# Patient Record
Sex: Female | Born: 1945
Health system: Southern US, Community
[De-identification: ages and names within clinical notes are randomized; demographics above are authoritative.]

## PROBLEM LIST (undated history)

## (undated) DIAGNOSIS — R945 Abnormal results of liver function studies: Secondary | ICD-10-CM

## (undated) DIAGNOSIS — I10 Essential (primary) hypertension: Secondary | ICD-10-CM

## (undated) DIAGNOSIS — R7989 Other specified abnormal findings of blood chemistry: Secondary | ICD-10-CM

## (undated) DIAGNOSIS — E785 Hyperlipidemia, unspecified: Secondary | ICD-10-CM

## (undated) DIAGNOSIS — E119 Type 2 diabetes mellitus without complications: Secondary | ICD-10-CM

## (undated) DIAGNOSIS — I219 Acute myocardial infarction, unspecified: Secondary | ICD-10-CM

## (undated) DIAGNOSIS — D649 Anemia, unspecified: Secondary | ICD-10-CM

## (undated) DIAGNOSIS — I82409 Acute embolism and thrombosis of unspecified deep veins of unspecified lower extremity: Secondary | ICD-10-CM

## (undated) DIAGNOSIS — R413 Other amnesia: Secondary | ICD-10-CM

## (undated) DIAGNOSIS — I2510 Atherosclerotic heart disease of native coronary artery without angina pectoris: Secondary | ICD-10-CM

## (undated) HISTORY — DX: Acute embolism and thrombosis of unspecified deep veins of unspecified lower extremity: I82.409

## (undated) HISTORY — DX: Other specified abnormal findings of blood chemistry: R79.89

## (undated) HISTORY — DX: Other amnesia: R41.3

## (undated) HISTORY — DX: Essential (primary) hypertension: I10

## (undated) HISTORY — DX: Anemia, unspecified: D64.9

## (undated) HISTORY — DX: Abnormal results of liver function studies: R94.5

## (undated) HISTORY — DX: Hyperlipidemia, unspecified: E78.5

## (undated) HISTORY — PX: CATARACT EXTRACTION: SUR2

## (undated) HISTORY — DX: Type 2 diabetes mellitus without complications: E11.9

## (undated) HISTORY — DX: Atherosclerotic heart disease of native coronary artery without angina pectoris: I25.10

---

## 2003-04-26 ENCOUNTER — Ambulatory Visit (HOSPITAL_COMMUNITY): Admission: RE | Admit: 2003-04-26 | Discharge: 2003-04-27 | Payer: Self-pay | Admitting: Cardiovascular Disease

## 2003-04-26 HISTORY — PX: CORONARY ANGIOPLASTY WITH STENT PLACEMENT: SHX49

## 2004-02-29 ENCOUNTER — Emergency Department (HOSPITAL_COMMUNITY): Admission: EM | Admit: 2004-02-29 | Discharge: 2004-02-29 | Payer: Self-pay | Admitting: Emergency Medicine

## 2004-03-19 ENCOUNTER — Encounter: Admission: RE | Admit: 2004-03-19 | Discharge: 2004-03-19 | Payer: Self-pay | Admitting: Internal Medicine

## 2005-01-29 ENCOUNTER — Ambulatory Visit (HOSPITAL_COMMUNITY): Admission: RE | Admit: 2005-01-29 | Discharge: 2005-01-29 | Payer: Self-pay | Admitting: *Deleted

## 2010-09-30 ENCOUNTER — Ambulatory Visit: Payer: Self-pay | Admitting: Cardiovascular Disease

## 2011-02-19 ENCOUNTER — Other Ambulatory Visit: Payer: Self-pay | Admitting: Cardiovascular Disease

## 2011-02-19 DIAGNOSIS — E785 Hyperlipidemia, unspecified: Secondary | ICD-10-CM

## 2011-02-19 NOTE — Telephone Encounter (Signed)
Fax received from pharmacy. Refill completed. Jodette Elizah Mierzwa RN  

## 2011-02-26 ENCOUNTER — Other Ambulatory Visit: Payer: Self-pay | Admitting: Internal Medicine

## 2011-02-26 DIAGNOSIS — Z1231 Encounter for screening mammogram for malignant neoplasm of breast: Secondary | ICD-10-CM

## 2011-03-14 ENCOUNTER — Ambulatory Visit: Payer: Self-pay

## 2011-03-21 NOTE — Cardiovascular Report (Signed)
NAMEHUDSON, [name] [name] NO.:  [id number]   MEDICAL RECORD NO.:  [id number]                   PATIENT TYPE:  OIB   LOCATION:  2899                                 FACILITY:  [facility]   PHYSICIAN:  Vesta Mixer, M.D.              DATE OF BIRTH:  1946-08-07   DATE OF PROCEDURE:  04/26/2003  DATE OF DISCHARGE:                              CARDIAC CATHETERIZATION   PROCEDURE:  Left heart catheterization with PTCA and stenting of the left  anterior descending artery as well as PTCA and stenting of the left  circumflex artery.   INDICATIONS:  The patient is a 65 year old female with a recent diagnosis of  coronary artery disease as well as diabetes mellitus.  She was recently  found to have significant coronary artery disease when she presented to the  hospital in [place] with an acute inferior wall myocardial infarction.  She had successful PTCA and stenting of her right coronary artery, but was  found to have significant lesions in the LAD and left circumflex artery.  She returns now for follow-up intervention of these two remaining lesions.   PROCEDURE:  The right femoral artery was easily cannulated using a modified  Seldinger technique.   HEMODYNAMICS:  The left ventricular pressure was 116/3 with an aortic  pressure of 118/61.   ANGIOGRAPHY:  The left main coronary artery is normal.   The left anterior descending artery is fairly normal in the proximal  segment.  The mid segment has a 60-70% diffuse stenosis.  The stenosis is  somewhat improved compared to her acute catheterization one month ago.  The  distal LAD has only minor luminal irregularities, but is basically normal.  There are several diagonal branches which have minor irregularities.  The  first diagonal branch has a 30-40% stenosis which does not appear to  obstruct blood flow.   The left circumflex artery is a moderate sized vessel and gives off a large  obtuse marginal artery.   There is a 70-80% stenosis in the obtuse marginal  artery.   The right coronary artery is widely patent.  The mid RCA stent is widely  patent and there is TIMI grade 3 flow down the right coronary artery.   LEFT VENTRICULOGRAM:  The left ventriculogram was performed in a 30 RAO  position.  It reveals normal left ventricular systolic function.  The  ejection fraction is around 65%.  There is no mitral regurgitation.   ANGIOPLASTY:  The 6-French sheath was traded out for a 7-French sheath.  The  left main was engaged using a Judkins FL3.5 guide.  Weight based Angiomax  was given.   Patriot guidewire was positioned in the distal left anterior descending  artery.  A 3.0 x 20 mm Taxus stent was placed in the mid left anterior  descending artery.  It was deployed at 11 atmospheres for 36 seconds.  This  resulted in  a very nice angiographic result with no evidence of edge  dissection.  There was a nice step up and step down on either side of the  stent.   At this point the balloon and wire were removed.  The wire was placed down  the circumflex obtuse marginal artery.  Following this a 3.0 x 16 mm Taxus  stent was positioned across the stenosis.  It was deployed at 12 atmospheres  for 29 seconds.  This resulted in a very nice angiographic result with TIMI  grade 3 flow.  There were no edge dissections.   COMPLICATIONS:  None.   CONCLUSION:  Successful PTCA and stenting of the left anterior descending as  well as successful PTCA and stenting of the left circumflex artery.  She has  a nice mid term result on her right coronary artery stenting from April.  She will go to the [facility] and we will anticipate discharge tomorrow.                                               Vesta Mixer, M.D.    PJN/MEDQ  D:  04/26/2003  T:  04/26/2003  Job:  [id number]  Gwenith Daily, M.D.  [place]   cc:   Gwenith Daily, M.D.  [employer]

## 2011-03-21 NOTE — Discharge Summary (Signed)
NAMENOEL, [name] NO.:  [id number]   MEDICAL RECORD NO.:  [id number]                   PATIENT TYPE:  OIB   LOCATION:  6533                                 FACILITY:  [facility]   PHYSICIAN:  Vesta Mixer, M.D.              DATE OF BIRTH:  [birth year]   DATE OF ADMISSION:  04/26/2003  DATE OF DISCHARGE:  04/27/2003                                 DISCHARGE SUMMARY   DISCHARGE DIAGNOSES:  1. Coronary artery disease--status post percutaneous transluminal coronary     angioplasty and stenting of the left anterior descending artery and the     left circumflex artery.  2. Recent percutaneous transluminal coronary angioplasty and stenting of the     right coronary artery following an inferior wall myocardial infarction.  3. Recent diagnosis of diabetes mellitus.  4. Dyslipidemia.   DISCHARGE MEDICATIONS:  1. Metoprolol 25 mg p.o. b.i.d.  2. Plavix 75 mg a day.  3. Aspirin 325 mg a day.  4. Tricor 160 mg a day.  5. Insulin as prescribed by Dr. Timothy Lasso.  6. Actos as prescribed by Dr. Timothy Lasso.  7. Nitroglycerin as needed.   DISPOSITION:  The patient will see Dr. Elease Hashimoto in one week.  She is to see  Dr. Timothy Lasso as needed for her diabetes mellitus.   HISTORY:  [name] is a 65 year old female with a recent diagnosis of  diabetes mellitus.  She was visiting in [place], [place] several  months ago and had an acute inferior wall myocardial infarction.  She had  successful PTCA and stenting of her right coronary artery at that time.  She  was found to have a tight left circumflex artery and left anterior  descending artery blockage.  She returned yesterday for followup  interventions.  Please see dictated H&P for further details.   HOSPITAL COURSE:  Coronary artery disease.  The patient was taken to the  catheterization lab and had a successful percutaneous transluminal coronary  angioplasty and stenting of her left anterior descending artery using  a 3.0  x 20 mm TAXUS stent.  She then had successful PTCA and stenting of her left  circumflex artery using a 3.0 x 16 mm TAXUS stent.  She tolerated the  procedure quite well and had 0% residual stenosis.  The postprocedural EKG  was unremarkable.  She did not have any further episodes of chest pain.  She  received Angiomax during the  procedure and had no episodes of bleeding.  She is now discharged in  satisfactory condition.  All of her other medical problems are stable.  We  will be holding her Altace because of some mild hypotension.  We will  address that further as an outpatient.  Vesta Mixer, M.D.    PJN/MEDQ  D:  04/27/2003  T:  04/28/2003  Job:  [id number]   cc:   Gwen Pounds, M.D.  [address]  [place]  [place] [address]  Fax: [phone]    cc:   Gwen Pounds, M.D.  [address]  [place]  [place] [address]  Fax: [phone]

## 2011-03-21 NOTE — H&P (Signed)
NAME:  [name] [name], [name] [name] NO.:  [id number]   MEDICAL RECORD NO.:  [id number]                  PATIENT TYPE:  OIB   LOCATION:                                       FACILITY:  [facility]   PHYSICIAN:  Vesta Mixer, M.D.              DATE OF BIRTH:  01/27/1946   DATE OF ADMISSION:  04/26/2003  DATE OF DISCHARGE:                                HISTORY & PHYSICAL   HISTORY OF PRESENT ILLNESS:  [name] [name] is a middle-aged female with a  recent diagnosis of coronary artery disease.  She was visiting [place] and  had an acute inferior wall myocardial infarction.  She had successful PTCA  and stenting of her right coronary artery.  At that time, she was noted to  have two other significant lesions involving her LAD and circumflex.  She  has done fairly well, but she is now admitted for followup heart  catheterization for further management of her coronary artery disease.   The patient had been in relatively good health.  She has not had any  significant problems until she presented with her acute inferior wall  myocardial infarction.  She has not had any episodes of chest pain or  shortness of breath since that time.  We received the CD-ROM of her heart  catheterization.  It revealed that she did in fact have significant disease  involving her LAD and her circumflex vessel.   CURRENT MEDICATIONS:  1. Enteric coated aspirin 325 mg daily.  2. Plavix 75 mg daily.  3. Metoprolol 50 mg b.i.d.  4. Altace 2.5 mg daily.  5. TriCor 160 mg daily.  6. Actos 30 mg daily.  7. Lantus insulin 18 units q.h.s.   PAST MEDICAL HISTORY:  1. Hypertension.  2. Diabetes mellitus.  3. Hyperlipidemia.   SOCIAL HISTORY:  The patient drinks wine occasionally.  She does not smoke.   FAMILY HISTORY:  Her father died of a myocardial infarction.  Her mother had  two myocardial infarctions, and ended dying of an aneurysm.   REVIEW OF SYSTEMS:  Reviewed and is essentially  negative.   PHYSICAL EXAMINATION:  GENERAL:  She is a middle-aged female in no acute  distress.  She is alert and oriented x3.  Her mood and affect are normal.  VITAL SIGNS:  Her weight is 156.  Her blood pressure is 120/70, heart rate  60.  HEENT:  Two plus carotids.  She has no bruits.  There is no JVD, no  thyromegaly.  LUNGS:  Clear to auscultation.  HEART:  Regular rate, S1 and S2, no murmurs, rubs, or gallops.  ABDOMEN:  Good bowel sounds, nontender.  EXTREMITIES:  No cyanosis, clubbing, or edema.  NEUROLOGIC:  Nonfocal.   Overall, [name] [name] is fairly well.  She does have significant disease  involving her LAD and her left circumflex artery.  We will admit her for  heart catheterization for further evaluation of her LAD and circumflex  vessel.  I suspect that she will need further PTCA and stenting of these  areas.  We had a long discussion with her and her daughter.  She understands  and agrees to proceed.                                               Vesta Mixer, M.D.    PJN/MEDQ  D:  04/24/2003  T:  04/25/2003  Job:  [id number]   cc:   Johnnette Barrios, M.D.  [place], [place]   Jeannett Senior A. Evlyn Kanner, M.D.  [address]  [place]  [place] [address]  Fax: [phone]    cc:   Johnnette Barrios, M.D.  [place], [place]   Jeannett Senior A. Evlyn Kanner, M.D.  [address]  [place]  [place] [address]  Fax: [phone]

## 2011-04-22 ENCOUNTER — Encounter: Payer: Self-pay | Admitting: Cardiovascular Disease

## 2011-04-28 ENCOUNTER — Ambulatory Visit (INDEPENDENT_AMBULATORY_CARE_PROVIDER_SITE_OTHER): Payer: 59 | Admitting: Cardiovascular Disease

## 2011-04-28 ENCOUNTER — Encounter: Payer: Self-pay | Admitting: Cardiovascular Disease

## 2011-04-28 VITALS — BP 140/82 | HR 64 | Ht 62.0 in | Wt 163.4 lb

## 2011-04-28 DIAGNOSIS — I2510 Atherosclerotic heart disease of native coronary artery without angina pectoris: Secondary | ICD-10-CM | POA: Insufficient documentation

## 2011-04-28 NOTE — Progress Notes (Signed)
[name] [name] Date of Birth  05-08-1946 [facility] [address]     [address] [place], [place]  [address] [phone]  Fax  [phone]  History of Present Illness:  Hx of CAD - s/p stenting of LCx.  Hx of hyperlipidemia and HTN.  Under lots of stress with her husband who has bipolar disease.  He has been manic for the past several weeks.  No angina.  No dyspnea.   Current Outpatient Prescriptions on File Prior to Visit  Medication Sig Dispense Refill  . aspirin 325 MG EC tablet Take 325 mg by mouth daily.        . Calcium Carbonate-Vitamin D (CALCIUM + D PO) Take by mouth daily.        . fenofibrate micronized (LOFIBRA) 200 MG capsule TAKE ONE CAPSULE BY MOUTH EVERY DAY  30 capsule  11  . fish oil-omega-3 fatty acids 1000 MG capsule Take 3 g by mouth daily.        . metFORMIN (GLUCOPHAGE) 500 MG tablet Take 500 mg by mouth 2 (two) times daily with a meal.        . metoprolol tartrate (LOPRESSOR) 25 MG tablet Take 25 mg by mouth 2 (two) times daily.        . nitroGLYCERIN (NITROSTAT) 0.4 MG SL tablet Place 0.4 mg under the tongue every 5 (five) minutes as needed.        . rosuvastatin (CRESTOR) 20 MG tablet Take 20 mg by mouth daily.          Allergies  Allergen Reactions  . Codeine   . Ibuprofen   . Latex   . Penicillins     Past Medical History  Diagnosis Date  . Coronary artery disease   . Hyperlipidemia   . Hypertension   . Diabetes mellitus     Past Surgical History  Procedure Date  . Coronary angioplasty with stent placement 04/26/2003    NORMAL. EF 65%  . Cesarean section     History  Smoking status  . Never Smoker   Smokeless tobacco  . Not on file    History  Alcohol Use No    Family History  Problem Relation Age of Onset  . Heart attack Mother     X2  . Aneurysm Mother   . Heart attack Father     Reviw of Systems:  Reviewed in the HPI.  All other systems are negative.  Physical Exam: BP  140/82  Pulse 64  Ht 5\' 2"  (1.575 m)  Wt 163 lb 6.4 oz (74.118 kg)  BMI 29.89 kg/m2 The patient is alert and oriented x 3.  The mood and affect are normal.   Skin: warm and dry.  Color is normal.    HEENT:   the sclera are nonicteric.  The mucous membranes are moist.  The carotids are 2+ without bruits.  There is no thyromegaly.  There is no JVD.    Lungs: clear.  The chest wall is non tender.    Heart: regular rate with a normal S1 and S2.  There are no murmurs, gallops, or rubs. The PMI is not displaced.     Abdomin: good bowel sounds.  There is no guarding or rebound.  There is no hepatosplenomegaly or tenderness.  There are no masses.   Extremities:  no clubbing, cyanosis, or edema.  The legs are without rashes.  The distal pulses are intact.   Neuro:  Cranial nerves  II - XII are intact.  Motor and sensory functions are intact.    The gait is normal.  ECG:  Assessment / Plan:

## 2011-04-28 NOTE — Assessment & Plan Note (Signed)
[name] [name] is doing very well. She's not had any episodes of angina. We'll continue with her same medications.

## 2011-08-05 ENCOUNTER — Other Ambulatory Visit: Payer: Self-pay | Admitting: Cardiovascular Disease

## 2011-11-26 ENCOUNTER — Ambulatory Visit (INDEPENDENT_AMBULATORY_CARE_PROVIDER_SITE_OTHER): Payer: Medicare Other | Admitting: Cardiovascular Disease

## 2011-11-26 ENCOUNTER — Encounter: Payer: Self-pay | Admitting: Cardiovascular Disease

## 2011-11-26 DIAGNOSIS — E785 Hyperlipidemia, unspecified: Secondary | ICD-10-CM | POA: Diagnosis not present

## 2011-11-26 DIAGNOSIS — I10 Essential (primary) hypertension: Secondary | ICD-10-CM | POA: Diagnosis not present

## 2011-11-26 DIAGNOSIS — I2510 Atherosclerotic heart disease of native coronary artery without angina pectoris: Secondary | ICD-10-CM

## 2011-11-26 NOTE — Progress Notes (Signed)
[name] [name] Date of Birth  Mar 01, 1946 [facility]     [place] [facility]  [address]    [address]   [address] [place], [place]  [address]    [place], [place]  [address] [phone]  Fax  [phone]  [phone]  Fax [phone]   History of Present Illness:  Problem list: 1. Coronary artery disease-status post PTCA and stenting of her left complex artery. She status post PTCA and stenting of her right coronary artery in [place] in 2005. 2. Hyperlipidemia 3. Hypertension  [name] [name] is a 66 y.o. female with the above noted hx.  She has had lots of problems with her husband who has bipolar disease ( will not take his meds).  Otherwise she is doing well.   She is still walking some but needs to get back into it  Current Outpatient Prescriptions on File Prior to Visit  Medication Sig Dispense Refill  . aspirin 325 MG EC tablet Take 325 mg by mouth daily.        . Calcium Carbonate-Vitamin D (CALCIUM + D PO) Take by mouth daily.        . CRESTOR 20 MG tablet TAKE 1 TABLET BY MOUTH EVERY DAY  30 tablet  6  . fenofibrate micronized (LOFIBRA) 200 MG capsule TAKE ONE CAPSULE BY MOUTH EVERY DAY  30 capsule  11  . fish oil-omega-3 fatty acids 1000 MG capsule Take 3 g by mouth daily.        . metFORMIN (GLUCOPHAGE) 500 MG tablet Take 500 mg by mouth 2 (two) times daily with a meal.        . metoprolol tartrate (LOPRESSOR) 25 MG tablet Take 25 mg by mouth 2 (two) times daily.        . nitroGLYCERIN (NITROSTAT) 0.4 MG SL tablet Place 0.4 mg under the tongue every 5 (five) minutes as needed.          Allergies  Allergen Reactions  . Codeine   . Ibuprofen   . Latex   . Penicillins     Past Medical History  Diagnosis Date  . Coronary artery disease     s/p stent to LAD and LCx  . Hyperlipidemia   . Hypertension   . Diabetes mellitus     Past Surgical History  Procedure Date  . Coronary angioplasty with stent placement 04/26/2003    NORMAL. EF 65%  . Cesarean  section     History  Smoking status  . Never Smoker   Smokeless tobacco  . Not on file    History  Alcohol Use No    Family History  Problem Relation Age of Onset  . Heart attack Mother     X2  . Aneurysm Mother   . Heart attack Father     Reviw of Systems:  Reviewed in the HPI.  All other systems are negative.  Physical Exam: BP 152/76  Pulse 56  Ht 5\' 2"  (1.575 m)  Wt 161 lb 12.8 oz (73.392 kg)  BMI 29.59 kg/m2 The patient is alert and oriented x 3.  The mood and affect are normal.   Skin: warm and dry.  Color is normal.    HEENT:   Normocephalic/atraumatic. Normal carotids. No JVD. Because membranes are moist. Neck is supple.  Lungs: Lungs are clear to auscultation.   Heart: Regular rate S1-S2.    Abdomen: Her abdomen is benign. She has good bowel sounds. There is no hepatosplenomegaly.  Extremities:  No clubbing cyanosis or edema  Neuro:  Exam.    ECG: Sinus bradycardia. She has low voltage. EKG is otherwise normal.  Assessment / Plan:

## 2011-11-26 NOTE — Patient Instructions (Signed)
Your physician wants you to follow-up in: 6 months  You will receive a reminder letter in the mail two months in advance. If you don't receive a letter, please call our office to schedule the follow-up appointment.  Your physician recommends that you return for a FASTING lipid profile: 6 months   

## 2011-11-26 NOTE — Assessment & Plan Note (Signed)
[name] [name] is doing well. She's not had any episodes of angina. She does have some chest tightness when she walks medical but I think that this is a pulmonary issue. I've asked her to exercise on regular basis. I'll see her again in 6 months.

## 2012-02-24 DIAGNOSIS — I10 Essential (primary) hypertension: Secondary | ICD-10-CM | POA: Diagnosis not present

## 2012-02-24 DIAGNOSIS — M899 Disorder of bone, unspecified: Secondary | ICD-10-CM | POA: Diagnosis not present

## 2012-02-24 DIAGNOSIS — E785 Hyperlipidemia, unspecified: Secondary | ICD-10-CM | POA: Diagnosis not present

## 2012-02-24 DIAGNOSIS — M949 Disorder of cartilage, unspecified: Secondary | ICD-10-CM | POA: Diagnosis not present

## 2012-02-24 DIAGNOSIS — E119 Type 2 diabetes mellitus without complications: Secondary | ICD-10-CM | POA: Diagnosis not present

## 2012-02-24 DIAGNOSIS — R82998 Other abnormal findings in urine: Secondary | ICD-10-CM | POA: Diagnosis not present

## 2012-03-02 DIAGNOSIS — E119 Type 2 diabetes mellitus without complications: Secondary | ICD-10-CM | POA: Diagnosis not present

## 2012-03-02 DIAGNOSIS — Z0000 Encounter for general adult medical examination without abnormal findings: Secondary | ICD-10-CM | POA: Diagnosis not present

## 2012-03-02 DIAGNOSIS — Z1212 Encounter for screening for malignant neoplasm of rectum: Secondary | ICD-10-CM | POA: Diagnosis not present

## 2012-03-02 DIAGNOSIS — M949 Disorder of cartilage, unspecified: Secondary | ICD-10-CM | POA: Diagnosis not present

## 2012-03-02 DIAGNOSIS — M899 Disorder of bone, unspecified: Secondary | ICD-10-CM | POA: Diagnosis not present

## 2012-03-02 DIAGNOSIS — I2510 Atherosclerotic heart disease of native coronary artery without angina pectoris: Secondary | ICD-10-CM | POA: Diagnosis not present

## 2012-03-26 ENCOUNTER — Ambulatory Visit
Admission: RE | Admit: 2012-03-26 | Discharge: 2012-03-26 | Disposition: A | Discharge status: Home / Self Care | Payer: Medicare Other | Source: Ambulatory Visit | Attending: Internal Medicine | Admitting: Internal Medicine

## 2012-03-26 DIAGNOSIS — Z1231 Encounter for screening mammogram for malignant neoplasm of breast: Secondary | ICD-10-CM | POA: Diagnosis not present

## 2012-04-09 ENCOUNTER — Other Ambulatory Visit: Payer: Self-pay | Admitting: *Deleted

## 2012-04-09 ENCOUNTER — Other Ambulatory Visit: Payer: Self-pay | Admitting: Cardiovascular Disease

## 2012-04-09 DIAGNOSIS — E785 Hyperlipidemia, unspecified: Secondary | ICD-10-CM

## 2012-04-09 MED ORDER — FENOFIBRATE MICRONIZED 200 MG PO CAPS: 200.0000 mg | ORAL_CAPSULE | Freq: Every day | ORAL | Status: DC

## 2012-04-09 NOTE — Telephone Encounter (Signed)
Pt walked in requesting fill of fenofibrate, refill completed

## 2012-04-14 DIAGNOSIS — H40019 Open angle with borderline findings, low risk, unspecified eye: Secondary | ICD-10-CM | POA: Diagnosis not present

## 2012-04-14 DIAGNOSIS — H524 Presbyopia: Secondary | ICD-10-CM | POA: Diagnosis not present

## 2012-04-14 DIAGNOSIS — H04129 Dry eye syndrome of unspecified lacrimal gland: Secondary | ICD-10-CM | POA: Diagnosis not present

## 2012-04-14 DIAGNOSIS — IMO0001 Reserved for inherently not codable concepts without codable children: Secondary | ICD-10-CM | POA: Diagnosis not present

## 2012-06-22 ENCOUNTER — Encounter (HOSPITAL_COMMUNITY): Payer: Self-pay | Admitting: Anesthesiology

## 2012-06-22 ENCOUNTER — Other Ambulatory Visit: Payer: Self-pay | Admitting: Orthopedic Surgery

## 2012-06-22 ENCOUNTER — Ambulatory Visit (HOSPITAL_COMMUNITY): Payer: Medicare Other

## 2012-06-22 ENCOUNTER — Encounter (HOSPITAL_COMMUNITY): Payer: Self-pay | Admitting: Emergency Medicine

## 2012-06-22 ENCOUNTER — Encounter (HOSPITAL_COMMUNITY)
Admission: EM | Disposition: A | Discharge status: Home / Self Care | Payer: Self-pay | Source: Home / Self Care | Attending: Orthopedic Surgery

## 2012-06-22 ENCOUNTER — Inpatient Hospital Stay (HOSPITAL_COMMUNITY)
Admission: EM | Admit: 2012-06-22 | Discharge: 2012-06-23 | DRG: 603 | Disposition: A | Discharge status: Home / Self Care | Payer: Medicare Other | Attending: Orthopedic Surgery | Admitting: Orthopedic Surgery

## 2012-06-22 ENCOUNTER — Ambulatory Visit (HOSPITAL_COMMUNITY): Payer: Medicare Other | Admitting: Anesthesiology

## 2012-06-22 DIAGNOSIS — I252 Old myocardial infarction: Secondary | ICD-10-CM | POA: Diagnosis not present

## 2012-06-22 DIAGNOSIS — E119 Type 2 diabetes mellitus without complications: Secondary | ICD-10-CM | POA: Diagnosis present

## 2012-06-22 DIAGNOSIS — R918 Other nonspecific abnormal finding of lung field: Secondary | ICD-10-CM | POA: Diagnosis not present

## 2012-06-22 DIAGNOSIS — E785 Hyperlipidemia, unspecified: Secondary | ICD-10-CM | POA: Diagnosis present

## 2012-06-22 DIAGNOSIS — L02519 Cutaneous abscess of unspecified hand: Secondary | ICD-10-CM | POA: Diagnosis not present

## 2012-06-22 DIAGNOSIS — Z9861 Coronary angioplasty status: Secondary | ICD-10-CM | POA: Diagnosis not present

## 2012-06-22 DIAGNOSIS — I2510 Atherosclerotic heart disease of native coronary artery without angina pectoris: Secondary | ICD-10-CM | POA: Diagnosis present

## 2012-06-22 DIAGNOSIS — I10 Essential (primary) hypertension: Secondary | ICD-10-CM | POA: Diagnosis present

## 2012-06-22 DIAGNOSIS — Z880 Allergy status to penicillin: Secondary | ICD-10-CM

## 2012-06-22 DIAGNOSIS — Z01811 Encounter for preprocedural respiratory examination: Secondary | ICD-10-CM | POA: Diagnosis not present

## 2012-06-22 DIAGNOSIS — Z888 Allergy status to other drugs, medicaments and biological substances status: Secondary | ICD-10-CM

## 2012-06-22 DIAGNOSIS — Z8249 Family history of ischemic heart disease and other diseases of the circulatory system: Secondary | ICD-10-CM

## 2012-06-22 DIAGNOSIS — D72829 Elevated white blood cell count, unspecified: Secondary | ICD-10-CM | POA: Diagnosis not present

## 2012-06-22 DIAGNOSIS — L03119 Cellulitis of unspecified part of limb: Secondary | ICD-10-CM | POA: Diagnosis present

## 2012-06-22 DIAGNOSIS — R509 Fever, unspecified: Secondary | ICD-10-CM | POA: Diagnosis not present

## 2012-06-22 DIAGNOSIS — IMO0002 Reserved for concepts with insufficient information to code with codable children: Secondary | ICD-10-CM | POA: Diagnosis not present

## 2012-06-22 HISTORY — PX: I & D EXTREMITY: SHX5045

## 2012-06-22 HISTORY — DX: Acute myocardial infarction, unspecified: I21.9

## 2012-06-22 LAB — CBC WITH DIFFERENTIAL/PLATELET
Basophils Absolute: 0 10*3/uL (ref 0.0–0.1)
Eosinophils Absolute: 0 10*3/uL (ref 0.0–0.7)
Eosinophils Relative: 0 % (ref 0–5)
HCT: 38.1 % (ref 36.0–46.0)
Lymphocytes Relative: 2 % — ABNORMAL LOW (ref 12–46)
MCV: 93.2 fL (ref 78.0–100.0)
Monocytes Absolute: 1 10*3/uL (ref 0.1–1.0)
Neutro Abs: 17.7 10*3/uL — ABNORMAL HIGH (ref 1.7–7.7)
Neutrophils Relative %: 92 % — ABNORMAL HIGH (ref 43–77)
Platelets: 371 10*3/uL (ref 150–400)
RDW: 14.1 % (ref 11.5–15.5)

## 2012-06-22 LAB — BASIC METABOLIC PANEL
BUN: 17 mg/dL (ref 6–23)
CO2: 24 mEq/L (ref 19–32)
Chloride: 99 mEq/L (ref 96–112)
Creatinine, Ser: 0.78 mg/dL (ref 0.50–1.10)

## 2012-06-22 LAB — SURGICAL PCR SCREEN: Staphylococcus aureus: POSITIVE — AB

## 2012-06-22 LAB — GLUCOSE, CAPILLARY: Glucose-Capillary: 220 mg/dL — ABNORMAL HIGH (ref 70–99)

## 2012-06-22 SURGERY — IRRIGATION AND DEBRIDEMENT EXTREMITY
Anesthesia: General | Site: Hand | Laterality: Left | Wound class: Clean

## 2012-06-22 MED ORDER — FENTANYL CITRATE 0.05 MG/ML IJ SOLN
INTRAMUSCULAR | Status: DC | PRN
  Administered 2012-06-22 (×2): 50 ug via INTRAVENOUS

## 2012-06-22 MED ORDER — SODIUM CHLORIDE 0.9 % IR SOLN
Status: DC | PRN
  Administered 2012-06-22: 1

## 2012-06-22 MED ORDER — ONDANSETRON HCL 4 MG/2ML IJ SOLN: 4.0000 mg | Freq: Once | INTRAMUSCULAR | Status: DC | PRN

## 2012-06-22 MED ORDER — LACTATED RINGERS IV SOLN
INTRAVENOUS | Status: DC
  Administered 2012-06-22: 1000 mL via INTRAVENOUS
  Administered 2012-06-23: 13:00:00 via INTRAVENOUS

## 2012-06-22 MED ORDER — HYDROMORPHONE HCL PF 1 MG/ML IJ SOLN: 0.5000 mg | INTRAMUSCULAR | Status: DC | PRN

## 2012-06-22 MED ORDER — ONDANSETRON HCL 4 MG PO TABS: 4.0000 mg | ORAL_TABLET | Freq: Four times a day (QID) | ORAL | Status: DC | PRN

## 2012-06-22 MED ORDER — ZOLPIDEM TARTRATE 5 MG PO TABS: 5.0000 mg | ORAL_TABLET | Freq: Every evening | ORAL | Status: DC | PRN

## 2012-06-22 MED ORDER — MIDAZOLAM HCL 5 MG/5ML IJ SOLN
INTRAMUSCULAR | Status: DC | PRN
  Administered 2012-06-22: 1 mg via INTRAVENOUS

## 2012-06-22 MED ORDER — METFORMIN HCL 500 MG PO TABS
500.0000 mg | ORAL_TABLET | Freq: Two times a day (BID) | ORAL | Status: DC
  Administered 2012-06-23 (×2): 500 mg via ORAL
  Filled 2012-06-22 (×3): qty 1

## 2012-06-22 MED ORDER — PROPOFOL 10 MG/ML IV BOLUS
INTRAVENOUS | Status: DC | PRN
  Administered 2012-06-22: 110 mg via INTRAVENOUS

## 2012-06-22 MED ORDER — VANCOMYCIN HCL 1000 MG IV SOLR
1000.0000 mg | INTRAVENOUS | Status: DC | PRN
  Administered 2012-06-22: 1000 mg via INTRAVENOUS

## 2012-06-22 MED ORDER — ONDANSETRON HCL 4 MG/2ML IJ SOLN: 4.0000 mg | Freq: Four times a day (QID) | INTRAMUSCULAR | Status: DC | PRN

## 2012-06-22 MED ORDER — METHOCARBAMOL 500 MG PO TABS: 500.0000 mg | ORAL_TABLET | Freq: Four times a day (QID) | ORAL | Status: DC | PRN

## 2012-06-22 MED ORDER — METOPROLOL TARTRATE 12.5 MG HALF TABLET
12.5000 mg | ORAL_TABLET | Freq: Two times a day (BID) | ORAL | Status: DC
  Administered 2012-06-23: 12.5 mg via ORAL
  Filled 2012-06-22 (×2): qty 1

## 2012-06-22 MED ORDER — METHOCARBAMOL 100 MG/ML IJ SOLN
500.0000 mg | Freq: Four times a day (QID) | INTRAVENOUS | Status: DC | PRN
  Filled 2012-06-22: qty 5

## 2012-06-22 MED ORDER — CALCIUM CARBONATE-VITAMIN D 250-125 MG-UNIT PO TABS: 1.0000 | ORAL_TABLET | Freq: Every day | ORAL | Status: DC

## 2012-06-22 MED ORDER — ACETAMINOPHEN 10 MG/ML IV SOLN: 1000.0000 mg | Freq: Once | INTRAVENOUS | Status: DC | PRN

## 2012-06-22 MED ORDER — MUPIROCIN 2 % EX OINT
TOPICAL_OINTMENT | CUTANEOUS | Status: AC
  Administered 2012-06-22: 1 via NASAL
  Filled 2012-06-22: qty 22

## 2012-06-22 MED ORDER — DIPHENHYDRAMINE HCL 12.5 MG/5ML PO ELIX: 12.5000 mg | ORAL_SOLUTION | ORAL | Status: DC | PRN

## 2012-06-22 MED ORDER — MUPIROCIN 2 % EX OINT
TOPICAL_OINTMENT | Freq: Two times a day (BID) | CUTANEOUS | Status: DC
  Administered 2012-06-22: 1 via NASAL
  Filled 2012-06-22: qty 22

## 2012-06-22 MED ORDER — SUCCINYLCHOLINE CHLORIDE 20 MG/ML IJ SOLN
INTRAMUSCULAR | Status: DC | PRN
  Administered 2012-06-22: 100 mg via INTRAVENOUS

## 2012-06-22 MED ORDER — ATORVASTATIN CALCIUM 40 MG PO TABS
40.0000 mg | ORAL_TABLET | Freq: Every day | ORAL | Status: DC
  Administered 2012-06-23: 40 mg via ORAL
  Filled 2012-06-22: qty 1

## 2012-06-22 MED ORDER — BUPIVACAINE HCL (PF) 0.25 % IJ SOLN
INTRAMUSCULAR | Status: DC | PRN
  Administered 2012-06-22: 17 mL

## 2012-06-22 MED ORDER — ENOXAPARIN SODIUM 40 MG/0.4ML ~~LOC~~ SOLN
40.0000 mg | SUBCUTANEOUS | Status: DC
  Administered 2012-06-23: 40 mg via SUBCUTANEOUS
  Filled 2012-06-22: qty 0.4

## 2012-06-22 MED ORDER — HYDROMORPHONE HCL PF 1 MG/ML IJ SOLN: 0.2500 mg | INTRAMUSCULAR | Status: DC | PRN

## 2012-06-22 MED ORDER — ASPIRIN EC 325 MG PO TBEC
325.0000 mg | DELAYED_RELEASE_TABLET | Freq: Every day | ORAL | Status: DC
Start: 2012-06-23 — End: 2012-06-23
  Administered 2012-06-23: 325 mg via ORAL
  Filled 2012-06-22: qty 1

## 2012-06-22 MED ORDER — LIDOCAINE HCL (CARDIAC) 20 MG/ML IV SOLN
INTRAVENOUS | Status: DC | PRN
  Administered 2012-06-22: 30 mg via INTRAVENOUS

## 2012-06-22 MED ORDER — CALCIUM CARBONATE-VITAMIN D 500-200 MG-UNIT PO TABS
0.5000 | ORAL_TABLET | Freq: Every day | ORAL | Status: DC
  Administered 2012-06-23: 0.5 via ORAL
  Filled 2012-06-22: qty 0.5

## 2012-06-22 MED ORDER — NITROGLYCERIN 0.4 MG SL SUBL: 0.4000 mg | SUBLINGUAL_TABLET | SUBLINGUAL | Status: DC | PRN

## 2012-06-22 MED ORDER — SODIUM CHLORIDE 0.9 % IR SOLN
Status: DC | PRN
  Administered 2012-06-22: 3000 mL

## 2012-06-22 MED ORDER — ACETAMINOPHEN 10 MG/ML IV SOLN
INTRAVENOUS | Status: DC | PRN
  Administered 2012-06-22: 1000 mg via INTRAVENOUS

## 2012-06-22 MED ORDER — VANCOMYCIN HCL IN DEXTROSE 1-5 GM/200ML-% IV SOLN
INTRAVENOUS | Status: AC
  Filled 2012-06-22: qty 200

## 2012-06-22 MED ORDER — VANCOMYCIN HCL IN DEXTROSE 1-5 GM/200ML-% IV SOLN: 1000.0000 mg | INTRAVENOUS | Status: DC

## 2012-06-22 MED ORDER — OXYCODONE-ACETAMINOPHEN 5-325 MG PO TABS: 1.0000 | ORAL_TABLET | ORAL | Status: DC | PRN

## 2012-06-22 MED ORDER — ACETAMINOPHEN 10 MG/ML IV SOLN
INTRAVENOUS | Status: AC
  Filled 2012-06-22: qty 100

## 2012-06-22 MED ORDER — INSULIN ASPART 100 UNIT/ML ~~LOC~~ SOLN
0.0000 [IU] | Freq: Three times a day (TID) | SUBCUTANEOUS | Status: DC
  Administered 2012-06-23: 3 [IU] via SUBCUTANEOUS
  Administered 2012-06-23: 2 [IU] via SUBCUTANEOUS
  Administered 2012-06-23: 3 [IU] via SUBCUTANEOUS
  Filled 2012-06-22: qty 3

## 2012-06-22 MED ORDER — OMEGA-3 FATTY ACIDS 1000 MG PO CAPS: 3.0000 g | ORAL_CAPSULE | Freq: Every day | ORAL | Status: DC

## 2012-06-22 MED ORDER — LABETALOL HCL 5 MG/ML IV SOLN
INTRAVENOUS | Status: DC | PRN
  Administered 2012-06-22: 2.5 mg via INTRAVENOUS
  Administered 2012-06-22: 5 mg via INTRAVENOUS

## 2012-06-22 MED ORDER — FENOFIBRATE 160 MG PO TABS
160.0000 mg | ORAL_TABLET | Freq: Every day | ORAL | Status: DC
  Administered 2012-06-23: 160 mg via ORAL
  Filled 2012-06-22: qty 1

## 2012-06-22 MED ORDER — CHLORHEXIDINE GLUCONATE 4 % EX LIQD
60.0000 mL | Freq: Once | CUTANEOUS | Status: AC
  Administered 2012-06-22: 4 via TOPICAL

## 2012-06-22 MED ORDER — BUPIVACAINE HCL (PF) 0.25 % IJ SOLN
INTRAMUSCULAR | Status: AC
  Filled 2012-06-22: qty 30

## 2012-06-22 MED ORDER — SODIUM CHLORIDE 0.9 % IV SOLN
INTRAVENOUS | Status: DC | PRN
  Administered 2012-06-22 (×2): via INTRAVENOUS

## 2012-06-22 SURGICAL SUPPLY — 58 items
BANDAGE COBAN STERILE 2 (GAUZE/BANDAGES/DRESSINGS) IMPLANT
BANDAGE CONFORM 2  STR LF (GAUZE/BANDAGES/DRESSINGS) IMPLANT
BANDAGE ELASTIC 3 VELCRO ST LF (GAUZE/BANDAGES/DRESSINGS) ×2 IMPLANT
BANDAGE ELASTIC 4 VELCRO ST LF (GAUZE/BANDAGES/DRESSINGS) ×2 IMPLANT
BANDAGE GAUZE ELAST BULKY 4 IN (GAUZE/BANDAGES/DRESSINGS) ×2 IMPLANT
BNDG COHESIVE 1X5 TAN STRL LF (GAUZE/BANDAGES/DRESSINGS) IMPLANT
BNDG ESMARK 4X9 LF (GAUZE/BANDAGES/DRESSINGS) IMPLANT
CANISTER SUCTION 2500CC (MISCELLANEOUS) ×2 IMPLANT
CLOTH BEACON ORANGE TIMEOUT ST (SAFETY) ×2 IMPLANT
CORDS BIPOLAR (ELECTRODE) ×2 IMPLANT
COVER SURGICAL LIGHT HANDLE (MISCELLANEOUS) ×2 IMPLANT
CUFF TOURNIQUET SINGLE 18IN (TOURNIQUET CUFF) ×2 IMPLANT
DECANTER SPIKE VIAL GLASS SM (MISCELLANEOUS) ×4 IMPLANT
DRAIN PENROSE 1/4X12 LTX STRL (WOUND CARE) IMPLANT
DRAPE SURG 17X23 STRL (DRAPES) ×2 IMPLANT
DRSG ADAPTIC 3X8 NADH LF (GAUZE/BANDAGES/DRESSINGS) IMPLANT
DRSG EMULSION OIL 3X3 NADH (GAUZE/BANDAGES/DRESSINGS) IMPLANT
DRSG PAD ABDOMINAL 8X10 ST (GAUZE/BANDAGES/DRESSINGS) ×2 IMPLANT
GAUZE PACKING IODOFORM 1/4X5 (PACKING) ×2 IMPLANT
GAUZE XEROFORM 1X8 LF (GAUZE/BANDAGES/DRESSINGS) IMPLANT
GLOVE BIO SURGEON STRL SZ7.5 (GLOVE) IMPLANT
GLOVE BIOGEL PI IND STRL 8 (GLOVE) ×1 IMPLANT
GLOVE BIOGEL PI INDICATOR 8 (GLOVE) ×1
GLOVE SURG SS PI 7.5 STRL IVOR (GLOVE) ×2 IMPLANT
GLOVE SURG SS PI 8.5 STRL IVOR (GLOVE) ×2
GLOVE SURG SS PI 8.5 STRL STRW (GLOVE) ×2 IMPLANT
GOWN STRL REIN XL XLG (GOWN DISPOSABLE) ×2 IMPLANT
HANDPIECE INTERPULSE COAX TIP (DISPOSABLE)
KIT BASIN OR (CUSTOM PROCEDURE TRAY) ×2 IMPLANT
KIT ROOM TURNOVER OR (KITS) ×2 IMPLANT
LOOP VESSEL MAXI BLUE (MISCELLANEOUS) IMPLANT
LOOP VESSEL MINI RED (MISCELLANEOUS) IMPLANT
MANIFOLD NEPTUNE II (INSTRUMENTS) IMPLANT
NEEDLE HYPO 25GX1X1/2 BEV (NEEDLE) ×2 IMPLANT
NEEDLE HYPO 25X1 1.5 SAFETY (NEEDLE) IMPLANT
NS IRRIG 1000ML POUR BTL (IV SOLUTION) ×2 IMPLANT
PACK ORTHO EXTREMITY (CUSTOM PROCEDURE TRAY) ×2 IMPLANT
PAD ARMBOARD 7.5X6 YLW CONV (MISCELLANEOUS) ×4 IMPLANT
SCRUB BETADINE 4OZ XXX (MISCELLANEOUS) ×2 IMPLANT
SET CYSTO W/LG BORE CLAMP LF (SET/KITS/TRAYS/PACK) ×2 IMPLANT
SET HNDPC FAN SPRY TIP SCT (DISPOSABLE) IMPLANT
SOLUTION BETADINE 4OZ (MISCELLANEOUS) ×2 IMPLANT
SPONGE GAUZE 4X4 12PLY (GAUZE/BANDAGES/DRESSINGS) ×2 IMPLANT
SPONGE LAP 18X18 X RAY DECT (DISPOSABLE) IMPLANT
SPONGE LAP 4X18 X RAY DECT (DISPOSABLE) ×2 IMPLANT
SUCTION FRAZIER TIP 10 FR DISP (SUCTIONS) ×2 IMPLANT
SUT ETHILON 4 0 PS 2 18 (SUTURE) ×2 IMPLANT
SUT MON AB 5-0 P3 18 (SUTURE) IMPLANT
SWAB COLLECTION DEVICE MRSA (MISCELLANEOUS) ×2 IMPLANT
SYR CONTROL 10ML LL (SYRINGE) ×2 IMPLANT
TOWEL OR 17X24 6PK STRL BLUE (TOWEL DISPOSABLE) ×2 IMPLANT
TOWEL OR 17X26 10 PK STRL BLUE (TOWEL DISPOSABLE) ×2 IMPLANT
TUBE ANAEROBIC SPECIMEN COL (MISCELLANEOUS) ×4 IMPLANT
TUBE CONNECTING 12X1/4 (SUCTIONS) ×2 IMPLANT
TUBE FEEDING 5FR 15 INCH (TUBING) IMPLANT
UNDERPAD 30X30 INCONTINENT (UNDERPADS AND DIAPERS) ×2 IMPLANT
WATER STERILE IRR 1000ML POUR (IV SOLUTION) ×2 IMPLANT
YANKAUER SUCT BULB TIP NO VENT (SUCTIONS) ×2 IMPLANT

## 2012-06-22 NOTE — Anesthesia Postprocedure Evaluation (Signed)
  Anesthesia Post-op Note  Patient: [name] [name]  Procedure(s) Performed: Procedure(s) (LRB): IRRIGATION AND DEBRIDEMENT EXTREMITY (Left)  Patient Location: PACU  Anesthesia Type: General  Level of Consciousness: awake, alert  and oriented  Airway and Oxygen Therapy: Patient Spontanous Breathing and Patient connected to nasal cannula oxygen  Post-op Pain: mild  Post-op Assessment: Post-op Vital signs reviewed and Patient's Cardiovascular Status Stable  Post-op Vital Signs: stable  Complications: No apparent anesthesia complications

## 2012-06-22 NOTE — Transfer of Care (Signed)
Immediate Anesthesia Transfer of Care Note  Patient: [name] [name]  Procedure(s) Performed: Procedure(s) (LRB): IRRIGATION AND DEBRIDEMENT EXTREMITY (Left)  Patient Location: PACU  Anesthesia Type: General  Level of Consciousness: sedated  Airway & Oxygen Therapy: Patient Spontanous Breathing and Patient connected to nasal cannula oxygen  Post-op Assessment: Report given to PACU RN and Post -op Vital signs reviewed and stable  Post vital signs: Reviewed and stable  Complications: No apparent anesthesia complications

## 2012-06-22 NOTE — Op Note (Signed)
Dictation [phone]

## 2012-06-22 NOTE — Anesthesia Preprocedure Evaluation (Addendum)
Anesthesia Evaluation  Patient identified by MRN, date of birth, ID band Patient awake    Reviewed: Allergy & Precautions, NPO status , Patient's Chart, lab work & pertinent test results, reviewed documented beta blocker date and time   Airway Mallampati: III TM Distance: >3 FB Neck ROM: Full    Dental  (+) Teeth Intact and Dental Advisory Given   Pulmonary  breath sounds clear to auscultation        Cardiovascular hypertension, Pt. on home beta blockers and Pt. on medications + CAD, + Past MI and + Cardiac Stents Rhythm:Regular Rate:Normal     Neuro/Psych    GI/Hepatic   Endo/Other  Type 2, Oral Hypoglycemic Agents  Renal/GU      Musculoskeletal   Abdominal   Peds  Hematology   Anesthesia Other Findings Erythema and edema of L. Hand  Reproductive/Obstetrics                          Anesthesia Physical Anesthesia Plan  ASA: III  Anesthesia Plan: General   Post-op Pain Management:    Induction: Intravenous  Airway Management Planned: LMA  Additional Equipment:   Intra-op Plan:   Post-operative Plan:   Informed Consent: I have reviewed the patients History and Physical, chart, labs and discussed the procedure including the risks, benefits and alternatives for the proposed anesthesia with the patient or authorized representative who has indicated his/her understanding and acceptance.   Dental advisory given  Plan Discussed with: CRNA and Surgeon  Anesthesia Plan Comments: (Infection L. Hand Type 2 DM glucose 202 htn H/O CAD S/P PTCA with stents of RCA, LAD, and LCx in 2004 no angina clinically stable ECG OK 11/26/11  Plan GA with LMA)        Anesthesia Quick Evaluation

## 2012-06-22 NOTE — H&P (Signed)
[name] [name] is an 66 y.o. female.   Chief Complaint: left hand infection HPI: 66 yo rhd female states she had rash on left hand x 1-2 weeks.  Woke today with swelling and erythema and pain in hand.  Remembers no wounds or injury to hand.  Fevers and chills.  Past Medical History  Diagnosis Date  . Coronary artery disease     s/p stent to LAD and LCx  . Hyperlipidemia   . Hypertension   . Diabetes mellitus   . Myocardial infarction     7 yrs ago    Past Surgical History  Procedure Date  . Coronary angioplasty with stent placement 04/26/2003    NORMAL. EF 65%  . Cesarean section     Family History  Problem Relation Age of Onset  . Heart attack Mother     X2  . Aneurysm Mother   . Heart attack Father    Social History:  reports that she has never smoked. She does not have any smokeless tobacco history on file. She reports that she does not drink alcohol or use illicit drugs.  Allergies:  Allergies  Allergen Reactions  . Codeine Other (See Comments)    unknown  . Ibuprofen Other (See Comments)    unknown  . Latex Other (See Comments)    unknown  . Penicillins Other (See Comments)    unknown    Medications Prior to Admission  Medication Sig Dispense Refill  . aspirin 325 MG EC tablet Take 325 mg by mouth daily.        . Calcium Carbonate-Vitamin D (CALCIUM + D PO) Take 1 tablet by mouth daily.       . fenofibrate micronized (LOFIBRA) 200 MG capsule Take 1 capsule (200 mg total) by mouth daily before breakfast.  30 capsule  11  . fish oil-omega-3 fatty acids 1000 MG capsule Take 3 g by mouth daily.        . metFORMIN (GLUCOPHAGE) 500 MG tablet Take 500 mg by mouth 2 (two) times daily with a meal.        . metoprolol tartrate (LOPRESSOR) 25 MG tablet Take 12.5 mg by mouth 2 (two) times daily.       . nitroGLYCERIN (NITROSTAT) 0.4 MG SL tablet Place 0.4 mg under the tongue every 5 (five) minutes as needed. For chest pain      . rosuvastatin (CRESTOR) 20 MG tablet Take  20 mg by mouth daily.        Results for orders placed during the hospital encounter of 06/22/12 (from the past 48 hour(s))  CBC WITH DIFFERENTIAL     Status: Abnormal   Collection Time   06/22/12  6:04 PM      Component Value Range Comment   WBC 19.2 (*) 4.0 - 10.5 K/uL    RBC 4.09  3.87 - 5.11 MIL/uL    Hemoglobin 13.1  12.0 - 15.0 g/dL    HCT 16.1  09.6 - 04.5 %    MCV 93.2  78.0 - 100.0 fL    MCH 32.0  26.0 - 34.0 pg    MCHC 34.4  30.0 - 36.0 g/dL    RDW 40.9  81.1 - 91.4 %    Platelets 371  150 - 400 K/uL    Neutrophils Relative 92 (*) 43 - 77 %    Neutro Abs 17.7 (*) 1.7 - 7.7 K/uL    Lymphocytes Relative 2 (*) 12 - 46 %    Lymphs Abs  0.4 (*) 0.7 - 4.0 K/uL    Monocytes Relative 5  3 - 12 %    Monocytes Absolute 1.0  0.1 - 1.0 K/uL    Eosinophils Relative 0  0 - 5 %    Eosinophils Absolute 0.0  0.0 - 0.7 K/uL    Basophils Relative 0  0 - 1 %    Basophils Absolute 0.0  0.0 - 0.1 K/uL     No results found.   A comprehensive review of systems was negative except for: Constitutional: positive for chills and fevers  Blood pressure 145/69, pulse 105, temperature 103.5 F (39.7 C), temperature source Oral, resp. rate 16, height 5\' 2"  (1.575 m), weight 72.576 kg (160 lb), SpO2 95.00%.  General appearance: alert, cooperative and appears stated age Head: Normocephalic, without obvious abnormality, atraumatic Neck: supple, symmetrical, trachea midline Resp: clear to auscultation bilaterally Cardio: regular rate and rhythm GI: soft, non-tender; bowel sounds normal; no masses,  no organomegaly Extremities: light touch sensation and capillary refill intact all digits.  +epl/fpl/io.  swelling, erythema, pustules on dorsum of left hand, index and long fingers.  streak into forearm. Pulses: 2+ and symmetric Skin: as above Neurologic: Grossly normal Incision/Wound: Small area of serous drainage between index and long fingers  Assessment/Plan Left hand infection.   Recommend OR  for irrigation and debridement.  Risks, benefits, and alternatives of surgery were discussed and the patient agrees with the plan of care.   Kiyomi Pallo R 06/22/2012, 7:46 PM

## 2012-06-22 NOTE — Anesthesia Procedure Notes (Signed)
Procedure Name: Intubation Date/Time: 06/22/2012 8:26 PM Performed by: Alanda Amass A Pre-anesthesia Checklist: Patient identified, Timeout performed, Emergency Drugs available, Suction available and Patient being monitored Patient Re-evaluated:Patient Re-evaluated prior to inductionOxygen Delivery Method: Circle system utilized Preoxygenation: Pre-oxygenation with 100% oxygen Intubation Type: IV induction, Rapid sequence and Cricoid Pressure applied Grade View: Grade I Tube type: Oral Tube size: 7.5 mm Number of attempts: 1 Airway Equipment and Method: Video-laryngoscopy and Stylet Placement Confirmation: ETT inserted through vocal cords under direct vision,  breath sounds checked- equal and bilateral and positive ETCO2 Secured at: 21 cm Dental Injury: Teeth and Oropharynx as per pre-operative assessment  Difficulty Due To: Difficulty was anticipated

## 2012-06-22 NOTE — ED Notes (Addendum)
Pt here with left hand redness and swelling with streaking up left arm; pt sent from PCP; pt with left leg swelling; pt poor historian

## 2012-06-23 LAB — CBC
HCT: 35.4 % — ABNORMAL LOW (ref 36.0–46.0)
MCHC: 33.6 g/dL (ref 30.0–36.0)
MCV: 95.2 fL (ref 78.0–100.0)
Platelets: 295 10*3/uL (ref 150–400)
RBC: 3.72 MIL/uL — ABNORMAL LOW (ref 3.87–5.11)
RDW: 14.5 % (ref 11.5–15.5)
WBC: 18.1 10*3/uL — ABNORMAL HIGH (ref 4.0–10.5)
WBC: 13.1 10*3/uL — ABNORMAL HIGH (ref 4.0–10.5)

## 2012-06-23 LAB — GLUCOSE, CAPILLARY: Glucose-Capillary: 147 mg/dL — ABNORMAL HIGH (ref 70–99)

## 2012-06-23 MED ORDER — OXYCODONE-ACETAMINOPHEN 5-325 MG PO TABS: ORAL_TABLET | ORAL | Status: DC

## 2012-06-23 MED ORDER — BACITRACIN-NEOMYCIN-POLYMYXIN OINTMENT TUBE
TOPICAL_OINTMENT | Freq: Every day | CUTANEOUS | Status: DC
  Administered 2012-06-23: 17:00:00 via TOPICAL
  Filled 2012-06-23: qty 15

## 2012-06-23 MED ORDER — SULFAMETHOXAZOLE-TRIMETHOPRIM 800-160 MG PO TABS: 1.0000 | ORAL_TABLET | Freq: Two times a day (BID) | ORAL | Status: DC

## 2012-06-23 MED ORDER — OMEGA-3-ACID ETHYL ESTERS 1 G PO CAPS
3.0000 g | ORAL_CAPSULE | Freq: Every day | ORAL | Status: DC
  Administered 2012-06-23: 3 g via ORAL
  Filled 2012-06-23: qty 3

## 2012-06-23 MED ORDER — VANCOMYCIN HCL 1000 MG IV SOLR
750.0000 mg | Freq: Two times a day (BID) | INTRAVENOUS | Status: DC
  Administered 2012-06-23: 750 mg via INTRAVENOUS
  Filled 2012-06-23 (×2): qty 750

## 2012-06-23 NOTE — Discharge Summary (Signed)
  Dictation [phone]

## 2012-06-23 NOTE — Op Note (Signed)
[name], INSCO NO.:  [id number]  MEDICAL RECORD NO.:  [id number]  LOCATION:  5N05C                        FACILITY:  [facility]  PHYSICIAN:  Betha Loa, MD        DATE OF BIRTH:  27-Nov-1945  DATE OF PROCEDURE:  06/22/2012 DATE OF DISCHARGE:                              OPERATIVE REPORT   PREOPERATIVE DIAGNOSIS:  Left hand abscess.  POSTOPERATIVE DIAGNOSIS:  Left hand abscess.  PROCEDURE:  Irrigation and debridement, left hand abscess.  SURGEON:  Betha Loa, MD  ASSISTANT:  None.  ANESTHESIA:  General.  IV FLUIDS:  Per anesthesia flow sheet.  ESTIMATED BLOOD LOSS:  Minimal.  COMPLICATIONS:  None.  SPECIMENS:  Cultures to micro.  TOURNIQUET TIME:  39 minutes.  DISPOSITION:  Stable to PACU.  INDICATIONS:  [name] [name] is a 66 year old right-hand dominant female who had noticed a rash on her left hand for a week or 2.  She states she woke up this morning and then the hand was swollen, erythematous and painful.  She was seen by her primary care physician and consulted me for further management.  She was noted to have a fever of 102 and a [place] count of 16[address].  On examination, she had intact sensation, capillary refill in all fingertips.  She had a fluctuant area on the dorsum of the hand and in the proximal aspect of the index and long fingers on the dorsum.  She is not tender volarly.  There was a streak going up the dorsum of her forearm.  I recommended [name] [name] going to the operating room for irrigation and debridement of the hand.  Risks, benefits, and alternatives of surgery were discussed including risk of blood loss, infection, damage to nerves, vessels, tendons, ligaments, bone; failure of surgery; need for additional surgery, complications with wound healing, continued pain, continued infection, need for repeat irrigation and debridement.  She voiced understanding of these risks and elected to proceed.  OPERATIVE COURSE:  After being  identified preoperatively by myself, the patient and I agreed upon procedure and site procedure.  The surgical site was marked.  The risks, benefits, and alternatives of surgery were reviewed and she wished to proceed.  Surgical consent had been signed. She was transferred to the operating room, placed on the operating room table in supine position with left upper extremity on an arm board. General anesthesia was induced by anesthesiologist.  The left upper extremity was prepped and draped in normal sterile orthopedic fashion. Surgical pause was performed between surgeons, anesthesia and operating staff, and all were in agreement as to the patient, procedure, and site of procedure.  Tourniquet at the proximal aspect of the extremity was inflated to 250 mmHg after exsanguination of the limb by gravity.  An incision was made on the dorsum of the hand over the proximal phalanx, the index finger and between the index and long finger metacarpals. This was carried into subcutaneous tissues by spreading technique. There was gross purulence.  There is also a lot of edema.  Cultures were taken for aerobes, anaerobes, AFB, fungus and Gram stain.  The fat in the area was necrotic and friable.  This was removed using  a combination of rongeurs and knife and curette.  The wound coursed over the ring finger metacarpal and back to the base of the metacarpals.  Also tracked into the dorsal aspect of the index and long finger over the proximal phalanx.  The wound was copiously irrigated with 1500 mL of sterile saline by cysto tubing.  It was then re-examined.  The area under the tendons was examined and there was no purulence deep to the tendons. The area was re-debrided.  Again, another 1500 mL of sterile saline by cysto tubing was washed through the wound.  The wound appeared much improved.  It was packed with quarter-inch iodoform gauze.  A 17 mL of 0.25% plain Marcaine were injected in the skin edges to  aid in postoperative analgesia.  The wound was then dressed with sterile Xeroform, 4x4s, and ABD and wrapped with Kerlix.  A volar splint was placed and wrapped with Kerlix and Ace bandage.  Tourniquet was deflated at 39 minutes.  The fingertips were pink with brisk capillary refill after deflation of tourniquet.  Operative drapes were broken down.  The patient was awoken from anesthesia safely.  She was transferred back to stretcher and taken to PACU in stable condition.  She will be admitted for IV antibiotics.  We will recheck her tomorrow.     Betha Loa, MD     KK/MEDQ  D:  06/22/2012  T:  06/23/2012  Job:  [id number]

## 2012-06-23 NOTE — Progress Notes (Signed)
Subjective: 1 Day Post-Op Procedure(s) (LRB): IRRIGATION AND DEBRIDEMENT EXTREMITY (Left) Patient reports pain as describes burning sensation.  almost no pain.  has not requested any narcotic pain meds.  feels much better..    Objective: Vital signs in last 24 hours: Temp:  [97.2 F (36.2 C)-103.5 F (39.7 C)] 100.3 F (37.9 C) (08/21 0558) Pulse Rate:  [84-115] 84  (08/21 0558) Resp:  [16-21] 20  (08/21 0558) BP: (120-146)/(43-69) 123/43 mmHg (08/21 0558) SpO2:  [95 %-100 %] 100 % (08/21 0558) FiO2 (%):  [28 %] 28 % (08/20 2357) Weight:  [72.576 kg (160 lb)] 72.576 kg (160 lb) (08/20 1900)  Intake/Output from previous day: 08/20 0701 - 08/21 0700 In: 1100 [I.V.:1100] Out: 15 [Blood:15] Intake/Output this shift: Total I/O In: 480 [P.O.:480] Out: -    Basename 06/23/12 0605 06/22/12 1804  HGB 11.9* 13.1    Basename 06/23/12 0605 06/22/12 1804  WBC 18.1* 19.2*  RBC 3.72* 4.09  HCT 35.4* 38.1  PLT 280 371    Basename 06/22/12 1804  NA 137  K 4.3  CL 99  CO2 24  BUN 17  CREATININE 0.78  GLUCOSE 210*  CALCIUM 10.2   No results found for this basename: LABPT:2,INR:2 in the last 72 hours  light touch sensation and capillary refill intact all digits.  wiggles fingers and thumb.  streak at proximal forearm resolved.  dressing clean, dry, intact  Assessment/Plan: 1 Day Post-Op Procedure(s) (LRB): IRRIGATION AND DEBRIDEMENT EXTREMITY (Left) Feels and looks much better.  Sitting up in bed eating at time of visit.  States last time her temp was taken it was ~99.  Would like to go home.  Will check CBC this evening and if improved and afebrile, may be able to go home tonight.    Brenna Friesenhahn R 06/23/2012, 11:53 AM

## 2012-06-23 NOTE — Progress Notes (Signed)
Utilization review completed. Tarina Volk, RN, BSN. 

## 2012-06-23 NOTE — Progress Notes (Signed)
ANTIBIOTIC CONSULT NOTE - INITIAL  Pharmacy Consult for vancomycin Indication: ?cellulitis s/p I&D  Allergies  Allergen Reactions  . Codeine Other (See Comments)    unknown  . Ibuprofen Other (See Comments)    unknown  . Latex Other (See Comments)    unknown  . Penicillins Other (See Comments)    unknown    Patient Measurements: Height: 5\' 2"  (157.5 cm) Weight: 160 lb (72.576 kg) IBW/kg (Calculated) : 50.1   Vital Signs: Temp: 100.4 F (38 C) (08/20 2245) Temp src: Oral (08/20 1900) BP: 140/67 mmHg (08/20 2130) Pulse Rate: 95  (08/20 2130) Intake/Output from previous day: 08/20 0701 - 08/21 0700 In: 1100 [I.V.:1100] Out: 15 [Blood:15] Intake/Output from this shift: Total I/O In: 1100 [I.V.:1100] Out: 15 [Blood:15]  Labs:  [facility] 06/22/12 1804  WBC 19.2*  HGB 13.1  PLT 371  LABCREA --  CREATININE 0.78   Estimated Creatinine Clearance: 64.5 ml/min (by C-G formula based on Cr of 0.78). No results found for this basename: VANCOTROUGH:2,VANCOPEAK:2,VANCORANDOM:2,GENTTROUGH:2,GENTPEAK:2,GENTRANDOM:2,TOBRATROUGH:2,TOBRAPEAK:2,TOBRARND:2,AMIKACINPEAK:2,AMIKACINTROU:2,AMIKACIN:2, in the last 72 hours   Microbiology: Recent Results (from the past 720 hour(s))  SURGICAL PCR SCREEN     Status: Abnormal   Collection Time   06/22/12  7:14 PM      Component Value Range Status Comment   MRSA, PCR NEGATIVE  NEGATIVE Final    Staphylococcus aureus POSITIVE (*) NEGATIVE Final     Medical History: Past Medical History  Diagnosis Date  . Coronary artery disease     s/p stent to LAD and LCx  . Hyperlipidemia   . Hypertension   . Diabetes mellitus   . Myocardial infarction     7 yrs ago    Medications:  Prescriptions prior to admission  Medication Sig Dispense Refill  . aspirin 325 MG EC tablet Take 325 mg by mouth daily.        . Calcium Carbonate-Vitamin D (CALCIUM + D PO) Take 1 tablet by mouth daily.       . fenofibrate micronized (LOFIBRA) 200 MG capsule  Take 1 capsule (200 mg total) by mouth daily before breakfast.  30 capsule  11  . fish oil-omega-3 fatty acids 1000 MG capsule Take 3 g by mouth daily.        . metFORMIN (GLUCOPHAGE) 500 MG tablet Take 500 mg by mouth 2 (two) times daily with a meal.        . metoprolol tartrate (LOPRESSOR) 25 MG tablet Take 12.5 mg by mouth 2 (two) times daily.       . nitroGLYCERIN (NITROSTAT) 0.4 MG SL tablet Place 0.4 mg under the tongue every 5 (five) minutes as needed. For chest pain      . rosuvastatin (CRESTOR) 20 MG tablet Take 20 mg by mouth daily.       Assessment: 74 yof presented with left hand infection. She is febrile with a tmax of 103.5 and WBC of 19.2. Now s/p I&D to start empiric vancomycin. She received a 1gm dose pre-op last night.   Goal of Therapy:  Vancomycin trough level 10-15 mcg/ml  Plan:  1. Vancomycin 750mg  IV Q12H 2. F/u renal fxn, C&S and trough at SS 3. F/u LOT  Saagar Tortorella, Drake Leach 06/23/2012,12:19 AM

## 2012-06-23 NOTE — Consult Note (Signed)
WOC consult Note Reason for Consult: eval. Left foot wound.  Pt reports she hit this on the bed frame.  Has hx. Of DM but does not routinely check her blood sugars.  Family at bedside reports she had a wound on the other leg that was difficult to heal, but pt does not recall this.  Wound type:trauma Measurement: 0.5cm x 0.5cm x 0 Wound bed: covered wound with small scab Drainage (amount, consistency, odor) none Periwound: normal erythema at the wound edges consistent with wound healing. No streaking or otherwise indication of infection.  Some minimal pain at site with palpation.  Dressing procedure/placement/frequency: will order triple antibiotic ointment and cover dressing for protection of this area.  Recommended to family and pt to continue to monitor for changes once dc to home.    Re consult if needed, will not follow at this time. Thanks  Eurika Sandy [employer], CWOCN [phone])

## 2012-06-23 NOTE — Progress Notes (Signed)
Subjective: POD #1 of hand infection. 103.5 = Tmax Pt with DM2 and more erratic with infection. Not in pain, No current infection. No CP or SOB  Objective: Vital signs in last 24 hours: Temp:  [97.2 F (36.2 C)-103.5 F (39.7 C)] 100.3 F (37.9 C) (08/21 0558) Pulse Rate:  [84-115] 84  (08/21 0558) Resp:  [16-21] 20  (08/21 0558) BP: (120-146)/(43-69) 123/43 mmHg (08/21 0558) SpO2:  [95 %-100 %] 100 % (08/21 0558) FiO2 (%):  [28 %] 28 % (08/20 2357) Weight:  [72.576 kg (160 lb)] 72.576 kg (160 lb) (08/20 1900) Weight change:  Last BM Date: 06/22/12  CBG (last 3)   Basename 06/23/12 0630 06/22/12 2146 06/22/12 1853  GLUCAP 157* 220* 212*    Intake/Output from previous day:  Intake/Output Summary (Last 24 hours) at 06/23/12 0721 Last data filed at 06/22/12 2136  Gross per 24 hour  Intake   1100 ml  Output     15 ml  Net   1085 ml   08/20 0701 - 08/21 0700 In: 1100 [I.V.:1100] Out: 15 [Blood:15]   Physical Exam  General appearance: A and oriented. Appears tired Eyes: no scleral icterus Throat: oropharynx moist without erythema Resp: CTA Cardio: Reg GI: soft, non-tender; bowel sounds normal; no masses,  no organomegaly Extremities: L hand and arm wrapped  Lab Results:  Basename 06/22/12 1804  NA 137  K 4.3  CL 99  CO2 24  GLUCOSE 210*  BUN 17  CREATININE 0.78  CALCIUM 10.2  MG --  PHOS --    No results found for this basename: AST:2,ALT:2,ALKPHOS:2,BILITOT:2,PROT:2,ALBUMIN:2 in the last 72 hours   Basename 06/23/12 0605 06/22/12 1804  WBC 18.1* 19.2*  NEUTROABS -- 17.7*  HGB 11.9* 13.1  HCT 35.4* 38.1  MCV 95.2 93.2  PLT 280 371    No results found for this basename: INR, PROTIME    No results found for this basename: CKTOTAL:3,CKMB:3,CKMBINDEX:3,TROPONINI:3 in the last 72 hours  No results found for this basename: TSH,T4TOTAL,FREET3,T3FREE,THYROIDAB in the last 72 hours  No results found for this basename:  VITAMINB12:2,FOLATE:2,FERRITIN:2,TIBC:2,IRON:2,RETICCTPCT:2 in the last 72 hours  Micro Results: Recent Results (from the past 240 hour(s))  SURGICAL PCR SCREEN     Status: Abnormal   Collection Time   06/22/12  7:14 PM      Component Value Range Status Comment   MRSA, PCR NEGATIVE  NEGATIVE Final    Staphylococcus aureus POSITIVE (*) NEGATIVE Final   CULTURE, ROUTINE-ABSCESS     Status: Normal (Preliminary result)   Collection Time   06/22/12  8:57 PM      Component Value Range Status Comment   Specimen Description ABSCESS HAND LEFT   Final    Special Requests NONE   Final    Gram Stain PENDING   Incomplete    Culture Culture reincubated for better growth   Final    Report Status PENDING   Incomplete      Studies/Results: Dg Chest Port 1 View  06/22/2012  *RADIOLOGY REPORT*  Clinical Data:  Preop, febrile  PORTABLE CHEST - 1 VIEW  Comparison: None.  Findings: Low inspiratory volumes.  Linear opacities in the bilateral retrocardiac regions are favored to reflect atelectasis. No pulmonary edema.  Cardiac and mediastinal contours are within normal limits.  No acute osseous findings.  IMPRESSION:  Low inspiratory volumes with bibasilar linear opacities favored to reflect atelectasis.  Infiltrate difficult to exclude on a single frontal view.   Original Report Authenticated By: Vilma Prader  Medications: Scheduled:   . aspirin  325 mg Oral Daily  . atorvastatin  40 mg Oral q1800  . calcium-vitamin D  0.5 tablet Oral Daily  . chlorhexidine  60 mL Topical Once  . enoxaparin (LOVENOX) injection  40 mg Subcutaneous Q24H  . fenofibrate  160 mg Oral Daily  . insulin aspart  0-15 Units Subcutaneous TID WC  . metFORMIN  500 mg Oral BID WC  . metoprolol tartrate  12.5 mg Oral BID  . mupirocin ointment   Nasal BID  . omega-3 acid ethyl esters  3 g Oral Daily  . vancomycin  750 mg Intravenous Q12H  . DISCONTD: calcium-vitamin D  1 tablet Oral Daily  . DISCONTD: fish oil-omega-3 fatty acids  3  g Oral Daily  . DISCONTD: vancomycin  1,000 mg Intravenous Q24H   Continuous:   . lactated ringers 1,000 mL (06/22/12 2200)     Assessment/Plan: Active Problems:  * No active hospital problems. *   L Hand Infection/Cellulitis c Fevers and Leukocytosis - POD #1.  On Vanco HTN - BP fine. CAD - no Angina Lipids - on meds.   DM2 - Metformin and ISS.  Cr fine. No changes from my standpoint.  Jello and liquids will elevate CBGs but should be easily controlled.  I believe her last A1C was @ 7.2.  Basename 06/23/12 0630 06/22/12 2146 06/22/12 1853  GLUCAP 157* 220* 212*   ID -  Anti-infectives     Start     Dose/Rate Route Frequency Ordered Stop   06/23/12 1000   vancomycin (VANCOCIN) 750 mg in sodium chloride 0.9 % 150 mL IVPB        750 mg 150 mL/hr over 60 Minutes Intravenous Every 12 hours 06/23/12 0016     06/23/12 0000   vancomycin (VANCOCIN) IVPB 1000 mg/200 mL premix  Status:  Discontinued        1,000 mg 200 mL/hr over 60 Minutes Intravenous Every 24 hours 06/22/12 2357 06/23/12 0004         DVT Prophylaxis -SCDs    LOS: 1 day   Kathrynne Kulinski M 06/23/2012, 7:21 AM

## 2012-06-24 ENCOUNTER — Encounter (HOSPITAL_COMMUNITY): Payer: Self-pay | Admitting: Orthopedic Surgery

## 2012-06-24 DIAGNOSIS — L03119 Cellulitis of unspecified part of limb: Secondary | ICD-10-CM | POA: Diagnosis not present

## 2012-06-24 DIAGNOSIS — L02519 Cutaneous abscess of unspecified hand: Secondary | ICD-10-CM | POA: Diagnosis not present

## 2012-06-24 LAB — GLUCOSE, CAPILLARY

## 2012-06-24 NOTE — Progress Notes (Signed)
Patient discharged at 2045. IV removed. Catheter intact. Discharge instructions explained and given. Prescriptions given. Family present. No questions or concerned. Transported downstairs via w/c per NT.

## 2012-06-24 NOTE — Discharge Summary (Signed)
NAMEKATHYJO, BRIERE NO.:  [id number]  MEDICAL RECORD NO.:  [id number]  LOCATION:  5N05C                        FACILITY:  [facility]  PHYSICIAN:  Betha Loa, MD        DATE OF BIRTH:  10-16-1946  DATE OF ADMISSION:  06/22/2012 DATE OF DISCHARGE:  06/23/2012                              DISCHARGE SUMMARY   FINAL DIAGNOSIS:  Left hand abscess.  PROCEDURE:  Left hand irrigation and debridement.  HISTORY:  Ms. Turvey is a 66 year old female who has noted a pain and swelling in her left hand on the day of admission.  She had fevers and chills.  She presented to her primary care physician and referred to me for further care.  On evaluation, she had a swollen, red hand.  She had tenderness to palpation and fluctuance in the dorsum of the hand.  She had a fever of 102 and a white count of 19,000.  I recommended Ms. Shorb going to the operating for irrigation and debridement of the hand.  Risks, benefits and alternatives of the surgery were discussed including the risk of blood loss; infection; damage to nerves, vessels, tendons, ligaments, bone; failure of procedure; need for additional procedures; complications with wound healing; continued pain; continued infection; need for repeat irrigation and debridement.  She voiced understanding and elected to proceed.  HOSPITAL COURSE:  Ms. Laroque was taken to the operating room on the day of her admission.  Irrigation and debridement of the left hand was performed.  She tolerated the procedure well.  She was well postoperatively.  She has been afebrile all day.  Her white count went from 19 yesterday to 18 this morning and to 13 this evening.  It was felt that she was stable for discharge.  She has required no pain medication.  She will be discharged home with a prescription for Percocet 5/325 1-2 p.o. q.6 hours p.r.n. pain, dispensed #20 and Bactrim DS 1 p.o. b.i.d. x7 days.  She will follow up in the office within next 2  days for hydrotherapy and with me next week.     Betha Loa, MD     KK/MEDQ  D:  06/23/2012  T:  06/24/2012  Job:  [id number]

## 2012-06-25 ENCOUNTER — Encounter (HOSPITAL_COMMUNITY): Payer: Self-pay | Admitting: Adult Health

## 2012-06-25 ENCOUNTER — Emergency Department (HOSPITAL_COMMUNITY)
Admission: EM | Admit: 2012-06-25 | Discharge: 2012-06-26 | Disposition: A | Discharge status: Home / Self Care | Payer: Medicare Other | Attending: Emergency Medicine | Admitting: Emergency Medicine

## 2012-06-25 DIAGNOSIS — E119 Type 2 diabetes mellitus without complications: Secondary | ICD-10-CM | POA: Insufficient documentation

## 2012-06-25 DIAGNOSIS — Z888 Allergy status to other drugs, medicaments and biological substances status: Secondary | ICD-10-CM | POA: Insufficient documentation

## 2012-06-25 DIAGNOSIS — R42 Dizziness and giddiness: Secondary | ICD-10-CM | POA: Diagnosis not present

## 2012-06-25 DIAGNOSIS — Z885 Allergy status to narcotic agent status: Secondary | ICD-10-CM | POA: Insufficient documentation

## 2012-06-25 DIAGNOSIS — M79609 Pain in unspecified limb: Secondary | ICD-10-CM | POA: Insufficient documentation

## 2012-06-25 DIAGNOSIS — I252 Old myocardial infarction: Secondary | ICD-10-CM | POA: Insufficient documentation

## 2012-06-25 DIAGNOSIS — M79642 Pain in left hand: Secondary | ICD-10-CM

## 2012-06-25 DIAGNOSIS — I10 Essential (primary) hypertension: Secondary | ICD-10-CM | POA: Insufficient documentation

## 2012-06-25 DIAGNOSIS — Z91040 Latex allergy status: Secondary | ICD-10-CM | POA: Insufficient documentation

## 2012-06-25 DIAGNOSIS — I2510 Atherosclerotic heart disease of native coronary artery without angina pectoris: Secondary | ICD-10-CM | POA: Insufficient documentation

## 2012-06-25 DIAGNOSIS — R509 Fever, unspecified: Secondary | ICD-10-CM | POA: Diagnosis not present

## 2012-06-25 DIAGNOSIS — Z880 Allergy status to penicillin: Secondary | ICD-10-CM | POA: Insufficient documentation

## 2012-06-25 LAB — BASIC METABOLIC PANEL
CO2: 24 mEq/L (ref 19–32)
Chloride: 98 mEq/L (ref 96–112)
Potassium: 3.8 mEq/L (ref 3.5–5.1)
Sodium: 134 mEq/L — ABNORMAL LOW (ref 135–145)

## 2012-06-25 LAB — GLUCOSE, CAPILLARY: Glucose-Capillary: 281 mg/dL — ABNORMAL HIGH (ref 70–99)

## 2012-06-25 LAB — CBC WITH DIFFERENTIAL/PLATELET
Basophils Absolute: 0 10*3/uL (ref 0.0–0.1)
HCT: 34.2 % — ABNORMAL LOW (ref 36.0–46.0)
Lymphocytes Relative: 1 % — ABNORMAL LOW (ref 12–46)
Neutro Abs: 10.5 10*3/uL — ABNORMAL HIGH (ref 1.7–7.7)
Platelets: 279 10*3/uL (ref 150–400)
RDW: 14.1 % (ref 11.5–15.5)
WBC: 11.1 10*3/uL — ABNORMAL HIGH (ref 4.0–10.5)

## 2012-06-25 LAB — CULTURE, ROUTINE-ABSCESS

## 2012-06-25 MED FILL — Insulin Aspart Inj 100 Unit/ML: SUBCUTANEOUS | Qty: 0.03 | Status: AC

## 2012-06-25 MED FILL — Insulin Aspart Inj 100 Unit/ML: SUBCUTANEOUS | Qty: 0.02 | Status: AC

## 2012-06-25 NOTE — ED Notes (Signed)
Pt states she had a fever of 104.0 at 1700 hrs and dizziness..  Pt had I and D of left hand on 8.20.13 by DrKuzma.   Left hand dressing CDI with swelling and stiffness to left hand fingers.  Denies numbness or tingling to left LU.  Red rash noted to bilat lower extremeties with  Area of redness to left outer ankle.  Placed on cardiac monitor in SR rate 75  With SBP. 103/49.  Denies Pain and dizziness at present.

## 2012-06-25 NOTE — ED Notes (Signed)
Pt c/o dizzyness that began last night, today she began running a fever of 104.1. Psot op I&D on left hand from Tuesday with Dr. Mina Marble.. LEft foot and leg swelling and redness, rash noted to bilateral knees and hands.  Dr. Mina Marble would like his RN Lyn paged after pt has been seen by physician, number is [phone]. PT is currently taking Sulfa.

## 2012-06-26 MED ORDER — ONDANSETRON HCL 8 MG PO TABS: 8.0000 mg | ORAL_TABLET | ORAL | Status: DC | PRN

## 2012-06-26 NOTE — ED Notes (Signed)
Left hand dressing removed by Dr Adriana Simas.  Eccymosis noted to surrounding tissue with redness and swelling noted to hand and fingers.  CNS intact.mOves all digits.  Iodoform gauze left in place to wound.  Redressed with 4 x 4's kerlex and ace wrap per Dr Adriana Simas

## 2012-06-26 NOTE — ED Provider Notes (Signed)
History     CSN: [id number]  Arrival date & time 06/25/12  2057   First MD Initiated Contact with Patient 06/25/12 2308      Chief Complaint  Patient presents with  . Dizziness    (Consider location/radiation/quality/duration/timing/severity/associated sxs/prior treatment) HPI.... status post I and D. of dorsum of left hand on Tuesday night by Dr. Merlyn Lot.  Complains of fever, dizziness, redness and left lower extremity. No Chest pain, chills.  Patient presently on Septra. Dressing was changed on Thursday. Nothing makes symptoms better or worse. Severity is mild.  Past Medical History  Diagnosis Date  . Coronary artery disease     s/p stent to LAD and LCx  . Hyperlipidemia   . Hypertension   . Diabetes mellitus   . Myocardial infarction     7 yrs ago    Past Surgical History  Procedure Date  . Coronary angioplasty with stent placement 04/26/2003    NORMAL. EF 65%  . Cesarean section   . I&d extremity 06/22/2012    Procedure: IRRIGATION AND DEBRIDEMENT EXTREMITY;  Surgeon: Tami Ribas, MD;  Location: [facility] OR;  Service: Orthopedics;  Laterality: Left;    Family History  Problem Relation Age of Onset  . Heart attack Mother     X2  . Aneurysm Mother   . Heart attack Father     History  Substance Use Topics  . Smoking status: Never Smoker   . Smokeless tobacco: Not on file  . Alcohol Use: No    OB History    Grav Para Term Preterm Abortions TAB SAB Ect Mult Living                  Review of Systems  All other systems reviewed and are negative.    Allergies  Codeine; Ibuprofen; Latex; and Penicillins  Home Medications   Current Outpatient Rx  Name Route Sig Dispense Refill  . ASPIRIN 325 MG PO TBEC Oral Take 325 mg by mouth daily.      Marland Kitchen CALCIUM + D PO Oral Take 1 tablet by mouth daily.     . FENOFIBRATE MICRONIZED 200 MG PO CAPS Oral Take 200 mg by mouth daily before breakfast.    . OMEGA-3 FATTY ACIDS 1000 MG PO CAPS Oral Take 3 g by mouth daily.        Marland Kitchen METFORMIN HCL 500 MG PO TABS Oral Take 500 mg by mouth 2 (two) times daily with a meal.      . METOPROLOL TARTRATE 25 MG PO TABS Oral Take 12.5 mg by mouth 2 (two) times daily.     Marland Kitchen NITROGLYCERIN 0.4 MG SL SUBL Sublingual Place 0.4 mg under the tongue every 5 (five) minutes as needed. For chest pain    . OXYCODONE-ACETAMINOPHEN 5-325 MG PO TABS Oral Take 1-2 tablets by mouth every 6 (six) hours as needed. For pain.    Marland Kitchen ROSUVASTATIN CALCIUM 20 MG PO TABS Oral Take 20 mg by mouth daily.    . SULFAMETHOXAZOLE-TMP DS 800-160 MG PO TABS Oral Take 1 tablet by mouth 2 (two) times daily.    Marland Kitchen ONDANSETRON HCL 8 MG PO TABS Oral Take 1 tablet (8 mg total) by mouth every 4 (four) hours as needed for nausea. 10 tablet 0    BP 119/54  Pulse 81  Temp 99.1 F (37.3 C) (Oral)  Resp 20  SpO2 99%  Physical Exam  Nursing note and vitals reviewed. Constitutional: She is oriented to person, place, and  time. She appears well-developed and well-nourished.  HENT:  Head: Normocephalic and atraumatic.  Eyes: Conjunctivae and EOM are normal. Pupils are equal, round, and reactive to light.  Neck: Normal range of motion. Neck supple.  Cardiovascular: Normal rate and regular rhythm.   Pulmonary/Chest: Effort normal and breath sounds normal.  Abdominal: Soft. Bowel sounds are normal.  Musculoskeletal: Normal range of motion.  Neurological: She is alert and oriented to person, place, and time.  Skin:       Left hand: Wound is healing well. Packing in place. No obvious worsening infection.       Left lower extremity: Area of erythema approximately 2-3 cm in diameter in the distal lateral aspect.... left calf and thigh are nontender.  Psychiatric: She has a normal mood and affect.    ED Course  Procedures (including critical care time)  Labs Reviewed  CBC WITH DIFFERENTIAL - Abnormal; Notable for the following:    WBC 11.1 (*)     RBC 3.67 (*)     Hemoglobin 11.6 (*)     HCT 34.2 (*)     Neutrophils  Relative 95 (*)     Neutro Abs 10.5 (*)     Lymphocytes Relative 1 (*)     Lymphs Abs 0.1 (*)     All other components within normal limits  BASIC METABOLIC PANEL - Abnormal; Notable for the following:    Sodium 134 (*)     Glucose, Bld 304 (*)     GFR calc non Af Amer 60 (*)     GFR calc Af Amer 70 (*)     All other components within normal limits  GLUCOSE, CAPILLARY - Abnormal; Notable for the following:    Glucose-Capillary 281 (*)     All other components within normal limits   No results found.   1. Left hand pain       MDM  Patient is presently on Septra. Temperature here is 100.1. She does not appear septic or toxic. Left hand wound is healing nicely. She bumped her left lower extremity which is most likely the cause of the erythema. No clinical evidence of DVT. I discussed the scenario with Dr. Mina Marble on call. She will be discharged and followup in the office on Monday. She understands to return here if she notices any worsening fever, chills, redness, generally feeling of malaise       Donnetta Hutching, MD 06/26/12 [phone]

## 2012-06-27 LAB — ANAEROBIC CULTURE

## 2012-06-28 ENCOUNTER — Emergency Department (HOSPITAL_COMMUNITY): Payer: Medicare Other

## 2012-06-28 ENCOUNTER — Inpatient Hospital Stay (HOSPITAL_COMMUNITY)
Admission: EM | Admit: 2012-06-28 | Discharge: 2012-07-03 | DRG: 570 | Disposition: A | Discharge status: Home / Self Care | Payer: Medicare Other | Attending: Internal Medicine | Admitting: Internal Medicine

## 2012-06-28 ENCOUNTER — Encounter (HOSPITAL_COMMUNITY): Payer: Self-pay | Admitting: Emergency Medicine

## 2012-06-28 DIAGNOSIS — R609 Edema, unspecified: Secondary | ICD-10-CM | POA: Diagnosis present

## 2012-06-28 DIAGNOSIS — E872 Acidosis, unspecified: Secondary | ICD-10-CM | POA: Diagnosis present

## 2012-06-28 DIAGNOSIS — L02519 Cutaneous abscess of unspecified hand: Secondary | ICD-10-CM | POA: Diagnosis present

## 2012-06-28 DIAGNOSIS — R1310 Dysphagia, unspecified: Secondary | ICD-10-CM | POA: Diagnosis present

## 2012-06-28 DIAGNOSIS — Z880 Allergy status to penicillin: Secondary | ICD-10-CM | POA: Diagnosis not present

## 2012-06-28 DIAGNOSIS — E8809 Other disorders of plasma-protein metabolism, not elsewhere classified: Secondary | ICD-10-CM | POA: Diagnosis present

## 2012-06-28 DIAGNOSIS — D696 Thrombocytopenia, unspecified: Secondary | ICD-10-CM | POA: Diagnosis not present

## 2012-06-28 DIAGNOSIS — I10 Essential (primary) hypertension: Secondary | ICD-10-CM | POA: Diagnosis present

## 2012-06-28 DIAGNOSIS — J984 Other disorders of lung: Secondary | ICD-10-CM | POA: Diagnosis not present

## 2012-06-28 DIAGNOSIS — R509 Fever, unspecified: Secondary | ICD-10-CM | POA: Diagnosis present

## 2012-06-28 DIAGNOSIS — B950 Streptococcus, group A, as the cause of diseases classified elsewhere: Secondary | ICD-10-CM | POA: Diagnosis present

## 2012-06-28 DIAGNOSIS — G9341 Metabolic encephalopathy: Secondary | ICD-10-CM | POA: Diagnosis present

## 2012-06-28 DIAGNOSIS — L03119 Cellulitis of unspecified part of limb: Secondary | ICD-10-CM | POA: Diagnosis not present

## 2012-06-28 DIAGNOSIS — E871 Hypo-osmolality and hyponatremia: Secondary | ICD-10-CM | POA: Diagnosis present

## 2012-06-28 DIAGNOSIS — Z9861 Coronary angioplasty status: Secondary | ICD-10-CM

## 2012-06-28 DIAGNOSIS — Z79899 Other long term (current) drug therapy: Secondary | ICD-10-CM | POA: Diagnosis not present

## 2012-06-28 DIAGNOSIS — L270 Generalized skin eruption due to drugs and medicaments taken internally: Principal | ICD-10-CM | POA: Diagnosis present

## 2012-06-28 DIAGNOSIS — A419 Sepsis, unspecified organism: Secondary | ICD-10-CM | POA: Diagnosis not present

## 2012-06-28 DIAGNOSIS — L089 Local infection of the skin and subcutaneous tissue, unspecified: Secondary | ICD-10-CM | POA: Diagnosis not present

## 2012-06-28 DIAGNOSIS — Z7982 Long term (current) use of aspirin: Secondary | ICD-10-CM | POA: Diagnosis not present

## 2012-06-28 DIAGNOSIS — R9389 Abnormal findings on diagnostic imaging of other specified body structures: Secondary | ICD-10-CM | POA: Diagnosis not present

## 2012-06-28 DIAGNOSIS — E785 Hyperlipidemia, unspecified: Secondary | ICD-10-CM | POA: Diagnosis present

## 2012-06-28 DIAGNOSIS — R233 Spontaneous ecchymoses: Secondary | ICD-10-CM | POA: Diagnosis present

## 2012-06-28 DIAGNOSIS — R7989 Other specified abnormal findings of blood chemistry: Secondary | ICD-10-CM | POA: Diagnosis present

## 2012-06-28 DIAGNOSIS — Y92009 Unspecified place in unspecified non-institutional (private) residence as the place of occurrence of the external cause: Secondary | ICD-10-CM

## 2012-06-28 DIAGNOSIS — Z886 Allergy status to analgesic agent status: Secondary | ICD-10-CM | POA: Diagnosis not present

## 2012-06-28 DIAGNOSIS — T7840XA Allergy, unspecified, initial encounter: Secondary | ICD-10-CM | POA: Diagnosis present

## 2012-06-28 DIAGNOSIS — L03019 Cellulitis of unspecified finger: Secondary | ICD-10-CM | POA: Diagnosis not present

## 2012-06-28 DIAGNOSIS — N289 Disorder of kidney and ureter, unspecified: Secondary | ICD-10-CM | POA: Diagnosis present

## 2012-06-28 DIAGNOSIS — I252 Old myocardial infarction: Secondary | ICD-10-CM

## 2012-06-28 DIAGNOSIS — R4182 Altered mental status, unspecified: Secondary | ICD-10-CM | POA: Diagnosis not present

## 2012-06-28 DIAGNOSIS — J9819 Other pulmonary collapse: Secondary | ICD-10-CM | POA: Diagnosis not present

## 2012-06-28 DIAGNOSIS — R21 Rash and other nonspecific skin eruption: Secondary | ICD-10-CM | POA: Diagnosis not present

## 2012-06-28 DIAGNOSIS — Z8249 Family history of ischemic heart disease and other diseases of the circulatory system: Secondary | ICD-10-CM

## 2012-06-28 DIAGNOSIS — E119 Type 2 diabetes mellitus without complications: Secondary | ICD-10-CM | POA: Diagnosis not present

## 2012-06-28 DIAGNOSIS — I2510 Atherosclerotic heart disease of native coronary artery without angina pectoris: Secondary | ICD-10-CM | POA: Diagnosis present

## 2012-06-28 DIAGNOSIS — T370X5A Adverse effect of sulfonamides, initial encounter: Secondary | ICD-10-CM | POA: Diagnosis present

## 2012-06-28 DIAGNOSIS — R42 Dizziness and giddiness: Secondary | ICD-10-CM | POA: Diagnosis not present

## 2012-06-28 DIAGNOSIS — L0291 Cutaneous abscess, unspecified: Secondary | ICD-10-CM | POA: Diagnosis not present

## 2012-06-28 LAB — URINALYSIS, ROUTINE W REFLEX MICROSCOPIC
Glucose, UA: 250 mg/dL — AB
Ketones, ur: 15 mg/dL — AB
Leukocytes, UA: NEGATIVE
Nitrite: NEGATIVE
pH: 5 (ref 5.0–8.0)

## 2012-06-28 LAB — CBC WITH DIFFERENTIAL/PLATELET
Basophils Relative: 0 % (ref 0–1)
Eosinophils Relative: 3 % (ref 0–5)
Hemoglobin: 13 g/dL (ref 12.0–15.0)
Lymphocytes Relative: 3 % — ABNORMAL LOW (ref 12–46)
MCH: 32 pg (ref 26.0–34.0)
Monocytes Relative: 4 % (ref 3–12)
Neutrophils Relative %: 90 % — ABNORMAL HIGH (ref 43–77)
Platelets: ADEQUATE 10*3/uL (ref 150–400)
RBC: 4.06 MIL/uL (ref 3.87–5.11)
WBC: 6.2 10*3/uL (ref 4.0–10.5)

## 2012-06-28 LAB — BASIC METABOLIC PANEL
CO2: 17 mEq/L — ABNORMAL LOW (ref 19–32)
Chloride: 95 mEq/L — ABNORMAL LOW (ref 96–112)
Creatinine, Ser: 1.57 mg/dL — ABNORMAL HIGH (ref 0.50–1.10)
GFR calc Af Amer: 39 mL/min — ABNORMAL LOW (ref >90)
Potassium: 4.4 mEq/L (ref 3.5–5.1)
Sodium: 131 mEq/L — ABNORMAL LOW (ref 135–145)

## 2012-06-28 LAB — HEPATIC FUNCTION PANEL
Albumin: 2.4 g/dL — ABNORMAL LOW (ref 3.5–5.2)
Bilirubin, Direct: 0.2 mg/dL (ref 0.0–0.3)
Total Bilirubin: 0.3 mg/dL (ref 0.3–1.2)

## 2012-06-28 LAB — GLUCOSE, CAPILLARY: Glucose-Capillary: 429 mg/dL — ABNORMAL HIGH (ref 70–99)

## 2012-06-28 LAB — URINE MICROSCOPIC-ADD ON

## 2012-06-28 MED ORDER — INSULIN NPH (HUMAN) (ISOPHANE) 100 UNIT/ML ~~LOC~~ SUSP
10.0000 [IU] | Freq: Two times a day (BID) | SUBCUTANEOUS | Status: DC
  Administered 2012-06-28 – 2012-06-30 (×3): 10 [IU] via SUBCUTANEOUS
  Filled 2012-06-28: qty 10

## 2012-06-28 MED ORDER — SODIUM CHLORIDE 0.9 % IV BOLUS (SEPSIS)
1000.0000 mL | Freq: Once | INTRAVENOUS | Status: AC
  Administered 2012-06-28: 1000 mL via INTRAVENOUS

## 2012-06-28 MED ORDER — OXYCODONE-ACETAMINOPHEN 5-325 MG PO TABS
1.0000 | ORAL_TABLET | Freq: Four times a day (QID) | ORAL | Status: DC | PRN
  Administered 2012-06-29 – 2012-07-03 (×8): 1 via ORAL
  Filled 2012-06-28 (×9): qty 1

## 2012-06-28 MED ORDER — INSULIN ASPART 100 UNIT/ML ~~LOC~~ SOLN: 0.0000 [IU] | Freq: Every day | SUBCUTANEOUS | Status: DC

## 2012-06-28 MED ORDER — HYDROMORPHONE HCL PF 1 MG/ML IJ SOLN
0.5000 mg | INTRAMUSCULAR | Status: DC | PRN
  Administered 2012-07-01: 0.5 mg via INTRAVENOUS
  Filled 2012-06-28 (×2): qty 1

## 2012-06-28 MED ORDER — VANCOMYCIN HCL IN DEXTROSE 1-5 GM/200ML-% IV SOLN
1000.0000 mg | Freq: Once | INTRAVENOUS | Status: AC
  Administered 2012-06-28: 1000 mg via INTRAVENOUS
  Filled 2012-06-28: qty 200

## 2012-06-28 MED ORDER — NITROGLYCERIN 0.4 MG SL SUBL: 0.4000 mg | SUBLINGUAL_TABLET | SUBLINGUAL | Status: DC | PRN

## 2012-06-28 MED ORDER — MOXIFLOXACIN HCL IN NACL 400 MG/250ML IV SOLN
400.0000 mg | INTRAVENOUS | Status: DC
  Administered 2012-06-29: 400 mg via INTRAVENOUS
  Filled 2012-06-28: qty 250

## 2012-06-28 MED ORDER — BIOTENE DRY MOUTH MT LIQD
15.0000 mL | Freq: Two times a day (BID) | OROMUCOSAL | Status: DC
  Administered 2012-06-28 – 2012-07-03 (×9): 15 mL via OROMUCOSAL

## 2012-06-28 MED ORDER — INSULIN ASPART 100 UNIT/ML ~~LOC~~ SOLN: 0.0000 [IU] | Freq: Three times a day (TID) | SUBCUTANEOUS | Status: DC

## 2012-06-28 MED ORDER — WHITE PETROLATUM GEL
Status: AC
  Administered 2012-06-28: 23:00:00
  Filled 2012-06-28: qty 5

## 2012-06-28 MED ORDER — INSULIN ASPART 100 UNIT/ML ~~LOC~~ SOLN
0.0000 [IU] | Freq: Three times a day (TID) | SUBCUTANEOUS | Status: DC
  Administered 2012-06-28: 5 [IU] via SUBCUTANEOUS
  Administered 2012-06-29: 1 [IU] via SUBCUTANEOUS
  Administered 2012-06-30: 2 [IU] via SUBCUTANEOUS
  Administered 2012-06-30: 1 [IU] via SUBCUTANEOUS
  Administered 2012-07-01: 2 [IU] via SUBCUTANEOUS
  Administered 2012-07-02: 1 [IU] via SUBCUTANEOUS
  Administered 2012-07-02: 2 [IU] via SUBCUTANEOUS
  Administered 2012-07-02 – 2012-07-03 (×2): 1 [IU] via SUBCUTANEOUS

## 2012-06-28 MED ORDER — ONDANSETRON HCL 4 MG/2ML IJ SOLN: 4.0000 mg | Freq: Four times a day (QID) | INTRAMUSCULAR | Status: DC | PRN

## 2012-06-28 MED ORDER — SODIUM CHLORIDE 0.9 % IV SOLN
INTRAVENOUS | Status: AC
  Administered 2012-06-28: 16:00:00 via INTRAVENOUS

## 2012-06-28 MED ORDER — INSULIN ASPART 100 UNIT/ML ~~LOC~~ SOLN
0.0000 [IU] | Freq: Every day | SUBCUTANEOUS | Status: DC
  Administered 2012-06-28: 2 [IU] via SUBCUTANEOUS
  Administered 2012-06-29: 3 [IU] via SUBCUTANEOUS

## 2012-06-28 MED ORDER — ACETAMINOPHEN 325 MG PO TABS
650.0000 mg | ORAL_TABLET | Freq: Four times a day (QID) | ORAL | Status: DC | PRN
  Administered 2012-06-28 – 2012-07-03 (×2): 650 mg via ORAL
  Filled 2012-06-28 (×2): qty 2

## 2012-06-28 MED ORDER — ONDANSETRON HCL 4 MG PO TABS: 4.0000 mg | ORAL_TABLET | Freq: Four times a day (QID) | ORAL | Status: DC | PRN

## 2012-06-28 MED ORDER — SODIUM CHLORIDE 0.9 % IV SOLN
INTRAVENOUS | Status: DC
  Administered 2012-06-29 – 2012-07-01 (×4): via INTRAVENOUS

## 2012-06-28 NOTE — ED Notes (Signed)
Pt here c/o lethargy with rash and swelling to left leg; pt had I/D of left hand last week by Dr Merlyn Lot; per family pt not per norm; BP low and pt fell this am; pt with fever at home; redness noted to left leg

## 2012-06-28 NOTE — ED Provider Notes (Cosign Needed)
History     CSN: [id number]  Arrival date & time 06/28/12  1108   First MD Initiated Contact with Patient 06/28/12 1219      Chief Complaint  Patient presents with  . Fever  . Rash  . Leg Swelling    (Consider location/radiation/quality/duration/timing/severity/associated sxs/prior treatment) Patient is a 66 y.o. female presenting with fever, rash, and weakness. The history is provided by the patient (pt complains of weakness and fever and she passsed out and hit her head.  pt recently had surgery on left hand). No language interpreter was used.  Fever Primary symptoms of the febrile illness include fever and rash. Primary symptoms do not include fatigue, headaches, cough, abdominal pain or diarrhea. The current episode started yesterday. This is a new problem. The problem has not changed since onset. The fever began yesterday. The fever has been unchanged since its onset. The maximum temperature recorded prior to her arrival was 100 to 100.9 F.  The onset of the illness is associated with recent antibiotic use. Risk factors: dm. Rash   Weakness The primary symptoms include fever. Primary symptoms do not include headaches or seizures. The symptoms began 2 days ago. The symptoms are unchanged. The neurological symptoms are multifocal. Context: recent skin infection.  Additional symptoms include weakness. Additional symptoms do not include hallucinations.    Past Medical History  Diagnosis Date  . Coronary artery disease     s/p stent to LAD and LCx  . Hyperlipidemia   . Hypertension   . Diabetes mellitus   . Myocardial infarction     7 yrs ago    Past Surgical History  Procedure Date  . Coronary angioplasty with stent placement 04/26/2003    NORMAL. EF 65%  . Cesarean section   . I&d extremity 06/22/2012    Procedure: IRRIGATION AND DEBRIDEMENT EXTREMITY;  Surgeon: Tami Ribas, MD;  Location: [facility] OR;  Service: Orthopedics;  Laterality: Left;    Family History  Problem  Relation Age of Onset  . Heart attack Mother     X2  . Aneurysm Mother   . Heart attack Father     History  Substance Use Topics  . Smoking status: Never Smoker   . Smokeless tobacco: Not on file  . Alcohol Use: No    OB History    Grav Para Term Preterm Abortions TAB SAB Ect Mult Living                  Review of Systems  Constitutional: Positive for fever. Negative for fatigue.  HENT: Negative for congestion, sinus pressure and ear discharge.   Eyes: Negative for discharge.  Respiratory: Negative for cough.   Cardiovascular: Negative for chest pain.  Gastrointestinal: Negative for abdominal pain and diarrhea.  Genitourinary: Negative for frequency and hematuria.  Musculoskeletal: Negative for back pain.  Skin: Positive for rash.  Neurological: Positive for syncope and weakness. Negative for seizures and headaches.  Hematological: Negative.   Psychiatric/Behavioral: Negative for hallucinations.    Allergies  Codeine; Ibuprofen; Latex; and Penicillins  Home Medications   Current Outpatient Rx  Name Route Sig Dispense Refill  . ONDANSETRON HCL 8 MG PO TABS Oral Take by mouth every 4 (four) hours as needed. For nausea    . ASPIRIN 325 MG PO TBEC Oral Take 325 mg by mouth daily.      Marland Kitchen CALCIUM + D PO Oral Take 1 tablet by mouth daily.     . FENOFIBRATE MICRONIZED 200 MG PO  CAPS Oral Take 200 mg by mouth daily before breakfast.    . OMEGA-3 FATTY ACIDS 1000 MG PO CAPS Oral Take 3 g by mouth daily.      Marland Kitchen METFORMIN HCL 500 MG PO TABS Oral Take 500 mg by mouth 2 (two) times daily with a meal.      . METOPROLOL TARTRATE 25 MG PO TABS Oral Take 12.5 mg by mouth 2 (two) times daily.     Marland Kitchen NITROGLYCERIN 0.4 MG SL SUBL Sublingual Place 0.4 mg under the tongue every 5 (five) minutes as needed. For chest pain    . OXYCODONE-ACETAMINOPHEN 5-325 MG PO TABS Oral Take 1-2 tablets by mouth every 6 (six) hours as needed. For pain.    Marland Kitchen ROSUVASTATIN CALCIUM 20 MG PO TABS Oral Take 20  mg by mouth daily.    . SULFAMETHOXAZOLE-TMP DS 800-160 MG PO TABS Oral Take 1 tablet by mouth 2 (two) times daily.      BP 102/43  Pulse 96  Temp 100 F (37.8 C) (Oral)  Resp 18  Ht 5\' 2"  (1.575 m)  Wt 160 lb (72.576 kg)  BMI 29.26 kg/m2  SpO2 100%  Physical Exam  Constitutional: She is oriented to person, place, and time. She appears well-developed.  HENT:  Head: Normocephalic and atraumatic.  Eyes: Conjunctivae and EOM are normal. No scleral icterus.  Neck: Neck supple. No thyromegaly present.  Cardiovascular: Normal rate and regular rhythm.  Exam reveals no gallop and no friction rub.   No murmur heard. Pulmonary/Chest: No stridor. She has no wheezes. She has no rales. She exhibits no tenderness.  Abdominal: She exhibits no distension. There is no tenderness. There is no rebound.  Musculoskeletal: Normal range of motion. She exhibits no edema.  Lymphadenopathy:    She has no cervical adenopathy.  Neurological: She is oriented to person, place, and time. Coordination normal.  Skin: Rash noted. There is erythema.       Pt has a confluent rash to left foot and mac pap rash to extr. And torso.  Also rash to left hand and recently i and d of the left hand  Psychiatric: She has a normal mood and affect. Her behavior is normal.    ED Course  Procedures (including critical care time)  Labs Reviewed  CBC WITH DIFFERENTIAL - Abnormal; Notable for the following:    Neutrophils Relative 90 (*)     Lymphocytes Relative 3 (*)     Lymphs Abs 0.2 (*)     All other components within normal limits  BASIC METABOLIC PANEL - Abnormal; Notable for the following:    Sodium 131 (*)     Chloride 95 (*)     CO2 17 (*)     Glucose, Bld 434 (*)     BUN 41 (*)     Creatinine, Ser 1.57 (*)     GFR calc non Af Amer 33 (*)     GFR calc Af Amer 39 (*)     All other components within normal limits  URINALYSIS, ROUTINE W REFLEX MICROSCOPIC - Abnormal; Notable for the following:    APPearance  TURBID (*)     Glucose, UA 250 (*)     Bilirubin Urine MODERATE (*)     Ketones, ur 15 (*)     Protein, ur 100 (*)     All other components within normal limits  GLUCOSE, CAPILLARY - Abnormal; Notable for the following:    Glucose-Capillary 429 (*)  All other components within normal limits  HEPATIC FUNCTION PANEL - Abnormal; Notable for the following:    Total Protein 5.2 (*)     Albumin 2.4 (*)     AST 61 (*)     ALT 46 (*)     Indirect Bilirubin 0.1 (*)     All other components within normal limits  LACTIC ACID, PLASMA - Abnormal; Notable for the following:    Lactic Acid, Venous 4.2 (*)     All other components within normal limits  URINE MICROSCOPIC-ADD ON - Abnormal; Notable for the following:    Squamous Epithelial / LPF FEW (*)     Bacteria, UA FEW (*)     Casts GRANULAR CAST (*)     All other components within normal limits  PROCALCITONIN  CULTURE, BLOOD (ROUTINE X 2)  CULTURE, BLOOD (ROUTINE X 2)  URINE CULTURE   Ct Head Wo Contrast  06/28/2012  *RADIOLOGY REPORT*  Clinical Data:  Fall.  Altered mental status.  Fever  CT HEAD WITHOUT CONTRAST  Technique:  Contiguous axial images were obtained from the base of the skull through the vertex without contrast  Comparison:  None.  Findings:  The brain has a normal appearance without evidence for hemorrhage, acute infarction, hydrocephalus, or mass lesion.  There is no extra axial fluid collection.  The skull and paranasal sinuses are normal.  IMPRESSION: Normal CT of the head without contrast.   Original Report Authenticated By: Camelia Phenes, M.D.    Dg Chest Port 1 View  06/28/2012  *RADIOLOGY REPORT*  Clinical Data: Weakness.  Dizziness.  Hypertension.  Diabetic. Prior cardiac catheterization and coronary stents.  PORTABLE CHEST - 1 VIEW  Comparison: 06/22/2012  Findings: Numerous leads and wires project over the chest.  Midline trachea.  Borderline cardiomegaly.     Mediastinal contours otherwise within normal limits.   Mild right hemidiaphragm elevation. No pleural effusion or pneumothorax.  No congestive failure.  Patchy atelectasis or scarring at the left lung base is not significantly changed.  IMPRESSION:  1. No acute cardiopulmonary disease. 2.  Similar left base atelectasis or scarring. 3.  Borderline cardiomegaly, without acute disease.   Original Report Authenticated By: Consuello Bossier, M.D.      Dx sepsis  I spoke with dr. Timothy Lasso and he will admit pt to stepdown  MDM   sepsis  CRITICAL CARE Performed by: Cree Napoli L   Total critical care time: 45  Critical care time was exclusive of separately billable procedures and treating other patients.  Critical care was necessary to treat or prevent imminent or life-threatening deterioration.  Critical care was time spent personally by me on the following activities: development of treatment plan with patient and/or surrogate as well as nursing, discussions with consultants, evaluation of patient's response to treatment, examination of patient, obtaining history from patient or surrogate, ordering and performing treatments and interventions, ordering and review of laboratory studies, ordering and review of radiographic studies, pulse oximetry and re-evaluation of patient's condition.      Benny Lennert, MD 06/28/12 1447  Benny Lennert, MD 06/28/12 [phone]

## 2012-06-28 NOTE — ED Notes (Signed)
Checked patients blood sugar, it was 353, rn informed.

## 2012-06-28 NOTE — Progress Notes (Signed)
06/28/2012 4:28 PM Pt arrived from ed, vss, family at bedside. Safety discussed, bed low, and call bell within reach. Celesta Gentile

## 2012-06-28 NOTE — ED Notes (Signed)
Pt here after having hand surgery on last week. Family reports she had emergent surgery on the left hand last week and has been having high fevers and and episode of incontinence earlier today. Family reports they were seen here on Friday for the same symptoms, but pt seems to be worse today. Also reports a spot on her left foot that she noticed last week and has now spread further across the top of her foot. Pt also reports increased sweating and feeling "faint" today. Pt A&Ox4, denies any pain at this time. Skin warm, rash noted to arms, legs and back.

## 2012-06-28 NOTE — H&P (Signed)
[name] [name] is an 66 y.o. female.   PCP:   [name] Pounds, MD   Chief Complaint:  Confusion, Fever, Bactrim Reaction.  HPI: [name] S/P I and D and D/ced on Bactrim has had a tough week.  She has been intermittently confused, she has had some nausea requiring zofran, Wildly fluctuating temperatures 98-104, Weakness, Petechial Rash and other issues.  She also lacerated her foot and now has an eschar with ecchymoses and petechiae and no appreciated cellulitis.  L hand and wrist is wrapped and already evaluated by Dr [name] [name] - see his nores.  She is also Pink and has diffuse subcutaneous edema.  She is having some dysphagia but no angioedema, no tongue swelling, no wheezing and no hives.  Her rash is diffuse petechiael but not desquamating.  NO TEN appreciated.  Mild pain.  She did not remember that I saw her in the hospital last week.  Labs show Mild Hyponatremia.  WBC only 6.2.  She came to the ED today for eval and Rx after she went down at home.  She was Cxed up.  Bactrim stopped,. Vanco started.  Fluids given for mild hypotension.  i am evaluating her in step down.  Her daughter is a Engineer, mining here and helps c her HPI.    Past Medical History:  Past Medical History  Diagnosis Date  . Coronary artery disease     s/p stent to LAD and LCx  . Hyperlipidemia   . Hypertension   . Diabetes mellitus   . Myocardial infarction     7 yrs ago    Past Surgical History  Procedure Date  . Coronary angioplasty with stent placement 04/26/2003    NORMAL. EF 65%  . Cesarean section   . I&d extremity 06/22/2012    Procedure: IRRIGATION AND DEBRIDEMENT EXTREMITY;  Surgeon: Tami Ribas, MD;  Location: [facility] OR;  Service: Orthopedics;  Laterality: Left;      Allergies:   Allergies  Allergen Reactions  . Codeine Other (See Comments)    unknown  . Ibuprofen Other (See Comments)    unknown  . Latex Other (See Comments)    unknown  . Penicillins Other (See Comments)      unknown  . Ampicillin Hives, Swelling and Rash  . Bactrim (Sulfamethoxazole W-Trimethoprim) Hives, Swelling and Rash     Medications: Prior to Admission medications   Medication Sig Start Date End Date Taking? Authorizing Provider  aspirin 325 MG EC tablet Take 325 mg by mouth daily.     Yes Historical Provider, MD  Calcium Carbonate-Vitamin D (CALCIUM + D PO) Take 1 tablet by mouth daily.    Yes Historical Provider, MD  fenofibrate micronized (LOFIBRA) 200 MG capsule Take 200 mg by mouth daily before breakfast.   Yes Historical Provider, MD  fish oil-omega-3 fatty acids 1000 MG capsule Take 3 g by mouth daily.     Yes Historical Provider, MD  metFORMIN (GLUCOPHAGE) 500 MG tablet Take 500 mg by mouth 2 (two) times daily with a meal.     Yes Historical Provider, MD  metoprolol tartrate (LOPRESSOR) 25 MG tablet Take 12.5 mg by mouth 2 (two) times daily.    Yes Historical Provider, MD  neomycin-bacitracin-polymyxin (NEOSPORIN) [phone] ointment Apply 1 application topically daily.   Yes Historical Provider, MD  nitroGLYCERIN (NITROSTAT) 0.4 MG SL tablet Place 0.4 mg under the tongue every 5 (five) minutes as needed. For chest pain   Yes Historical Provider,  MD  ondansetron (ZOFRAN) 8 MG tablet Take 8 mg by mouth every 4 (four) hours as needed. For nausea   Yes Historical Provider, MD  oxyCODONE-acetaminophen (PERCOCET/ROXICET) 5-325 MG per tablet Take 1-2 tablets by mouth every 6 (six) hours as needed. For pain.   Yes Historical Provider, MD  rosuvastatin (CRESTOR) 20 MG tablet Take 20 mg by mouth daily.   Yes Historical Provider, MD     Medications Prior to Admission  Medication Sig Dispense Refill  . aspirin 325 MG EC tablet Take 325 mg by mouth daily.        . Calcium Carbonate-Vitamin D (CALCIUM + D PO) Take 1 tablet by mouth daily.       . fenofibrate micronized (LOFIBRA) 200 MG capsule Take 200 mg by mouth daily before breakfast.      . fish oil-omega-3 fatty acids 1000 MG  capsule Take 3 g by mouth daily.        . metFORMIN (GLUCOPHAGE) 500 MG tablet Take 500 mg by mouth 2 (two) times daily with a meal.        . metoprolol tartrate (LOPRESSOR) 25 MG tablet Take 12.5 mg by mouth 2 (two) times daily.       Marland Kitchen neomycin-bacitracin-polymyxin (NEOSPORIN) [phone] ointment Apply 1 application topically daily.      . nitroGLYCERIN (NITROSTAT) 0.4 MG SL tablet Place 0.4 mg under the tongue every 5 (five) minutes as needed. For chest pain      . ondansetron (ZOFRAN) 8 MG tablet Take 8 mg by mouth every 4 (four) hours as needed. For nausea      . oxyCODONE-acetaminophen (PERCOCET/ROXICET) 5-325 MG per tablet Take 1-2 tablets by mouth every 6 (six) hours as needed. For pain.      . rosuvastatin (CRESTOR) 20 MG tablet Take 20 mg by mouth daily.         Social History:  reports that she has never smoked. She does not have any smokeless tobacco history on file. She reports that she does not drink alcohol or use illicit drugs.  Family History: Family History  Problem Relation Age of Onset  . Heart attack Mother     X2  . Aneurysm Mother   . Heart attack Father     Review of Systems:  Review of Systems -  See HPI.  Some N. No V or D. Feels horrible. Confused. No CP or SOB or Itch. Full ROS reviewed c pt.  Physical Exam:  Blood pressure 107/50, pulse 97, temperature 100.8 F (38.2 C), temperature source Oral, resp. rate 26, height 5\' 2"  (1.575 m), weight 74.6 kg (164 lb 7.4 oz), SpO2 99.00%. Filed Vitals:   06/28/12 1443 06/28/12 1600 06/28/12 1700 06/28/12 1755  BP: 106/49 107/53 107/50   Pulse: 95 97 97   Temp: 100.2 F (37.9 C) 98.8 F (37.1 C)  100.8 F (38.2 C)  TempSrc: Tympanic Oral    Resp: 21 21 26    Height:  5\' 2"  (1.575 m)    Weight:  74.6 kg (164 lb 7.4 oz)    SpO2: 96% 99%     General appearance: Alert, confused Head: Normocephalic, without obvious abnormality, atraumatic Eyes: conjunctivae/corneas clear. PERRL, EOM's intact.  Nose: Nares  normal. Septum midline. Mucosa normal. No drainage or sinus tenderness. Throat: lips, mucosa, and tongue dry, Mild edema Neck: no adenopathy, no carotid bruit, no JVD and thyroid not enlarged, symmetric, no tenderness/mass/nodules Resp: CTA Cardio: Reg GI: soft, non-tender; bowel sounds normal; no masses,  no organomegaly Extremities: L Hand Wrapped and eval per Dr [name] [name]. Petechiael Rash throughout,  Edema SQ.  Pink skin chest.  No desquamating.  L Foot = eschar with ecchymoses and petechiae and no appreciated cellulitis. Pulses: 2+ and symmetric Lymph nodes: no cervical lymphadenopathy Neurologic: Confused generalized weakness but normal strength and tone. Normal symmetric reflexes.     Labs on Admission:   [facility] 06/28/12 1152 06/25/12 2211  NA 131* 134*  K 4.4 3.8  CL 95* 98  CO2 17* 24  GLUCOSE 434* 304*  BUN 41* 14  CREATININE 1.57* 0.96  CALCIUM 8.8 9.0  MG -- --  PHOS -- --    Basename 06/28/12 1317  AST 61*  ALT 46*  ALKPHOS 76  BILITOT 0.3  PROT 5.2*  ALBUMIN 2.4*   No results found for this basename: LIPASE:2,AMYLASE:2 in the last 72 hours  Basename 06/28/12 1152 06/25/12 2211  WBC 6.2 11.1*  NEUTROABS 5.6 10.5*  HGB 13.0 11.6*  HCT 37.3 34.2*  MCV 91.9 93.2  PLT PLATELETS APPEAR ADEQUATE 279   No results found for this basename: CKTOTAL:3,CKMB:3,CKMBINDEX:3,TROPONINI:3 in the last 72 hours No results found for this basename: INR,  PROTIME     LAB RESULT POCT:  Results for orders placed during the hospital encounter of 06/28/12  CBC WITH DIFFERENTIAL      Component Value Range   WBC 6.2  4.0 - 10.5 K/uL   RBC 4.06  3.87 - 5.11 MIL/uL   Hemoglobin 13.0  12.0 - 15.0 g/dL   HCT 11.9  14.7 - 82.9 %   MCV 91.9  78.0 - 100.0 fL   MCH 32.0  26.0 - 34.0 pg   MCHC 34.9  30.0 - 36.0 g/dL   RDW 56.2  13.0 - 86.5 %   Platelets PLATELETS APPEAR ADEQUATE  150 - 400 K/uL   Neutrophils Relative 90 (*) 43 - 77 %   Lymphocytes Relative 3 (*) 12 - 46 %    Monocytes Relative 4  3 - 12 %   Eosinophils Relative 3  0 - 5 %   Basophils Relative 0  0 - 1 %   Neutro Abs 5.6  1.7 - 7.7 K/uL   Lymphs Abs 0.2 (*) 0.7 - 4.0 K/uL   Monocytes Absolute 0.2  0.1 - 1.0 K/uL   Eosinophils Absolute 0.2  0.0 - 0.7 K/uL   Basophils Absolute 0.0  0.0 - 0.1 K/uL   WBC Morphology TOXIC GRANULATION     Smear Review LARGE PLATELETS PRESENT    BASIC METABOLIC PANEL      Component Value Range   Sodium 131 (*) 135 - 145 mEq/L   Potassium 4.4  3.5 - 5.1 mEq/L   Chloride 95 (*) 96 - 112 mEq/L   CO2 17 (*) 19 - 32 mEq/L   Glucose, Bld 434 (*) 70 - 99 mg/dL   BUN 41 (*) 6 - 23 mg/dL   Creatinine, Ser 7.84 (*) 0.50 - 1.10 mg/dL   Calcium 8.8  8.4 - 69.6 mg/dL   GFR calc non Af Amer 33 (*) >90 mL/min   GFR calc Af Amer 39 (*) >90 mL/min  URINALYSIS, ROUTINE W REFLEX MICROSCOPIC      Component Value Range   Color, Urine YELLOW  YELLOW   APPearance TURBID (*) CLEAR   Specific Gravity, Urine 1.030  1.005 - 1.030   pH 5.0  5.0 - 8.0   Glucose, UA 250 (*) NEGATIVE mg/dL   Hgb urine  dipstick NEGATIVE  NEGATIVE   Bilirubin Urine MODERATE (*) NEGATIVE   Ketones, ur 15 (*) NEGATIVE mg/dL   Protein, ur 782 (*) NEGATIVE mg/dL   Urobilinogen, UA 1.0  0.0 - 1.0 mg/dL   Nitrite NEGATIVE  NEGATIVE   Leukocytes, UA NEGATIVE  NEGATIVE  GLUCOSE, CAPILLARY      Component Value Range   Glucose-Capillary 429 (*) 70 - 99 mg/dL  HEPATIC FUNCTION PANEL      Component Value Range   Total Protein 5.2 (*) 6.0 - 8.3 g/dL   Albumin 2.4 (*) 3.5 - 5.2 g/dL   AST 61 (*) 0 - 37 U/L   ALT 46 (*) 0 - 35 U/L   Alkaline Phosphatase 76  39 - 117 U/L   Total Bilirubin 0.3  0.3 - 1.2 mg/dL   Bilirubin, Direct 0.2  0.0 - 0.3 mg/dL   Indirect Bilirubin 0.1 (*) 0.3 - 0.9 mg/dL  LACTIC ACID, PLASMA      Component Value Range   Lactic Acid, Venous 4.2 (*) 0.5 - 2.2 mmol/L  PROCALCITONIN      Component Value Range   Procalcitonin 69.85    URINE MICROSCOPIC-ADD ON      Component Value Range    Squamous Epithelial / LPF FEW (*) RARE   WBC, UA 0-2  <3 WBC/hpf   RBC / HPF 0-2  <3 RBC/hpf   Bacteria, UA FEW (*) RARE   Casts GRANULAR CAST (*) NEGATIVE   Urine-Other AMORPHOUS URATES/PHOSPHATES    GLUCOSE, CAPILLARY      Component Value Range   Glucose-Capillary 353 (*) 70 - 99 mg/dL      Radiological Exams on Admission: Ct Head Wo Contrast  06/28/2012  *RADIOLOGY REPORT*  Clinical Data:  Fall.  Altered mental status.  Fever  CT HEAD WITHOUT CONTRAST  Technique:  Contiguous axial images were obtained from the base of the skull through the vertex without contrast  Comparison:  None.  Findings:  The brain has a normal appearance without evidence for hemorrhage, acute infarction, hydrocephalus, or mass lesion.  There is no extra axial fluid collection.  The skull and paranasal sinuses are normal.  IMPRESSION: Normal CT of the head without contrast.   Original Report Authenticated By: Camelia Phenes, M.D.    Dg Chest Port 1 View  06/28/2012  *RADIOLOGY REPORT*  Clinical Data: Weakness.  Dizziness.  Hypertension.  Diabetic. Prior cardiac catheterization and coronary stents.  PORTABLE CHEST - 1 VIEW  Comparison: 06/22/2012  Findings: Numerous leads and wires project over the chest.  Midline trachea.  Borderline cardiomegaly.     Mediastinal contours otherwise within normal limits.  Mild right hemidiaphragm elevation. No pleural effusion or pneumothorax.  No congestive failure.  Patchy atelectasis or scarring at the left lung base is not significantly changed.  IMPRESSION:  1. No acute cardiopulmonary disease. 2.  Similar left base atelectasis or scarring. 3.  Borderline cardiomegaly, without acute disease.   Original Report Authenticated By: Consuello Bossier, M.D.       Orders placed in visit on 11/26/11  . EKG 12-LEAD     Assessment/Plan Principal Problem:  *Drug-induced hypersensitivity reaction Active Problems:  CAD (coronary artery disease)  Fever  Lactic acidosis  Hand  abscess  Hyponatremia  Hypertension  DM2 (diabetes mellitus, type 2)  Petechiae  Petechiae Rash c redness and SQ edema and associated c fever - Presumed Bactrim rash/Drug reaction- Bactrim now listed as allergy and D/ced.  No current indication for steroids.  Supportive care. ? Sepsis - Abx and IVF supportive care. Await Cxs. L Hand Infection/Cellulitis (MODERATE GROUP A STREP (S.PYOGENES) ISOLATED) - back on Vanco - watch for Redmans syndrome as this may complicate things.  ID to consult tomorrow. Hydrotherapy and pain meds.  ? Whether Clinda on D/c would be approp choice. HTN - BP low and S/P IVF. (Mostly third spacing and insensible losses) CAD - no Angina  Lipids - on meds.  DM2 - Hold Metformin as her Lactic acid is high and use NPH/ISS. Cr fine. She has had elevated CBGs. Her last A1C was @ 7.2.  DeHydration and lactic Acidosis - No Obvious DKA.  She is hungry and wants to eat. Dysphagia - pureed diet until better. She has Clumped and presumably used up platelets and will hold Anticoagulation for fear of Bleeding. Check PT/PTT etc. One Squeezer for DVT proph.  Afraid to pot on L Leg with her foot issue. Confusion - CCT (-).  Metabolic Encephalopathy O2 for low sats. Mild Renal Insuff - Hydrate. Mild Hyponatremia - follow  Twanna Resh M 06/28/2012, 6:40 PM

## 2012-06-28 NOTE — ED Notes (Signed)
Pt transported to CT ?

## 2012-06-28 NOTE — Progress Notes (Signed)
Subjective:     Patient reports pain as mild.  Started to feel unwell 8/23 and presented to ed that night.  At home over the weekend.  Reports fevers at home.  Had hydrotherapy on 8/22 and states it went well and hand looked good without erythema.  Rash on body began Friday and worsened over weekend.  Has noted ecchymosis around wound of left foot.  Objective: Vital signs in last 24 hours: Temp:  [98.8 F (37.1 C)-100.2 F (37.9 C)] 98.8 F (37.1 C) (08/26 1600) Pulse Rate:  [95-115] 97  (08/26 1600) Resp:  [16-23] 21  (08/26 1600) BP: (95-112)/(43-60) 107/53 mmHg (08/26 1600) SpO2:  [94 %-100 %] 99 % (08/26 1600) Weight:  [72.576 kg (160 lb)-74.6 kg (164 lb 7.4 oz)] 74.6 kg (164 lb 7.4 oz) (08/26 1600)  Intake/Output from previous day:   Intake/Output this shift:     Basename 06/28/12 1152 06/25/12 2211  HGB 13.0 11.6*    Basename 06/28/12 1152 06/25/12 2211  WBC 6.2 11.1*  RBC 4.06 3.67*  HCT 37.3 34.2*  PLT PLATELETS APPEAR ADEQUATE 279    Basename 06/28/12 1152 06/25/12 2211  NA 131* 134*  K 4.4 3.8  CL 95* 98  CO2 17* 24  BUN 41* 14  CREATININE 1.57* 0.96  GLUCOSE 434* 304*  CALCIUM 8.8 9.0   No results found for this basename: LABPT:2,INR:2 in the last 72 hours  left ue: light touch sensation and capillary refill intact all digits.  +epl/fpl/io.  wound with erythema surrounding.  erythema on dorsum of hand to wrist.  some erythema on dorsum of long and ring fingers.  no proximal streaks.  no fluctuance.  no purulence.  packing in place.  Assessment/Plan:     Patient has been restarted on vancomycin.  Rash on entire body may be related to bactrim.  Afebrile and wbc 6.2.  Will restart hydrotherapy.  Mission sling and iv pole for left hand.  Will recheck tomorrow.  If worsening of erythema, may consider return to or, but expect this will improve with hydrotherapy and packing change and iv abx.  Left foot may benefit from general ortho evaluation.  Ayat Drenning  R 06/28/2012, 5:30 PM

## 2012-06-28 NOTE — ED Notes (Signed)
Foley inserted.  Urine dark in color.  Patient states she has no allergies to iodine.  Insertion with good urine return.

## 2012-06-29 ENCOUNTER — Inpatient Hospital Stay (HOSPITAL_COMMUNITY): Payer: Medicare Other

## 2012-06-29 ENCOUNTER — Encounter (HOSPITAL_COMMUNITY): Payer: Self-pay

## 2012-06-29 ENCOUNTER — Encounter (HOSPITAL_COMMUNITY)
Admission: EM | Disposition: A | Discharge status: Home / Self Care | Payer: Self-pay | Source: Home / Self Care | Attending: Internal Medicine

## 2012-06-29 ENCOUNTER — Encounter (HOSPITAL_COMMUNITY): Payer: Self-pay | Admitting: Certified Registered"

## 2012-06-29 DIAGNOSIS — L03119 Cellulitis of unspecified part of limb: Secondary | ICD-10-CM | POA: Diagnosis not present

## 2012-06-29 DIAGNOSIS — L0390 Cellulitis, unspecified: Secondary | ICD-10-CM

## 2012-06-29 DIAGNOSIS — L02519 Cutaneous abscess of unspecified hand: Secondary | ICD-10-CM | POA: Diagnosis not present

## 2012-06-29 DIAGNOSIS — L0291 Cutaneous abscess, unspecified: Secondary | ICD-10-CM

## 2012-06-29 DIAGNOSIS — L03019 Cellulitis of unspecified finger: Secondary | ICD-10-CM | POA: Diagnosis not present

## 2012-06-29 HISTORY — PX: I & D EXTREMITY: SHX5045

## 2012-06-29 LAB — APTT: aPTT: 35 seconds (ref 24–37)

## 2012-06-29 LAB — COMPREHENSIVE METABOLIC PANEL
AST: 55 U/L — ABNORMAL HIGH (ref 0–37)
BUN: 37 mg/dL — ABNORMAL HIGH (ref 6–23)
CO2: 21 mEq/L (ref 19–32)
Calcium: 7.4 mg/dL — ABNORMAL LOW (ref 8.4–10.5)
Creatinine, Ser: 1.02 mg/dL (ref 0.50–1.10)
GFR calc non Af Amer: 56 mL/min — ABNORMAL LOW (ref >90)

## 2012-06-29 LAB — LACTIC ACID, PLASMA: Lactic Acid, Venous: 2 mmol/L (ref 0.5–2.2)

## 2012-06-29 LAB — GLUCOSE, CAPILLARY
Glucose-Capillary: 149 mg/dL — ABNORMAL HIGH (ref 70–99)
Glucose-Capillary: 257 mg/dL — ABNORMAL HIGH (ref 70–99)

## 2012-06-29 LAB — CBC
Hemoglobin: 11 g/dL — ABNORMAL LOW (ref 12.0–15.0)
MCH: 32.3 pg (ref 26.0–34.0)
RBC: 3.41 MIL/uL — ABNORMAL LOW (ref 3.87–5.11)
WBC: 6.6 10*3/uL (ref 4.0–10.5)

## 2012-06-29 LAB — PROTIME-INR
INR: 1.41 (ref 0.00–1.49)
Prothrombin Time: 17.5 seconds — ABNORMAL HIGH (ref 11.6–15.2)

## 2012-06-29 LAB — URINE CULTURE: Colony Count: NO GROWTH

## 2012-06-29 SURGERY — IRRIGATION AND DEBRIDEMENT EXTREMITY
Anesthesia: General | Site: Hand | Laterality: Left | Wound class: Dirty or Infected

## 2012-06-29 MED ORDER — FENTANYL CITRATE 0.05 MG/ML IJ SOLN
INTRAMUSCULAR | Status: DC | PRN
  Administered 2012-06-29: 50 ug via INTRAVENOUS

## 2012-06-29 MED ORDER — DIPHENHYDRAMINE HCL 25 MG PO CAPS
25.0000 mg | ORAL_CAPSULE | ORAL | Status: DC | PRN
  Administered 2012-06-29: 25 mg via ORAL
  Filled 2012-06-29: qty 1

## 2012-06-29 MED ORDER — DIPHENHYDRAMINE HCL 50 MG PO CAPS
50.0000 mg | ORAL_CAPSULE | Freq: Once | ORAL | Status: AC
  Administered 2012-06-29: 50 mg via ORAL
  Filled 2012-06-29: qty 1

## 2012-06-29 MED ORDER — VANCOMYCIN HCL IN DEXTROSE 1-5 GM/200ML-% IV SOLN
1000.0000 mg | INTRAVENOUS | Status: DC
  Administered 2012-06-29 – 2012-07-01 (×3): 1000 mg via INTRAVENOUS
  Filled 2012-06-29 (×3): qty 200

## 2012-06-29 MED ORDER — PROPOFOL 10 MG/ML IV EMUL
INTRAVENOUS | Status: DC | PRN
  Administered 2012-06-29: 200 mg via INTRAVENOUS

## 2012-06-29 MED ORDER — LACTATED RINGERS IV SOLN
INTRAVENOUS | Status: DC
  Administered 2012-06-29: 10:00:00 via INTRAVENOUS

## 2012-06-29 MED ORDER — SUCCINYLCHOLINE CHLORIDE 20 MG/ML IJ SOLN
INTRAMUSCULAR | Status: DC | PRN
  Administered 2012-06-29: 140 mg via INTRAVENOUS

## 2012-06-29 MED ORDER — SODIUM CHLORIDE 0.9 % IR SOLN
Status: DC | PRN
  Administered 2012-06-29: 3000 mL

## 2012-06-29 MED ORDER — LIDOCAINE HCL (CARDIAC) 20 MG/ML IV SOLN
INTRAVENOUS | Status: DC | PRN
  Administered 2012-06-29: 100 mg via INTRAVENOUS

## 2012-06-29 MED ORDER — BUPIVACAINE HCL (PF) 0.25 % IJ SOLN
INTRAMUSCULAR | Status: DC | PRN
  Administered 2012-06-29: 10 mL

## 2012-06-29 MED ORDER — SODIUM CHLORIDE 0.9 % IV SOLN
INTRAVENOUS | Status: DC | PRN
  Administered 2012-06-29: 11:00:00 via INTRAVENOUS

## 2012-06-29 MED ORDER — ONDANSETRON HCL 4 MG/2ML IJ SOLN
INTRAMUSCULAR | Status: DC | PRN
  Administered 2012-06-29: 4 mg via INTRAVENOUS

## 2012-06-29 MED ORDER — BUPIVACAINE HCL (PF) 0.25 % IJ SOLN
INTRAMUSCULAR | Status: AC
  Filled 2012-06-29: qty 30

## 2012-06-29 SURGICAL SUPPLY — 50 items
BANDAGE COBAN STERILE 2 (GAUZE/BANDAGES/DRESSINGS) IMPLANT
BANDAGE CONFORM 2  STR LF (GAUZE/BANDAGES/DRESSINGS) IMPLANT
BANDAGE ELASTIC 3 VELCRO ST LF (GAUZE/BANDAGES/DRESSINGS) ×2 IMPLANT
BANDAGE ELASTIC 4 VELCRO ST LF (GAUZE/BANDAGES/DRESSINGS) ×2 IMPLANT
BANDAGE GAUZE ELAST BULKY 4 IN (GAUZE/BANDAGES/DRESSINGS) ×2 IMPLANT
BNDG COHESIVE 1X5 TAN STRL LF (GAUZE/BANDAGES/DRESSINGS) IMPLANT
BNDG ESMARK 4X9 LF (GAUZE/BANDAGES/DRESSINGS) IMPLANT
CLOTH BEACON ORANGE TIMEOUT ST (SAFETY) ×2 IMPLANT
CORDS BIPOLAR (ELECTRODE) ×2 IMPLANT
COVER SURGICAL LIGHT HANDLE (MISCELLANEOUS) ×2 IMPLANT
DECANTER SPIKE VIAL GLASS SM (MISCELLANEOUS) IMPLANT
DRAIN PENROSE 1/4X12 LTX STRL (WOUND CARE) IMPLANT
DRSG ADAPTIC 3X8 NADH LF (GAUZE/BANDAGES/DRESSINGS) IMPLANT
DRSG EMULSION OIL 3X3 NADH (GAUZE/BANDAGES/DRESSINGS) IMPLANT
DRSG PAD ABDOMINAL 8X10 ST (GAUZE/BANDAGES/DRESSINGS) ×2 IMPLANT
GAUZE PACKING IODOFORM 1/4X5 (PACKING) ×2 IMPLANT
GAUZE XEROFORM 1X8 LF (GAUZE/BANDAGES/DRESSINGS) ×2 IMPLANT
GLOVE BIO SURGEON STRL SZ7.5 (GLOVE) IMPLANT
GLOVE BIOGEL PI IND STRL 8 (GLOVE) ×1 IMPLANT
GLOVE BIOGEL PI INDICATOR 8 (GLOVE) ×1
GOWN STRL REIN XL XLG (GOWN DISPOSABLE) ×2 IMPLANT
HANDPIECE INTERPULSE COAX TIP (DISPOSABLE)
KIT BASIN OR (CUSTOM PROCEDURE TRAY) ×2 IMPLANT
KIT ROOM TURNOVER OR (KITS) ×2 IMPLANT
LOOP VESSEL MAXI BLUE (MISCELLANEOUS) IMPLANT
LOOP VESSEL MINI RED (MISCELLANEOUS) IMPLANT
MANIFOLD NEPTUNE II (INSTRUMENTS) ×2 IMPLANT
NEEDLE HYPO 25X1 1.5 SAFETY (NEEDLE) ×2 IMPLANT
NS IRRIG 1000ML POUR BTL (IV SOLUTION) ×2 IMPLANT
PACK ORTHO EXTREMITY (CUSTOM PROCEDURE TRAY) ×2 IMPLANT
PAD ARMBOARD 7.5X6 YLW CONV (MISCELLANEOUS) ×4 IMPLANT
SCRUB BETADINE 4OZ XXX (MISCELLANEOUS) ×2 IMPLANT
SET HNDPC FAN SPRY TIP SCT (DISPOSABLE) IMPLANT
SET IRRIG Y TYPE TUR BLADDER L (SET/KITS/TRAYS/PACK) ×2 IMPLANT
SOLUTION BETADINE 4OZ (MISCELLANEOUS) ×2 IMPLANT
SPONGE GAUZE 4X4 12PLY (GAUZE/BANDAGES/DRESSINGS) ×2 IMPLANT
SPONGE LAP 18X18 X RAY DECT (DISPOSABLE) ×2 IMPLANT
SPONGE LAP 4X18 X RAY DECT (DISPOSABLE) IMPLANT
SUCTION FRAZIER TIP 10 FR DISP (SUCTIONS) IMPLANT
SUT ETHILON 4 0 PS 2 18 (SUTURE) IMPLANT
SUT MON AB 5-0 P3 18 (SUTURE) IMPLANT
SYR CONTROL 10ML LL (SYRINGE) ×2 IMPLANT
TOWEL OR 17X24 6PK STRL BLUE (TOWEL DISPOSABLE) ×2 IMPLANT
TOWEL OR 17X26 10 PK STRL BLUE (TOWEL DISPOSABLE) ×2 IMPLANT
TUBE ANAEROBIC SPECIMEN COL (MISCELLANEOUS) IMPLANT
TUBE CONNECTING 12X1/4 (SUCTIONS) ×2 IMPLANT
TUBE FEEDING 5FR 15 INCH (TUBING) IMPLANT
UNDERPAD 30X30 INCONTINENT (UNDERPADS AND DIAPERS) ×2 IMPLANT
WATER STERILE IRR 1000ML POUR (IV SOLUTION) ×2 IMPLANT
YANKAUER SUCT BULB TIP NO VENT (SUCTIONS) ×2 IMPLANT

## 2012-06-29 NOTE — Progress Notes (Signed)
ANTIBIOTIC CONSULT NOTE - INITIAL  Pharmacy Consult for vancomycin Indication: rule out sepsis, cellulitis  Allergies  Allergen Reactions  . Codeine Other (See Comments)    unknown  . Ibuprofen Other (See Comments)    unknown  . Latex Other (See Comments)    unknown  . Penicillins Other (See Comments)    unknown  . Ampicillin Hives, Swelling and Rash  . Bactrim (Sulfamethoxazole W-Trimethoprim) Hives, Swelling and Rash    Patient Measurements: Height: 5\' 2"  (157.5 cm) Weight: 170 lb 6.7 oz (77.3 kg) IBW/kg (Calculated) : 50.1    Vital Signs: Temp: 100.2 F (37.9 C) (08/27 0425) Temp src: Oral (08/27 0425) BP: 107/50 mmHg (08/27 0700) Pulse Rate: 98  (08/27 0700) Intake/Output from previous day: 08/26 0701 - 08/27 0700 In: 1345 [P.O.:120; I.V.:975; IV Piggyback:250] Out: 850 [Urine:850] Intake/Output from this shift:    Labs:  Basename 06/28/12 1152  WBC 6.2  HGB 13.0  PLT PLATELETS APPEAR ADEQUATE  LABCREA --  CREATININE 1.57*   Estimated Creatinine Clearance: 33.9 ml/min (by C-G formula based on Cr of 1.57). No results found for this basename: VANCOTROUGH:2,VANCOPEAK:2,VANCORANDOM:2,GENTTROUGH:2,GENTPEAK:2,GENTRANDOM:2,TOBRATROUGH:2,TOBRAPEAK:2,TOBRARND:2,AMIKACINPEAK:2,AMIKACINTROU:2,AMIKACIN:2, in the last 72 hours   Microbiology: Recent Results (from the past 720 hour(s))  SURGICAL PCR SCREEN     Status: Abnormal   Collection Time   06/22/12  7:14 PM      Component Value Range Status Comment   MRSA, PCR NEGATIVE  NEGATIVE Final    Staphylococcus aureus POSITIVE (*) NEGATIVE Final   ANAEROBIC CULTURE     Status: Normal   Collection Time   06/22/12  8:57 PM      Component Value Range Status Comment   Specimen Description ABSCESS HAND LEFT   Final    Special Requests NONE   Final    Gram Stain     Final    Value: RARE WBC PRESENT, PREDOMINANTLY PMN     RARE SQUAMOUS EPITHELIAL CELLS PRESENT     FEW GRAM POSITIVE COCCI     IN PAIRS   Culture NO  ANAEROBES ISOLATED   Final    Report Status 06/27/2012 FINAL   Final   CULTURE, ROUTINE-ABSCESS     Status: Normal   Collection Time   06/22/12  8:57 PM      Component Value Range Status Comment   Specimen Description ABSCESS HAND LEFT   Final    Special Requests NONE   Final    Gram Stain     Final    Value: RARE WBC PRESENT, PREDOMINANTLY PMN     RARE SQUAMOUS EPITHELIAL CELLS PRESENT     FEW GRAM POSITIVE COCCI     IN PAIRS   Culture MODERATE GROUP A STREP (S.PYOGENES) ISOLATED   Final    Report Status 06/25/2012 FINAL   Final   FUNGUS CULTURE W SMEAR     Status: Normal (Preliminary result)   Collection Time   06/22/12  8:57 PM      Component Value Range Status Comment   Specimen Description ABSCESS HAND LEFT   Final    Special Requests NONE   Final    Fungal Smear NO YEAST OR FUNGAL ELEMENTS SEEN   Final    Culture CULTURE IN PROGRESS FOR FOUR WEEKS   Final    Report Status PENDING   Incomplete   AFB CULTURE WITH SMEAR     Status: Normal (Preliminary result)   Collection Time   06/22/12  8:57 PM      Component Value  Range Status Comment   Specimen Description ABSCESS HAND LEFT   Final    Special Requests NONE   Final    ACID FAST SMEAR NO ACID FAST BACILLI SEEN   Final    Culture     Final    Value: CULTURE WILL BE EXAMINED FOR 6 WEEKS BEFORE ISSUING A FINAL REPORT   Report Status PENDING   Incomplete     Medical History: Past Medical History  Diagnosis Date  . Coronary artery disease     s/p stent to LAD and LCx  . Hyperlipidemia   . Hypertension   . Diabetes mellitus   . Myocardial infarction     7 yrs ago    Medications:  Prescriptions prior to admission  Medication Sig Dispense Refill  . aspirin 325 MG EC tablet Take 325 mg by mouth daily.        . Calcium Carbonate-Vitamin D (CALCIUM + D PO) Take 1 tablet by mouth daily.       . fenofibrate micronized (LOFIBRA) 200 MG capsule Take 200 mg by mouth daily before breakfast.      . fish oil-omega-3 fatty acids  1000 MG capsule Take 3 g by mouth daily.        . metFORMIN (GLUCOPHAGE) 500 MG tablet Take 500 mg by mouth 2 (two) times daily with a meal.        . metoprolol tartrate (LOPRESSOR) 25 MG tablet Take 12.5 mg by mouth 2 (two) times daily.       Marland Kitchen neomycin-bacitracin-polymyxin (NEOSPORIN) [phone] ointment Apply 1 application topically daily.      . nitroGLYCERIN (NITROSTAT) 0.4 MG SL tablet Place 0.4 mg under the tongue every 5 (five) minutes as needed. For chest pain      . ondansetron (ZOFRAN) 8 MG tablet Take 8 mg by mouth every 4 (four) hours as needed. For nausea      . oxyCODONE-acetaminophen (PERCOCET/ROXICET) 5-325 MG per tablet Take 1-2 tablets by mouth every 6 (six) hours as needed. For pain.      . rosuvastatin (CRESTOR) 20 MG tablet Take 20 mg by mouth daily.       Assessment: 66 yo lady s/p I&D hand abscess with strep who was dced on bactrim.  She presents to ED with intermittent confusion, fevers, and diffuse petechial rash to start vancomycin for cellulitis, r/o sepsis.  Goal of Therapy:  Vancomycin trough level 15-20 mcg/ml  Plan:  Vancomycin 1 gm IV q24 hours. F/u renal function, cultures and clinical condition. Check vancomycin trough when appropriate.  [name] [name] 06/29/2012,7:13 AM

## 2012-06-29 NOTE — Progress Notes (Signed)
Hydrotherapy Note    06/29/12 1100  Subjective Assessment  Subjective This all started with a rash after using lavendar scented bleach.    Patient and Family Stated Goals Wound Healing  Date of Onset 06/22/12  Prior Treatments I+D and one Outpatient WP.    Evaluation and Treatment  Evaluation and Treatment Procedures Explained to Patient/Family Yes  Evaluation and Treatment Procedures agreed to  Wound 06/29/12 Other (Comment) Hand Left;Other (Comment) Dorsum of L hand open I+D  Date First Assessed/Time First Assessed: 06/29/12 0900   Wound Type: Other (Comment)  Location: Hand  Location Orientation: Left;Other (Comment)  Wound Description (Comments): Dorsum of L hand open I+D  Present on Admission: Yes  Site / Wound Assessment Pink;Yellow  % Wound base Red or Granulating 30%  % Wound base Yellow 50%  % Wound base Other (Comment) 20% (Tendons and blood vessels)  Peri-wound Assessment Edema;Erythema (blanchable)  Wound Length (cm) 7 cm  Wound Width (cm) 1.6 cm  Wound Depth (cm) 0.7 cm  Undermining (cm) 2.8cm from 6:00-11:00 and 1.2cm from 1:00-6:00  Margins Unattacted edges (unapproximated)  Drainage Amount Moderate  Drainage Description Purulent;Serous  Treatment Hydrotherapy (Pulse lavage)  Dressing Type (MD entered when packing wound and stated pt to return to OR.)  Dressing Changed Changed  Dressing Status None  Hydrotherapy  Pulsed Lavage with Suction (psi) 4 psi  Pulsed Lavage with Suction - Normal Saline Used 1000 mL  Pulsed Lavage Tip Tip with splash shield  Pulsed lavage therapy - wound location Dorsum L hand  Wound Therapy - Assess/Plan/Recommendations  Wound Therapy - Clinical Statement pt presents with I+D wound on dorsum of L hand.  While packing wound MD entered and decided to take pt to OR for additional I+D.  Plain 4x4 and kerlix covering wound until return to OR.  Will hold any further Hydrotherapy until MD re-orders post-op.    Wound Therapy - Functional Problem  List Open infected wound on dorsum of L hand.    Factors Delaying/Impairing Wound Healing Infection - systemic/local;Polypharmacy  Hydrotherapy Plan Debridement;Dressing change;Patient/family education;Pulsatile lavage with suction;Whirlpool  Wound Therapy - Frequency 6X / week (pending MD re-order post-op)  Wound Therapy - Follow Up Recommendations [facility]  Wound Plan pt will be seen for Hydrotherapy to Dorsum of L hand to decrease slough and maximize granulation tissue for wound healing.  Will need MD re-order after this I+D.    Wound Therapy Goals - Improve the function of patient's integumentary system by progressing the wound(s) through the phases of wound healing by:  Decrease Necrotic Tissue to 30%  Decrease Necrotic Tissue - Progress Goal set today  Increase Granulation Tissue to 50%  Increase Granulation Tissue - Progress Goal set today  Improve Drainage Characteristics Min;Serous  Improve Drainage Characteristics - Progress Goal set today  Patient/Family will be able to  pt and family will be able to demonstrate UE positioning for edema control and dressing changes.    Patient/Family Instruction Goal - Progress Goal set today  Goals/treatment plan/discharge plan were made with and agreed upon by patient/family Yes  Time For Goal Achievement 7 days  Wound Therapy - Potential for Goals Good    [name] [name], PT [phone]

## 2012-06-29 NOTE — Progress Notes (Signed)
Subjective: Spiked temps to 103.2 and Avelox given and started. @ 30 min later she got swollen and had a "reaction"..pain meds and Benedryl given which helped. Trouble swallowing last night and diet = pureed. Insulin for CBGs. Also IVF for [facility].  Objective: Vital signs in last 24 hours: Temp:  [98.8 F (37.1 C)-103.2 F (39.6 C)] 100.2 F (37.9 C) (08/27 0425) Pulse Rate:  [94-115] 98  (08/27 0700) Resp:  [16-32] 25  (08/27 0700) BP: (94-112)/(43-60) 107/50 mmHg (08/27 0700) SpO2:  [93 %-100 %] 98 % (08/27 0700) Weight:  [72.576 kg (160 lb)-77.3 kg (170 lb 6.7 oz)] 77.3 kg (170 lb 6.7 oz) (08/27 0500) Weight change:  Last BM Date: 06/27/12  CBG (last 3)   Basename 06/28/12 2146 06/28/12 2017 06/28/12 1749  GLUCAP 241* 253* 298*    Intake/Output from previous day:  Intake/Output Summary (Last 24 hours) at 06/29/12 0725 Last data filed at 06/29/12 0700  Gross per 24 hour  Intake   1345 ml  Output    850 ml  Net    495 ml   08/26 0701 - 08/27 0700 In: 1345 [P.O.:120; I.V.:975; IV Piggyback:250] Out: 850 [Urine:850]   Physical Exam  General appearance: Lying in bed tired - in and out of sleep.  Wakes up and talks and is coherent. Eyes: no scleral icterus Throat: dry Resp: CTA Cardio: Reg GI: soft, non-tender; bowel sounds normal; no masses,  no organomegaly Extremities: less full body edema.  Petechiael rash noted. Foot @ the same. Hand wrapped.  Lab Results:  [facility] 06/28/12 1152  NA 131*  K 4.4  CL 95*  CO2 17*  GLUCOSE 434*  BUN 41*  CREATININE 1.57*  CALCIUM 8.8  MG --  PHOS --     Basename 06/28/12 1317  AST 61*  ALT 46*  ALKPHOS 76  BILITOT 0.3  PROT 5.2*  ALBUMIN 2.4*     Basename 06/28/12 1152  WBC 6.2  NEUTROABS 5.6  HGB 13.0  HCT 37.3  MCV 91.9  PLT PLATELETS APPEAR ADEQUATE    No results found for this basename: INR,  PROTIME    No results found for this basename: CKTOTAL:3,CKMB:3,CKMBINDEX:3,TROPONINI:3 in the last 72  hours  No results found for this basename: TSH,T4TOTAL,FREET3,T3FREE,THYROIDAB in the last 72 hours  No results found for this basename: VITAMINB12:2,FOLATE:2,FERRITIN:2,TIBC:2,IRON:2,RETICCTPCT:2 in the last 72 hours  Micro Results: Recent Results (from the past 240 hour(s))  SURGICAL PCR SCREEN     Status: Abnormal   Collection Time   06/22/12  7:14 PM      Component Value Range Status Comment   MRSA, PCR NEGATIVE  NEGATIVE Final    Staphylococcus aureus POSITIVE (*) NEGATIVE Final   ANAEROBIC CULTURE     Status: Normal   Collection Time   06/22/12  8:57 PM      Component Value Range Status Comment   Specimen Description ABSCESS HAND LEFT   Final    Special Requests NONE   Final    Gram Stain     Final    Value: RARE WBC PRESENT, PREDOMINANTLY PMN     RARE SQUAMOUS EPITHELIAL CELLS PRESENT     FEW GRAM POSITIVE COCCI     IN PAIRS   Culture NO ANAEROBES ISOLATED   Final    Report Status 06/27/2012 FINAL   Final   CULTURE, ROUTINE-ABSCESS     Status: Normal   Collection Time   06/22/12  8:57 PM      Component Value  Range Status Comment   Specimen Description ABSCESS HAND LEFT   Final    Special Requests NONE   Final    Gram Stain     Final    Value: RARE WBC PRESENT, PREDOMINANTLY PMN     RARE SQUAMOUS EPITHELIAL CELLS PRESENT     FEW GRAM POSITIVE COCCI     IN PAIRS   Culture MODERATE GROUP A STREP (S.PYOGENES) ISOLATED   Final    Report Status 06/25/2012 FINAL   Final   FUNGUS CULTURE W SMEAR     Status: Normal (Preliminary result)   Collection Time   06/22/12  8:57 PM      Component Value Range Status Comment   Specimen Description ABSCESS HAND LEFT   Final    Special Requests NONE   Final    Fungal Smear NO YEAST OR FUNGAL ELEMENTS SEEN   Final    Culture CULTURE IN PROGRESS FOR FOUR WEEKS   Final    Report Status PENDING   Incomplete   AFB CULTURE WITH SMEAR     Status: Normal (Preliminary result)   Collection Time   06/22/12  8:57 PM      Component Value Range  Status Comment   Specimen Description ABSCESS HAND LEFT   Final    Special Requests NONE   Final    ACID FAST SMEAR NO ACID FAST BACILLI SEEN   Final    Culture     Final    Value: CULTURE WILL BE EXAMINED FOR 6 WEEKS BEFORE ISSUING A FINAL REPORT   Report Status PENDING   Incomplete      Studies/Results: Ct Head Wo Contrast  06/28/2012  *RADIOLOGY REPORT*  Clinical Data:  Fall.  Altered mental status.  Fever  CT HEAD WITHOUT CONTRAST  Technique:  Contiguous axial images were obtained from the base of the skull through the vertex without contrast  Comparison:  None.  Findings:  The brain has a normal appearance without evidence for hemorrhage, acute infarction, hydrocephalus, or mass lesion.  There is no extra axial fluid collection.  The skull and paranasal sinuses are normal.  IMPRESSION: Normal CT of the head without contrast.   Original Report Authenticated By: Camelia Phenes, M.D.    Dg Chest Port 1 View  06/28/2012  *RADIOLOGY REPORT*  Clinical Data: Weakness.  Dizziness.  Hypertension.  Diabetic. Prior cardiac catheterization and coronary stents.  PORTABLE CHEST - 1 VIEW  Comparison: 06/22/2012  Findings: Numerous leads and wires project over the chest.  Midline trachea.  Borderline cardiomegaly.     Mediastinal contours otherwise within normal limits.  Mild right hemidiaphragm elevation. No pleural effusion or pneumothorax.  No congestive failure.  Patchy atelectasis or scarring at the left lung base is not significantly changed.  IMPRESSION:  1. No acute cardiopulmonary disease. 2.  Similar left base atelectasis or scarring. 3.  Borderline cardiomegaly, without acute disease.   Original Report Authenticated By: Consuello Bossier, M.D.      Medications: Scheduled:    . sodium chloride   Intravenous STAT  . antiseptic oral rinse  15 mL Mouth Rinse BID  . diphenhydrAMINE  50 mg Oral Once  . insulin aspart  0-5 Units Subcutaneous QHS  . insulin aspart  0-9 Units Subcutaneous TID WC  .  insulin NPH  10 Units Subcutaneous BID  . moxifloxacin  400 mg Intravenous Q24H  . sodium chloride  1,000 mL Intravenous Once  . sodium chloride  1,000 mL Intravenous Once  .  sodium chloride  1,000 mL Intravenous Once  . vancomycin  1,000 mg Intravenous Once  . vancomycin  1,000 mg Intravenous Q24H  . white petrolatum      . DISCONTD: insulin aspart  0-15 Units Subcutaneous TID WC  . DISCONTD: insulin aspart  0-5 Units Subcutaneous QHS   Continuous:    . sodium chloride 75 mL/hr at 06/29/12 0548     Assessment/Plan: Principal Problem:  *Drug-induced hypersensitivity reaction Active Problems:  CAD (coronary artery disease)  Fever  Lactic acidosis  Hand abscess  Hyponatremia  Hypertension  DM2 (diabetes mellitus, type 2)  Petechiae  Petechiae Rash/redness/SQ edema/fever - Presumed Bactrim rash/Drug reaction- Bactrim D/ced. No current indication for steroids. Supportive care.  ? Sepsis - Abx and IVF supportive care. Await Cxs. Repeat CXR with last nights spike.  She was cxed again and Avelox started - now D/Ced.  She had immediate reaction to Avelox and ? Allergy vrs inflammatory/immune modulated response.  Although I forgot to put Vanco on her list last night she did not miss any doses. L Hand Infection/Cellulitis (MODERATE GROUP A STREP (S.PYOGENES) ISOLATED) - Vanco -  ID to consult today. Hydrotherapy and pain meds. ? Whether Clinda on D/c would be approp choice.  HTN - BP low and S/P IVF. (Mostly third spacing and insensible losses). Hold antihypertensives. CAD - no Angina  Lipids - on meds -HELD.  DeHydration and lactic Acidosis - No Obvious DKA.  Follow up on labs this am. ? Hypoperfusion of gut with third spacing and insensible losses. Dysphagia - pureed diet until better.  She has Clumped and presumably used up platelets and will hold Anticoagulation for fear of Bleeding.  Check PT/PTT etc.  One Squeezer for DVT proph. Afraid to put on L Leg with her foot issue.    Confusion - CCT (-). Metabolic Encephalopathy from illness and Lactic acidosis O2 for low sats.  Mild Renal Insuff - Hydrate.  Mild Hyponatremia - follow DM2 - Hold Metformin as her Lactic acid is high and use NPH/ISS. Cr a little high. She has had elevated CBGs. Her last A1C was @ 7.2.  CBGs should improve with Rx-   Basename 06/28/12 2146 06/28/12 2017 06/28/12 1749  GLUCAP 241* 253* 298*    ID -  Anti-infectives     Start     Dose/Rate Route Frequency Ordered Stop   06/29/12 0730   vancomycin (VANCOCIN) IVPB 1000 mg/200 mL premix        1,000 mg 200 mL/hr over 60 Minutes Intravenous Every 24 hours 06/29/12 0703     06/29/12 0000   moxifloxacin (AVELOX) IVPB 400 mg        400 mg 250 mL/hr over 60 Minutes Intravenous Every 24 hours 06/28/12 2344     06/28/12 1430   vancomycin (VANCOCIN) IVPB 1000 mg/200 mL premix        1,000 mg 200 mL/hr over 60 Minutes Intravenous  Once 06/28/12 1422 06/28/12 1548          LOS: 1 day   Jaymari Cromie M 06/29/2012, 7:25 AM

## 2012-06-29 NOTE — Anesthesia Preprocedure Evaluation (Signed)
Anesthesia Evaluation  Patient identified by MRN, date of birth, ID band Patient awake    Reviewed: Allergy & Precautions, H&P , NPO status , Patient's Chart, lab work & pertinent test results, reviewed documented beta blocker date and time   Airway Mallampati: III    Mouth opening: Limited Mouth Opening  Dental  (+) Teeth Intact   Pulmonary          Cardiovascular hypertension, Pt. on medications and Pt. on home beta blockers + CAD, + Past MI and + Cardiac Stents (cx and rca) Rhythm:Regular Rate:Tachycardia     Neuro/Psych    GI/Hepatic   Endo/Other  Type 2, Oral Hypoglycemic Agents  Renal/GU      Musculoskeletal   Abdominal   Peds  Hematology   Anesthesia Other Findings   Reproductive/Obstetrics                           Anesthesia Physical Anesthesia Plan  ASA: III  Anesthesia Plan: General   Post-op Pain Management:    Induction: Intravenous  Airway Management Planned: LMA  Additional Equipment:   Intra-op Plan:   Post-operative Plan:   Informed Consent: I have reviewed the patients History and Physical, chart, labs and discussed the procedure including the risks, benefits and alternatives for the proposed anesthesia with the patient or authorized representative who has indicated his/her understanding and acceptance.   Dental advisory given  Plan Discussed with: Anesthesiologist and Surgeon  Anesthesia Plan Comments:         Anesthesia Quick Evaluation

## 2012-06-29 NOTE — Anesthesia Procedure Notes (Signed)
Procedure Name: Intubation Date/Time: 06/29/2012 10:49 AM Performed by: Arlice Colt B Pre-anesthesia Checklist: Patient identified, Emergency Drugs available, Suction available, Patient being monitored and Timeout performed Patient Re-evaluated:Patient Re-evaluated prior to inductionOxygen Delivery Method: Circle system utilized Preoxygenation: Pre-oxygenation with 100% oxygen Intubation Type: IV induction and Rapid sequence Ventilation: Mask ventilation without difficulty Grade View: Grade II Tube type: Oral Tube size: 7.5 mm Number of attempts: 2 (unable to view cords with MAC 3) Airway Equipment and Method: Video-laryngoscopy (grade two view with glidescope) Placement Confirmation: ETT inserted through vocal cords under direct vision,  positive ETCO2 and breath sounds checked- equal and bilateral Secured at: 21 cm Tube secured with: Tape Dental Injury: Teeth and Oropharynx as per pre-operative assessment

## 2012-06-29 NOTE — Progress Notes (Signed)
Pt arrived from PACU.  AOx3, VSS, family to bedside.  Drsg CDI to left hand.  Roselie Awkward, RN

## 2012-06-29 NOTE — Progress Notes (Signed)
Utilization review completed.  

## 2012-06-29 NOTE — Op Note (Signed)
Dictation [id number]

## 2012-06-29 NOTE — Progress Notes (Signed)
Orthopedic Tech Progress Note Patient Details:  [name] [name] 1945/12/06 [id number] Vertical Foam arm sling applied to Left UE for arm elevation to reduce swelling in fingers and hand. Order in nurse's orders. Family member present during application. Proper use and instructions given. Ortho Devices Type of Ortho Device: Arm foam sling;Other (comment) (Vertical Arm Sling) Ortho Device/Splint Location: Left UE Ortho Device/Splint Interventions: Application   Asia R Thompson 06/29/2012, 8:28 AM

## 2012-06-29 NOTE — Progress Notes (Signed)
Subjective: Fevers overnight.  Still feels ill.  Hydrotherapy at bedside, packing has been removed.     Objective: Vital signs in last 24 hours: Temp:  [98.8 F (37.1 C)-103.2 F (39.6 C)] 101.6 F (38.7 C) (08/27 0700) Pulse Rate:  [94-115] 103  (08/27 0800) Resp:  [16-32] 26  (08/27 0800) BP: (94-112)/(43-60) 110/50 mmHg (08/27 0800) SpO2:  [93 %-100 %] 97 % (08/27 0800) Weight:  [72.576 kg (160 lb)-77.3 kg (170 lb 6.7 oz)] 77.3 kg (170 lb 6.7 oz) (08/27 0500)  Intake/Output from previous day: 08/26 0701 - 08/27 0700 In: 1345 [P.O.:120; I.V.:975; IV Piggyback:250] Out: 850 [Urine:850] Intake/Output this shift: Total I/O In: 275 [I.V.:75; IV Piggyback:200] Out: -    Basename 06/29/12 0551 06/28/12 1152  HGB 11.0* 13.0    Basename 06/29/12 0551 06/28/12 1152  WBC 6.6 6.2  RBC 3.41* 4.06  HCT 31.4* 37.3  PLT 114* PLATELETS APPEAR ADEQUATE    Basename 06/29/12 0551 06/28/12 1152  NA 137 131*  K 4.1 4.4  CL 106 95*  CO2 21 17*  BUN 37* 41*  CREATININE 1.02 1.57*  GLUCOSE 104* 434*  CALCIUM 7.4* 8.8    Basename 06/29/12 0551  LABPT --  INR 1.41    light touch sensation and capillary refill intact all digits.  +epl/fpl/io.  erythema on dorsum of hand.  probing with swab reveals that tissue has started to close on deeper portions of wound.  also some drainage noted from proximal portion of long and ring finger.  erythema not significantly spread since evaluation yesterday.  Assessment/Plan: Left hand infection.  To OR for repeat Irrigation and debridement.  May require additional incisions or lengthening of incisions.  Discussed plan with patient and daughter.  Risks, benefits, and alternatives of surgery were discussed and the patient and dauther agree with the plan of care.  Consent signed by daughter due to pain medication for patient.    Raekwan Spelman R 06/29/2012, 9:37 AM

## 2012-06-29 NOTE — Transfer of Care (Signed)
Immediate Anesthesia Transfer of Care Note  Patient: [name] [name]  Procedure(s) Performed: Procedure(s) (LRB): IRRIGATION AND DEBRIDEMENT EXTREMITY (Left)  Patient Location: PACU  Anesthesia Type: General  Level of Consciousness: awake and alert   Airway & Oxygen Therapy: Patient Spontanous Breathing and Patient connected to face mask oxygen  Post-op Assessment: Report given to PACU RN and Post -op Vital signs reviewed and stable  Post vital signs: Reviewed and stable  Complications: No apparent anesthesia complications

## 2012-06-29 NOTE — Anesthesia Postprocedure Evaluation (Signed)
  Anesthesia Post-op Note  Patient: [name] [name]  Procedure(s) Performed: Procedure(s) (LRB): IRRIGATION AND DEBRIDEMENT EXTREMITY (Left)  Patient Location: PACU  Anesthesia Type: General  Level of Consciousness: awake  Airway and Oxygen Therapy: Patient Spontanous Breathing  Post-op Pain: mild  Post-op Assessment: Post-op Vital signs reviewed  Post-op Vital Signs: Reviewed  Complications: No apparent anesthesia complications

## 2012-06-29 NOTE — Progress Notes (Signed)
Dr. Timothy Lasso called and notified of pt's UOP 300cc since 0400.  Urine clear yellow, pt still with generalized edema.  MD aware, no new orders received.  Roselie Awkward, RN

## 2012-06-29 NOTE — Progress Notes (Signed)
INFECTIOUS DISEASE ATTENDING ADDENDUM:     [facility]   Date: 06/29/2012  Patient name: [name] [name]  Medical record number: [id number]  Date of birth: 01/14/1946    This patient has been seen and discussed with the house staff. Please see their note for complete details. I concur with their findings with the following additions/corrections:  Patient with group a streptococccal infection of hand sp surgery by Dr. Merlyn Lot. She was readmitted with severe TMP/SMX reaction with petechial rash, lactic acidosis. She received one dose of avelox IV and then afterwards had apparent angioedema, though not clear to me that this was due to avelox or evolution of the TMP/SMX hypersensitivity rash. She has returned from OR today again for repeat I and D of hand.  I agree with vancomycin while the pt is in house and she could be dc on oral clindamycin.  She needs help getting a rx drug plan given her multiple chronic conditions.  Acey Lav 06/29/2012, 4:25 PM

## 2012-06-30 ENCOUNTER — Encounter (HOSPITAL_COMMUNITY): Payer: Self-pay | Admitting: Orthopedic Surgery

## 2012-06-30 LAB — COMPREHENSIVE METABOLIC PANEL
AST: 28 U/L (ref 0–37)
BUN: 34 mg/dL — ABNORMAL HIGH (ref 6–23)
CO2: 21 mEq/L (ref 19–32)
Calcium: 7.1 mg/dL — ABNORMAL LOW (ref 8.4–10.5)
Creatinine, Ser: 0.76 mg/dL (ref 0.50–1.10)
GFR calc non Af Amer: 86 mL/min — ABNORMAL LOW (ref >90)
Total Bilirubin: 0.2 mg/dL — ABNORMAL LOW (ref 0.3–1.2)

## 2012-06-30 LAB — CBC
Hemoglobin: 10.1 g/dL — ABNORMAL LOW (ref 12.0–15.0)
MCH: 31.6 pg (ref 26.0–34.0)
Platelets: 88 10*3/uL — ABNORMAL LOW (ref 150–400)
RBC: 3.2 MIL/uL — ABNORMAL LOW (ref 3.87–5.11)
WBC: 7.7 10*3/uL (ref 4.0–10.5)

## 2012-06-30 LAB — GLUCOSE, CAPILLARY
Glucose-Capillary: 192 mg/dL — ABNORMAL HIGH (ref 70–99)
Glucose-Capillary: 118 mg/dL — ABNORMAL HIGH (ref 70–99)

## 2012-06-30 NOTE — Progress Notes (Signed)
06/30/2012 4:38 PM Pt voided a total of 75cc post foley removal. Scanned bladder and total was 225cc and pt is comfortable. Per post removal foley protocol will continue to monitor and re scan bladder in 2 hours. Celesta Gentile

## 2012-06-30 NOTE — Op Note (Signed)
[name] [name], [name] [name] NO.:  [id number]  MEDICAL RECORD NO.:  [id number]  LOCATION:  3302                         FACILITY:  [facility]  PHYSICIAN:  Betha Loa, MD        DATE OF BIRTH:  27-Aug-1946  DATE OF PROCEDURE:  06/29/2012 DATE OF DISCHARGE:                              OPERATIVE REPORT   PREOPERATIVE DIAGNOSIS:  Left hand infection.  POSTOPERATIVE DIAGNOSIS:  Left hand infection.  PROCEDURE:  Repeat irrigation and debridement of left hand infection With extension of wound and new incision on ring finger.  SURGEON:  Betha Loa, MD  ASSISTANT:  None.  ANESTHESIA:  General.  IV FLUIDS:  Per anesthesia flow sheet.  ESTIMATED BLOOD LOSS:  Minimal.  COMPLICATIONS:  None.  SPECIMENS:  Cultures to micro.  TOURNIQUET TIME:  30 minutes.  DISPOSITION:  Stable to PACU.  INDICATIONS:  [name] [name] is a 66 year old female who had an irrigation and debridement of left hand abscess 1 week ago.  She did well postoperatively and was discharged home the following day.  Over the weekend, she began to feel ill again.  She presented to the emergency department on Friday, was found to be okay and discharged home.  She returned to the emergency department yesterday and was admitted for sepsis.  She has been on IV antibiotics.  On evaluation, her hand was still reddened.  When probing with a swab, it was noted that the wound had started closing down.  I recommended to [name] [name] and her daughter taking her back to the operating room for irrigation and debridement of the hand and potential extension of her incisions.  Risks, benefits and alternatives of the surgery were discussed including the risk of blood loss; infection; damage to nerves, vessels, tendons, ligaments, bone; failure of surgery; need for additional surgery; complications with wound healing; continued pain; continued infection; need for repeat irrigation and debridement.  She voiced understanding of these  risks and elected to proceed.  OPERATIVE COURSE:  After being identified preoperatively by myself, the patient, the patient's daughter and I agreed upon the procedure and site of procedure.  Surgical site was marked.  The risks, benefits, and alternatives of the surgery were reviewed and they wished to proceed. Her doctor signed surgical consent because she has been on pain medications.  She was transferred to the operating room and placed on the operating room table in supine position with left upper extremity on an armboard.  General anesthesia was induced by the anesthesiologist. The left upper extremity was prepped and draped in normal sterile orthopedic fashion.  A surgical pause was performed between the surgeons, anesthesia, and operating room staff and all were in agreement as to the patient, procedure, and site of procedure.  Tourniquet at the proximal aspect of the extremities were inflated to 250 mmHg after gravity exsanguination of the limb.  The wound was explored.  There was no reaccumulation of purulence.  Culture swabs were taken and sent to micro for examination.  The wound was extended proximally at the wrist and a new wound was made over the proximal phalanx of the ring finger where there was increased erythema.  The wounds were  re-opened.  There was no reaccumulation of pus.  Curettes and Ray-Tec sponges were used to debride out any necrotic fat.  The skin edges at the initial wound appeared nonviable and these were removed sharply using the knife.  The wound was copiously irrigated with 1500 mL of sterile saline by cysto tubing.  The wound was then re-examined.  Curette was again used to remove any further devitalized tissue.  Once the wound bed looked good, an additional 1500 mL of sterile saline was irrigated through the wounds by cysto tubing.  The wounds were then all packed with 0.25-inch iodoform gauze.  A 10 mL of 0.25% plain Marcaine were injected around the  wound edges to aid in postoperative analgesia.  The wounds were then dressed with sterile Xeroform, 4x4s, and ABD, and wrapped with Kerlix.  A volar splint was placed and wrapped with Kerlix and an Ace bandage.  The tourniquet was deflated at 30 minutes.  The fingertips were pink with brisk capillary refill after deflation of the tourniquet. Operative drapes were broken down.  The patient was awakened from anesthesia safely.  She was transferred back to stretcher and taken to PACU in stable condition.  She will be kept for IV antibiotics.  We will start hydrotherapy in 2-3 days.     Betha Loa, MD     KK/MEDQ  D:  06/29/2012  T:  06/30/2012  Job:  [id number]

## 2012-06-30 NOTE — Progress Notes (Signed)
Subjective: F/Up Hand infection/Bactrim Reaction/?Toxic Shock vrs Sepsis. S/P another hand operation yesterday. Per op report no re-accumulation of pus, but some cellulitis.  Re-cultured. Looks better this am Some L hand pain Mild cough   Objective: Vital signs in last 24 hours: Temp:  [98 F (36.7 C)-102.4 F (39.1 C)] 98.9 F (37.2 C) (08/28 0700) Pulse Rate:  [85-117] 86  (08/28 0700) Resp:  [16-25] 16  (08/28 0700) BP: (90-124)/(42-64) 93/50 mmHg (08/28 0700) SpO2:  [81 %-98 %] 96 % (08/28 0700) Weight:  [80 kg (176 lb 5.9 oz)] 80 kg (176 lb 5.9 oz) (08/28 0529) Weight change: 7.425 kg (16 lb 5.9 oz) Last BM Date: 06/29/12  CBG (last 3)   Basename 06/29/12 2131 06/29/12 1650 06/29/12 1404  GLUCAP 257* 149* 137*    Intake/Output from previous day:  Intake/Output Summary (Last 24 hours) at 06/30/12 0817 Last data filed at 06/30/12 0700  Gross per 24 hour  Intake   2925 ml  Output   1045 ml  Net   1880 ml   08/27 0701 - 08/28 0700 In: 3200 [I.V.:3000; IV Piggyback:200] Out: 1045 [Urine:1025; Blood:20]   Physical Exam  General appearance: Lying in bed tired, more alert Eyes: no scleral icterus Throat: dry Resp: CTA Cardio: Reg GI: soft, non-tender; bowel sounds normal; no masses,  no organomegaly Extremities: less full body edema. But more foocal LE Edema. Petechiael rash noted appears a little better Hand wrapped and elevated  Lab Results:  Basename 06/30/12 0500 06/29/12 0551  NA 132* 137  K 4.3 4.1  CL 104 106  CO2 21 21  GLUCOSE 147* 104*  BUN 34* 37*  CREATININE 0.76 1.02  CALCIUM 7.1* 7.4*  MG -- --  PHOS -- --     Basename 06/30/12 0500 06/29/12 0551  AST 28 55*  ALT 25 34  ALKPHOS 69 65  BILITOT 0.2* 0.2*  PROT 3.9* 4.5*  ALBUMIN 1.6* 1.9*     Basename 06/30/12 0500 06/29/12 0551 06/28/12 1152  WBC 7.7 6.6 --  NEUTROABS -- -- 5.6  HGB 10.1* 11.0* --  HCT 29.4* 31.4* --  MCV 91.9 92.1 --  PLT 88* 114* --    Lab Results    Component Value Date   INR 1.41 06/29/2012    No results found for this basename: CKTOTAL:3,CKMB:3,CKMBINDEX:3,TROPONINI:3 in the last 72 hours  No results found for this basename: TSH,T4TOTAL,FREET3,T3FREE,THYROIDAB in the last 72 hours  No results found for this basename: VITAMINB12:2,FOLATE:2,FERRITIN:2,TIBC:2,IRON:2,RETICCTPCT:2 in the last 72 hours  Micro Results: Recent Results (from the past 240 hour(s))  SURGICAL PCR SCREEN     Status: Abnormal   Collection Time   06/22/12  7:14 PM      Component Value Range Status Comment   MRSA, PCR NEGATIVE  NEGATIVE Final    Staphylococcus aureus POSITIVE (*) NEGATIVE Final   ANAEROBIC CULTURE     Status: Normal   Collection Time   06/22/12  8:57 PM      Component Value Range Status Comment   Specimen Description ABSCESS HAND LEFT   Final    Special Requests NONE   Final    Gram Stain     Final    Value: RARE WBC PRESENT, PREDOMINANTLY PMN     RARE SQUAMOUS EPITHELIAL CELLS PRESENT     FEW GRAM POSITIVE COCCI     IN PAIRS   Culture NO ANAEROBES ISOLATED   Final    Report Status 06/27/2012 FINAL   Final  CULTURE, ROUTINE-ABSCESS     Status: Normal   Collection Time   06/22/12  8:57 PM      Component Value Range Status Comment   Specimen Description ABSCESS HAND LEFT   Final    Special Requests NONE   Final    Gram Stain     Final    Value: RARE WBC PRESENT, PREDOMINANTLY PMN     RARE SQUAMOUS EPITHELIAL CELLS PRESENT     FEW GRAM POSITIVE COCCI     IN PAIRS   Culture MODERATE GROUP A STREP (S.PYOGENES) ISOLATED   Final    Report Status 06/25/2012 FINAL   Final   FUNGUS CULTURE W SMEAR     Status: Normal (Preliminary result)   Collection Time   06/22/12  8:57 PM      Component Value Range Status Comment   Specimen Description ABSCESS HAND LEFT   Final    Special Requests NONE   Final    Fungal Smear NO YEAST OR FUNGAL ELEMENTS SEEN   Final    Culture CULTURE IN PROGRESS FOR FOUR WEEKS   Final    Report Status PENDING    Incomplete   AFB CULTURE WITH SMEAR     Status: Normal (Preliminary result)   Collection Time   06/22/12  8:57 PM      Component Value Range Status Comment   Specimen Description ABSCESS HAND LEFT   Final    Special Requests NONE   Final    ACID FAST SMEAR NO ACID FAST BACILLI SEEN   Final    Culture     Final    Value: CULTURE WILL BE EXAMINED FOR 6 WEEKS BEFORE ISSUING A FINAL REPORT   Report Status PENDING   Incomplete   CULTURE, BLOOD (ROUTINE X 2)     Status: Normal (Preliminary result)   Collection Time   06/28/12 12:40 PM      Component Value Range Status Comment   Specimen Description BLOOD FOREARM RIGHT   Final    Special Requests BOTTLES DRAWN AEROBIC ONLY   Final    Culture  Setup Time 06/28/2012 18:03   Final    Culture     Final    Value:        BLOOD CULTURE RECEIVED NO GROWTH TO DATE CULTURE WILL BE HELD FOR 5 DAYS BEFORE ISSUING A FINAL NEGATIVE REPORT   Report Status PENDING   Incomplete   URINE CULTURE     Status: Normal   Collection Time   06/28/12  1:10 PM      Component Value Range Status Comment   Specimen Description URINE, CATHETERIZED   Final    Special Requests NONE   Final    Culture  Setup Time 06/28/2012 20:21   Final    Colony Count NO GROWTH   Final    Culture NO GROWTH   Final    Report Status 06/29/2012 FINAL   Final   CULTURE, BLOOD (ROUTINE X 2)     Status: Normal (Preliminary result)   Collection Time   06/28/12  1:20 PM      Component Value Range Status Comment   Specimen Description BLOOD HAND RIGHT   Final    Special Requests BOTTLES DRAWN AEROBIC ONLY 8CC   Final    Culture  Setup Time 06/28/2012 18:24   Final    Culture     Final    Value:        BLOOD CULTURE RECEIVED  NO GROWTH TO DATE CULTURE WILL BE HELD FOR 5 DAYS BEFORE ISSUING A FINAL NEGATIVE REPORT   Report Status PENDING   Incomplete   WOUND CULTURE     Status: Normal (Preliminary result)   Collection Time   06/29/12 11:06 AM      Component Value Range Status Comment    Specimen Description WOUND LEFT HAND   Final    Special Requests DORSAL PART OF LEFT HAND   Final    Gram Stain     Final    Value: FEW WBC PRESENT,BOTH PMN AND MONONUCLEAR     NO SQUAMOUS EPITHELIAL CELLS SEEN     NO ORGANISMS SEEN   Culture NO GROWTH 1 DAY   Final    Report Status PENDING   Incomplete      Studies/Results: Ct Head Wo Contrast  06/28/2012  *RADIOLOGY REPORT*  Clinical Data:  Fall.  Altered mental status.  Fever  CT HEAD WITHOUT CONTRAST  Technique:  Contiguous axial images were obtained from the base of the skull through the vertex without contrast  Comparison:  None.  Findings:  The brain has a normal appearance without evidence for hemorrhage, acute infarction, hydrocephalus, or mass lesion.  There is no extra axial fluid collection.  The skull and paranasal sinuses are normal.  IMPRESSION: Normal CT of the head without contrast.   Original Report Authenticated By: Camelia Phenes, M.D.    Dg Chest Port 1 View  06/29/2012  *RADIOLOGY REPORT*  Clinical Data: Fever and swelling.  PORTABLE CHEST - 1 VIEW  Comparison: 06/28/2012  Findings: There is some increased density at the left lung base likely representing atelectasis.  Probable underlying chronic lung disease is stable.  Heart size is normal.  No edema or pleural fluid.  IMPRESSION: Increased atelectasis at the left lung base.   Original Report Authenticated By: Reola Calkins, M.D.    Dg Chest Port 1 View  06/28/2012  *RADIOLOGY REPORT*  Clinical Data: Weakness.  Dizziness.  Hypertension.  Diabetic. Prior cardiac catheterization and coronary stents.  PORTABLE CHEST - 1 VIEW  Comparison: 06/22/2012  Findings: Numerous leads and wires project over the chest.  Midline trachea.  Borderline cardiomegaly.     Mediastinal contours otherwise within normal limits.  Mild right hemidiaphragm elevation. No pleural effusion or pneumothorax.  No congestive failure.  Patchy atelectasis or scarring at the left lung base is not  significantly changed.  IMPRESSION:  1. No acute cardiopulmonary disease. 2.  Similar left base atelectasis or scarring. 3.  Borderline cardiomegaly, without acute disease.   Original Report Authenticated By: Consuello Bossier, M.D.      Medications: Scheduled:    . antiseptic oral rinse  15 mL Mouth Rinse BID  . insulin aspart  0-5 Units Subcutaneous QHS  . insulin aspart  0-9 Units Subcutaneous TID WC  . insulin NPH  10 Units Subcutaneous BID  . vancomycin  1,000 mg Intravenous Q24H   Continuous:    . sodium chloride 75 mL/hr at 06/29/12 2346  . DISCONTD: lactated ringers Stopped (06/29/12 1112)     Assessment/Plan: Principal Problem:  *Drug-induced hypersensitivity reaction Active Problems:  CAD (coronary artery disease)  Fever  Lactic acidosis  Hand abscess  Hyponatremia  Hypertension  DM2 (diabetes mellitus, type 2)  Petechiae  She appears to finally be getting better.  L Hand infection/Bactrim Reaction/(MODERATE GROUP A STREP (S.PYOGENES) ISOLATED)/?Toxic Shock vrs Sepsis - POD #1 - finally looking better.  Wound care per Ortho. Continue  Abx.  Agree with ID.  Oral Clinda?  Petechiae Rash/redness/SQ edema/fever - Presumed Bactrim rash/Drug reaction- Bactrim D/ced. Supportive care.    HTN - BP low and S/P IVF. (Mostly third spacing and insensible losses). Hold antihypertensives. IVF.  CAD - no Angina   Lipids - on meds -HELD.   DeHydration and lactic Acidosis - Better.  No Obvious DKA.  Presumed Hypoperfusion of gut with third spacing and insensible losses.  Dysphagia - pureed diet until better. Getting waffle soon - lets see if she can tolerate.  Thrombocytopenia c platelet 88-114 and used up with her petechial rash.  Holding anticoagulation for fear of Bleeding.  Skin looks a little better.  One Squeezer for DVT proph. Afraid to put on L Leg with her foot issue.   Confusion - CCT (-). Metabolic Encephalopathy from illness and Lactic acidosis  O2 for low  sats.   Mild Renal Insuff - Hydrate. Cr is better.  Mild Hyponatremia - follow. 132-137 last 2 days.  DM2 - Hold Metformin as her Lactic acid is high and use NPH/ISS.  CBGs improving.  Basename 06/29/12 2131 06/29/12 1650 06/29/12 1404  GLUCAP 257* 149* 137*    ID -  Anti-infectives     Start     Dose/Rate Route Frequency Ordered Stop   06/29/12 0730   vancomycin (VANCOCIN) IVPB 1000 mg/200 mL premix        1,000 mg 200 mL/hr over 60 Minutes Intravenous Every 24 hours 06/29/12 0703     06/29/12 0000   moxifloxacin (AVELOX) IVPB 400 mg  Status:  Discontinued        400 mg 250 mL/hr over 60 Minutes Intravenous Every 24 hours 06/28/12 2344 06/29/12 0726   06/28/12 1430   vancomycin (VANCOCIN) IVPB 1000 mg/200 mL premix        1,000 mg 200 mL/hr over 60 Minutes Intravenous  Once 06/28/12 1422 06/28/12 1548          LOS: 2 days   Chanse Kagel M 06/30/2012, 8:17 AM

## 2012-06-30 NOTE — Progress Notes (Signed)
Subjective: 1 Day Post-Op Procedure(s) (LRB): IRRIGATION AND DEBRIDEMENT EXTREMITY (Left) Patient reports pain as burning pain in hand when elevated in mission sling.  feels improved, but still tired.  looks improved and more conversant today..    Objective: Vital signs in last 24 hours: Temp:  [98 F (36.7 C)-102.4 F (39.1 C)] 98.9 F (37.2 C) (08/28 0700) Pulse Rate:  [85-117] 86  (08/28 0700) Resp:  [16-25] 16  (08/28 0700) BP: (90-124)/(42-64) 93/50 mmHg (08/28 0700) SpO2:  [81 %-98 %] 96 % (08/28 0700) Weight:  [80 kg (176 lb 5.9 oz)] 80 kg (176 lb 5.9 oz) (08/28 0529)  Intake/Output from previous day: 08/27 0701 - 08/28 0700 In: 3200 [I.V.:3000; IV Piggyback:200] Out: 1045 [Urine:1025; Blood:20] Intake/Output this shift: Total I/O In: 75 [I.V.:75] Out: -    Basename 06/30/12 0500 06/29/12 0551 06/28/12 1152  HGB 10.1* 11.0* 13.0    Basename 06/30/12 0500 06/29/12 0551  WBC 7.7 6.6  RBC 3.20* 3.41*  HCT 29.4* 31.4*  PLT 88* 114*    Basename 06/30/12 0500 06/29/12 0551  NA 132* 137  K 4.3 4.1  CL 104 106  CO2 21 21  BUN 34* 37*  CREATININE 0.76 1.02  GLUCOSE 147* 104*  CALCIUM 7.1* 7.4*    Basename 06/29/12 0551  LABPT --  INR 1.41    intact sensation and capillary refill all digits.  wiggles digits.  dressing c/d/i.  no erythema visible outside of dressing.  Assessment/Plan: 1 Day Post-Op Procedure(s) (LRB): IRRIGATION AND DEBRIDEMENT EXTREMITY (Left) Start hydrotherapy tomorrow.  Will plan hydrotherapy every other day.  If home on Friday, recommend hydrotherapy that day prior to d/c, otherwise will have hydrotherapy on Saturday.  Abx per ID.   Jeb Schloemer R 06/30/2012, 11:41 AM

## 2012-06-30 NOTE — Progress Notes (Signed)
[facility]    Date of Admission:  06/28/2012   Total days of antibiotics:9        Day 3: Vancomycin          Principal Problem:  *Drug-induced hypersensitivity reaction Active Problems:  CAD (coronary artery disease)  Fever  Lactic acidosis  Hand abscess  Hyponatremia  Hypertension  DM2 (diabetes mellitus, type 2)  Petechiae  . antiseptic oral rinse  15 mL Mouth Rinse BID  . insulin aspart  0-5 Units Subcutaneous QHS  . insulin aspart  0-9 Units Subcutaneous TID WC  . insulin NPH  10 Units Subcutaneous BID  . vancomycin  1,000 mg Intravenous Q24H   Subjective: Pt is feeling better. Febrile and Tmax of 102.4. She denies any chest pain, cough, SOB, abdominal pain , dysuria or diarrhea. Edema improved.  Objective: Temp:  [98 F (36.7 C)-102.4 F (39.1 C)] 98.9 F (37.2 C) (08/28 0700) Pulse Rate:  [85-117] 86  (08/28 0700) Resp:  [16-25] 16  (08/28 0700) BP: (90-124)/(42-64) 93/50 mmHg (08/28 0700) SpO2:  [81 %-98 %] 96 % (08/28 0700) Weight:  [80 kg (176 lb 5.9 oz)] 80 kg (176 lb 5.9 oz) (08/28 0529) General: resting in bed  HEENT: PERRL, EOMI, no scleral icterus, swollen face improved Cardiac: RRR, no rubs, murmurs or gallops  Pulm: clear to auscultation bilaterally, moving normal volumes of air  Abd: soft, nontender, nondistended, BS present  Ext: warm and well perfused, pedal edema 1+ , Left hand dressing clean, dry, intact. petechial rash on bilateral knees and legs (lt>rt)and erythematous ulcer on dorsal part of th left foot, dry and clean base. Neuro: alert and oriented X3, cranial nerves II-XII grossly intact  Lab Results Lab Results  Component Value Date   WBC 7.7 06/30/2012   HGB 10.1* 06/30/2012   HCT 29.4* 06/30/2012   MCV 91.9 06/30/2012   PLT 88* 06/30/2012    Lab Results  Component Value Date   CREATININE 0.76 06/30/2012   BUN 34* 06/30/2012   NA 132* 06/30/2012   K 4.3 06/30/2012   CL 104 06/30/2012   CO2 21 06/30/2012    Lab Results  Component Value Date   ALT 25 06/30/2012   AST 28 06/30/2012   ALKPHOS 69 06/30/2012   BILITOT 0.2* 06/30/2012    Microbiology: Recent Results (from the past 240 hour(s))  SURGICAL PCR SCREEN     Status: Abnormal   Collection Time   06/22/12  7:14 PM      Component Value Range Status Comment   MRSA, PCR NEGATIVE  NEGATIVE Final    Staphylococcus aureus POSITIVE (*) NEGATIVE Final   ANAEROBIC CULTURE     Status: Normal   Collection Time   06/22/12  8:57 PM      Component Value Range Status Comment   Specimen Description ABSCESS HAND LEFT   Final    Special Requests NONE   Final    Gram Stain     Final    Value: RARE WBC PRESENT, PREDOMINANTLY PMN     RARE SQUAMOUS EPITHELIAL CELLS PRESENT     FEW GRAM POSITIVE COCCI     IN PAIRS   Culture NO ANAEROBES ISOLATED   Final    Report Status 06/27/2012 FINAL   Final   CULTURE, ROUTINE-ABSCESS     Status: Normal   Collection Time   06/22/12  8:57 PM      Component Value Range Status Comment   Specimen Description  ABSCESS HAND LEFT   Final    Special Requests NONE   Final    Gram Stain     Final    Value: RARE WBC PRESENT, PREDOMINANTLY PMN     RARE SQUAMOUS EPITHELIAL CELLS PRESENT     FEW GRAM POSITIVE COCCI     IN PAIRS   Culture MODERATE GROUP A STREP (S.PYOGENES) ISOLATED   Final    Report Status 06/25/2012 FINAL   Final   FUNGUS CULTURE W SMEAR     Status: Normal (Preliminary result)   Collection Time   06/22/12  8:57 PM      Component Value Range Status Comment   Specimen Description ABSCESS HAND LEFT   Final    Special Requests NONE   Final    Fungal Smear NO YEAST OR FUNGAL ELEMENTS SEEN   Final    Culture CULTURE IN PROGRESS FOR FOUR WEEKS   Final    Report Status PENDING   Incomplete   AFB CULTURE WITH SMEAR     Status: Normal (Preliminary result)   Collection Time   06/22/12  8:57 PM      Component Value Range Status Comment   Specimen Description ABSCESS HAND LEFT   Final    Special Requests NONE    Final    ACID FAST SMEAR NO ACID FAST BACILLI SEEN   Final    Culture     Final    Value: CULTURE WILL BE EXAMINED FOR 6 WEEKS BEFORE ISSUING A FINAL REPORT   Report Status PENDING   Incomplete   CULTURE, BLOOD (ROUTINE X 2)     Status: Normal (Preliminary result)   Collection Time   06/28/12 12:40 PM      Component Value Range Status Comment   Specimen Description BLOOD FOREARM RIGHT   Final    Special Requests BOTTLES DRAWN AEROBIC ONLY   Final    Culture  Setup Time 06/28/2012 18:03   Final    Culture     Final    Value:        BLOOD CULTURE RECEIVED NO GROWTH TO DATE CULTURE WILL BE HELD FOR 5 DAYS BEFORE ISSUING A FINAL NEGATIVE REPORT   Report Status PENDING   Incomplete   URINE CULTURE     Status: Normal   Collection Time   06/28/12  1:10 PM      Component Value Range Status Comment   Specimen Description URINE, CATHETERIZED   Final    Special Requests NONE   Final    Culture  Setup Time 06/28/2012 20:21   Final    Colony Count NO GROWTH   Final    Culture NO GROWTH   Final    Report Status 06/29/2012 FINAL   Final   CULTURE, BLOOD (ROUTINE X 2)     Status: Normal (Preliminary result)   Collection Time   06/28/12  1:20 PM      Component Value Range Status Comment   Specimen Description BLOOD HAND RIGHT   Final    Special Requests BOTTLES DRAWN AEROBIC ONLY 8CC   Final    Culture  Setup Time 06/28/2012 18:24   Final    Culture     Final    Value:        BLOOD CULTURE RECEIVED NO GROWTH TO DATE CULTURE WILL BE HELD FOR 5 DAYS BEFORE ISSUING A FINAL NEGATIVE REPORT   Report Status PENDING   Incomplete   CULTURE, BLOOD (ROUTINE X 2)  Status: Normal (Preliminary result)   Collection Time   06/29/12 12:15 AM      Component Value Range Status Comment   Specimen Description BLOOD RIGHT HAND   Final    Special Requests BOTTLES DRAWN AEROBIC AND ANAEROBIC 5CC EACH   Final    Culture  Setup Time 06/29/2012 04:32   Final    Culture     Final    Value:        BLOOD CULTURE  RECEIVED NO GROWTH TO DATE CULTURE WILL BE HELD FOR 5 DAYS BEFORE ISSUING A FINAL NEGATIVE REPORT   Report Status PENDING   Incomplete   CULTURE, BLOOD (ROUTINE X 2)     Status: Normal (Preliminary result)   Collection Time   06/29/12 12:20 AM      Component Value Range Status Comment   Specimen Description BLOOD LEFT ARM   Final    Special Requests BOTTLES DRAWN AEROBIC AND ANAEROBIC 5CC EACH   Final    Culture  Setup Time 06/29/2012 04:32   Final    Culture     Final    Value:        BLOOD CULTURE RECEIVED NO GROWTH TO DATE CULTURE WILL BE HELD FOR 5 DAYS BEFORE ISSUING A FINAL NEGATIVE REPORT   Report Status PENDING   Incomplete   WOUND CULTURE     Status: Normal (Preliminary result)   Collection Time   06/29/12 11:06 AM      Component Value Range Status Comment   Specimen Description WOUND LEFT HAND   Final    Special Requests DORSAL PART OF LEFT HAND   Final    Gram Stain     Final    Value: FEW WBC PRESENT,BOTH PMN AND MONONUCLEAR     NO SQUAMOUS EPITHELIAL CELLS SEEN     NO ORGANISMS SEEN   Culture NO GROWTH 1 DAY   Final    Report Status PENDING   Incomplete     Studies/Results: Ct Head Wo Contrast  06/28/2012  *RADIOLOGY REPORT*  Clinical Data:  Fall.  Altered mental status.  Fever  CT HEAD WITHOUT CONTRAST  Technique:  Contiguous axial images were obtained from the base of the skull through the vertex without contrast  Comparison:  None.  Findings:  The brain has a normal appearance without evidence for hemorrhage, acute infarction, hydrocephalus, or mass lesion.  There is no extra axial fluid collection.  The skull and paranasal sinuses are normal.  IMPRESSION: Normal CT of the head without contrast.   Original Report Authenticated By: Camelia Phenes, M.D.    Dg Chest Port 1 View  06/29/2012  *RADIOLOGY REPORT*  Clinical Data: Fever and swelling.  PORTABLE CHEST - 1 VIEW  Comparison: 06/28/2012  Findings: There is some increased density at the left lung base likely  representing atelectasis.  Probable underlying chronic lung disease is stable.  Heart size is normal.  No edema or pleural fluid.  IMPRESSION: Increased atelectasis at the left lung base.   Original Report Authenticated By: Reola Calkins, M.D.    Dg Chest Port 1 View  06/28/2012  *RADIOLOGY REPORT*  Clinical Data: Weakness.  Dizziness.  Hypertension.  Diabetic. Prior cardiac catheterization and coronary stents.  PORTABLE CHEST - 1 VIEW  Comparison: 06/22/2012  Findings: Numerous leads and wires project over the chest.  Midline trachea.  Borderline cardiomegaly.     Mediastinal contours otherwise within normal limits.  Mild right hemidiaphragm elevation. No pleural effusion or pneumothorax.  No congestive failure.  Patchy atelectasis or scarring at the left lung base is not significantly changed.  IMPRESSION:  1. No acute cardiopulmonary disease. 2.  Similar left base atelectasis or scarring. 3.  Borderline cardiomegaly, without acute disease.   Original Report Authenticated By: Consuello Bossier, M.D.    Assessment & Plan by Problem:  [name] [name] is a 66 y.o. female PMH of DM type 2, CAD s/p coronary angioplasty with stent placement on 2004 was admitted to [facility]. [name] [name] 1 week before (06/22/12) with group A Step. Pyogens causing abscess on left hand. I and D done by Dr. Merlyn Lot on the same day on 8/20. Discharged to home with oral bactrim. But appeared petechial rash on bilateral knees and legs (lt>rt) and redness associated with fever 103.2 and readmitted on 8/26 with possible causes of allergic reaction from Bactrim and discontinued. Avelox given and within half an hour edema developed all over the body. Possible reaction from Avelox or previously given Bactrim. Symptoms improved with Benedryl. And restarted vancomycin.   S/p 2nd I and D on left hand yesterday. Pt has been febrile and Tmax 102.4 , BP unstable Pt desatures to 86 on this morning and WBC 7.7 this morning lab. Possible causes of sepsis  from infection or drug hypersensitivity. Pt does not look toxic though. Chest x-ray from 8/27 shows increased atelectasis at the left lung base.   1. Continue Vancomycin1,000 mg, IV, Q24H  2. F/u blood culture from 8/27  3. F/u wound culture from 8/27 4. F/u CBC and BMP 5. Encouraged to use Incentive spirometer frequently   Junious Silk  06/30/2012, 9:39 AM    INFECTIOUS DISEASE ATTENDING ADDENDUM:     [facility]   Date: 06/30/2012  Patient name: [name] [name]  Medical record number: [id number]  Date of birth: June 25, 1946    This patient has been seen and discussed with the house staff. Please see their note for complete details. I concur with their findings with the following additions/corrections:  Patient looks much improved today, rash better edema better. Her TTpenis is now manifest without the platelet clumping. Continue vancomycin for now.  Acey Lav 06/30/2012, 5:57 PM

## 2012-07-01 LAB — GLUCOSE, CAPILLARY
Glucose-Capillary: 111 mg/dL — ABNORMAL HIGH (ref 70–99)
Glucose-Capillary: 169 mg/dL — ABNORMAL HIGH (ref 70–99)

## 2012-07-01 LAB — COMPREHENSIVE METABOLIC PANEL
Albumin: 1.6 g/dL — ABNORMAL LOW (ref 3.5–5.2)
Alkaline Phosphatase: 98 U/L (ref 39–117)
BUN: 23 mg/dL (ref 6–23)
CO2: 20 mEq/L (ref 19–32)
Chloride: 106 mEq/L (ref 96–112)
Creatinine, Ser: 0.55 mg/dL (ref 0.50–1.10)
GFR calc Af Amer: >90 mL/min (ref >90)
GFR calc non Af Amer: >90 mL/min (ref >90)
Glucose, Bld: 85 mg/dL (ref 70–99)
Potassium: 4 mEq/L (ref 3.5–5.1)
Total Bilirubin: 0.3 mg/dL (ref 0.3–1.2)

## 2012-07-01 LAB — CBC
MCV: 92.5 fL (ref 78.0–100.0)
Platelets: 69 10*3/uL — ABNORMAL LOW (ref 150–400)
RBC: 2.94 MIL/uL — ABNORMAL LOW (ref 3.87–5.11)
RDW: 14.8 % (ref 11.5–15.5)
WBC: 8 10*3/uL (ref 4.0–10.5)

## 2012-07-01 LAB — WOUND CULTURE

## 2012-07-01 MED ORDER — LORATADINE 10 MG PO TABS
10.0000 mg | ORAL_TABLET | Freq: Every day | ORAL | Status: DC
  Administered 2012-07-01 – 2012-07-03 (×3): 10 mg via ORAL
  Filled 2012-07-01 (×3): qty 1

## 2012-07-01 MED ORDER — HYDROMORPHONE HCL PF 1 MG/ML IJ SOLN
0.5000 mg | Freq: Once | INTRAMUSCULAR | Status: AC
  Administered 2012-07-01: 0.5 mg via INTRAVENOUS
  Filled 2012-07-01: qty 1

## 2012-07-01 MED ORDER — VANCOMYCIN HCL IN DEXTROSE 1-5 GM/200ML-% IV SOLN
1000.0000 mg | Freq: Two times a day (BID) | INTRAVENOUS | Status: DC
  Administered 2012-07-01 – 2012-07-03 (×4): 1000 mg via INTRAVENOUS
  Filled 2012-07-01 (×6): qty 200

## 2012-07-01 MED ORDER — INSULIN NPH (HUMAN) (ISOPHANE) 100 UNIT/ML ~~LOC~~ SUSP
4.0000 [IU] | Freq: Every day | SUBCUTANEOUS | Status: DC
  Administered 2012-07-01 – 2012-07-02 (×2): 4 [IU] via SUBCUTANEOUS

## 2012-07-01 MED ORDER — ATORVASTATIN CALCIUM 40 MG PO TABS
40.0000 mg | ORAL_TABLET | Freq: Every day | ORAL | Status: DC
  Filled 2012-07-01: qty 1

## 2012-07-01 MED ORDER — ATORVASTATIN CALCIUM 20 MG PO TABS
20.0000 mg | ORAL_TABLET | Freq: Every day | ORAL | Status: DC
  Administered 2012-07-01 – 2012-07-02 (×2): 20 mg via ORAL
  Filled 2012-07-01 (×3): qty 1

## 2012-07-01 MED ORDER — HYDROMORPHONE HCL PF 1 MG/ML IJ SOLN
0.5000 mg | INTRAMUSCULAR | Status: DC
  Administered 2012-07-03: 0.5 mg via INTRAVENOUS

## 2012-07-01 MED ORDER — SODIUM CHLORIDE 0.9 % IV SOLN: 0.5000 mg/h | Freq: Once | INTRAVENOUS | Status: DC

## 2012-07-01 NOTE — Progress Notes (Signed)
Pt's dtr at bedside and notices new redness/splotchiness on bilateral lower extremeties. This has changed since yesterday, also some peeling noted on right chest. Will notify MD of these changes.

## 2012-07-01 NOTE — Progress Notes (Signed)
[facility]    Date of Admission:  06/28/2012                                                                                      Total days of antibiotics:10                                                                                                                                              Day 4: Vancomycin     Principal Problem:  *Drug-induced hypersensitivity reaction Active Problems:  CAD (coronary artery disease)  Fever  Lactic acidosis  Hand abscess  Hyponatremia  Hypertension  DM2 (diabetes mellitus, type 2)  Petechiae  . antiseptic oral rinse  15 mL Mouth Rinse BID  . insulin aspart  0-5 Units Subcutaneous QHS  . insulin aspart  0-9 Units Subcutaneous TID WC  . insulin NPH  4 Units Subcutaneous QAC breakfast  . loratadine  10 mg Oral Daily  . vancomycin  1,000 mg Intravenous Q24H  . DISCONTD: insulin NPH  10 Units Subcutaneous BID   Subjective: Pt is feeling better. Afebrile and Tmax of 98.3. She denies any chest pain, cough, SOB, abdominal pain , dysuria or diarrhea. Edema improved.   Objective: Temp:  [97.6 F (36.4 C)-98.3 F (36.8 C)] 98.3 F (36.8 C) (08/29 0740) Pulse Rate:  [80-84] 81  (08/29 0740) Resp:  [15-20] 20  (08/29 0740) BP: (99-117)/(48-62) 99/51 mmHg (08/29 0740) SpO2:  [92 %-97 %] 97 % (08/29 0740) Weight:  [81.6 kg (179 lb 14.3 oz)] 81.6 kg (179 lb 14.3 oz) (08/29 0312) General: resting in bed  HEENT: PERRL, EOMI, no scleral icterus, swollen face improved  Cardiac: RRR, no rubs, murmurs or gallops  Pulm: clear to auscultation bilaterally, moving normal volumes of air  Abd: soft, nontender, nondistended, BS present  Ext: warm and well perfused, pedal edema 1+ , Left hand dressing clean, dry, intact. petechial rash on bilateral knees and legs (lt>rt)and erythematous ulcer on dorsal part of th left foot, dry and clean base.  Neuro: alert and oriented X3, cranial nerves II-XII grossly intact  Lab  Results Lab Results  Component Value Date   WBC 8.0 07/01/2012   HGB 9.4* 07/01/2012   HCT 27.2* 07/01/2012   MCV 92.5 07/01/2012   PLT 69* 07/01/2012    Lab Results  Component Value Date   CREATININE 0.55 07/01/2012   BUN 23 07/01/2012   NA 134* 07/01/2012   K 4.0 07/01/2012   CL 106 07/01/2012  CO2 20 07/01/2012    Lab Results  Component Value Date   ALT 39* 07/01/2012   AST 59* 07/01/2012   ALKPHOS 98 07/01/2012   BILITOT 0.3 07/01/2012      Microbiology: Recent Results (from the past 240 hour(s))  SURGICAL PCR SCREEN     Status: Abnormal   Collection Time   06/22/12  7:14 PM      Component Value Range Status Comment   MRSA, PCR NEGATIVE  NEGATIVE Final    Staphylococcus aureus POSITIVE (*) NEGATIVE Final   ANAEROBIC CULTURE     Status: Normal   Collection Time   06/22/12  8:57 PM      Component Value Range Status Comment   Specimen Description ABSCESS HAND LEFT   Final    Special Requests NONE   Final    Gram Stain     Final    Value: RARE WBC PRESENT, PREDOMINANTLY PMN     RARE SQUAMOUS EPITHELIAL CELLS PRESENT     FEW GRAM POSITIVE COCCI     IN PAIRS   Culture NO ANAEROBES ISOLATED   Final    Report Status 06/27/2012 FINAL   Final   CULTURE, ROUTINE-ABSCESS     Status: Normal   Collection Time   06/22/12  8:57 PM      Component Value Range Status Comment   Specimen Description ABSCESS HAND LEFT   Final    Special Requests NONE   Final    Gram Stain     Final    Value: RARE WBC PRESENT, PREDOMINANTLY PMN     RARE SQUAMOUS EPITHELIAL CELLS PRESENT     FEW GRAM POSITIVE COCCI     IN PAIRS   Culture MODERATE GROUP A STREP (S.PYOGENES) ISOLATED   Final    Report Status 06/25/2012 FINAL   Final   FUNGUS CULTURE W SMEAR     Status: Normal (Preliminary result)   Collection Time   06/22/12  8:57 PM      Component Value Range Status Comment   Specimen Description ABSCESS HAND LEFT   Final    Special Requests NONE   Final    Fungal Smear NO YEAST OR FUNGAL ELEMENTS SEEN    Final    Culture CULTURE IN PROGRESS FOR FOUR WEEKS   Final    Report Status PENDING   Incomplete   AFB CULTURE WITH SMEAR     Status: Normal (Preliminary result)   Collection Time   06/22/12  8:57 PM      Component Value Range Status Comment   Specimen Description ABSCESS HAND LEFT   Final    Special Requests NONE   Final    ACID FAST SMEAR NO ACID FAST BACILLI SEEN   Final    Culture     Final    Value: CULTURE WILL BE EXAMINED FOR 6 WEEKS BEFORE ISSUING A FINAL REPORT   Report Status PENDING   Incomplete   CULTURE, BLOOD (ROUTINE X 2)     Status: Normal (Preliminary result)   Collection Time   06/28/12 12:40 PM      Component Value Range Status Comment   Specimen Description BLOOD FOREARM RIGHT   Final    Special Requests BOTTLES DRAWN AEROBIC ONLY   Final    Culture  Setup Time 06/28/2012 18:03   Final    Culture     Final    Value:        BLOOD CULTURE RECEIVED NO GROWTH TO  DATE CULTURE WILL BE HELD FOR 5 DAYS BEFORE ISSUING A FINAL NEGATIVE REPORT   Report Status PENDING   Incomplete   URINE CULTURE     Status: Normal   Collection Time   06/28/12  1:10 PM      Component Value Range Status Comment   Specimen Description URINE, CATHETERIZED   Final    Special Requests NONE   Final    Culture  Setup Time 06/28/2012 20:21   Final    Colony Count NO GROWTH   Final    Culture NO GROWTH   Final    Report Status 06/29/2012 FINAL   Final   CULTURE, BLOOD (ROUTINE X 2)     Status: Normal (Preliminary result)   Collection Time   06/28/12  1:20 PM      Component Value Range Status Comment   Specimen Description BLOOD HAND RIGHT   Final    Special Requests BOTTLES DRAWN AEROBIC ONLY 8CC   Final    Culture  Setup Time 06/28/2012 18:24   Final    Culture     Final    Value:        BLOOD CULTURE RECEIVED NO GROWTH TO DATE CULTURE WILL BE HELD FOR 5 DAYS BEFORE ISSUING A FINAL NEGATIVE REPORT   Report Status PENDING   Incomplete   CULTURE, BLOOD (ROUTINE X 2)     Status: Normal  (Preliminary result)   Collection Time   06/29/12 12:15 AM      Component Value Range Status Comment   Specimen Description BLOOD RIGHT HAND   Final    Special Requests BOTTLES DRAWN AEROBIC AND ANAEROBIC 5CC EACH   Final    Culture  Setup Time 06/29/2012 04:32   Final    Culture     Final    Value:        BLOOD CULTURE RECEIVED NO GROWTH TO DATE CULTURE WILL BE HELD FOR 5 DAYS BEFORE ISSUING A FINAL NEGATIVE REPORT   Report Status PENDING   Incomplete   CULTURE, BLOOD (ROUTINE X 2)     Status: Normal (Preliminary result)   Collection Time   06/29/12 12:20 AM      Component Value Range Status Comment   Specimen Description BLOOD LEFT ARM   Final    Special Requests BOTTLES DRAWN AEROBIC AND ANAEROBIC 5CC EACH   Final    Culture  Setup Time 06/29/2012 04:32   Final    Culture     Final    Value:        BLOOD CULTURE RECEIVED NO GROWTH TO DATE CULTURE WILL BE HELD FOR 5 DAYS BEFORE ISSUING A FINAL NEGATIVE REPORT   Report Status PENDING   Incomplete   WOUND CULTURE     Status: Normal (Preliminary result)   Collection Time   06/29/12 11:06 AM      Component Value Range Status Comment   Specimen Description WOUND LEFT HAND   Final    Special Requests DORSAL PART OF LEFT HAND   Final    Gram Stain     Final    Value: FEW WBC PRESENT,BOTH PMN AND MONONUCLEAR     NO SQUAMOUS EPITHELIAL CELLS SEEN     NO ORGANISMS SEEN   Culture NO GROWTH 1 DAY   Final    Report Status PENDING   Incomplete   ANAEROBIC CULTURE     Status: Normal (Preliminary result)   Collection Time   06/29/12 11:06 AM  Component Value Range Status Comment   Specimen Description WOUND LEFT HAND   Final    Special Requests DORSAL PART OF LEFT HAND   Final    Gram Stain PENDING   Incomplete    Culture     Final    Value: NO ANAEROBES ISOLATED; CULTURE IN PROGRESS FOR 5 DAYS   Report Status PENDING   Incomplete    Assessment and Plan:  Drug-induced hypersensitivity reaction: Ms. Clover is a 66 y.o. female PMH of DM  type 2, CAD s/p coronary angioplasty with stent placement on 2004 was admitted to [facility]. [place] 1 week before (06/22/12) with group A Step. Pyogens causing abscess on left hand. I and D done by Dr. Merlyn Lot on the same day on 8/20. Discharged to home with oral bactrim. But appeared petechial rash on bilateral knees and legs (lt>rt) and redness associated with fever 103.2 and readmitted on 8/26 with possible causes of allergic reaction from Bactrim and discontinued. Avelox given and within half an hour edema developed all over the body. Possible reaction from Avelox or previously given Bactrim. Symptoms improved with Benedryl. And restarted vancomycin.   S/p 2nd I and D on left hand 2 days ago. Pt has been afebrile and Tmax 98.3 , Vs stable and WBC 8.0 this morning lab. Pt does not look toxic. Chest x-ray from 8/27 shows increased atelectasis at the left lung base. All symptoms improved.  1. Continue Vancomycin1,000 mg, IV, Q24H and oral clindamycin on d/c 2. F/u blood culture from 8/27  3. F/u wound culture from 8/27  4. F/u CBC and BMP  5. Encouraged to use Incentive spirometer frequently   Junious Silk 07/01/2012, 9:33 AM     INFECTIOUS DISEASE ATTENDING ADDENDUM:     [facility]   Date: 07/01/2012  Patient name: [name] [name]  Medical record number: [id number]  Date of birth: 1946-04-13    This patient has been seen and discussed with the house staff. Please see their note for complete details. I concur with their findings with the following additions/corrections:  Patients rash on back is completely resolved but she continues to have a petechial rash and persistent z(even worsening) thrombocytopenia. It would seem very strange for her to now also have a drug induced thrombocytopenia but if this is not better in the am, I will change her to a different drug, possibly oral clindamycin.   Acey Lav 07/01/2012, 5:09 PM

## 2012-07-01 NOTE — Evaluation (Signed)
Physical Therapy Evaluation Patient Details Name: [name] [name] MRN: [id number] DOB: 06-Dec-1945 Today's Date: 07/01/2012 Time: 4098-1191 PT Time Calculation (min): 23 min  PT Assessment / Plan / Recommendation Clinical Impression  pt rpesents with L hand wound s/p I+Ds.  pt very motivated to improve mobility and return to PLOF.      PT Assessment  Patient needs continued PT services    Follow Up Recommendations  Home health PT;Supervision - Intermittent    Barriers to Discharge None      Equipment Recommendations  Rolling walker with 5" wheels;3 in 1 bedside comode (Platform for RW)    Recommendations for Other Services     Frequency Min 3X/week    Precautions / Restrictions Precautions Precautions: Fall Restrictions Weight Bearing Restrictions: Yes LUE Weight Bearing: Non weight bearing Other Position/Activity Restrictions: No WBing orders written, however pt with L hand wound on dorsum of hand.     Pertinent Vitals/Pain Indicates dressing feels very tight on L hand.  Loosened ACE wrap.        Mobility  Bed Mobility Bed Mobility: Not assessed Transfers Transfers: Sit to Stand;Stand to Sit Sit to Stand: 4: Min assist;With upper extremity assist;From chair/3-in-1 Stand to Sit: 4: Min assist;With upper extremity assist (W/C with Nsg to transport to new room.  ) Details for Transfer Assistance: pt only uses R UE to A with transfers.   Ambulation/Gait Ambulation/Gait Assistance: 4: Min assist Ambulation Distance (Feet): 140 Feet Assistive device: 1 person hand held assist Ambulation/Gait Assistance Details: pt moves slowly and cautiously.  pt mildly unsteady and fatigues quickly.   Gait Pattern: Step-through pattern;Decreased stride length;Shuffle Stairs: No Wheelchair Mobility Wheelchair Mobility: No    Exercises     PT Diagnosis: Difficulty walking;Acute pain  PT Problem List: Decreased activity tolerance;Decreased balance;Decreased mobility;Decreased knowledge  of use of DME;Pain PT Treatment Interventions: DME instruction;Gait training;Stair training;Functional mobility training;Therapeutic activities;Therapeutic exercise;Balance training;Patient/family education   PT Goals Acute Rehab PT Goals PT Goal Formulation: With patient Time For Goal Achievement: 07/15/12 Potential to Achieve Goals: Good Pt will go Supine/Side to Sit: with modified independence PT Goal: Supine/Side to Sit - Progress: Goal set today Pt will go Sit to Supine/Side: with modified independence PT Goal: Sit to Supine/Side - Progress: Goal set today Pt will go Sit to Stand: with modified independence PT Goal: Sit to Stand - Progress: Goal set today Pt will go Stand to Sit: with modified independence PT Goal: Stand to Sit - Progress: Goal set today Pt will Ambulate: >150 feet;with modified independence;with least restrictive assistive device PT Goal: Ambulate - Progress: Goal set today Pt will Go Up / Down Stairs: 3-5 stairs;with min assist;with least restrictive assistive device PT Goal: Up/Down Stairs - Progress: Goal set today  Visit Information  Last PT Received On: 07/01/12 Assistance Needed: +1 PT/OT Co-Evaluation/Treatment: Yes    Subjective Data  Subjective: This splint is so tight.   Patient Stated Goal: Back to normal.     Prior Functioning  Home Living Lives With: Spouse Available Help at Discharge: Family (daughter lives next door - in RN school) Type of Home: House Home Access: Stairs to enter Secretary/administrator of Steps: 5 Entrance Stairs-Rails: None Home Layout: One level Bathroom Shower/Tub: Forensic scientist: Standard (can reach vanity) Home Adaptive Equipment: Hand-held shower hose Prior Function Level of Independence: Independent Able to Take Stairs?: Yes Driving: Yes Vocation: Retired Musician: No difficulties Dominant Hand: Right    Cognition  Overall Cognitive Status: Appears within  functional limits for tasks assessed/performed Arousal/Alertness: Awake/alert Orientation Level: Appears intact for tasks assessed Behavior During Session: [facility] for tasks performed    Extremity/Trunk Assessment Right Upper Extremity Assessment RUE ROM/Strength/Tone: Within functional levels RUE Sensation: WFL - Light Touch RUE Coordination: WFL - gross/fine motor Left Upper Extremity Assessment LUE ROM/Strength/Tone: Deficits LUE ROM/Strength/Tone Deficits: Lt UE wrapped with splint on hand. Unable to assess digits.  Right Lower Extremity Assessment RLE ROM/Strength/Tone: WFL for tasks assessed RLE Sensation: WFL - Light Touch Left Lower Extremity Assessment LLE ROM/Strength/Tone: WFL for tasks assessed LLE Sensation: WFL - Light Touch Trunk Assessment Trunk Assessment: Normal   Balance Balance Balance Assessed: Yes Dynamic Standing Balance Dynamic Standing - Balance Support: During functional activity Dynamic Standing - Level of Assistance: 4: Min assist Dynamic Standing - Comments: Able to reach outside of BOS.    End of Session PT - End of Session Equipment Utilized During Treatment: Gait belt Activity Tolerance: Patient limited by fatigue Patient left:  (in W/C with Nsg to transport to new room) Nurse Communication: Mobility status  GP     Sunny Schlein, [place] [phone] 07/01/2012, 10:59 AM

## 2012-07-01 NOTE — Progress Notes (Signed)
ANTIBIOTIC CONSULT NOTE - FOLLOW UP  Pharmacy Consult for Vancomycin Indication: left hand cellulitis, s/p I & D 8/27  Allergies  Allergen Reactions  . Ampicillin Hives, Swelling and Rash  . Avelox (Moxifloxacin Hcl In Nacl) Swelling  . Bactrim (Sulfamethoxazole W-Trimethoprim) Hives, Swelling and Rash  . Ibuprofen Other (See Comments)    unknown  . Penicillins Other (See Comments)    unknown  . Codeine Other (See Comments)    unknown  . Latex Other (See Comments)    unknown    Patient Measurements: Height: 5\' 2"  (157.5 cm) Weight: 179 lb 14.3 oz (81.6 kg) IBW/kg (Calculated) : 50.1   Vital Signs: Temp: 97.7 F (36.5 C) (08/29 1010) Temp src: Oral (08/29 1010) BP: 118/70 mmHg (08/29 1010) Pulse Rate: 86  (08/29 1010) Intake/Output from previous day: 08/28 0701 - 08/29 0700 In: 2400 [P.O.:600; I.V.:1800] Out: 1375 [Urine:1375] Intake/Output from this shift: Total I/O In: 200 [IV Piggyback:200] Out: 251 [Urine:250; Stool:1]  Labs:  [facility] 07/01/12 0450 06/30/12 0500 06/29/12 0551  WBC 8.0 7.7 6.6  HGB 9.4* 10.1* 11.0*  PLT 69* 88* 114*  LABCREA -- -- --  CREATININE 0.55 0.76 1.02   Estimated Creatinine Clearance: 68.5 ml/min (by C-G formula based on Cr of 0.55).  Microbiology:   8/26 - urine culture - negative   8/27 - left hand culture - negative   8/27 - blood cultures - no growth to date  Assessment:  Vancomycin begun 8/26 with 1 gram IV q24hrs.  BUN/creatinine 41/1.57 -->23/0.55.  UOP increasing.  Hand noted improved, planning Clinamycin PO at discharge.  Afebrile, WBC 8.0.  Platelet count down to 69K, petechial rash noted looking better.  Goal of Therapy:  Vancomycin trough level 10-15 mcg/ml  Plan:    Increase Vancomycin 1 gram IV from q24 hrs to q12 hrs.   Will follow renal function and clinical status.   Defer vancomycin troughs for now.  Marya Landry Pager: [phone] 07/01/2012,1:09 PM

## 2012-07-01 NOTE — Progress Notes (Signed)
07/01/2012 9:43 AM Judith Part  [phone]  Report called to Ander Slade, RN on 2000. VSS. No complaints of pain. Transferred with belongings to 2039. Left message with family member of new room.  Celesta Gentile 8/29/20139:43 AM

## 2012-07-01 NOTE — Progress Notes (Signed)
Hydrotherapy Note  Past Medical History  Diagnosis Date  . Coronary artery disease     s/p stent to LAD and LCx  . Hyperlipidemia   . Hypertension   . Diabetes mellitus   . Myocardial infarction     7 yrs ago   Past Surgical History  Procedure Date  . Coronary angioplasty with stent placement 04/26/2003    NORMAL. EF 65%  . Cesarean section   . I&d extremity 06/22/2012    Procedure: IRRIGATION AND DEBRIDEMENT EXTREMITY;  Surgeon: Tami Ribas, MD;  Location: [facility] OR;  Service: Orthopedics;  Laterality: Left;  . I&d extremity 06/29/2012    Procedure: IRRIGATION AND DEBRIDEMENT EXTREMITY;  Surgeon: Tami Ribas, MD;  Location: [facility] OR;  Service: Orthopedics;  Laterality: Left;    07/01/12 1300  Subjective Assessment  Subjective It looks like chopped liver.    Patient and Family Stated Goals Wound Healing  Date of Onset 06/22/12  Prior Treatments I+D and one Outpatient WP.  Now post second I+D.    Evaluation and Treatment  Evaluation and Treatment Procedures Explained to Patient/Family Yes  Evaluation and Treatment Procedures agreed to  Wound 06/29/12 Other (Comment) Hand Left;Other (Comment) Dorsum of L hand open I+D  Date First Assessed/Time First Assessed: 06/29/12 0900   Wound Type: Other (Comment)  Location: Hand  Location Orientation: Left;Other (Comment)  Wound Description (Comments): Dorsum of L hand open I+D  Present on Admission: Yes  Site / Wound Assessment Bleeding;Granulation tissue;Painful;Pink;Red  % Wound base Red or Granulating 50%  % Wound base Yellow 20%  % Wound base Other (Comment) 30% (Tendon and blood vessels)  Peri-wound Assessment Edema;Erythema (non-blanchable);Purple  Wound Length (cm) 10.6 cm  Wound Width (cm) 2.9 cm  Wound Depth (cm) 0.7 cm  Undermining (cm) 12:00- 6:00 2.5cm and 6:00- 11:00 4.1cm.    Margins Unattacted edges (unapproximated)  Closure None  Drainage Amount Copious  Drainage Description Serosanguineous;No odor  Treatment Hydrotherapy  (Pulse lavage)  Dressing Type ABD;Gauze (Comment) (1/4" Iodoform in saline, Xeroform, 4x4s, ABD, kerlix, ace)  Dressing Changed Changed  Dressing Status Clean;Dry;Intact  Wound 07/01/12 Other (Comment) Hand Left;Other (Comment) Dorsum L Ring Finger open I+D  Date First Assessed/Time First Assessed: 07/01/12 1145   Wound Type: Other (Comment)  Location: Hand  Location Orientation: Left;Other (Comment)  Wound Description (Comments): Dorsum L Ring Finger open I+D  Present on Admission: No  Site / Wound Assessment Bleeding;Granulation tissue;Painful;Pink;Red;Yellow  % Wound base Red or Granulating 70%  % Wound base Yellow 10%  % Wound base Other (Comment) 20% (Tendon)  Peri-wound Assessment Edema;Erythema (non-blanchable);Purple  Wound Length (cm) 2.4 cm  Wound Width (cm) 0.5 cm  Wound Depth (cm) 0.8 cm  Undermining (cm) 12:00- 6:00 0.8cm, at 6:00 connects to wound #1, 6:00- 12:00 0.9cm.    Margins Unattacted edges (unapproximated)  Closure None  Drainage Amount Moderate  Drainage Description Serosanguineous  Treatment Hydrotherapy (Pulse lavage)  Dressing Type ABD;Gauze (Comment) (1/4" Iodoform in saline, Xeroform, 4x4s, ABD, Kerlix, ace)  Dressing Changed Changed  Dressing Status Clean;Dry;Intact  Hydrotherapy  Pulsed Lavage with Suction (psi) 4 psi  Pulsed Lavage with Suction - Normal Saline Used 1000 mL  Pulsed Lavage Tip Tip with splash shield  Pulsed lavage therapy - wound location Dorsum L hand and ring finger  Wound Therapy - Assess/Plan/Recommendations  Wound Therapy - Clinical Statement pt presents s/p 2nd I+D of Dorsum of L hand now with second I+D incision on Dorsum of L ring finger.  pt  much more painful today and needed to stop treatment to get second dose of IV meds inorder to continue and was still very painful.  pt's wounds with much less slough than previous treatment.  Will f/u with [facility] tomorrow if pt D/Cs tomorrow, otherwise will see pt Saturday per MD order.     Wound Therapy - Functional Problem List Open infected wound on dorsum of L hand.    Factors Delaying/Impairing Wound Healing Infection - systemic/local;Polypharmacy  Hydrotherapy Plan Debridement;Dressing change;Patient/family education;Pulsatile lavage with suction;Whirlpool  Wound Therapy - Frequency 3X / week (Every other day per MD order)  Wound Therapy - Follow Up Recommendations [facility]  Wound Plan pt will be seen for Hydrotherapy to Dorsum of L hand for wound cleansing and debridement to promote wound healing.    Wound Therapy Goals - Improve the function of patient's integumentary system by progressing the wound(s) through the phases of wound healing by:  Decrease Necrotic Tissue to 0%  Decrease Necrotic Tissue - Progress Goal set today  Increase Granulation Tissue to 70%  Increase Granulation Tissue - Progress Goal set today  Improve Drainage Characteristics Min  Improve Drainage Characteristics - Progress Goal set today  Patient/Family will be able to  pt and family will be able to demonstrate UE positioning for edema control and dressing changes.    Patient/Family Instruction Goal - Progress Goal set today  Goals/treatment plan/discharge plan were made with and agreed upon by patient/family Yes  Time For Goal Achievement 7 days  Wound Therapy - Potential for Goals Good    Kaysie Michelini, PT [phone]

## 2012-07-01 NOTE — Progress Notes (Signed)
Subjective: 2 Days Post-Op Procedure(s) (LRB): IRRIGATION AND DEBRIDEMENT EXTREMITY (Left) Patient reports pain as feeling better.  ate breakfast and walked in halls.  feels tired now.  .    Objective: Vital signs in last 24 hours: Temp:  [97.6 F (36.4 C)-98.3 F (36.8 C)] 97.7 F (36.5 C) (08/29 1010) Pulse Rate:  [80-86] 86  (08/29 1010) Resp:  [15-20] 18  (08/29 1010) BP: (99-118)/(48-70) 118/70 mmHg (08/29 1010) SpO2:  [92 %-97 %] 93 % (08/29 1010) Weight:  [81.6 kg (179 lb 14.3 oz)] 81.6 kg (179 lb 14.3 oz) (08/29 0312)  Intake/Output from previous day: 08/28 0701 - 08/29 0700 In: 2400 [P.O.:600; I.V.:1800] Out: 1375 [Urine:1375] Intake/Output this shift: Total I/O In: 200 [IV Piggyback:200] Out: 251 [Urine:250; Stool:1]   Basename 07/01/12 0450 06/30/12 0500 06/29/12 0551  HGB 9.4* 10.1* 11.0*    Basename 07/01/12 0450 06/30/12 0500  WBC 8.0 7.7  RBC 2.94* 3.20*  HCT 27.2* 29.4*  PLT 69* 88*    Basename 07/01/12 0450 06/30/12 0500  NA 134* 132*  K 4.0 4.3  CL 106 104  CO2 20 21  BUN 23 34*  CREATININE 0.55 0.76  GLUCOSE 85 147*  CALCIUM 7.4* 7.1*    Basename 06/29/12 0551  LABPT --  INR 1.41    intact sensation and capillary refill.  wiggles fingers.  wound looks good.  erythema on dorsum of hand resolved.  no proximal streaking.  rash on arms and legs dissipating.  Assessment/Plan: 2 Days Post-Op Procedure(s) (LRB): IRRIGATION AND DEBRIDEMENT EXTREMITY (Left) Hydrotherapy today.  Will plan for every other day.  Improving.  Umar Patmon R 07/01/2012, 12:06 PM

## 2012-07-01 NOTE — Evaluation (Signed)
Occupational Therapy Evaluation Patient Details Name: [name] [name] MRN: [id number] DOB: [birth year] Today's Date: 07/01/2012 Time: 4098-1191 OT Time Calculation (min): 22 min  OT Assessment / Plan / Recommendation Clinical Impression  66 yo female admitted with Lt hand I& D that could benefit from skilled OT. Ot to follow acutely    OT Assessment  Patient needs continued OT Services    Follow Up Recommendations  Home health OT    Barriers to Discharge      Equipment Recommendations  Rolling walker with 5" wheels;3 in 1 bedside comode (with plateform walker)    Recommendations for Other Services    Frequency  Min 2X/week    Precautions / Restrictions Precautions Precautions: Fall Restrictions Weight Bearing Restrictions: Yes LUE Weight Bearing: Non weight bearing   Pertinent Vitals/Pain stable    ADL  Eating/Feeding: Performed;Set up Where Assessed - Eating/Feeding: Other (comment) (standing at sink) Grooming: Performed;Teeth care;Min guard Where Assessed - Grooming: Unsupported standing Toilet Transfer: Simulated;Minimal assistance (NWB LT UE) Toilet Transfer Method: Sit to stand Toilet Transfer Equipment: Raised toilet seat with arms (or 3-in-1 over toilet) Equipment Used: Gait belt;Rolling walker Transfers/Ambulation Related to ADLs: Pt ambulating with Rt UE on IV pole for stability. Pt with decreased gait speed and activity tolerance ADL Comments: Pt finishing bath with tech on arrival. Pt reports ability to wash face and having (A) with all other aspects. Pt ambulated in hall and then to the bathroom room for oral care. Pt required (A) with opening tooth paste due to LT hand wrapped with splint. Pt completed all oral care Rt handed without deficits    OT Diagnosis: Generalized weakness;Acute pain  OT Problem List: Decreased strength;Decreased activity tolerance;Impaired balance (sitting and/or standing);Decreased cognition;Decreased safety awareness;Decreased  knowledge of use of DME or AE;Decreased knowledge of precautions;Pain OT Treatment Interventions: Self-care/ADL training;DME and/or AE instruction;Therapeutic activities;Patient/family education;Balance training   OT Goals Acute Rehab OT Goals OT Goal Formulation: With patient Time For Goal Achievement: 07/15/12 Potential to Achieve Goals: Good ADL Goals Pt Will Perform Grooming: Standing at sink;with modified independence ADL Goal: Grooming - Progress: Goal set today Pt Will Perform Upper Body Bathing: with set-up;Sit to stand from chair ADL Goal: Upper Body Bathing - Progress: Goal set today Pt Will Perform Lower Body Bathing: with set-up;Sit to stand from chair ADL Goal: Lower Body Bathing - Progress: Goal set today Pt Will Perform Upper Body Dressing: with modified independence;Sit to stand from chair ADL Goal: Upper Body Dressing - Progress: Goal set today Pt Will Perform Lower Body Dressing: with modified independence;Sit to stand from chair ADL Goal: Lower Body Dressing - Progress: Goal set today Pt Will Transfer to Toilet: with modified independence;Ambulation;3-in-1 ADL Goal: Toilet Transfer - Progress: Goal set today Miscellaneous OT Goals Miscellaneous OT Goal #1: Pt will complete bed mobility mod i as precursor to d/c home OT Goal: Miscellaneous Goal #1 - Progress: Goal set today  Visit Information  Last OT Received On: 07/01/12 Assistance Needed: +1 PT/OT Co-Evaluation/Treatment: Yes    Subjective Data  Subjective: "it may have the start of Parkinson's' - describing husband and need for (A) from pt Patient Stated Goal: Pt wishes to return home   Prior Functioning  Vision/Perception  Home Living Lives With: Spouse Available Help at Discharge: Family (daughter lives next door - in [employer]) Type of Home: House Home Access: Stairs to enter Secretary/administrator of Steps: 5 Entrance Stairs-Rails: None Home Layout: One level Bathroom Shower/Tub: Tub/shower  unit;Curtain Bathroom Toilet: Standard (can reach  vanity) Home Adaptive Equipment: Hand-held shower hose Prior Function Level of Independence: Independent Able to Take Stairs?: Yes Driving: Yes Vocation: Retired Musician: No difficulties Dominant Hand: Right      Cognition  Overall Cognitive Status: Appears within functional limits for tasks assessed/performed Arousal/Alertness: Awake/alert Orientation Level: Appears intact for tasks assessed Behavior During Session: [facility] for tasks performed    Extremity/Trunk Assessment Right Upper Extremity Assessment RUE ROM/Strength/Tone: Within functional levels RUE Sensation: WFL - Light Touch RUE Coordination: WFL - gross/fine motor Left Upper Extremity Assessment LUE ROM/Strength/Tone: Deficits LUE ROM/Strength/Tone Deficits: Lt UE wrapped with splint on hand. Unable to assess digits.  Trunk Assessment Trunk Assessment: Normal   Mobility  Shoulder Instructions  Transfers Transfers: Sit to Stand;Stand to Sit Sit to Stand: 4: Min assist;With upper extremity assist;From chair/3-in-1 Stand to Sit: 4: Min assist;With upper extremity assist;To chair/3-in-1       Exercise     Balance Balance Balance Assessed: Yes Dynamic Standing Balance Dynamic Standing - Balance Support: During functional activity Dynamic Standing - Level of Assistance: 4: Min assist;Other (comment) (asked to reach outside base of support )   End of Session OT - End of Session Activity Tolerance: Patient tolerated treatment well Patient left: in chair;with call bell/phone within reach;Other (comment) (Rn transfering pt in w/c) Nurse Communication: Mobility status;Patient requests pain meds  GO     Harrel Carina [facility] 07/01/2012, 10:45 AM Pager: [phone]

## 2012-07-01 NOTE — Progress Notes (Signed)
Subjective: F/Up Hand infection/Bactrim Reaction/?Toxic Shock vrs Sepsis. Post op day 2 Looks better this am Some L hand pain Mild cough CBGs better Foley out.  Increasing UOP. No more Fevers Swallowing better   Objective: Vital signs in last 24 hours: Temp:  [97.6 F (36.4 C)-98.2 F (36.8 C)] 97.6 F (36.4 C) (08/29 0312) Pulse Rate:  [80-84] 81  (08/29 0310) Resp:  [15-20] 18  (08/29 0310) BP: (104-117)/(48-62) 117/52 mmHg (08/29 0310) SpO2:  [92 %-97 %] 96 % (08/29 0310) Weight:  [81.6 kg (179 lb 14.3 oz)] 81.6 kg (179 lb 14.3 oz) (08/29 0312) Weight change: 1.6 kg (3 lb 8.4 oz) Last BM Date: 06/30/12  CBG (last 3)   Basename 06/30/12 2139 06/30/12 1750 06/30/12 1201  GLUCAP 105* 118* 192*    Intake/Output from previous day:  Intake/Output Summary (Last 24 hours) at 07/01/12 0708 Last data filed at 07/01/12 0600  Gross per 24 hour  Intake   2325 ml  Output   1375 ml  Net    950 ml   08/28 0701 - 08/29 0700 In: 2325 [P.O.:600; I.V.:1725] Out: 1375 [Urine:1375]   Physical Exam  General appearance: doing better Eyes: no scleral icterus Throat: dry Resp: CTA. Maybe minor rales. Cardio: Reg GI: soft, non-tender; bowel sounds normal; no masses,  no organomegaly Extremities: less full body edema. But more foocal LE Edema. Petechiael rash much better.  Now diffuse pink rash without hives or pruritis Hand wrapped and elevated  Lab Results:  Basename 07/01/12 0450 06/30/12 0500  NA 134* 132*  K 4.0 4.3  CL 106 104  CO2 20 21  GLUCOSE 85 147*  BUN 23 34*  CREATININE 0.55 0.76  CALCIUM 7.4* 7.1*  MG -- --  PHOS -- --     Basename 07/01/12 0450 06/30/12 0500  AST 59* 28  ALT 39* 25  ALKPHOS 98 69  BILITOT 0.3 0.2*  PROT 3.8* 3.9*  ALBUMIN 1.6* 1.6*     Basename 07/01/12 0450 06/30/12 0500 06/28/12 1152  WBC 8.0 7.7 --  NEUTROABS -- -- 5.6  HGB 9.4* 10.1* --  HCT 27.2* 29.4* --  MCV 92.5 91.9 --  PLT 69* 88* --    Lab Results    Component Value Date   INR 1.41 06/29/2012    No results found for this basename: CKTOTAL:3,CKMB:3,CKMBINDEX:3,TROPONINI:3 in the last 72 hours  No results found for this basename: TSH,T4TOTAL,FREET3,T3FREE,THYROIDAB in the last 72 hours  No results found for this basename: VITAMINB12:2,FOLATE:2,FERRITIN:2,TIBC:2,IRON:2,RETICCTPCT:2 in the last 72 hours  Micro Results: Recent Results (from the past 240 hour(s))  SURGICAL PCR SCREEN     Status: Abnormal   Collection Time   06/22/12  7:14 PM      Component Value Range Status Comment   MRSA, PCR NEGATIVE  NEGATIVE Final    Staphylococcus aureus POSITIVE (*) NEGATIVE Final   ANAEROBIC CULTURE     Status: Normal   Collection Time   06/22/12  8:57 PM      Component Value Range Status Comment   Specimen Description ABSCESS HAND LEFT   Final    Special Requests NONE   Final    Gram Stain     Final    Value: RARE WBC PRESENT, PREDOMINANTLY PMN     RARE SQUAMOUS EPITHELIAL CELLS PRESENT     FEW GRAM POSITIVE COCCI     IN PAIRS   Culture NO ANAEROBES ISOLATED   Final    Report Status 06/27/2012 FINAL  Final   CULTURE, ROUTINE-ABSCESS     Status: Normal   Collection Time   06/22/12  8:57 PM      Component Value Range Status Comment   Specimen Description ABSCESS HAND LEFT   Final    Special Requests NONE   Final    Gram Stain     Final    Value: RARE WBC PRESENT, PREDOMINANTLY PMN     RARE SQUAMOUS EPITHELIAL CELLS PRESENT     FEW GRAM POSITIVE COCCI     IN PAIRS   Culture MODERATE GROUP A STREP (S.PYOGENES) ISOLATED   Final    Report Status 06/25/2012 FINAL   Final   FUNGUS CULTURE W SMEAR     Status: Normal (Preliminary result)   Collection Time   06/22/12  8:57 PM      Component Value Range Status Comment   Specimen Description ABSCESS HAND LEFT   Final    Special Requests NONE   Final    Fungal Smear NO YEAST OR FUNGAL ELEMENTS SEEN   Final    Culture CULTURE IN PROGRESS FOR FOUR WEEKS   Final    Report Status PENDING    Incomplete   AFB CULTURE WITH SMEAR     Status: Normal (Preliminary result)   Collection Time   06/22/12  8:57 PM      Component Value Range Status Comment   Specimen Description ABSCESS HAND LEFT   Final    Special Requests NONE   Final    ACID FAST SMEAR NO ACID FAST BACILLI SEEN   Final    Culture     Final    Value: CULTURE WILL BE EXAMINED FOR 6 WEEKS BEFORE ISSUING A FINAL REPORT   Report Status PENDING   Incomplete   CULTURE, BLOOD (ROUTINE X 2)     Status: Normal (Preliminary result)   Collection Time   06/28/12 12:40 PM      Component Value Range Status Comment   Specimen Description BLOOD FOREARM RIGHT   Final    Special Requests BOTTLES DRAWN AEROBIC ONLY   Final    Culture  Setup Time 06/28/2012 18:03   Final    Culture     Final    Value:        BLOOD CULTURE RECEIVED NO GROWTH TO DATE CULTURE WILL BE HELD FOR 5 DAYS BEFORE ISSUING A FINAL NEGATIVE REPORT   Report Status PENDING   Incomplete   URINE CULTURE     Status: Normal   Collection Time   06/28/12  1:10 PM      Component Value Range Status Comment   Specimen Description URINE, CATHETERIZED   Final    Special Requests NONE   Final    Culture  Setup Time 06/28/2012 20:21   Final    Colony Count NO GROWTH   Final    Culture NO GROWTH   Final    Report Status 06/29/2012 FINAL   Final   CULTURE, BLOOD (ROUTINE X 2)     Status: Normal (Preliminary result)   Collection Time   06/28/12  1:20 PM      Component Value Range Status Comment   Specimen Description BLOOD HAND RIGHT   Final    Special Requests BOTTLES DRAWN AEROBIC ONLY 8CC   Final    Culture  Setup Time 06/28/2012 18:24   Final    Culture     Final    Value:  BLOOD CULTURE RECEIVED NO GROWTH TO DATE CULTURE WILL BE HELD FOR 5 DAYS BEFORE ISSUING A FINAL NEGATIVE REPORT   Report Status PENDING   Incomplete   CULTURE, BLOOD (ROUTINE X 2)     Status: Normal (Preliminary result)   Collection Time   06/29/12 12:15 AM      Component Value Range Status  Comment   Specimen Description BLOOD RIGHT HAND   Final    Special Requests BOTTLES DRAWN AEROBIC AND ANAEROBIC 5CC EACH   Final    Culture  Setup Time 06/29/2012 04:32   Final    Culture     Final    Value:        BLOOD CULTURE RECEIVED NO GROWTH TO DATE CULTURE WILL BE HELD FOR 5 DAYS BEFORE ISSUING A FINAL NEGATIVE REPORT   Report Status PENDING   Incomplete   CULTURE, BLOOD (ROUTINE X 2)     Status: Normal (Preliminary result)   Collection Time   06/29/12 12:20 AM      Component Value Range Status Comment   Specimen Description BLOOD LEFT ARM   Final    Special Requests BOTTLES DRAWN AEROBIC AND ANAEROBIC 5CC EACH   Final    Culture  Setup Time 06/29/2012 04:32   Final    Culture     Final    Value:        BLOOD CULTURE RECEIVED NO GROWTH TO DATE CULTURE WILL BE HELD FOR 5 DAYS BEFORE ISSUING A FINAL NEGATIVE REPORT   Report Status PENDING   Incomplete   WOUND CULTURE     Status: Normal (Preliminary result)   Collection Time   06/29/12 11:06 AM      Component Value Range Status Comment   Specimen Description WOUND LEFT HAND   Final    Special Requests DORSAL PART OF LEFT HAND   Final    Gram Stain     Final    Value: FEW WBC PRESENT,BOTH PMN AND MONONUCLEAR     NO SQUAMOUS EPITHELIAL CELLS SEEN     NO ORGANISMS SEEN   Culture NO GROWTH 1 DAY   Final    Report Status PENDING   Incomplete   ANAEROBIC CULTURE     Status: Normal (Preliminary result)   Collection Time   06/29/12 11:06 AM      Component Value Range Status Comment   Specimen Description WOUND LEFT HAND   Final    Special Requests DORSAL PART OF LEFT HAND   Final    Gram Stain PENDING   Incomplete    Culture     Final    Value: NO ANAEROBES ISOLATED; CULTURE IN PROGRESS FOR 5 DAYS   Report Status PENDING   Incomplete      Studies/Results: Dg Chest Port 1 View  06/29/2012  *RADIOLOGY REPORT*  Clinical Data: Fever and swelling.  PORTABLE CHEST - 1 VIEW  Comparison: 06/28/2012  Findings: There is some increased  density at the left lung base likely representing atelectasis.  Probable underlying chronic lung disease is stable.  Heart size is normal.  No edema or pleural fluid.  IMPRESSION: Increased atelectasis at the left lung base.   Original Report Authenticated By: Reola Calkins, M.D.      Medications: Scheduled:    . antiseptic oral rinse  15 mL Mouth Rinse BID  . insulin aspart  0-5 Units Subcutaneous QHS  . insulin aspart  0-9 Units Subcutaneous TID WC  . insulin NPH  10 Units  Subcutaneous BID  . vancomycin  1,000 mg Intravenous Q24H   Continuous:    . sodium chloride 75 mL/hr at 07/01/12 0216     Assessment/Plan: Principal Problem:  *Drug-induced hypersensitivity reaction Active Problems:  CAD (coronary artery disease)  Fever  Lactic acidosis  Hand abscess  Hyponatremia  Hypertension  DM2 (diabetes mellitus, type 2)  Petechiae  She appears to finally be getting better.  L Hand infection/Bactrim Reaction/(MODERATE GROUP A STREP (S.PYOGENES) ISOLATED)/?Toxic Shock vrs Sepsis - POD #2 - finally looking better.  Wound care per Ortho. Continue Abx/Vanco.  Agree with ID.  Oral Clinda?  Petechiae Rash/redness/SQ edema/fever - Presumed Bactrim rash/Drug reaction- Bactrim D/ced. Supportive care. Doing better.   HTN - BP stable and S/P IVF. Hold antihypertensives. Hep lock IV.  CAD - no Angina   Lipids - on meds - can restart Crestor.   DeHydration and lactic Acidosis - Better.  No Obvious DKA.  Presumed Hypoperfusion of gut with third spacing and insensible losses.  Dysphagia - Swallowing better  Thrombocytopenia  - following - should improve now that petechial rash is better.  Holding anticoagulation for fear of Bleeding.  Skin looks a little better.  One Squeezer for DVT proph. Afraid to put on L Leg with her foot issue.   Confusion - CCT (-). Metabolic Encephalopathy from illness and Lactic acidosis. She is returning to baseline.  O2 for low sats prn.  Edema and  may need Diuretics.  Will let her autodiurese if she will.  Mild Renal Insuff - Hydration helped. Cr is better.  Mild Hyponatremia - better  DM2 - Hold Metformin as her Lactic acid is high and use NPH/ISS.  CBGs improving.  Basename 06/30/12 2139 06/30/12 1750 06/30/12 1201  GLUCAP 105* 118* 192*   Now new skin rash??? - Zyrtec...  PT/OT/CW.Marland KitchenMarland KitchenTransfer to the floor/tele today  ID -  Anti-infectives     Start     Dose/Rate Route Frequency Ordered Stop   06/29/12 0730   vancomycin (VANCOCIN) IVPB 1000 mg/200 mL premix        1,000 mg 200 mL/hr over 60 Minutes Intravenous Every 24 hours 06/29/12 0703     06/29/12 0000   moxifloxacin (AVELOX) IVPB 400 mg  Status:  Discontinued        400 mg 250 mL/hr over 60 Minutes Intravenous Every 24 hours 06/28/12 2344 06/29/12 0726   06/28/12 1430   vancomycin (VANCOCIN) IVPB 1000 mg/200 mL premix        1,000 mg 200 mL/hr over 60 Minutes Intravenous  Once 06/28/12 1422 06/28/12 1548          LOS: 3 days   Sarita Hakanson M 07/01/2012, 7:08 AM

## 2012-07-02 DIAGNOSIS — L0291 Cutaneous abscess, unspecified: Secondary | ICD-10-CM

## 2012-07-02 DIAGNOSIS — L0390 Cellulitis, unspecified: Secondary | ICD-10-CM

## 2012-07-02 LAB — GLUCOSE, CAPILLARY
Glucose-Capillary: 129 mg/dL — ABNORMAL HIGH (ref 70–99)
Glucose-Capillary: 146 mg/dL — ABNORMAL HIGH (ref 70–99)
Glucose-Capillary: 175 mg/dL — ABNORMAL HIGH (ref 70–99)

## 2012-07-02 LAB — COMPREHENSIVE METABOLIC PANEL
AST: 95 U/L — ABNORMAL HIGH (ref 0–37)
CO2: 23 mEq/L (ref 19–32)
Calcium: 8.1 mg/dL — ABNORMAL LOW (ref 8.4–10.5)
Creatinine, Ser: 0.49 mg/dL — ABNORMAL LOW (ref 0.50–1.10)
GFR calc Af Amer: >90 mL/min (ref >90)
GFR calc non Af Amer: >90 mL/min (ref >90)
Glucose, Bld: 124 mg/dL — ABNORMAL HIGH (ref 70–99)
Total Protein: 4.5 g/dL — ABNORMAL LOW (ref 6.0–8.3)

## 2012-07-02 LAB — CBC
HCT: 28 % — ABNORMAL LOW (ref 36.0–46.0)
Hemoglobin: 9.6 g/dL — ABNORMAL LOW (ref 12.0–15.0)
RBC: 3.03 MIL/uL — ABNORMAL LOW (ref 3.87–5.11)

## 2012-07-02 MED ORDER — SPIRONOLACTONE 25 MG PO TABS
25.0000 mg | ORAL_TABLET | Freq: Once | ORAL | Status: AC
  Administered 2012-07-02: 25 mg via ORAL
  Filled 2012-07-02: qty 1

## 2012-07-02 NOTE — Progress Notes (Signed)
Subjective: F/Up Hand infection/Bactrim Reaction/?Toxic Shock vrs Sepsis. Post op day 3 Looks better this am Some L hand pain No cough CBGs better + Edema No more Fevers Swallowing better Just weak   Objective: Vital signs in last 24 hours: Temp:  [97.7 F (36.5 C)-98.3 F (36.8 C)] 97.7 F (36.5 C) (08/30 0502) Pulse Rate:  [81-86] 82  (08/30 0502) Resp:  [16-20] 18  (08/30 0502) BP: (99-118)/(51-70) 112/69 mmHg (08/30 0502) SpO2:  [93 %-97 %] 94 % (08/30 0502) Weight:  [79.516 kg (175 lb 4.8 oz)] 79.516 kg (175 lb 4.8 oz) (08/30 0500) Weight change: -2.085 kg (-4 lb 9.5 oz) Last BM Date: 07/01/12  CBG (last 3)   Basename 07/02/12 0619 07/01/12 2116 07/01/12 1624  GLUCAP 129* 169* 111*    Intake/Output from previous day:  Intake/Output Summary (Last 24 hours) at 07/02/12 0724 Last data filed at 07/02/12 0335  Gross per 24 hour  Intake    200 ml  Output    851 ml  Net   -651 ml   08/29 0701 - 08/30 0700 In: 200 [IV Piggyback:200] Out: 851 [Urine:850; Stool:1]   Physical Exam  General appearance: doing better Resp: CTA. Maybe minor rales. Cardio: Reg GI: soft, non-tender; bowel sounds normal; no masses,  no organomegaly Extremities: less full body edema. But more foocal LE Edema. Petechiael rash much better.  confluent pink rash without hives or pruritis - min desquamation under upper body leads Hand wrapped and elevated  Lab Results:  Basename 07/02/12 0500 07/01/12 0450  NA 137 134*  K 3.9 4.0  CL 105 106  CO2 23 20  GLUCOSE 124* 85  BUN 12 23  CREATININE 0.49* 0.55  CALCIUM 8.1* 7.4*  MG -- --  PHOS -- --     Basename 07/02/12 0500 07/01/12 0450  AST 95* 59*  ALT 61* 39*  ALKPHOS 168* 98  BILITOT 0.5 0.3  PROT 4.5* 3.8*  ALBUMIN 1.9* 1.6*     Basename 07/02/12 0500 07/01/12 0450  WBC 10.4 8.0  NEUTROABS -- --  HGB 9.6* 9.4*  HCT 28.0* 27.2*  MCV 92.4 92.5  PLT 104* 69*    Lab Results  Component Value Date   INR 1.41  06/29/2012    No results found for this basename: CKTOTAL:3,CKMB:3,CKMBINDEX:3,TROPONINI:3 in the last 72 hours  No results found for this basename: TSH,T4TOTAL,FREET3,T3FREE,THYROIDAB in the last 72 hours  No results found for this basename: VITAMINB12:2,FOLATE:2,FERRITIN:2,TIBC:2,IRON:2,RETICCTPCT:2 in the last 72 hours  Micro Results: Recent Results (from the past 240 hour(s))  SURGICAL PCR SCREEN     Status: Abnormal   Collection Time   06/22/12  7:14 PM      Component Value Range Status Comment   MRSA, PCR NEGATIVE  NEGATIVE Final    Staphylococcus aureus POSITIVE (*) NEGATIVE Final   ANAEROBIC CULTURE     Status: Normal   Collection Time   06/22/12  8:57 PM      Component Value Range Status Comment   Specimen Description ABSCESS HAND LEFT   Final    Special Requests NONE   Final    Gram Stain     Final    Value: RARE WBC PRESENT, PREDOMINANTLY PMN     RARE SQUAMOUS EPITHELIAL CELLS PRESENT     FEW GRAM POSITIVE COCCI     IN PAIRS   Culture NO ANAEROBES ISOLATED   Final    Report Status 06/27/2012 FINAL   Final   CULTURE, ROUTINE-ABSCESS  Status: Normal   Collection Time   06/22/12  8:57 PM      Component Value Range Status Comment   Specimen Description ABSCESS HAND LEFT   Final    Special Requests NONE   Final    Gram Stain     Final    Value: RARE WBC PRESENT, PREDOMINANTLY PMN     RARE SQUAMOUS EPITHELIAL CELLS PRESENT     FEW GRAM POSITIVE COCCI     IN PAIRS   Culture MODERATE GROUP A STREP (S.PYOGENES) ISOLATED   Final    Report Status 06/25/2012 FINAL   Final   FUNGUS CULTURE W SMEAR     Status: Normal (Preliminary result)   Collection Time   06/22/12  8:57 PM      Component Value Range Status Comment   Specimen Description ABSCESS HAND LEFT   Final    Special Requests NONE   Final    Fungal Smear NO YEAST OR FUNGAL ELEMENTS SEEN   Final    Culture CULTURE IN PROGRESS FOR FOUR WEEKS   Final    Report Status PENDING   Incomplete   AFB CULTURE WITH SMEAR      Status: Normal (Preliminary result)   Collection Time   06/22/12  8:57 PM      Component Value Range Status Comment   Specimen Description ABSCESS HAND LEFT   Final    Special Requests NONE   Final    ACID FAST SMEAR NO ACID FAST BACILLI SEEN   Final    Culture     Final    Value: CULTURE WILL BE EXAMINED FOR 6 WEEKS BEFORE ISSUING A FINAL REPORT   Report Status PENDING   Incomplete   CULTURE, BLOOD (ROUTINE X 2)     Status: Normal (Preliminary result)   Collection Time   06/28/12 12:40 PM      Component Value Range Status Comment   Specimen Description BLOOD FOREARM RIGHT   Final    Special Requests BOTTLES DRAWN AEROBIC ONLY   Final    Culture  Setup Time 06/28/2012 18:03   Final    Culture     Final    Value:        BLOOD CULTURE RECEIVED NO GROWTH TO DATE CULTURE WILL BE HELD FOR 5 DAYS BEFORE ISSUING A FINAL NEGATIVE REPORT   Report Status PENDING   Incomplete   URINE CULTURE     Status: Normal   Collection Time   06/28/12  1:10 PM      Component Value Range Status Comment   Specimen Description URINE, CATHETERIZED   Final    Special Requests NONE   Final    Culture  Setup Time 06/28/2012 20:21   Final    Colony Count NO GROWTH   Final    Culture NO GROWTH   Final    Report Status 06/29/2012 FINAL   Final   CULTURE, BLOOD (ROUTINE X 2)     Status: Normal (Preliminary result)   Collection Time   06/28/12  1:20 PM      Component Value Range Status Comment   Specimen Description BLOOD HAND RIGHT   Final    Special Requests BOTTLES DRAWN AEROBIC ONLY 8CC   Final    Culture  Setup Time 06/28/2012 18:24   Final    Culture     Final    Value:        BLOOD CULTURE RECEIVED NO GROWTH TO DATE CULTURE WILL  BE HELD FOR 5 DAYS BEFORE ISSUING A FINAL NEGATIVE REPORT   Report Status PENDING   Incomplete   CULTURE, BLOOD (ROUTINE X 2)     Status: Normal (Preliminary result)   Collection Time   06/29/12 12:15 AM      Component Value Range Status Comment   Specimen Description BLOOD  RIGHT HAND   Final    Special Requests BOTTLES DRAWN AEROBIC AND ANAEROBIC 5CC EACH   Final    Culture  Setup Time 06/29/2012 04:32   Final    Culture     Final    Value:        BLOOD CULTURE RECEIVED NO GROWTH TO DATE CULTURE WILL BE HELD FOR 5 DAYS BEFORE ISSUING A FINAL NEGATIVE REPORT   Report Status PENDING   Incomplete   CULTURE, BLOOD (ROUTINE X 2)     Status: Normal (Preliminary result)   Collection Time   06/29/12 12:20 AM      Component Value Range Status Comment   Specimen Description BLOOD LEFT ARM   Final    Special Requests BOTTLES DRAWN AEROBIC AND ANAEROBIC 5CC EACH   Final    Culture  Setup Time 06/29/2012 04:32   Final    Culture     Final    Value:        BLOOD CULTURE RECEIVED NO GROWTH TO DATE CULTURE WILL BE HELD FOR 5 DAYS BEFORE ISSUING A FINAL NEGATIVE REPORT   Report Status PENDING   Incomplete   WOUND CULTURE     Status: Normal   Collection Time   06/29/12 11:06 AM      Component Value Range Status Comment   Specimen Description WOUND LEFT HAND   Final    Special Requests DORSAL PART OF LEFT HAND   Final    Gram Stain     Final    Value: FEW WBC PRESENT,BOTH PMN AND MONONUCLEAR     NO SQUAMOUS EPITHELIAL CELLS SEEN     NO ORGANISMS SEEN   Culture NO GROWTH 2 DAYS   Final    Report Status 07/01/2012 FINAL   Final   ANAEROBIC CULTURE     Status: Normal (Preliminary result)   Collection Time   06/29/12 11:06 AM      Component Value Range Status Comment   Specimen Description WOUND LEFT HAND   Final    Special Requests DORSAL PART OF LEFT HAND   Final    Gram Stain PENDING   Incomplete    Culture     Final    Value: NO ANAEROBES ISOLATED; CULTURE IN PROGRESS FOR 5 DAYS   Report Status PENDING   Incomplete      Studies/Results: No results found.   Medications: Scheduled:    . antiseptic oral rinse  15 mL Mouth Rinse BID  . atorvastatin  20 mg Oral q1800  .  HYDROmorphone (DILAUDID) injection  0.5 mg Intravenous Once  .  HYDROmorphone (DILAUDID)  injection  0.5 mg Intravenous UD  . insulin aspart  0-5 Units Subcutaneous QHS  . insulin aspart  0-9 Units Subcutaneous TID WC  . insulin NPH  4 Units Subcutaneous QAC breakfast  . loratadine  10 mg Oral Daily  . vancomycin  1,000 mg Intravenous Q12H  . DISCONTD: atorvastatin  40 mg Oral q1800  . DISCONTD: HYDROmorphone  0.5 mg/hr Intravenous Once  . DISCONTD: insulin NPH  10 Units Subcutaneous BID  . DISCONTD: vancomycin  1,000 mg Intravenous Q24H  Continuous:     Assessment/Plan: Principal Problem:  *Drug-induced hypersensitivity reaction Active Problems:  CAD (coronary artery disease)  Fever  Lactic acidosis  Hand abscess  Hyponatremia  Hypertension  DM2 (diabetes mellitus, type 2)  Petechiae  She continues to be getting better.  L Hand infection/Bactrim Reaction/(MODERATE GROUP A STREP (S.PYOGENES) ISOLATED)/?Toxic Shock vrs Sepsis - POD #3 - looking better. No Fevers.  Wound care per Ortho. Continue Abx/Vanco.  Agree with ID.  Oral Clinda?  Petechiae Rash/redness/SQ edema/fever - Presumed Bactrim rash/Drug reaction- Bactrim D/ced. Supportive care. Doing better.   HTN - BP stable and S/P IVF. Hold antihypertensives. Hep lock IV.  CAD - no Angina   Lipids - on meds - can restart Crestor/Lipitor.   DeHydration and lactic Acidosis - Better.    Dysphagia - Swallowing better  Thrombocytopenia  - following - improved and > 100 again.  Holding anticoagulation for fear of Bleeding.  Skin looks a little better.  One Squeezer for DVT proph. Afraid to put on L Leg with her foot issue.   Confusion improved - CCT (-). Metabolic Encephalopathy from illness and Lactic acidosis.   Edema - One dose aldactone and follow.  Lasix would work better but I want to use the most inert molecule as she seems to be reacting to everything.  Mild Renal Insuff - Hydration helped. Cr is better.  Mild Hyponatremia - better  Rising LFTs - ? Congested - Diurese - If gets worse get Liver  US.  DM2 - Holding Metformin as her Lactic acid was high and using NPH/ISS.  CBGs improved.  On D/C consider Januvia/Onglyza/Trajenta but doubt she could afford?  Basename 07/02/12 0619 07/01/12 2116 07/01/12 1624  GLUCAP 129* 169* 111*   Now new skin rash??? - Zyrtec.Marland KitchenMarland KitchenIgE in Nml Range  PT/OT/CW... Anticipate D/C Sunday or Monday.  ID -  Anti-infectives     Start     Dose/Rate Route Frequency Ordered Stop   07/01/12 2000   vancomycin (VANCOCIN) IVPB 1000 mg/200 mL premix        1,000 mg 200 mL/hr over 60 Minutes Intravenous Every 12 hours 07/01/12 1308     06/29/12 0730   vancomycin (VANCOCIN) IVPB 1000 mg/200 mL premix  Status:  Discontinued        1,000 mg 200 mL/hr over 60 Minutes Intravenous Every 24 hours 06/29/12 0703 07/01/12 1308   06/29/12 0000   moxifloxacin (AVELOX) IVPB 400 mg  Status:  Discontinued        40 0 mg 250 mL/hr over 60 Minutes Intravenous Every 24 hours 06/28/12 2344 06/29/12 0726   06/28/12 1430   vancomycin (VANCOCIN) IVPB 1000 mg/200 mL premix        1,000 mg 200 mL/hr over 60 Minutes Intravenous  Once 06/28/12 1422 06/28/12 1548          LOS: 4 days   [name] [name] M 07/02/2012, 7:24 AM

## 2012-07-02 NOTE — Progress Notes (Signed)
Pt ambulated approx 300 feet with standby assistance. Pt tolerated well. Feet started to hurt from swelling. Placed pt back in bed with feet elevated.

## 2012-07-02 NOTE — Progress Notes (Signed)
Subjective: 3 Days Post-Op Procedure(s) (LRB): IRRIGATION AND DEBRIDEMENT EXTREMITY (Left) Patient reports pain as pain much improved.  feels better today..    Objective: Vital signs in last 24 hours: Temp:  [97.7 F (36.5 C)-98.2 F (36.8 C)] 97.7 F (36.5 C) (08/30 0502) Pulse Rate:  [82-85] 82  (08/30 0502) Resp:  [16-18] 18  (08/30 0502) BP: (99-112)/(62-69) 112/69 mmHg (08/30 0502) SpO2:  [94 %-95 %] 94 % (08/30 0502) Weight:  [79.516 kg (175 lb 4.8 oz)] 79.516 kg (175 lb 4.8 oz) (08/30 0500)  Intake/Output from previous day: 08/29 0701 - 08/30 0700 In: 200 [IV Piggyback:200] Out: 851 [Urine:850; Stool:1] Intake/Output this shift:     Basename 07/02/12 0500 07/01/12 0450 06/30/12 0500  HGB 9.6* 9.4* 10.1*    Basename 07/02/12 0500 07/01/12 0450  WBC 10.4 8.0  RBC 3.03* 2.94*  HCT 28.0* 27.2*  PLT 104* 69*    Basename 07/02/12 0500 07/01/12 0450  NA 137 134*  K 3.9 4.0  CL 105 106  CO2 23 20  BUN 12 23  CREATININE 0.49* 0.55  GLUCOSE 124* 85  CALCIUM 8.1* 7.4*   No results found for this basename: LABPT:2,INR:2 in the last 72 hours  intact sensation and capillary refill all digits.  wiggles fingers.  dressing c/d/i.  Assessment/Plan: 3 Days Post-Op Procedure(s) (LRB): IRRIGATION AND DEBRIDEMENT EXTREMITY (Left) Hydrotherapy every other day.  Will pick up therapy in office after discharge.  Kayla Weekes R 07/02/2012, 11:44 AM

## 2012-07-02 NOTE — Progress Notes (Signed)
[facility]    Date of Admission:  06/28/2012   Total days of antibiotics: 11        Day 5: vancomycin                 Principal Problem:  *Drug-induced hypersensitivity reaction Active Problems:  CAD (coronary artery disease)  Fever  Lactic acidosis  Hand abscess  Hyponatremia  Hypertension  DM2 (diabetes mellitus, type 2)  Petechiae  . antiseptic oral rinse  15 mL Mouth Rinse BID  . atorvastatin  20 mg Oral q1800  .  HYDROmorphone (DILAUDID) injection  0.5 mg Intravenous UD  . insulin aspart  0-5 Units Subcutaneous QHS  . insulin aspart  0-9 Units Subcutaneous TID WC  . insulin NPH  4 Units Subcutaneous QAC breakfast  . loratadine  10 mg Oral Daily  . spironolactone  25 mg Oral Once  . vancomycin  1,000 mg Intravenous Q12H  . DISCONTD: atorvastatin  40 mg Oral q1800   Subjective: Pt is feeling better. Afebrile and Tmax of 98.2. She denies any chest pain, cough, SOB, abdominal pain , dysuria or diarrhea. petechial rashes and edema improved.   Objective: Temp:  [97.7 F (36.5 C)-98.2 F (36.8 C)] 97.7 F (36.5 C) (08/30 1413) Pulse Rate:  [82-93] 93  (08/30 1413) Resp:  [16-18] 18  (08/30 1413) BP: (108-140)/(67-76) 140/76 mmHg (08/30 1413) SpO2:  [94 %-97 %] 97 % (08/30 1413) Weight:  [79.516 kg (175 lb 4.8 oz)] 79.516 kg (175 lb 4.8 oz) (08/30 0500) General: resting in bed  HEENT: PERRL, EOMI, no scleral icterus, swollen face improved  Cardiac: RRR, no rubs, murmurs or gallops  Pulm: clear to auscultation bilaterally, moving normal volumes of air  Abd: soft, nontender, nondistended, BS present  Ext: warm and well perfused,  Edema improved , Left hand dressing clean, dry, intact. petechial rash on bilateral knees and legs (lt>rt) improved and erythematous ulcer on dorsal part of th left foot, dry and clean base.  Neuro: alert and oriented X3, cranial nerves II-XII grossly intact  Lab Results Lab Results  Component Value Date   WBC  10.4 07/02/2012   HGB 9.6* 07/02/2012   HCT 28.0* 07/02/2012   MCV 92.4 07/02/2012   PLT 104* 07/02/2012    Lab Results  Component Value Date   CREATININE 0.49* 07/02/2012   BUN 12 07/02/2012   NA 137 07/02/2012   K 3.9 07/02/2012   CL 105 07/02/2012   CO2 23 07/02/2012    Lab Results  Component Value Date   ALT 61* 07/02/2012   AST 95* 07/02/2012   ALKPHOS 168* 07/02/2012   BILITOT 0.5 07/02/2012      Microbiology: Recent Results (from the past 240 hour(s))  SURGICAL PCR SCREEN     Status: Abnormal   Collection Time   06/22/12  7:14 PM      Component Value Range Status Comment   MRSA, PCR NEGATIVE  NEGATIVE Final    Staphylococcus aureus POSITIVE (*) NEGATIVE Final   ANAEROBIC CULTURE     Status: Normal   Collection Time   06/22/12  8:57 PM      Component Value Range Status Comment   Specimen Description ABSCESS HAND LEFT   Final    Special Requests NONE   Final    Gram Stain     Final    Value: RARE WBC PRESENT, PREDOMINANTLY PMN     RARE SQUAMOUS EPITHELIAL CELLS PRESENT  FEW GRAM POSITIVE COCCI     IN PAIRS   Culture NO ANAEROBES ISOLATED   Final    Report Status 06/27/2012 FINAL   Final   CULTURE, ROUTINE-ABSCESS     Status: Normal   Collection Time   06/22/12  8:57 PM      Component Value Range Status Comment   Specimen Description ABSCESS HAND LEFT   Final    Special Requests NONE   Final    Gram Stain     Final    Value: RARE WBC PRESENT, PREDOMINANTLY PMN     RARE SQUAMOUS EPITHELIAL CELLS PRESENT     FEW GRAM POSITIVE COCCI     IN PAIRS   Culture MODERATE GROUP A STREP (S.PYOGENES) ISOLATED   Final    Report Status 06/25/2012 FINAL   Final   FUNGUS CULTURE W SMEAR     Status: Normal (Preliminary result)   Collection Time   06/22/12  8:57 PM      Component Value Range Status Comment   Specimen Description ABSCESS HAND LEFT   Final    Special Requests NONE   Final    Fungal Smear NO YEAST OR FUNGAL ELEMENTS SEEN   Final    Culture CULTURE IN PROGRESS FOR FOUR  WEEKS   Final    Report Status PENDING   Incomplete   AFB CULTURE WITH SMEAR     Status: Normal (Preliminary result)   Collection Time   06/22/12  8:57 PM      Component Value Range Status Comment   Specimen Description ABSCESS HAND LEFT   Final    Special Requests NONE   Final    ACID FAST SMEAR NO ACID FAST BACILLI SEEN   Final    Culture     Final    Value: CULTURE WILL BE EXAMINED FOR 6 WEEKS BEFORE ISSUING A FINAL REPORT   Report Status PENDING   Incomplete   CULTURE, BLOOD (ROUTINE X 2)     Status: Normal (Preliminary result)   Collection Time   06/28/12 12:40 PM      Component Value Range Status Comment   Specimen Description BLOOD FOREARM RIGHT   Final    Special Requests BOTTLES DRAWN AEROBIC ONLY   Final    Culture  Setup Time 06/28/2012 18:03   Final    Culture     Final    Value:        BLOOD CULTURE RECEIVED NO GROWTH TO DATE CULTURE WILL BE HELD FOR 5 DAYS BEFORE ISSUING A FINAL NEGATIVE REPORT   Report Status PENDING   Incomplete   URINE CULTURE     Status: Normal   Collection Time   06/28/12  1:10 PM      Component Value Range Status Comment   Specimen Description URINE, CATHETERIZED   Final    Special Requests NONE   Final    Culture  Setup Time 06/28/2012 20:21   Final    Colony Count NO GROWTH   Final    Culture NO GROWTH   Final    Report Status 06/29/2012 FINAL   Final   CULTURE, BLOOD (ROUTINE X 2)     Status: Normal (Preliminary result)   Collection Time   06/28/12  1:20 PM      Component Value Range Status Comment   Specimen Description BLOOD HAND RIGHT   Final    Special Requests BOTTLES DRAWN AEROBIC ONLY 8CC   Final    Culture  Setup Time 06/28/2012 18:24   Final    Culture     Final    Value:        BLOOD CULTURE RECEIVED NO GROWTH TO DATE CULTURE WILL BE HELD FOR 5 DAYS BEFORE ISSUING A FINAL NEGATIVE REPORT   Report Status PENDING   Incomplete   CULTURE, BLOOD (ROUTINE X 2)     Status: Normal (Preliminary result)   Collection Time   06/29/12  12:15 AM      Component Value Range Status Comment   Specimen Description BLOOD RIGHT HAND   Final    Special Requests BOTTLES DRAWN AEROBIC AND ANAEROBIC 5CC EACH   Final    Culture  Setup Time 06/29/2012 04:32   Final    Culture     Final    Value:        BLOOD CULTURE RECEIVED NO GROWTH TO DATE CULTURE WILL BE HELD FOR 5 DAYS BEFORE ISSUING A FINAL NEGATIVE REPORT   Report Status PENDING   Incomplete   CULTURE, BLOOD (ROUTINE X 2)     Status: Normal (Preliminary result)   Collection Time   06/29/12 12:20 AM      Component Value Range Status Comment   Specimen Description BLOOD LEFT ARM   Final    Special Requests BOTTLES DRAWN AEROBIC AND ANAEROBIC 5CC EACH   Final    Culture  Setup Time 06/29/2012 04:32   Final    Culture     Final    Value:        BLOOD CULTURE RECEIVED NO GROWTH TO DATE CULTURE WILL BE HELD FOR 5 DAYS BEFORE ISSUING A FINAL NEGATIVE REPORT   Report Status PENDING   Incomplete   WOUND CULTURE     Status: Normal   Collection Time   06/29/12 11:06 AM      Component Value Range Status Comment   Specimen Description WOUND LEFT HAND   Final    Special Requests DORSAL PART OF LEFT HAND   Final    Gram Stain     Final    Value: FEW WBC PRESENT,BOTH PMN AND MONONUCLEAR     NO SQUAMOUS EPITHELIAL CELLS SEEN     NO ORGANISMS SEEN   Culture NO GROWTH 2 DAYS   Final    Report Status 07/01/2012 FINAL   Final   ANAEROBIC CULTURE     Status: Normal (Preliminary result)   Collection Time   06/29/12 11:06 AM      Component Value Range Status Comment   Specimen Description WOUND LEFT HAND   Final    Special Requests DORSAL PART OF LEFT HAND   Final    Gram Stain PENDING   Incomplete    Culture     Final    Value: NO ANAEROBES ISOLATED; CULTURE IN PROGRESS FOR 5 DAYS   Report Status PENDING   Incomplete    Recent Results (from the past 240 hour(s))   SURGICAL PCR SCREEN Status: Abnormal    Collection Time    06/22/12 7:14 PM   Component  Value  Range  Status  Comment     MRSA, PCR  NEGATIVE  NEGATIVE  Final     Staphylococcus aureus  POSITIVE (*)  NEGATIVE  Final    ANAEROBIC CULTURE Status: Normal    Collection Time    06/22/12 8:57 PM   Component  Value  Range  Status  Comment    Specimen Description  ABSCESS HAND LEFT   Final  Special Requests  NONE   Final     Gram Stain    Final     Value:  RARE WBC PRESENT, PREDOMINANTLY PMN     RARE SQUAMOUS EPITHELIAL CELLS PRESENT     FEW GRAM POSITIVE COCCI     IN PAIRS    Culture  NO ANAEROBES ISOLATED   Final     Report Status  06/27/2012 FINAL   Final    CULTURE, ROUTINE-ABSCESS Status: Normal    Collection Time    06/22/12 8:57 PM   Component  Value  Range  Status  Comment    Specimen Description  ABSCESS HAND LEFT   Final     Special Requests  NONE   Final     Gram Stain    Final     Value:  RARE WBC PRESENT, PREDOMINANTLY PMN     RARE SQUAMOUS EPITHELIAL CELLS PRESENT     FEW GRAM POSITIVE COCCI     IN PAIRS    Culture  MODERATE GROUP A STREP (S.PYOGENES) ISOLATED   Final     Report Status  06/25/2012 FINAL   Final    FUNGUS CULTURE W SMEAR Status: Normal (Preliminary result)    Collection Time    06/22/12 8:57 PM   Component  Value  Range  Status  Comment    Specimen Description  ABSCESS HAND LEFT   Final     Special Requests  NONE   Final     Fungal Smear  NO YEAST OR FUNGAL ELEMENTS SEEN   Final     Culture  CULTURE IN PROGRESS FOR FOUR WEEKS   Final     Report Status  PENDING   Incomplete    AFB CULTURE WITH SMEAR Status: Normal (Preliminary result)    Collection Time    06/22/12 8:57 PM   Component  Value  Range  Status  Comment    Specimen Description  ABSCESS HAND LEFT   Final     Special Requests  NONE   Final     ACID FAST SMEAR  NO ACID FAST BACILLI SEEN   Final     Culture    Final     Value:  CULTURE WILL BE EXAMINED FOR 6 WEEKS BEFORE ISSUING A FINAL REPORT    Report Status  PENDING   Incomplete    CULTURE, BLOOD (ROUTINE X 2) Status: Normal (Preliminary result)     Collection Time    06/28/12 12:40 PM   Component  Value  Range  Status  Comment    Specimen Description  BLOOD FOREARM RIGHT   Final     Special Requests  BOTTLES DRAWN AEROBIC ONLY   Final     Culture Setup Time  06/28/2012 18:03   Final     Culture    Final     Value:  BLOOD CULTURE RECEIVED NO GROWTH TO DATE CULTURE WILL BE HELD FOR 5 DAYS BEFORE ISSUING A FINAL NEGATIVE REPORT    Report Status  PENDING   Incomplete    URINE CULTURE Status: Normal    Collection Time    06/28/12 1:10 PM   Component  Value  Range  Status  Comment    Specimen Description  URINE, CATHETERIZED   Final     Special Requests  NONE   Final     Culture Setup Time  06/28/2012 20:21   Final     Colony Count  NO GROWTH   Final  Culture  NO GROWTH   Final     Report Status  06/29/2012 FINAL   Final    CULTURE, BLOOD (ROUTINE X 2) Status: Normal (Preliminary result)    Collection Time    06/28/12 1:20 PM   Component  Value  Range  Status  Comment    Specimen Description  BLOOD HAND RIGHT   Final     Special Requests  BOTTLES DRAWN AEROBIC ONLY 8CC   Final     Culture Setup Time  06/28/2012 18:24   Final     Culture    Final     Value:  BLOOD CULTURE RECEIVED NO GROWTH TO DATE CULTURE WILL BE HELD FOR 5 DAYS BEFORE ISSUING A FINAL NEGATIVE REPORT    Report Status  PENDING   Incomplete    CULTURE, BLOOD (ROUTINE X 2) Status: Normal (Preliminary result)    Collection Time    06/29/12 12:15 AM   Component  Value  Range  Status  Comment    Specimen Description  BLOOD RIGHT HAND   Final     Special Requests  BOTTLES DRAWN AEROBIC AND ANAEROBIC 5CC EACH   Final     Culture Setup Time  06/29/2012 04:32   Final     Culture    Final     Value:  BLOOD CULTURE RECEIVED NO GROWTH TO DATE CULTURE WILL BE HELD FOR 5 DAYS BEFORE ISSUING A FINAL NEGATIVE REPORT    Report Status  PENDING   Incomplete    CULTURE, BLOOD (ROUTINE X 2) Status: Normal (Preliminary result)    Collection Time    06/29/12 12:20 AM   Component   Value  Range  Status  Comment    Specimen Description  BLOOD LEFT ARM   Final     Special Requests  BOTTLES DRAWN AEROBIC AND ANAEROBIC 5CC EACH   Final     Culture Setup Time  06/29/2012 04:32   Final     Culture    Final     Value:  BLOOD CULTURE RECEIVED NO GROWTH TO DATE CULTURE WILL BE HELD FOR 5 DAYS BEFORE ISSUING A FINAL NEGATIVE REPORT    Report Status  PENDING   Incomplete    WOUND CULTURE Status: Normal (Preliminary result)    Collection Time    06/29/12 11:06 AM   Component  Value  Range  Status  Comment    Specimen Description  WOUND LEFT HAND   Final     Special Requests  DORSAL PART OF LEFT HAND   Final     Gram Stain    Final     Value:  FEW WBC PRESENT,BOTH PMN AND MONONUCLEAR     NO SQUAMOUS EPITHELIAL CELLS SEEN     NO ORGANISMS SEEN    Culture  NO GROWTH 1 DAY   Final     Report Status  PENDING   Incomplete    ANAEROBIC CULTURE Status: Normal (Preliminary result)    Collection Time    06/29/12 11:06 AM   Component  Value  Range  Status  Comment    Specimen Description  WOUND LEFT HAND   Final     Special Requests  DORSAL PART OF LEFT HAND   Final     Gram Stain  PENDING   Incomplete     Culture    Final     Value:  NO ANAEROBES ISOLATED; CULTURE IN PROGRESS FOR 5 DAYS    Report Status  PENDING  Incomplete     Assessment and Plan:  Drug-induced hypersensitivity reaction: [name] [name] is a 66 y.o. female PMH of DM type 2, CAD s/p coronary angioplasty with stent placement on 2004 was admitted to [facility]. [place] 1 week before (06/22/12) with group A Step. Pyogens causing abscess on left hand. I and D done by Dr. Merlyn Lot on the same day on 8/20. Discharged to home with oral bactrim. But appeared petechial rash on bilateral knees and legs (lt>rt) and redness associated with fever 103.2 and readmitted on 8/26 with possible causes of allergic reaction from Bactrim and discontinued. Avelox given and within half an hour edema developed all over the body. Possible reaction  from Avelox or previously given Bactrim. Symptoms improved with Benedryl. And restarted vancomycin.   S/p 2nd I and D on left hand 2 days ago. Pt has been afebrile and Tmax 98.2 , Vs stable and WBC 10.4  this morning lab. Pt does not look toxic. Chest x-ray from 8/27 shows increased atelectasis at the left lung base. All symptoms improved. Platelet count also  improving to 104 this morning lab.  1. Continue Vancomycin1,000 mg, IV, Q24H and oral clindamycin on d/c  2. F/u blood culture from 8/27  3. F/u wound culture from 8/27  4. F/u CBC and BMP  5. Encouraged to use Incentive spirometer frequently    Junious Silk 07/02/2012, 3:53 PM  INFECTIOUS DISEASE ATTENDING ADDENDUM:     [facility]   Date: 07/02/2012  Patient name: [name] [name]  Medical record number: [id number]  Date of birth: 07-31-1946    This patient has been seen and discussed with the house staff. Please see their note for complete details. I concur with their findings with the following additions/corrections:  Patient doing very well. Would continue vancomycin in house and then change to oral clindamycin 300mg  every six hours to complete a total of 11 more days of antibiotics, 14 days postoperatively.  We would be happy to follow up with her in our clinic.  I will sign off for now otherwise. Please call with further questions.   Acey Lav 07/02/2012, 6:10 PM

## 2012-07-02 NOTE — Progress Notes (Signed)
PT Cancellation Note  Treatment cancelled today due to pt just ambulated with [facility].  Will f/u tomorrow to ensure safety prior to anticipated D/C Sunday per pt.    Sunny Schlein, [place] [phone] 07/02/2012, 3:08 PM

## 2012-07-03 DIAGNOSIS — D696 Thrombocytopenia, unspecified: Secondary | ICD-10-CM | POA: Diagnosis not present

## 2012-07-03 LAB — CBC
HCT: 26.7 % — ABNORMAL LOW (ref 36.0–46.0)
MCHC: 34.1 g/dL (ref 30.0–36.0)
Platelets: 143 10*3/uL — ABNORMAL LOW (ref 150–400)
RDW: 14.5 % (ref 11.5–15.5)

## 2012-07-03 LAB — COMPREHENSIVE METABOLIC PANEL
ALT: 66 U/L — ABNORMAL HIGH (ref 0–35)
Alkaline Phosphatase: 172 U/L — ABNORMAL HIGH (ref 39–117)
CO2: 27 mEq/L (ref 19–32)
Chloride: 109 mEq/L (ref 96–112)
GFR calc Af Amer: >90 mL/min (ref >90)
GFR calc non Af Amer: >90 mL/min (ref >90)
Glucose, Bld: 140 mg/dL — ABNORMAL HIGH (ref 70–99)
Potassium: 3.6 mEq/L (ref 3.5–5.1)
Sodium: 143 mEq/L (ref 135–145)
Total Bilirubin: 0.5 mg/dL (ref 0.3–1.2)
Total Protein: 4.7 g/dL — ABNORMAL LOW (ref 6.0–8.3)

## 2012-07-03 LAB — BETA-HYDROXYBUTYRIC ACID: Beta-Hydroxybutyric Acid: 0.25 mmol/L

## 2012-07-03 MED ORDER — CLINDAMYCIN HCL 300 MG PO CAPS: 300.0000 mg | ORAL_CAPSULE | Freq: Four times a day (QID) | ORAL | Status: AC

## 2012-07-03 MED ORDER — OXYCODONE-ACETAMINOPHEN 5-325 MG PO TABS: 1.0000 | ORAL_TABLET | Freq: Four times a day (QID) | ORAL | Status: DC | PRN

## 2012-07-03 NOTE — Discharge Summary (Signed)
DISCHARGE SUMMARY  [name] [name]  MR#: [id number]  DOB:[birth year]  Date of Admission: 06/28/2012 Date of Discharge: 07/03/2012  Attending Physician: [name] Pounds, MD  Patient's PCP: [name] Pounds, MD  Consults: [name] Ribas, MD- Orthopaedics [name] Lav, MD- Infectious Disease  Discharge Diagnoses: Principal Problem:  *Drug-induced hypersensitivity reaction secondary to Bactrim Active Problems:  Left Hand abscess s/p I&D  Fever  Lactic acidosis  Hyponatremia  Petechiae  Thrombocytopenia  Elevated liver function tests  Peripheral Edema secondary to hypoalbuminemia  CAD (coronary artery disease)  Hypertension  DM2 (diabetes mellitus, type 2)  Past Medical History  Diagnosis Date  . Coronary artery disease     s/p stent to LAD and LCx  . Hyperlipidemia   . Hypertension   . Diabetes mellitus   . Myocardial infarction     7 yrs ago    Past Surgical History  Procedure Date  . Coronary angioplasty with stent placement 04/26/2003    NORMAL. EF 65%  . Cesarean section   . I&d extremity 06/22/2012    Procedure: IRRIGATION AND DEBRIDEMENT EXTREMITY;  Surgeon: [name] Ribas, MD;  Location: [facility] OR;  Service: Orthopedics;  Laterality: Left;  . I&d extremity 06/29/2012    Procedure: IRRIGATION AND DEBRIDEMENT EXTREMITY;  Surgeon: [name] Ribas, MD;  Location: [facility] OR;  Service: Orthopedics;  Laterality: Left;    Discharge Medications: Medication List  As of 07/03/2012 12:00 PM   STOP taking these medications         neomycin-bacitracin-polymyxin [phone] ointment      ondansetron 8 MG tablet         TAKE these medications         aspirin 325 MG EC tablet   Take 325 mg by mouth daily.      CALCIUM + D PO   Take 1 tablet by mouth daily.      clindamycin 300 MG capsule   Commonly known as: CLEOCIN   Take 1 capsule (300 mg total) by mouth 4 (four) times daily.      fenofibrate micronized 200 MG capsule   Commonly known as: LOFIBRA   Take 200 mg by mouth  daily before breakfast.      fish oil-omega-3 fatty acids 1000 MG capsule   Take 3 g by mouth daily.      metFORMIN 500 MG tablet   Commonly known as: GLUCOPHAGE   Take 500 mg by mouth 2 (two) times daily with a meal.      metoprolol tartrate 25 MG tablet   Commonly known as: LOPRESSOR   Take 12.5 mg by mouth 2 (two) times daily.      nitroGLYCERIN 0.4 MG SL tablet   Commonly known as: NITROSTAT   Place 0.4 mg under the tongue every 5 (five) minutes as needed. For chest pain      oxyCODONE-acetaminophen 5-325 MG per tablet   Commonly known as: PERCOCET/ROXICET   Take 1-2 tablets by mouth every 6 (six) hours as needed. For pain.      rosuvastatin 20 MG tablet   Commonly known as: CRESTOR   Take 10 mg by mouth daily.            Hospital Procedures: Ct Head Wo Contrast 06/28/2012  *RADIOLOGY REPORT*  Clinical Data:  Fall.  Altered mental status.  Fever  CT HEAD WITHOUT CONTRAST  Technique:  Contiguous axial images were obtained from the base of the skull through the vertex without contrast  Comparison:  None.  Findings:  The brain has a normal appearance without evidence for hemorrhage, acute infarction, hydrocephalus, or mass lesion.  There is no extra axial fluid collection.  The skull and paranasal sinuses are normal.  IMPRESSION: Normal CT of the head without contrast.   Original Report Authenticated By: Camelia [name], M.D.    Dg Chest Port 1 View 06/29/2012  *RADIOLOGY REPORT*  Clinical Data: Fever and swelling.  PORTABLE CHEST - 1 VIEW  Comparison: 06/28/2012  Findings: There is some increased density at the left lung base likely representing atelectasis.  Probable underlying chronic lung disease is stable.  Heart size is normal.  No edema or pleural fluid.  IMPRESSION: Increased atelectasis at the left lung base.   Original Report Authenticated By: [name] [name], M.D.    Dg Chest Port 1 View 06/28/2012  *RADIOLOGY REPORT*  Clinical Data: Weakness.  Dizziness.   Hypertension.  Diabetic. Prior cardiac catheterization and coronary stents.  PORTABLE CHEST - 1 VIEW  Comparison: 06/22/2012  Findings: Numerous leads and wires project over the chest.  Midline trachea.  Borderline cardiomegaly.     Mediastinal contours otherwise within normal limits.  Mild right hemidiaphragm elevation. No pleural effusion or pneumothorax.  No congestive failure.  Patchy atelectasis or scarring at the left lung base is not significantly changed.  IMPRESSION:  1. No acute cardiopulmonary disease. 2.  Similar left base atelectasis or scarring. 3.  Borderline cardiomegaly, without acute disease.   Original Report Authenticated By: [name] [name], M.D.    Left Hand I&D (06/29/12)- Dr. Betha [name]  Hydrotherapy Left Hand  History of Present Illness (from [name] [name] H&P): 64 F S/P recent Strep Hand Abscess S/P I and D (8/20) and D/ced on Bactrim has had a tough week. She has been intermittently confused, she has had some nausea requiring zofran, Wildly fluctuating temperatures 98-104, Weakness, Petechial Rash and other issues. She also lacerated her foot and now has an eschar with ecchymoses and petechiae and no appreciated cellulitis. L hand and wrist is wrapped and already evaluated by Dr [name] [name] - see his notes. She is also Pink and has diffuse subcutaneous edema. She is having some dysphagia but no angioedema, no tongue swelling, no wheezing and no hives. Her rash is diffuse petechiael but not desquamating. NO TEN appreciated. Mild pain. She did not remember that I saw her in the hospital last week. Labs show Mild Hyponatremia. WBC only 6.2. She came to the ED today for eval and Rx after she went down at home. She was Cxed up. Bactrim stopped,. Vanco started. Fluids given for mild hypotension. i am evaluating her in step down. Her daughter is a Engineer, mining here and helps c her HPI.   Hospital Course: [name] [name] was admitted to a medical bed further management of her systemic illness in the  setting of her left hand infection. This is felt most likely to represent a hypersensitivity reaction to Bactrim, though toxic shock syndrome was also in the differential.  Her Bactrim was discontinued and she was switched to vancomycin and Avelox. However, after having a progression of her rash Avelox was also discontinued in case it was contributing. Infectious disease saw the patient and recommended continuing vancomycin alone for her strep hand abscess. With this treatment her systemic illness improved significantly with improvement in her metabolic acidosis, transient thrombocytopenia, rash, and systemic symptoms. Dr. Merlyn [name], Hand orthopedics, reevaluated the patient and recommended a repeat incision and drainage which was performed on 8/27. Following this, she received hydrotherapy every  other day. With this management her hand is improving with no further evidence of systemic illness. Her white blood count has been stable at 7.9.  Blood cultures obtained on admission are negative to date. Wound culture from the OR are also negative to date.  At this time, the patient is stable for discharge home. She'll be transitioned to clindamycin to complete a 14 day course (11 more days).  He has scheduled followup with Dr. Merlyn [name] on Tuesday. Her daughter is a Engineer, civil (consulting) and will also be available to help.   Day of Discharge Exam BP 147/75  Pulse 85  Temp 97.5 F (36.4 C) (Oral)  Resp 18  Ht 5\' 2"  (1.575 m)  Wt 78 kg (171 lb 15.3 oz)  BMI 31.45 kg/m2  SpO2 95%  Physical Exam: General appearance: fatigued, no acute distress  Eyes: no scleral icterus Throat: oropharynx moist without erythema Resp: clear to auscultation bilaterally Cardio: regular rate and rhythm, S1, S2 normal, no murmur, click, rub or gallop GI: soft, non-tender; bowel sounds normal; no masses,  no organomegaly Extremities: no clubbing, cyanosis; left hand splint in place with no surrounding erythema. Left dorsal foot petechial rash receding  with no erythema.   Discharge Labs:  [facility] 07/03/12 0625 07/02/12 0500  NA 143 137  K 3.6 3.9  CL 109 105  CO2 27 23  GLUCOSE 140* 124*  BUN 7 12  CREATININE 0.51 0.49*  CALCIUM 8.3* 8.1*  MG -- --  PHOS -- --    Basename 07/03/12 0625 07/02/12 0500  AST 64* 95*  ALT 66* 61*  ALKPHOS 172* 168*  BILITOT 0.5 0.5  PROT 4.7* 4.5*  ALBUMIN 2.1* 1.9*    Basename 07/03/12 0625 07/02/12 0500  WBC 7.9 10.4  NEUTROABS -- --  HGB 9.1* 9.6*  HCT 26.7* 28.0*  MCV 92.7 92.4  PLT 143* 104*   Lab Results  Component Value Date   INR 1.41 06/29/2012   Other labs: Blood cultures (8/26)-negative to date Wound culture from the OR (8/27)- negative to date INR 1.41 on admission IgE 20.7 (normal) Pro-calcitonin 69.85 on admission Lactic acid 2.0 on admission  Discharge instructions: Discharge Orders    Future Orders Please Complete By Expires   Diet - low sodium heart healthy      Comments:   Diabetic diet   Increase activity slowly      Discharge instructions      Comments:   Leave left hand splint/dressing in place until you see Dr. Merlyn [name] on Tuesday.  Call if fever above 101.5, redness, rash.  Call if side effects develop with Clindamycin.      Disposition:  To home  Follow-up Appts: Follow-up with Dr. Timothy Lasso at [facility] within 1 week.  Office will call for appointment.  Condition on Discharge: improved/stable  Tests Needing Follow-up: Followup blood and wound cultures.  Repeat CBC and CMET  With discharge activities: 45 minutes  Signed: Sye Schroepfer,W DOUGLAS 07/03/2012, 12:00 PM

## 2012-07-03 NOTE — Progress Notes (Signed)
PT Cancellation Note  Treatment cancelled today due to patient's refusal to participate secondary to pain in her left hand.  Husband reports patient ambulated around the nursing unit on yesterday.  Pt. Reports she is being d/c'd today.  [name] [name], [place] [phone] 07/03/2012, 1:43 PM

## 2012-07-03 NOTE — Progress Notes (Signed)
Hydrotherapy Treatment Note   07/03/12 1028  Subjective Assessment  Subjective patient reports she does not like to look at it  Evaluation and Treatment  Evaluation and Treatment Procedures Explained to Patient/Family Yes  Evaluation and Treatment Procedures agreed to  Wound 06/29/12 Other (Comment) Hand Left;Other (Comment) Dorsum of L hand open I+D  Date First Assessed/Time First Assessed: 06/29/12 0900   Wound Type: Other (Comment)  Location: Hand  Location Orientation: Left;Other (Comment)  Wound Description (Comments): Dorsum of L hand open I+D  Present on Admission: Yes  Site / Wound Assessment Granulation tissue;Painful;Pink;Red  % Wound base Red or Granulating 60%  % Wound base Yellow 20%  % Wound base Black 0%  % Wound base Other (Comment) 20%  Peri-wound Assessment Edema;Erythema (blanchable);Maceration (tendon and blood vessels)  Margins Unattacted edges (unapproximated)  Closure None  Drainage Amount Moderate  Drainage Description Serous;Purulent  Treatment Hydrotherapy (Pulse lavage)  Dressing Type Gauze (Comment);Compression wrap (paking with iodoform gauze, covered with petroleum gauze, )  Dressing Changed Changed  Dressing Status Clean;Dry;Intact  Wound 07/01/12 Other (Comment) Hand Left;Other (Comment) Dorsum L Ring Finger open I+D  Date First Assessed/Time First Assessed: 07/01/12 1145   Wound Type: Other (Comment)  Location: Hand  Location Orientation: Left;Other (Comment)  Wound Description (Comments): Dorsum L Ring Finger open I+D  Present on Admission: No  Site / Wound Assessment Granulation tissue;Pink;Painful  % Wound base Red or Granulating 70%  % Wound base Yellow 10%  % Wound base Black 0%  % Wound base Other (Comment) 20% (tendon)  Peri-wound Assessment Edema;Erythema (non-blanchable);Purple  Margins Unattacted edges (unapproximated)  Closure None  Drainage Amount Moderate  Drainage Description Serosanguineous  Treatment Hydrotherapy (Pulse lavage)    Dressing Type Impregnated gauze (petrolatum) (iodoform packing, ABD, kerlix, ace)  Dressing Changed Changed  Dressing Status Clean;Dry;Intact  Hydrotherapy  Pulsed Lavage with Suction (psi) 4 psi  Pulsed Lavage with Suction - Normal Saline Used 1000 mL  Pulsed Lavage Tip Tip with splash shield  Pulsed lavage therapy - wound location Dorsum L hand and ring finger  Wound Therapy - Assess/Plan/Recommendations  Wound Therapy - Clinical Statement wound with more granulation tissue than evaluation, patient still limited by pain.    Wound Therapy - Functional Problem List Open infected wound on dorsum of L hand.    Factors Delaying/Impairing Wound Healing Infection - systemic/local;Polypharmacy  Hydrotherapy Plan Debridement;Dressing change;Pulsatile lavage with suction;Patient/family education  Wound Therapy - Frequency 3X / week  Wound Therapy - Follow Up Recommendations [facility]  Wound Plan continue hydrotherapy 3x/week while in hospital  Wound Therapy Goals - Improve the function of patient's integumentary system by progressing the wound(s) through the phases of wound healing by:  Decrease Necrotic Tissue - Progress Progressing toward goal  Increase Granulation Tissue to 50%  Increase Granulation Tissue - Progress Met  Improve Drainage Characteristics - Progress Progressing toward goal  Patient/Family will be able to  pt and family will be able to demonstrate UE positioning for edema control and dressing changes.    Patient/Family Instruction Goal - Progress Met    New goals set today: Increase granulation tissue to 90%.  07/03/2012 Olivia Canter, PT [phone]

## 2012-07-04 LAB — CULTURE, BLOOD (ROUTINE X 2): Culture: NO GROWTH

## 2012-07-04 LAB — ANAEROBIC CULTURE

## 2012-07-05 LAB — CULTURE, BLOOD (ROUTINE X 2): Culture: NO GROWTH

## 2012-07-06 DIAGNOSIS — L02519 Cutaneous abscess of unspecified hand: Secondary | ICD-10-CM | POA: Diagnosis not present

## 2012-07-06 DIAGNOSIS — L03119 Cellulitis of unspecified part of limb: Secondary | ICD-10-CM | POA: Diagnosis not present

## 2012-07-08 DIAGNOSIS — R21 Rash and other nonspecific skin eruption: Secondary | ICD-10-CM | POA: Diagnosis not present

## 2012-07-08 DIAGNOSIS — L02519 Cutaneous abscess of unspecified hand: Secondary | ICD-10-CM | POA: Diagnosis not present

## 2012-07-08 DIAGNOSIS — I10 Essential (primary) hypertension: Secondary | ICD-10-CM | POA: Diagnosis not present

## 2012-07-08 DIAGNOSIS — L03119 Cellulitis of unspecified part of limb: Secondary | ICD-10-CM | POA: Diagnosis not present

## 2012-07-09 DIAGNOSIS — L02519 Cutaneous abscess of unspecified hand: Secondary | ICD-10-CM | POA: Diagnosis not present

## 2012-07-09 DIAGNOSIS — L03119 Cellulitis of unspecified part of limb: Secondary | ICD-10-CM | POA: Diagnosis not present

## 2012-07-12 DIAGNOSIS — L02519 Cutaneous abscess of unspecified hand: Secondary | ICD-10-CM | POA: Diagnosis not present

## 2012-07-12 DIAGNOSIS — L03119 Cellulitis of unspecified part of limb: Secondary | ICD-10-CM | POA: Diagnosis not present

## 2012-07-14 DIAGNOSIS — L02519 Cutaneous abscess of unspecified hand: Secondary | ICD-10-CM | POA: Diagnosis not present

## 2012-07-14 DIAGNOSIS — L03119 Cellulitis of unspecified part of limb: Secondary | ICD-10-CM | POA: Diagnosis not present

## 2012-07-16 DIAGNOSIS — L03119 Cellulitis of unspecified part of limb: Secondary | ICD-10-CM | POA: Diagnosis not present

## 2012-07-16 DIAGNOSIS — L02519 Cutaneous abscess of unspecified hand: Secondary | ICD-10-CM | POA: Diagnosis not present

## 2012-07-19 ENCOUNTER — Inpatient Hospital Stay: Payer: Medicare Other | Admitting: Internal Medicine

## 2012-07-19 DIAGNOSIS — L03119 Cellulitis of unspecified part of limb: Secondary | ICD-10-CM | POA: Diagnosis not present

## 2012-07-19 DIAGNOSIS — L03019 Cellulitis of unspecified finger: Secondary | ICD-10-CM | POA: Diagnosis not present

## 2012-07-19 DIAGNOSIS — L02519 Cutaneous abscess of unspecified hand: Secondary | ICD-10-CM | POA: Diagnosis not present

## 2012-07-19 LAB — FUNGUS CULTURE W SMEAR: Fungal Smear: NONE SEEN

## 2012-07-21 DIAGNOSIS — L03019 Cellulitis of unspecified finger: Secondary | ICD-10-CM | POA: Diagnosis not present

## 2012-07-21 DIAGNOSIS — L03119 Cellulitis of unspecified part of limb: Secondary | ICD-10-CM | POA: Diagnosis not present

## 2012-07-21 DIAGNOSIS — L02519 Cutaneous abscess of unspecified hand: Secondary | ICD-10-CM | POA: Diagnosis not present

## 2012-07-23 DIAGNOSIS — L02519 Cutaneous abscess of unspecified hand: Secondary | ICD-10-CM | POA: Diagnosis not present

## 2012-07-23 DIAGNOSIS — L03019 Cellulitis of unspecified finger: Secondary | ICD-10-CM | POA: Diagnosis not present

## 2012-07-23 DIAGNOSIS — L03119 Cellulitis of unspecified part of limb: Secondary | ICD-10-CM | POA: Diagnosis not present

## 2012-07-26 DIAGNOSIS — L03119 Cellulitis of unspecified part of limb: Secondary | ICD-10-CM | POA: Diagnosis not present

## 2012-07-26 DIAGNOSIS — L03019 Cellulitis of unspecified finger: Secondary | ICD-10-CM | POA: Diagnosis not present

## 2012-07-26 DIAGNOSIS — L02519 Cutaneous abscess of unspecified hand: Secondary | ICD-10-CM | POA: Diagnosis not present

## 2012-07-28 DIAGNOSIS — L03019 Cellulitis of unspecified finger: Secondary | ICD-10-CM | POA: Diagnosis not present

## 2012-07-28 DIAGNOSIS — L02519 Cutaneous abscess of unspecified hand: Secondary | ICD-10-CM | POA: Diagnosis not present

## 2012-07-28 DIAGNOSIS — L03119 Cellulitis of unspecified part of limb: Secondary | ICD-10-CM | POA: Diagnosis not present

## 2012-07-29 DIAGNOSIS — E119 Type 2 diabetes mellitus without complications: Secondary | ICD-10-CM | POA: Diagnosis not present

## 2012-07-29 DIAGNOSIS — R21 Rash and other nonspecific skin eruption: Secondary | ICD-10-CM | POA: Diagnosis not present

## 2012-07-29 DIAGNOSIS — Z23 Encounter for immunization: Secondary | ICD-10-CM | POA: Diagnosis not present

## 2012-07-29 DIAGNOSIS — I2510 Atherosclerotic heart disease of native coronary artery without angina pectoris: Secondary | ICD-10-CM | POA: Diagnosis not present

## 2012-07-29 DIAGNOSIS — E785 Hyperlipidemia, unspecified: Secondary | ICD-10-CM | POA: Diagnosis not present

## 2012-07-30 DIAGNOSIS — L02519 Cutaneous abscess of unspecified hand: Secondary | ICD-10-CM | POA: Diagnosis not present

## 2012-07-30 DIAGNOSIS — L03119 Cellulitis of unspecified part of limb: Secondary | ICD-10-CM | POA: Diagnosis not present

## 2012-07-30 DIAGNOSIS — L03019 Cellulitis of unspecified finger: Secondary | ICD-10-CM | POA: Diagnosis not present

## 2012-08-01 DIAGNOSIS — L02519 Cutaneous abscess of unspecified hand: Secondary | ICD-10-CM | POA: Diagnosis not present

## 2012-08-01 DIAGNOSIS — L03119 Cellulitis of unspecified part of limb: Secondary | ICD-10-CM | POA: Diagnosis not present

## 2012-08-01 DIAGNOSIS — R21 Rash and other nonspecific skin eruption: Secondary | ICD-10-CM | POA: Diagnosis not present

## 2012-08-01 DIAGNOSIS — B370 Candidal stomatitis: Secondary | ICD-10-CM | POA: Diagnosis not present

## 2012-08-01 DIAGNOSIS — E119 Type 2 diabetes mellitus without complications: Secondary | ICD-10-CM | POA: Diagnosis not present

## 2012-08-02 DIAGNOSIS — L02519 Cutaneous abscess of unspecified hand: Secondary | ICD-10-CM | POA: Diagnosis not present

## 2012-08-02 DIAGNOSIS — L03119 Cellulitis of unspecified part of limb: Secondary | ICD-10-CM | POA: Diagnosis not present

## 2012-08-02 DIAGNOSIS — L03019 Cellulitis of unspecified finger: Secondary | ICD-10-CM | POA: Diagnosis not present

## 2012-08-05 DIAGNOSIS — L02519 Cutaneous abscess of unspecified hand: Secondary | ICD-10-CM | POA: Diagnosis not present

## 2012-08-05 DIAGNOSIS — L03119 Cellulitis of unspecified part of limb: Secondary | ICD-10-CM | POA: Diagnosis not present

## 2012-08-05 DIAGNOSIS — L03019 Cellulitis of unspecified finger: Secondary | ICD-10-CM | POA: Diagnosis not present

## 2012-08-05 LAB — AFB CULTURE WITH SMEAR (NOT AT ARMC)

## 2012-09-21 DIAGNOSIS — E119 Type 2 diabetes mellitus without complications: Secondary | ICD-10-CM | POA: Diagnosis not present

## 2012-09-21 DIAGNOSIS — L03119 Cellulitis of unspecified part of limb: Secondary | ICD-10-CM | POA: Diagnosis not present

## 2012-09-21 DIAGNOSIS — L02519 Cutaneous abscess of unspecified hand: Secondary | ICD-10-CM | POA: Diagnosis not present

## 2012-09-21 DIAGNOSIS — I2510 Atherosclerotic heart disease of native coronary artery without angina pectoris: Secondary | ICD-10-CM | POA: Diagnosis not present

## 2012-09-21 DIAGNOSIS — I10 Essential (primary) hypertension: Secondary | ICD-10-CM | POA: Diagnosis not present

## 2012-09-22 DIAGNOSIS — L03119 Cellulitis of unspecified part of limb: Secondary | ICD-10-CM | POA: Diagnosis not present

## 2012-09-22 DIAGNOSIS — L03019 Cellulitis of unspecified finger: Secondary | ICD-10-CM | POA: Diagnosis not present

## 2012-09-22 DIAGNOSIS — L02519 Cutaneous abscess of unspecified hand: Secondary | ICD-10-CM | POA: Diagnosis not present

## 2012-09-29 DIAGNOSIS — L03019 Cellulitis of unspecified finger: Secondary | ICD-10-CM | POA: Diagnosis not present

## 2012-09-29 DIAGNOSIS — L03119 Cellulitis of unspecified part of limb: Secondary | ICD-10-CM | POA: Diagnosis not present

## 2012-09-29 DIAGNOSIS — L02519 Cutaneous abscess of unspecified hand: Secondary | ICD-10-CM | POA: Diagnosis not present

## 2012-10-06 DIAGNOSIS — L02519 Cutaneous abscess of unspecified hand: Secondary | ICD-10-CM | POA: Diagnosis not present

## 2012-10-06 DIAGNOSIS — L03119 Cellulitis of unspecified part of limb: Secondary | ICD-10-CM | POA: Diagnosis not present

## 2012-10-06 DIAGNOSIS — L03019 Cellulitis of unspecified finger: Secondary | ICD-10-CM | POA: Diagnosis not present

## 2012-10-13 DIAGNOSIS — L02519 Cutaneous abscess of unspecified hand: Secondary | ICD-10-CM | POA: Diagnosis not present

## 2012-10-13 DIAGNOSIS — L03019 Cellulitis of unspecified finger: Secondary | ICD-10-CM | POA: Diagnosis not present

## 2012-10-13 DIAGNOSIS — L03119 Cellulitis of unspecified part of limb: Secondary | ICD-10-CM | POA: Diagnosis not present

## 2012-10-21 DIAGNOSIS — L02519 Cutaneous abscess of unspecified hand: Secondary | ICD-10-CM | POA: Diagnosis not present

## 2012-10-21 DIAGNOSIS — L03119 Cellulitis of unspecified part of limb: Secondary | ICD-10-CM | POA: Diagnosis not present

## 2012-10-21 DIAGNOSIS — L03019 Cellulitis of unspecified finger: Secondary | ICD-10-CM | POA: Diagnosis not present

## 2012-10-28 DIAGNOSIS — L03119 Cellulitis of unspecified part of limb: Secondary | ICD-10-CM | POA: Diagnosis not present

## 2012-10-28 DIAGNOSIS — L03019 Cellulitis of unspecified finger: Secondary | ICD-10-CM | POA: Diagnosis not present

## 2012-10-28 DIAGNOSIS — L02519 Cutaneous abscess of unspecified hand: Secondary | ICD-10-CM | POA: Diagnosis not present

## 2012-11-26 DIAGNOSIS — M6530 Trigger finger, unspecified finger: Secondary | ICD-10-CM | POA: Diagnosis not present

## 2012-12-30 ENCOUNTER — Encounter: Payer: Self-pay | Admitting: Cardiovascular Disease

## 2013-01-10 ENCOUNTER — Encounter: Payer: Self-pay | Admitting: Cardiovascular Disease

## 2013-01-10 DIAGNOSIS — M949 Disorder of cartilage, unspecified: Secondary | ICD-10-CM | POA: Diagnosis not present

## 2013-01-10 DIAGNOSIS — E119 Type 2 diabetes mellitus without complications: Secondary | ICD-10-CM | POA: Diagnosis not present

## 2013-01-10 DIAGNOSIS — I2510 Atherosclerotic heart disease of native coronary artery without angina pectoris: Secondary | ICD-10-CM | POA: Diagnosis not present

## 2013-01-10 DIAGNOSIS — E785 Hyperlipidemia, unspecified: Secondary | ICD-10-CM | POA: Diagnosis not present

## 2013-01-10 DIAGNOSIS — I10 Essential (primary) hypertension: Secondary | ICD-10-CM | POA: Diagnosis not present

## 2013-01-10 DIAGNOSIS — M899 Disorder of bone, unspecified: Secondary | ICD-10-CM | POA: Diagnosis not present

## 2013-01-14 DIAGNOSIS — M6530 Trigger finger, unspecified finger: Secondary | ICD-10-CM | POA: Diagnosis not present

## 2013-01-14 DIAGNOSIS — L02519 Cutaneous abscess of unspecified hand: Secondary | ICD-10-CM | POA: Diagnosis not present

## 2013-01-14 DIAGNOSIS — L03119 Cellulitis of unspecified part of limb: Secondary | ICD-10-CM | POA: Diagnosis not present

## 2013-02-24 DIAGNOSIS — I10 Essential (primary) hypertension: Secondary | ICD-10-CM | POA: Diagnosis not present

## 2013-02-24 DIAGNOSIS — E785 Hyperlipidemia, unspecified: Secondary | ICD-10-CM | POA: Diagnosis not present

## 2013-02-24 DIAGNOSIS — I2510 Atherosclerotic heart disease of native coronary artery without angina pectoris: Secondary | ICD-10-CM | POA: Diagnosis not present

## 2013-02-24 DIAGNOSIS — E119 Type 2 diabetes mellitus without complications: Secondary | ICD-10-CM | POA: Diagnosis not present

## 2013-02-24 DIAGNOSIS — M899 Disorder of bone, unspecified: Secondary | ICD-10-CM | POA: Diagnosis not present

## 2013-02-24 DIAGNOSIS — R82998 Other abnormal findings in urine: Secondary | ICD-10-CM | POA: Diagnosis not present

## 2013-02-25 DIAGNOSIS — M6530 Trigger finger, unspecified finger: Secondary | ICD-10-CM | POA: Diagnosis not present

## 2013-03-03 DIAGNOSIS — I10 Essential (primary) hypertension: Secondary | ICD-10-CM | POA: Diagnosis not present

## 2013-03-03 DIAGNOSIS — M949 Disorder of cartilage, unspecified: Secondary | ICD-10-CM | POA: Diagnosis not present

## 2013-03-03 DIAGNOSIS — Z0000 Encounter for general adult medical examination without abnormal findings: Secondary | ICD-10-CM | POA: Diagnosis not present

## 2013-03-03 DIAGNOSIS — E785 Hyperlipidemia, unspecified: Secondary | ICD-10-CM | POA: Diagnosis not present

## 2013-03-03 DIAGNOSIS — R21 Rash and other nonspecific skin eruption: Secondary | ICD-10-CM | POA: Diagnosis not present

## 2013-03-03 DIAGNOSIS — Z1212 Encounter for screening for malignant neoplasm of rectum: Secondary | ICD-10-CM | POA: Diagnosis not present

## 2013-03-03 DIAGNOSIS — B379 Candidiasis, unspecified: Secondary | ICD-10-CM | POA: Diagnosis not present

## 2013-03-03 DIAGNOSIS — L03119 Cellulitis of unspecified part of limb: Secondary | ICD-10-CM | POA: Diagnosis not present

## 2013-03-03 DIAGNOSIS — I2510 Atherosclerotic heart disease of native coronary artery without angina pectoris: Secondary | ICD-10-CM | POA: Diagnosis not present

## 2013-03-03 DIAGNOSIS — M899 Disorder of bone, unspecified: Secondary | ICD-10-CM | POA: Diagnosis not present

## 2013-03-03 DIAGNOSIS — L02519 Cutaneous abscess of unspecified hand: Secondary | ICD-10-CM | POA: Diagnosis not present

## 2013-05-18 ENCOUNTER — Other Ambulatory Visit: Payer: Self-pay | Admitting: Cardiovascular Disease

## 2013-05-27 DIAGNOSIS — M6530 Trigger finger, unspecified finger: Secondary | ICD-10-CM | POA: Diagnosis not present

## 2013-07-07 DIAGNOSIS — IMO0002 Reserved for concepts with insufficient information to code with codable children: Secondary | ICD-10-CM | POA: Diagnosis not present

## 2013-07-07 DIAGNOSIS — I10 Essential (primary) hypertension: Secondary | ICD-10-CM | POA: Diagnosis not present

## 2013-07-07 DIAGNOSIS — Z23 Encounter for immunization: Secondary | ICD-10-CM | POA: Diagnosis not present

## 2013-07-07 DIAGNOSIS — E1165 Type 2 diabetes mellitus with hyperglycemia: Secondary | ICD-10-CM | POA: Diagnosis not present

## 2013-07-07 DIAGNOSIS — I2510 Atherosclerotic heart disease of native coronary artery without angina pectoris: Secondary | ICD-10-CM | POA: Diagnosis not present

## 2013-07-07 DIAGNOSIS — M899 Disorder of bone, unspecified: Secondary | ICD-10-CM | POA: Diagnosis not present

## 2013-07-07 DIAGNOSIS — Z6827 Body mass index (BMI) 27.0-27.9, adult: Secondary | ICD-10-CM | POA: Diagnosis not present

## 2013-07-07 DIAGNOSIS — E785 Hyperlipidemia, unspecified: Secondary | ICD-10-CM | POA: Diagnosis not present

## 2013-07-07 DIAGNOSIS — Z1331 Encounter for screening for depression: Secondary | ICD-10-CM | POA: Diagnosis not present

## 2013-10-17 DIAGNOSIS — H2510 Age-related nuclear cataract, unspecified eye: Secondary | ICD-10-CM | POA: Diagnosis not present

## 2013-10-17 DIAGNOSIS — H04129 Dry eye syndrome of unspecified lacrimal gland: Secondary | ICD-10-CM | POA: Diagnosis not present

## 2013-10-17 DIAGNOSIS — E119 Type 2 diabetes mellitus without complications: Secondary | ICD-10-CM | POA: Diagnosis not present

## 2013-10-17 DIAGNOSIS — H11159 Pinguecula, unspecified eye: Secondary | ICD-10-CM | POA: Diagnosis not present

## 2013-10-17 DIAGNOSIS — H40019 Open angle with borderline findings, low risk, unspecified eye: Secondary | ICD-10-CM | POA: Diagnosis not present

## 2013-10-17 DIAGNOSIS — H524 Presbyopia: Secondary | ICD-10-CM | POA: Diagnosis not present

## 2013-10-17 DIAGNOSIS — H01009 Unspecified blepharitis unspecified eye, unspecified eyelid: Secondary | ICD-10-CM | POA: Diagnosis not present

## 2013-11-07 DIAGNOSIS — M949 Disorder of cartilage, unspecified: Secondary | ICD-10-CM | POA: Diagnosis not present

## 2013-11-07 DIAGNOSIS — F329 Major depressive disorder, single episode, unspecified: Secondary | ICD-10-CM | POA: Diagnosis not present

## 2013-11-07 DIAGNOSIS — M899 Disorder of bone, unspecified: Secondary | ICD-10-CM | POA: Diagnosis not present

## 2013-11-07 DIAGNOSIS — I252 Old myocardial infarction: Secondary | ICD-10-CM | POA: Diagnosis not present

## 2013-11-07 DIAGNOSIS — E781 Pure hyperglyceridemia: Secondary | ICD-10-CM | POA: Diagnosis not present

## 2013-11-07 DIAGNOSIS — I2510 Atherosclerotic heart disease of native coronary artery without angina pectoris: Secondary | ICD-10-CM | POA: Diagnosis not present

## 2013-11-07 DIAGNOSIS — I10 Essential (primary) hypertension: Secondary | ICD-10-CM | POA: Diagnosis not present

## 2013-11-07 DIAGNOSIS — IMO0001 Reserved for inherently not codable concepts without codable children: Secondary | ICD-10-CM | POA: Diagnosis not present

## 2013-11-07 DIAGNOSIS — E785 Hyperlipidemia, unspecified: Secondary | ICD-10-CM | POA: Diagnosis not present

## 2013-11-07 DIAGNOSIS — F3289 Other specified depressive episodes: Secondary | ICD-10-CM | POA: Diagnosis not present

## 2013-11-09 DIAGNOSIS — H25049 Posterior subcapsular polar age-related cataract, unspecified eye: Secondary | ICD-10-CM | POA: Diagnosis not present

## 2013-11-09 DIAGNOSIS — H04129 Dry eye syndrome of unspecified lacrimal gland: Secondary | ICD-10-CM | POA: Diagnosis not present

## 2013-11-09 DIAGNOSIS — H2510 Age-related nuclear cataract, unspecified eye: Secondary | ICD-10-CM | POA: Diagnosis not present

## 2013-11-09 DIAGNOSIS — H40019 Open angle with borderline findings, low risk, unspecified eye: Secondary | ICD-10-CM | POA: Diagnosis not present

## 2014-02-28 DIAGNOSIS — M899 Disorder of bone, unspecified: Secondary | ICD-10-CM | POA: Diagnosis not present

## 2014-02-28 DIAGNOSIS — I10 Essential (primary) hypertension: Secondary | ICD-10-CM | POA: Diagnosis not present

## 2014-02-28 DIAGNOSIS — IMO0001 Reserved for inherently not codable concepts without codable children: Secondary | ICD-10-CM | POA: Diagnosis not present

## 2014-02-28 DIAGNOSIS — E785 Hyperlipidemia, unspecified: Secondary | ICD-10-CM | POA: Diagnosis not present

## 2014-03-07 DIAGNOSIS — Z23 Encounter for immunization: Secondary | ICD-10-CM | POA: Diagnosis not present

## 2014-03-07 DIAGNOSIS — E785 Hyperlipidemia, unspecified: Secondary | ICD-10-CM | POA: Diagnosis not present

## 2014-03-07 DIAGNOSIS — I252 Old myocardial infarction: Secondary | ICD-10-CM | POA: Diagnosis not present

## 2014-03-07 DIAGNOSIS — E781 Pure hyperglyceridemia: Secondary | ICD-10-CM | POA: Diagnosis not present

## 2014-03-07 DIAGNOSIS — Z1212 Encounter for screening for malignant neoplasm of rectum: Secondary | ICD-10-CM | POA: Diagnosis not present

## 2014-03-07 DIAGNOSIS — I2510 Atherosclerotic heart disease of native coronary artery without angina pectoris: Secondary | ICD-10-CM | POA: Diagnosis not present

## 2014-03-07 DIAGNOSIS — E119 Type 2 diabetes mellitus without complications: Secondary | ICD-10-CM | POA: Diagnosis not present

## 2014-03-07 DIAGNOSIS — Z0000 Encounter for general adult medical examination without abnormal findings: Secondary | ICD-10-CM | POA: Diagnosis not present

## 2014-03-07 DIAGNOSIS — I10 Essential (primary) hypertension: Secondary | ICD-10-CM | POA: Diagnosis not present

## 2014-03-10 ENCOUNTER — Other Ambulatory Visit: Payer: Self-pay | Admitting: Internal Medicine

## 2014-03-10 DIAGNOSIS — Z1231 Encounter for screening mammogram for malignant neoplasm of breast: Secondary | ICD-10-CM

## 2014-04-02 IMAGING — CR DG CHEST 1V PORT
1 series · 1 of 1 positions shown · non-contrast
Comparison: 06/28/2012

CLINICAL DATA: Fever and swelling.

PORTABLE CHEST - 1 VIEW

[view not recorded]
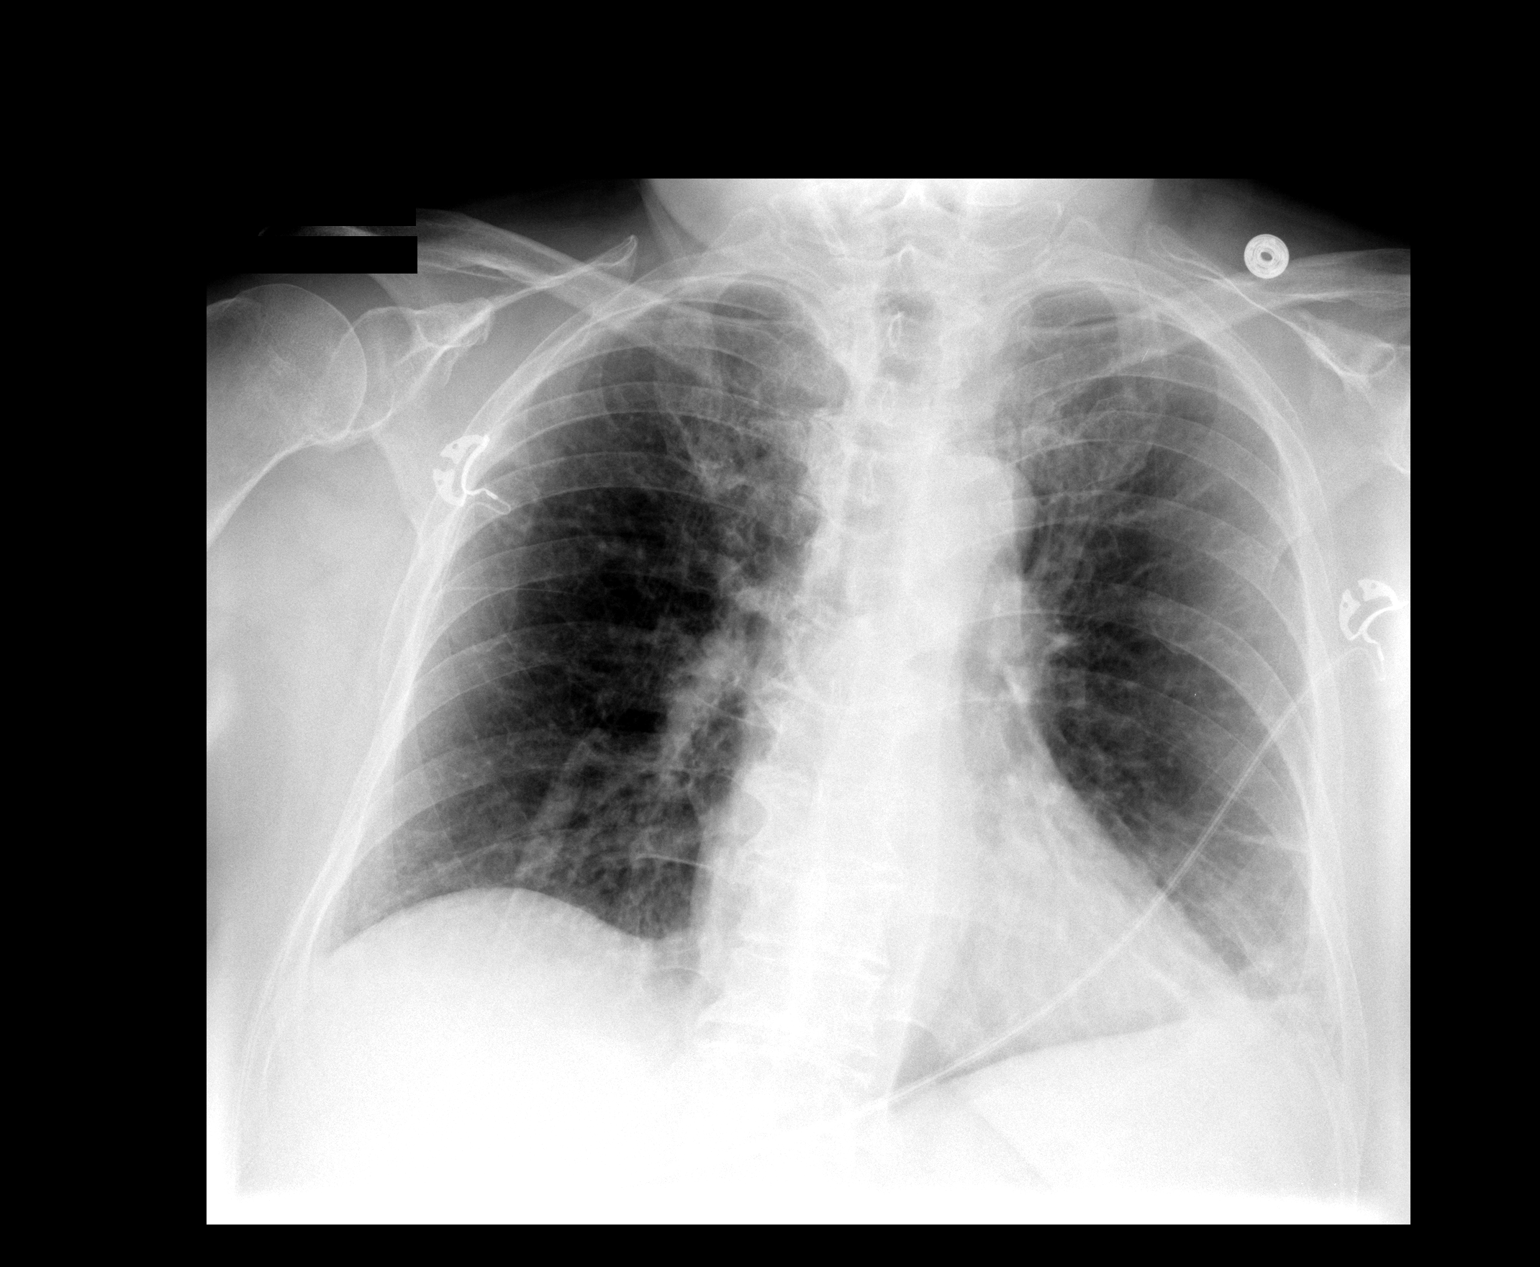

[1 of 1 positions shown; findings below may reference images not displayed]

FINDINGS: There is some increased density at the left lung base
likely representing atelectasis.  Probable underlying chronic lung
disease is stable.  Heart size is normal.  No edema or pleural
fluid.
IMPRESSION: Increased atelectasis at the left lung base.

## 2014-04-04 ENCOUNTER — Encounter (INDEPENDENT_AMBULATORY_CARE_PROVIDER_SITE_OTHER): Payer: Self-pay

## 2014-04-04 ENCOUNTER — Ambulatory Visit
Admission: RE | Admit: 2014-04-04 | Discharge: 2014-04-04 | Disposition: A | Discharge status: Home / Self Care | Payer: Medicare Other | Source: Ambulatory Visit | Attending: Internal Medicine | Admitting: Internal Medicine

## 2014-04-04 DIAGNOSIS — Z1231 Encounter for screening mammogram for malignant neoplasm of breast: Secondary | ICD-10-CM

## 2014-05-18 ENCOUNTER — Other Ambulatory Visit: Payer: Self-pay

## 2014-05-18 MED ORDER — FENOFIBRATE MICRONIZED 200 MG PO CAPS: 200.0000 mg | ORAL_CAPSULE | Freq: Every day | ORAL | Status: DC

## 2014-06-22 DIAGNOSIS — R5383 Other fatigue: Secondary | ICD-10-CM | POA: Diagnosis not present

## 2014-06-22 DIAGNOSIS — I10 Essential (primary) hypertension: Secondary | ICD-10-CM | POA: Diagnosis not present

## 2014-06-22 DIAGNOSIS — R5381 Other malaise: Secondary | ICD-10-CM | POA: Diagnosis not present

## 2014-06-22 DIAGNOSIS — IMO0001 Reserved for inherently not codable concepts without codable children: Secondary | ICD-10-CM | POA: Diagnosis not present

## 2014-06-22 DIAGNOSIS — Z6826 Body mass index (BMI) 26.0-26.9, adult: Secondary | ICD-10-CM | POA: Diagnosis not present

## 2014-06-22 DIAGNOSIS — R0989 Other specified symptoms and signs involving the circulatory and respiratory systems: Secondary | ICD-10-CM | POA: Diagnosis not present

## 2014-06-22 DIAGNOSIS — R0609 Other forms of dyspnea: Secondary | ICD-10-CM | POA: Diagnosis not present

## 2014-06-22 DIAGNOSIS — R809 Proteinuria, unspecified: Secondary | ICD-10-CM | POA: Diagnosis not present

## 2014-07-07 ENCOUNTER — Other Ambulatory Visit (HOSPITAL_COMMUNITY): Payer: Self-pay | Admitting: Internal Medicine

## 2014-07-07 ENCOUNTER — Other Ambulatory Visit: Payer: Self-pay | Admitting: Internal Medicine

## 2014-07-07 ENCOUNTER — Ambulatory Visit (HOSPITAL_COMMUNITY)
Admission: RE | Admit: 2014-07-07 | Discharge: 2014-07-07 | Disposition: A | Discharge status: Home / Self Care | Payer: Medicare Other | Source: Ambulatory Visit | Attending: Surgery | Admitting: Surgery

## 2014-07-07 DIAGNOSIS — I82409 Acute embolism and thrombosis of unspecified deep veins of unspecified lower extremity: Secondary | ICD-10-CM | POA: Diagnosis not present

## 2014-07-07 DIAGNOSIS — R634 Abnormal weight loss: Secondary | ICD-10-CM

## 2014-07-07 DIAGNOSIS — R609 Edema, unspecified: Secondary | ICD-10-CM | POA: Diagnosis not present

## 2014-07-07 DIAGNOSIS — Z1331 Encounter for screening for depression: Secondary | ICD-10-CM | POA: Diagnosis not present

## 2014-07-07 DIAGNOSIS — IMO0001 Reserved for inherently not codable concepts without codable children: Secondary | ICD-10-CM | POA: Diagnosis not present

## 2014-07-07 DIAGNOSIS — E785 Hyperlipidemia, unspecified: Secondary | ICD-10-CM | POA: Diagnosis not present

## 2014-07-07 DIAGNOSIS — I824Z9 Acute embolism and thrombosis of unspecified deep veins of unspecified distal lower extremity: Secondary | ICD-10-CM | POA: Diagnosis not present

## 2014-07-07 DIAGNOSIS — Z6825 Body mass index (BMI) 25.0-25.9, adult: Secondary | ICD-10-CM | POA: Diagnosis not present

## 2014-07-07 DIAGNOSIS — I10 Essential (primary) hypertension: Secondary | ICD-10-CM | POA: Diagnosis not present

## 2014-07-13 ENCOUNTER — Ambulatory Visit
Admission: RE | Admit: 2014-07-13 | Discharge: 2014-07-13 | Disposition: A | Discharge status: Home / Self Care | Payer: Medicare Other | Source: Ambulatory Visit | Attending: Internal Medicine | Admitting: Internal Medicine

## 2014-07-13 DIAGNOSIS — R634 Abnormal weight loss: Secondary | ICD-10-CM

## 2014-07-13 DIAGNOSIS — I2699 Other pulmonary embolism without acute cor pulmonale: Secondary | ICD-10-CM | POA: Diagnosis not present

## 2014-07-13 DIAGNOSIS — K5732 Diverticulitis of large intestine without perforation or abscess without bleeding: Secondary | ICD-10-CM | POA: Diagnosis not present

## 2014-07-13 DIAGNOSIS — K449 Diaphragmatic hernia without obstruction or gangrene: Secondary | ICD-10-CM | POA: Diagnosis not present

## 2014-07-13 MED ORDER — IOHEXOL 300 MG/ML  SOLN
100.0000 mL | Freq: Once | INTRAMUSCULAR | Status: AC | PRN
  Administered 2014-07-13: 100 mL via INTRAVENOUS

## 2014-07-14 ENCOUNTER — Other Ambulatory Visit: Payer: Self-pay | Admitting: Internal Medicine

## 2014-07-14 DIAGNOSIS — N83201 Unspecified ovarian cyst, right side: Secondary | ICD-10-CM

## 2014-07-18 DIAGNOSIS — N83209 Unspecified ovarian cyst, unspecified side: Secondary | ICD-10-CM | POA: Diagnosis not present

## 2014-07-18 DIAGNOSIS — Z0000 Encounter for general adult medical examination without abnormal findings: Secondary | ICD-10-CM | POA: Diagnosis not present

## 2014-07-20 ENCOUNTER — Ambulatory Visit
Admission: RE | Admit: 2014-07-20 | Discharge: 2014-07-20 | Disposition: A | Discharge status: Home / Self Care | Payer: Medicare Other | Source: Ambulatory Visit | Attending: Internal Medicine | Admitting: Internal Medicine

## 2014-07-20 DIAGNOSIS — N83201 Unspecified ovarian cyst, right side: Secondary | ICD-10-CM

## 2014-07-20 DIAGNOSIS — N83209 Unspecified ovarian cyst, unspecified side: Secondary | ICD-10-CM | POA: Diagnosis not present

## 2014-07-25 DIAGNOSIS — R634 Abnormal weight loss: Secondary | ICD-10-CM | POA: Diagnosis not present

## 2014-07-25 DIAGNOSIS — I82409 Acute embolism and thrombosis of unspecified deep veins of unspecified lower extremity: Secondary | ICD-10-CM | POA: Diagnosis not present

## 2014-07-25 DIAGNOSIS — Z6825 Body mass index (BMI) 25.0-25.9, adult: Secondary | ICD-10-CM | POA: Diagnosis not present

## 2014-07-25 DIAGNOSIS — E785 Hyperlipidemia, unspecified: Secondary | ICD-10-CM | POA: Diagnosis not present

## 2014-07-25 DIAGNOSIS — I10 Essential (primary) hypertension: Secondary | ICD-10-CM | POA: Diagnosis not present

## 2014-07-25 DIAGNOSIS — IMO0001 Reserved for inherently not codable concepts without codable children: Secondary | ICD-10-CM | POA: Diagnosis not present

## 2014-07-25 DIAGNOSIS — N83209 Unspecified ovarian cyst, unspecified side: Secondary | ICD-10-CM | POA: Diagnosis not present

## 2014-07-25 DIAGNOSIS — Z23 Encounter for immunization: Secondary | ICD-10-CM | POA: Diagnosis not present

## 2014-08-11 ENCOUNTER — Other Ambulatory Visit: Payer: Self-pay | Admitting: Cardiovascular Disease

## 2014-09-18 ENCOUNTER — Ambulatory Visit (INDEPENDENT_AMBULATORY_CARE_PROVIDER_SITE_OTHER): Payer: Medicare Other | Admitting: Cardiovascular Disease

## 2014-09-18 ENCOUNTER — Other Ambulatory Visit: Payer: Self-pay | Admitting: *Deleted

## 2014-09-18 ENCOUNTER — Encounter: Payer: Self-pay | Admitting: Cardiovascular Disease

## 2014-09-18 VITALS — BP 138/72 | HR 60 | Ht 62.0 in | Wt 148.1 lb

## 2014-09-18 DIAGNOSIS — I82403 Acute embolism and thrombosis of unspecified deep veins of lower extremity, bilateral: Secondary | ICD-10-CM

## 2014-09-18 DIAGNOSIS — I2510 Atherosclerotic heart disease of native coronary artery without angina pectoris: Secondary | ICD-10-CM

## 2014-09-18 DIAGNOSIS — I10 Essential (primary) hypertension: Secondary | ICD-10-CM | POA: Diagnosis not present

## 2014-09-18 DIAGNOSIS — I82409 Acute embolism and thrombosis of unspecified deep veins of unspecified lower extremity: Secondary | ICD-10-CM | POA: Insufficient documentation

## 2014-09-18 MED ORDER — FENOFIBRATE MICRONIZED 200 MG PO CAPS: ORAL_CAPSULE | ORAL | Status: DC

## 2014-09-18 NOTE — Assessment & Plan Note (Signed)
She has done well from a cardiac standpoint.  No angina.  No dyspnea

## 2014-09-18 NOTE — Assessment & Plan Note (Signed)
[name] [name] was found to have bilateral DVT earlier this year. She has swelling in both legs with the left much greater than the right.  She admits to sitting around quite a bit watching television but I really cannot get a history of her sitting for a long enough time to have caused a DVT. She's had a complete workup and was found have any evidence of cancer. She's not had any leg injury.  Since we really do not have a provokable  cause of her DVT, we may need to consider lifelong anticoagulation at this point. On the other hand, Dr. Virgina Jock saw her   around the time of her DVT and he may be satisfied that the DVT was caused by her prolonged sitting and watching television with her husband.     I'm concerned that she may be at risk of recurrent DVT.  The Xarelto is very expensive and she cannot afford it long term. She may have to go on coumadin.    I will see her in 6 months.

## 2014-09-18 NOTE — Progress Notes (Signed)
[name] [name] Date of Birth  [birth year] [place] [address]    [address] [place], [place]  [address]    [name], [name]  [address] [phone]  Fax  [phone]  [phone]  Fax [phone]   History of Present Illness:  Problem list: 1. Coronary artery disease-status post PTCA and stenting of her left complex artery. She status post PTCA and stenting of her right coronary artery in [place] in 2005. 2. Hyperlipidemia 3. Hypertension  [name] [name] is a 68 y.o. female with the above noted hx.  She has had lots of problems with her husband who has bipolar disease ( will not take his meds).  Otherwise she is doing well.   She is still walking some but needs to get back into it  Nov. 16, 2015:  [name] [name] is a 68 y.o. female who I see for hx of CAD - hx of PCI / stenting of her LCX She was found to have a DVT in both legs.  She was seen by Dr.  Virgina [name].   No clear etiology was found   Current Outpatient Prescriptions on File Prior to Visit  Medication Sig Dispense Refill  . Calcium Carbonate-Vitamin D (CALCIUM + D PO) Take 1 tablet by mouth daily.     . fenofibrate micronized (LOFIBRA) 200 MG capsule TAKE ONE CAPSULE BY MOUTH ONCE DAILY BEFORE BREAKFAST 14 capsule 0  . metFORMIN (GLUCOPHAGE) 500 MG tablet Take 500 mg by mouth 2 (two) times daily with a meal.      . metoprolol tartrate (LOPRESSOR) 25 MG tablet Take 12.5 mg by mouth 2 (two) times daily.     . nitroGLYCERIN (NITROSTAT) 0.4 MG SL tablet Place 0.4 mg under the tongue every 5 (five) minutes as needed. For chest pain    . rosuvastatin (CRESTOR) 20 MG tablet Take 10 mg by mouth daily.     No current facility-administered medications on file prior to visit.    Allergies  Allergen Reactions  . Ampicillin Hives, Swelling and Rash  . Avelox [Moxifloxacin Hcl In Nacl] Swelling  . Bactrim [Sulfamethoxazole-Trimethoprim] Hives, Swelling and Rash  . Ibuprofen Other (See  Comments)    unknown  . Penicillins Other (See Comments)    unknown  . Codeine Other (See Comments)    unknown  . Latex Other (See Comments)    unknown    Past Medical History  Diagnosis Date  . Coronary artery disease     s/p stent to LAD and LCx  . Hyperlipidemia   . Hypertension   . Diabetes mellitus   . Myocardial infarction     7 yrs ago    Past Surgical History  Procedure Laterality Date  . Coronary angioplasty with stent placement  04/26/2003    NORMAL. EF 65%  . Cesarean section    . I&d extremity  06/22/2012    Procedure: IRRIGATION AND DEBRIDEMENT EXTREMITY;  Surgeon: Tennis Must, MD;  Location: [place];  Service: Orthopedics;  Laterality: Left;  . I&d extremity  06/29/2012    Procedure: IRRIGATION AND DEBRIDEMENT EXTREMITY;  Surgeon: Tennis Must, MD;  Location: [facility];  Service: Orthopedics;  Laterality: Left;    History  Smoking status  . Never Smoker   Smokeless tobacco  . Not on file    History  Alcohol Use No    Family History  Problem Relation Age of Onset  . Heart attack Mother  X2  . Aneurysm Mother   . Heart attack Father     Reviw of Systems:  Reviewed in the HPI.  All other systems are negative.  Physical Exam: BP 138/72 mmHg  Pulse 60  Ht 5\' 2"  (1.575 m)  Wt 148 lb 1.9 oz (67.187 kg)  BMI 27.08 kg/m2 The patient is alert and oriented x 3.  The mood and affect are normal.   Skin: warm and dry.  Color is normal.    HEENT:   Normocephalic/atraumatic. Normal carotids. No JVD. Because membranes are moist. Neck is supple.  Lungs: Lungs are clear to auscultation.   Heart: Regular rate S1-S2.    Abdomen: Her abdomen is benign. She has good bowel sounds. There is no hepatosplenomegaly.  Extremities:  No clubbing cyanosis .  She has bilateral leg edema - left leg 1-2+,  Right leg trace - 1+   Neuro:  Exam.    ECG: 09/18/2014: Normal sinus rhythm at 60. He has no ST or T wave changes. She has poor R-wave  progression.  Assessment / Plan:

## 2014-09-18 NOTE — Patient Instructions (Signed)
Your physician recommends that you continue on your current medications as directed. Please refer to the Current Medication list given to you today.  Your physician wants you to follow-up in: 6 months with Dr. Nahser.  You will receive a reminder letter in the mail two months in advance. If you don't receive a letter, please call our office to schedule the follow-up appointment.  

## 2014-10-09 DIAGNOSIS — I10 Essential (primary) hypertension: Secondary | ICD-10-CM | POA: Diagnosis not present

## 2014-10-09 DIAGNOSIS — I8290 Acute embolism and thrombosis of unspecified vein: Secondary | ICD-10-CM | POA: Diagnosis not present

## 2014-10-09 DIAGNOSIS — E119 Type 2 diabetes mellitus without complications: Secondary | ICD-10-CM | POA: Diagnosis not present

## 2014-10-09 DIAGNOSIS — H40013 Open angle with borderline findings, low risk, bilateral: Secondary | ICD-10-CM | POA: Diagnosis not present

## 2014-10-09 DIAGNOSIS — Z6826 Body mass index (BMI) 26.0-26.9, adult: Secondary | ICD-10-CM | POA: Diagnosis not present

## 2014-10-09 DIAGNOSIS — H2513 Age-related nuclear cataract, bilateral: Secondary | ICD-10-CM | POA: Diagnosis not present

## 2014-10-09 DIAGNOSIS — R634 Abnormal weight loss: Secondary | ICD-10-CM | POA: Diagnosis not present

## 2014-10-09 DIAGNOSIS — I2510 Atherosclerotic heart disease of native coronary artery without angina pectoris: Secondary | ICD-10-CM | POA: Diagnosis not present

## 2014-10-09 DIAGNOSIS — H25013 Cortical age-related cataract, bilateral: Secondary | ICD-10-CM | POA: Diagnosis not present

## 2014-11-29 DIAGNOSIS — H25013 Cortical age-related cataract, bilateral: Secondary | ICD-10-CM | POA: Diagnosis not present

## 2014-11-29 DIAGNOSIS — H2513 Age-related nuclear cataract, bilateral: Secondary | ICD-10-CM | POA: Diagnosis not present

## 2014-11-29 DIAGNOSIS — E119 Type 2 diabetes mellitus without complications: Secondary | ICD-10-CM | POA: Diagnosis not present

## 2014-11-29 DIAGNOSIS — H25043 Posterior subcapsular polar age-related cataract, bilateral: Secondary | ICD-10-CM | POA: Diagnosis not present

## 2014-11-30 DIAGNOSIS — I2510 Atherosclerotic heart disease of native coronary artery without angina pectoris: Secondary | ICD-10-CM | POA: Diagnosis not present

## 2014-11-30 DIAGNOSIS — E119 Type 2 diabetes mellitus without complications: Secondary | ICD-10-CM | POA: Diagnosis not present

## 2014-11-30 DIAGNOSIS — I10 Essential (primary) hypertension: Secondary | ICD-10-CM | POA: Diagnosis not present

## 2014-11-30 DIAGNOSIS — F329 Major depressive disorder, single episode, unspecified: Secondary | ICD-10-CM | POA: Diagnosis not present

## 2014-11-30 DIAGNOSIS — I8290 Acute embolism and thrombosis of unspecified vein: Secondary | ICD-10-CM | POA: Diagnosis not present

## 2014-11-30 DIAGNOSIS — E785 Hyperlipidemia, unspecified: Secondary | ICD-10-CM | POA: Diagnosis not present

## 2014-12-19 DIAGNOSIS — H2512 Age-related nuclear cataract, left eye: Secondary | ICD-10-CM | POA: Diagnosis not present

## 2015-01-18 DIAGNOSIS — H2511 Age-related nuclear cataract, right eye: Secondary | ICD-10-CM | POA: Diagnosis not present

## 2015-01-18 DIAGNOSIS — H25011 Cortical age-related cataract, right eye: Secondary | ICD-10-CM | POA: Diagnosis not present

## 2015-01-18 DIAGNOSIS — H25041 Posterior subcapsular polar age-related cataract, right eye: Secondary | ICD-10-CM | POA: Diagnosis not present

## 2015-02-06 DIAGNOSIS — H2511 Age-related nuclear cataract, right eye: Secondary | ICD-10-CM | POA: Diagnosis not present

## 2015-03-05 DIAGNOSIS — I252 Old myocardial infarction: Secondary | ICD-10-CM | POA: Diagnosis not present

## 2015-03-05 DIAGNOSIS — E119 Type 2 diabetes mellitus without complications: Secondary | ICD-10-CM | POA: Diagnosis not present

## 2015-03-05 DIAGNOSIS — Z79899 Other long term (current) drug therapy: Secondary | ICD-10-CM | POA: Diagnosis not present

## 2015-03-05 DIAGNOSIS — F3341 Major depressive disorder, recurrent, in partial remission: Secondary | ICD-10-CM | POA: Diagnosis not present

## 2015-03-05 DIAGNOSIS — D649 Anemia, unspecified: Secondary | ICD-10-CM | POA: Diagnosis not present

## 2015-03-05 DIAGNOSIS — E785 Hyperlipidemia, unspecified: Secondary | ICD-10-CM | POA: Diagnosis not present

## 2015-03-05 DIAGNOSIS — I2510 Atherosclerotic heart disease of native coronary artery without angina pectoris: Secondary | ICD-10-CM | POA: Diagnosis not present

## 2015-03-05 DIAGNOSIS — M859 Disorder of bone density and structure, unspecified: Secondary | ICD-10-CM | POA: Diagnosis not present

## 2015-03-05 DIAGNOSIS — Z0000 Encounter for general adult medical examination without abnormal findings: Secondary | ICD-10-CM | POA: Diagnosis not present

## 2015-03-12 ENCOUNTER — Other Ambulatory Visit: Payer: Self-pay | Admitting: Internal Medicine

## 2015-03-12 DIAGNOSIS — M859 Disorder of bone density and structure, unspecified: Secondary | ICD-10-CM | POA: Diagnosis not present

## 2015-03-12 DIAGNOSIS — I119 Hypertensive heart disease without heart failure: Secondary | ICD-10-CM | POA: Diagnosis not present

## 2015-03-12 DIAGNOSIS — Z6827 Body mass index (BMI) 27.0-27.9, adult: Secondary | ICD-10-CM | POA: Diagnosis not present

## 2015-03-12 DIAGNOSIS — K5790 Diverticulosis of intestine, part unspecified, without perforation or abscess without bleeding: Secondary | ICD-10-CM | POA: Diagnosis not present

## 2015-03-12 DIAGNOSIS — N83201 Unspecified ovarian cyst, right side: Secondary | ICD-10-CM

## 2015-03-12 DIAGNOSIS — Z0000 Encounter for general adult medical examination without abnormal findings: Secondary | ICD-10-CM | POA: Diagnosis not present

## 2015-03-12 DIAGNOSIS — N8320 Unspecified ovarian cysts: Secondary | ICD-10-CM | POA: Diagnosis not present

## 2015-03-12 DIAGNOSIS — I8290 Acute embolism and thrombosis of unspecified vein: Secondary | ICD-10-CM | POA: Diagnosis not present

## 2015-03-12 DIAGNOSIS — E1165 Type 2 diabetes mellitus with hyperglycemia: Secondary | ICD-10-CM | POA: Diagnosis not present

## 2015-03-12 DIAGNOSIS — D649 Anemia, unspecified: Secondary | ICD-10-CM | POA: Diagnosis not present

## 2015-03-12 DIAGNOSIS — D72819 Decreased white blood cell count, unspecified: Secondary | ICD-10-CM | POA: Diagnosis not present

## 2015-03-12 DIAGNOSIS — R600 Localized edema: Secondary | ICD-10-CM | POA: Diagnosis not present

## 2015-03-12 DIAGNOSIS — I252 Old myocardial infarction: Secondary | ICD-10-CM | POA: Diagnosis not present

## 2015-03-14 DIAGNOSIS — Z1212 Encounter for screening for malignant neoplasm of rectum: Secondary | ICD-10-CM | POA: Diagnosis not present

## 2015-03-19 ENCOUNTER — Encounter: Payer: Self-pay | Admitting: Cardiovascular Disease

## 2015-03-19 ENCOUNTER — Ambulatory Visit (INDEPENDENT_AMBULATORY_CARE_PROVIDER_SITE_OTHER): Payer: Medicare Other | Admitting: Cardiovascular Disease

## 2015-03-19 VITALS — BP 116/62 | HR 64 | Ht 62.0 in | Wt 157.0 lb

## 2015-03-19 DIAGNOSIS — E785 Hyperlipidemia, unspecified: Secondary | ICD-10-CM | POA: Diagnosis not present

## 2015-03-19 DIAGNOSIS — I2510 Atherosclerotic heart disease of native coronary artery without angina pectoris: Secondary | ICD-10-CM | POA: Diagnosis not present

## 2015-03-19 MED ORDER — FENOFIBRATE MICRONIZED 200 MG PO CAPS: ORAL_CAPSULE | ORAL | Status: DC

## 2015-03-19 NOTE — Patient Instructions (Signed)

## 2015-03-19 NOTE — Progress Notes (Signed)
Cardiology Office Note   Date:  03/19/2015   ID:  [name] [name], DOB 1946/05/28, MRN [id number]  PCP:  Precious Reel, MD  Cardiologist:   Thayer Headings, MD   Chief Complaint  Patient presents with  . Follow-up    cad   1. Coronary artery disease-status post PTCA and stenting of her left complex artery. She status post PTCA and stenting of her right coronary artery in [place] in 2005. 2. Hyperlipidemia 3. Hypertension  [name] [name] is a 69 y.o. female with the above noted hx. She has had lots of problems with her husband who has bipolar disease ( will not take his meds). Otherwise she is doing well. She is still walking some but needs to get back into it  Nov. 16, 2015:  [name] [name] is a 69 y.o. female who I see for hx of CAD - hx of PCI / stenting of her LCX She was found to have a DVT in both legs.  She was seen by Dr. Virgina Jock.  No clear etiology was found    Mar 19, 2015 : [name] [name] is a 69 y.o. female who presents for  CAD   She has done well.  She had DVT in Sept. 2015. Still on xarelto.   Past Medical History  Diagnosis Date  . Coronary artery disease     s/p stent to LAD and LCx  . Hyperlipidemia   . Hypertension   . Diabetes mellitus   . Myocardial infarction     7 yrs ago    Past Surgical History  Procedure Laterality Date  . Coronary angioplasty with stent placement  04/26/2003    NORMAL. EF 65%  . Cesarean section    . I&d extremity  06/22/2012    Procedure: IRRIGATION AND DEBRIDEMENT EXTREMITY;  Surgeon: Tennis Must, MD;  Location: [facility];  Service: Orthopedics;  Laterality: Left;  . I&d extremity  06/29/2012    Procedure: IRRIGATION AND DEBRIDEMENT EXTREMITY;  Surgeon: Tennis Must, MD;  Location: [facility];  Service: Orthopedics;  Laterality: Left;     Current Outpatient Prescriptions  Medication Sig Dispense Refill  . fenofibrate micronized (LOFIBRA) 200 MG capsule TAKE ONE CAPSULE BY MOUTH ONCE DAILY BEFORE BREAKFAST 30 capsule 6  . metFORMIN  (GLUCOPHAGE) 500 MG tablet Take 500 mg by mouth 2 (two) times daily with a meal.      . metoprolol tartrate (LOPRESSOR) 25 MG tablet Take 12.5 mg by mouth 2 (two) times daily.     . nitroGLYCERIN (NITROSTAT) 0.4 MG SL tablet Place 0.4 mg under the tongue every 5 (five) minutes as needed. For chest pain    . rivaroxaban (XARELTO) 20 MG TABS tablet Take 20 mg by mouth daily with supper.    . rosuvastatin (CRESTOR) 20 MG tablet Take 10 mg by mouth daily.     No current facility-administered medications for this visit.    Allergies:   Ampicillin; Avelox; Bactrim; Ibuprofen; Penicillins; Codeine; and Latex    Social History:  The patient  reports that she has never smoked. She does not have any smokeless tobacco history on file. She reports that she does not drink alcohol or use illicit drugs.   Family History:  The patient's family history includes Aneurysm in her mother; Heart attack in her father and mother.    ROS:  Please see the history of present illness.    Review of Systems: Constitutional:  denies fever, chills, diaphoresis, appetite change and fatigue.  HEENT: denies  photophobia, eye pain, redness, hearing loss, ear pain, congestion, sore throat, rhinorrhea, sneezing, neck pain, neck stiffness and tinnitus.  Respiratory: denies SOB, DOE, cough, chest tightness, and wheezing.  Cardiovascular: denies chest pain, palpitations and leg swelling.  Gastrointestinal: denies nausea, vomiting, abdominal pain, diarrhea, constipation, blood in stool.  Genitourinary: denies dysuria, urgency, frequency, hematuria, flank pain and difficulty urinating.  Musculoskeletal: denies  myalgias, back pain, joint swelling, arthralgias and gait problem.   Skin: denies pallor, rash and wound.  Neurological: denies dizziness, seizures, syncope, weakness, light-headedness, numbness and headaches.   Hematological: denies adenopathy, easy bruising, personal or family bleeding history.   Psychiatric/ Behavioral: denies suicidal ideation, mood changes, confusion, nervousness, sleep disturbance and agitation.       All other systems are reviewed and negative.    PHYSICAL EXAM: VS:  BP 116/62 mmHg  Pulse 64  Ht 5\' 2"  (1.575 m)  Wt 71.215 kg (157 lb)  BMI 28.71 kg/m2 , BMI Body mass index is 28.71 kg/(m^2). GEN: Well nourished, well developed, in no acute distress HEENT: normal Neck: no JVD, carotid bruits, or masses Cardiac: RRR; no murmurs, rubs, or gallops,  1-2 + edema  In the left lower leg.  Respiratory:  clear to auscultation bilaterally, normal work of breathing GI: soft, nontender, nondistended, + BS MS: no deformity or atrophy Skin: warm and dry, no rash Neuro:  Strength and sensation are intact Psych: normal   EKG:  EKG is not ordered today.    Recent Labs: No results found for requested labs within last 365 days.    Lipid Panel No results found for: CHOL, TRIG, HDL, CHOLHDL, VLDL, LDLCALC, LDLDIRECT    Wt Readings from Last 3 Encounters:  03/19/15 71.215 kg (157 lb)  09/18/14 67.187 kg (148 lb 1.9 oz)  07/03/12 78 kg (171 lb 15.3 oz)      Other studies Reviewed: Additional studies/ records that were reviewed today include: . Review of the above records demonstrates:    ASSESSMENT AND PLAN:  1. Coronary artery disease-status post PTCA and stenting of her left complex artery. She status post PTCA and stenting of her right coronary artery in [place] in 2005. She's doing very well.  2. Hyperlipidemia- he ran out of her Crestor tablets several weeks ago. She had labs drawn at her medical doctors office when she was off the Crestor. I've encouraged her to take the Crestor every day.   I will see her in 6 months for follow up visit and fasting labs.  3. Hypertension   Current medicines are reviewed at length with the patient today.  The patient has concerns regarding medicines.  The following changes have been made:  no  change  Labs/ tests ordered today include:  No orders of the defined types were placed in this encounter.     Disposition:   FU with me in 6 months with fasting labs.       Nahser, Wonda Cheng, MD  03/19/2015 10:55 AM    [facility] [place], [place], [place]  [address] Phone: [phone]; Fax: [phone]   [facility]  [address] [place], [place]  [address] [phone]    Fax [phone]

## 2015-04-03 ENCOUNTER — Other Ambulatory Visit: Payer: Medicare Other

## 2015-04-16 DIAGNOSIS — Z6827 Body mass index (BMI) 27.0-27.9, adult: Secondary | ICD-10-CM | POA: Diagnosis not present

## 2015-04-16 DIAGNOSIS — J309 Allergic rhinitis, unspecified: Secondary | ICD-10-CM | POA: Diagnosis not present

## 2015-04-16 DIAGNOSIS — E1165 Type 2 diabetes mellitus with hyperglycemia: Secondary | ICD-10-CM | POA: Diagnosis not present

## 2015-04-16 DIAGNOSIS — L237 Allergic contact dermatitis due to plants, except food: Secondary | ICD-10-CM | POA: Diagnosis not present

## 2015-07-13 DIAGNOSIS — E785 Hyperlipidemia, unspecified: Secondary | ICD-10-CM | POA: Diagnosis not present

## 2015-07-13 DIAGNOSIS — F3341 Major depressive disorder, recurrent, in partial remission: Secondary | ICD-10-CM | POA: Diagnosis not present

## 2015-07-13 DIAGNOSIS — I119 Hypertensive heart disease without heart failure: Secondary | ICD-10-CM | POA: Diagnosis not present

## 2015-07-13 DIAGNOSIS — E1165 Type 2 diabetes mellitus with hyperglycemia: Secondary | ICD-10-CM | POA: Diagnosis not present

## 2015-07-13 DIAGNOSIS — I10 Essential (primary) hypertension: Secondary | ICD-10-CM | POA: Diagnosis not present

## 2015-07-13 DIAGNOSIS — I8290 Acute embolism and thrombosis of unspecified vein: Secondary | ICD-10-CM | POA: Diagnosis not present

## 2015-07-13 DIAGNOSIS — M6530 Trigger finger, unspecified finger: Secondary | ICD-10-CM | POA: Diagnosis not present

## 2015-07-13 DIAGNOSIS — I2510 Atherosclerotic heart disease of native coronary artery without angina pectoris: Secondary | ICD-10-CM | POA: Diagnosis not present

## 2015-07-13 DIAGNOSIS — Z23 Encounter for immunization: Secondary | ICD-10-CM | POA: Diagnosis not present

## 2015-07-13 DIAGNOSIS — E663 Overweight: Secondary | ICD-10-CM | POA: Diagnosis not present

## 2015-07-13 DIAGNOSIS — Z6828 Body mass index (BMI) 28.0-28.9, adult: Secondary | ICD-10-CM | POA: Diagnosis not present

## 2015-07-20 DIAGNOSIS — M65341 Trigger finger, right ring finger: Secondary | ICD-10-CM | POA: Diagnosis not present

## 2015-07-27 ENCOUNTER — Other Ambulatory Visit: Payer: Self-pay | Admitting: Internal Medicine

## 2015-07-27 DIAGNOSIS — Z1231 Encounter for screening mammogram for malignant neoplasm of breast: Secondary | ICD-10-CM

## 2015-08-28 ENCOUNTER — Ambulatory Visit
Admission: RE | Admit: 2015-08-28 | Discharge: 2015-08-28 | Disposition: A | Discharge status: Home / Self Care | Payer: Medicare Other | Source: Ambulatory Visit | Attending: Internal Medicine | Admitting: Internal Medicine

## 2015-08-28 DIAGNOSIS — Z1231 Encounter for screening mammogram for malignant neoplasm of breast: Secondary | ICD-10-CM

## 2015-08-31 DIAGNOSIS — M65341 Trigger finger, right ring finger: Secondary | ICD-10-CM | POA: Diagnosis not present

## 2015-09-17 DIAGNOSIS — H40011 Open angle with borderline findings, low risk, right eye: Secondary | ICD-10-CM | POA: Diagnosis not present

## 2015-09-17 DIAGNOSIS — H40012 Open angle with borderline findings, low risk, left eye: Secondary | ICD-10-CM | POA: Diagnosis not present

## 2015-09-18 ENCOUNTER — Encounter: Payer: Self-pay | Admitting: Cardiovascular Disease

## 2015-09-18 ENCOUNTER — Ambulatory Visit (INDEPENDENT_AMBULATORY_CARE_PROVIDER_SITE_OTHER): Payer: Medicare Other | Admitting: Cardiovascular Disease

## 2015-09-18 ENCOUNTER — Other Ambulatory Visit (INDEPENDENT_AMBULATORY_CARE_PROVIDER_SITE_OTHER): Payer: Medicare Other

## 2015-09-18 VITALS — BP 140/80 | HR 74 | Ht 62.0 in | Wt 161.1 lb

## 2015-09-18 DIAGNOSIS — I2510 Atherosclerotic heart disease of native coronary artery without angina pectoris: Secondary | ICD-10-CM

## 2015-09-18 DIAGNOSIS — I10 Essential (primary) hypertension: Secondary | ICD-10-CM | POA: Diagnosis not present

## 2015-09-18 NOTE — Patient Instructions (Signed)
Medication Instructions:  Your physician recommends that you continue on your current medications as directed. Please refer to the Current Medication list given to you today.   Labwork: None Ordered   Testing/Procedures: None Ordered   Follow-Up: Your physician wants you to follow-up in: 6 months with Dr. Nahser.  You will receive a reminder letter in the mail two months in advance. If you don't receive a letter, please call our office to schedule the follow-up appointment.   If you need a refill on your cardiac medications before your next appointment, please call your pharmacy.   Thank you for choosing [facility]! Lynton Crescenzo, RN [phone]    

## 2015-09-18 NOTE — Progress Notes (Signed)
Cardiology Office Note   Date:  09/18/2015   ID:  [name] [name], DOB 1946/07/29, MRN [id number]  PCP:  Precious Reel, MD  Cardiologist:   Thayer Headings, MD   Chief Complaint  Patient presents with  . Follow-up    CAD   1. Coronary artery disease-status post PTCA and stenting of her left complex artery. She status post PTCA and stenting of her right coronary artery in [place] in 2005. 2. Hyperlipidemia 3. Hypertension  [name] [name] is a 69 y.o. female with the above noted hx. She has had lots of problems with her husband who has bipolar disease ( will not take his meds). Otherwise she is doing well. She is still walking some but needs to get back into it  Nov. 16, 2015:  [name] [name] is a 69 y.o. female who I see for hx of CAD - hx of PCI / stenting of her LCX She was found to have a DVT in both legs.  She was seen by Dr. Virgina Jock.  No clear etiology was found    Mar 19, 2015 : [name] [name] is a 69 y.o. female who presents for  CAD   She has done well.  She had DVT in Sept. 2015. Still on xarelto.   Nov. 15, 2016: Doing well.  Has some fatigue and chest tightness toward the end of the day. No CP with exertion.   Husband has been diagnosed with Parkinsons .  Lots of stress.    Past Medical History  Diagnosis Date  . Coronary artery disease     s/p stent to LAD and LCx  . Hyperlipidemia   . Hypertension   . Diabetes mellitus   . Myocardial infarction ([place])     7 yrs ago    Past Surgical History  Procedure Laterality Date  . Coronary angioplasty with stent placement  04/26/2003    NORMAL. EF 65%  . Cesarean section    . I&d extremity  06/22/2012    Procedure: IRRIGATION AND DEBRIDEMENT EXTREMITY;  Surgeon: Tennis Must, MD;  Location: [facility];  Service: Orthopedics;  Laterality: Left;  . I&d extremity  06/29/2012    Procedure: IRRIGATION AND DEBRIDEMENT EXTREMITY;  Surgeon: Tennis Must, MD;  Location: [facility];  Service: Orthopedics;  Laterality: Left;     Current  Outpatient Prescriptions  Medication Sig Dispense Refill  . fenofibrate micronized (LOFIBRA) 200 MG capsule TAKE ONE CAPSULE BY MOUTH ONCE DAILY BEFORE BREAKFAST 30 capsule 11  . metFORMIN (GLUCOPHAGE) 500 MG tablet Take 500 mg by mouth 2 (two) times daily with a meal.      . metoprolol tartrate (LOPRESSOR) 25 MG tablet Take 12.5 mg by mouth 2 (two) times daily.     . nitroGLYCERIN (NITROSTAT) 0.4 MG SL tablet Place 0.4 mg under the tongue every 5 (five) minutes as needed. For chest pain    . rivaroxaban (XARELTO) 20 MG TABS tablet Take 20 mg by mouth daily with supper.    . rosuvastatin (CRESTOR) 20 MG tablet Take 10 mg by mouth daily.     No current facility-administered medications for this visit.    Allergies:   Ampicillin; Avelox; Bactrim; Ibuprofen; Penicillins; Codeine; and Latex    Social History:  The patient  reports that she has never smoked. She does not have any smokeless tobacco history on file. She reports that she does not drink alcohol or use illicit drugs.   Family History:  The patient's family history includes Aneurysm in her  mother; Heart attack in her father and mother.    ROS:  Please see the history of present illness.    Review of Systems: Constitutional:  denies fever, chills, diaphoresis, appetite change and fatigue.  HEENT: denies photophobia, eye pain, redness, hearing loss, ear pain, congestion, sore throat, rhinorrhea, sneezing, neck pain, neck stiffness and tinnitus.  Respiratory: denies SOB, DOE, cough, chest tightness, and wheezing.  Cardiovascular: denies chest pain, palpitations and leg swelling.  Gastrointestinal: denies nausea, vomiting, abdominal pain, diarrhea, constipation, blood in stool.  Genitourinary: denies dysuria, urgency, frequency, hematuria, flank pain and difficulty urinating.  Musculoskeletal: denies  myalgias, back pain, joint swelling, arthralgias and gait problem.   Skin: denies pallor, rash and wound.  Neurological: denies  dizziness, seizures, syncope, weakness, light-headedness, numbness and headaches.   Hematological: denies adenopathy, easy bruising, personal or family bleeding history.  Psychiatric/ Behavioral: denies suicidal ideation, mood changes, confusion, nervousness, sleep disturbance and agitation.       All other systems are reviewed and negative.    PHYSICAL EXAM: VS:  BP 140/80 mmHg  Pulse 74  Ht 5\' 2"  (1.575 m)  Wt 161 lb 1.9 oz (73.084 kg)  BMI 29.46 kg/m2 , BMI Body mass index is 29.46 kg/(m^2). GEN: Well nourished, well developed, in no acute distress HEENT: normal Neck: no JVD, carotid bruits, or masses Cardiac: RRR; no murmurs, rubs, or gallops,  1-2 + edema  In the left lower leg.  ( had a DVT in the left leg)  Respiratory:  clear to auscultation bilaterally, normal work of breathing GI: soft, nontender, nondistended, + BS MS: no deformity or atrophy Skin: warm and dry, no rash Neuro:  Strength and sensation are intact Psych: normal   EKG:  EKG is ordered today.  NSR at 74.  Low voltage     Recent Labs: No results found for requested labs within last 365 days.    Lipid Panel No results found for: CHOL, TRIG, HDL, CHOLHDL, VLDL, LDLCALC, LDLDIRECT    Wt Readings from Last 3 Encounters:  09/18/15 161 lb 1.9 oz (73.084 kg)  03/19/15 157 lb (71.215 kg)  09/18/14 148 lb 1.9 oz (67.187 kg)      Other studies Reviewed: Additional studies/ records that were reviewed today include: . Review of the above records demonstrates:    ASSESSMENT AND PLAN:  1. Coronary artery disease-status post PTCA and stenting of her left complex artery. She status post PTCA and stenting of her right coronary artery in [place] in 2005. She's doing very well.  2. Hyperlipidemia- is on generic Rosuvastatin.     3. Hypertension - BP is well controlled.   Current medicines are reviewed at length with the patient today.  The patient has concerns regarding medicines.  The following  changes have been made:  no change  Labs/ tests ordered today include:  No orders of the defined types were placed in this encounter.     Disposition:   FU with me in 6 months with fasting labs.       Gabrian Hoque, Wonda Cheng, MD  09/18/2015 9:12 AM    [facility] [place], [place], [place]  [address] Phone: [phone]; Fax: [phone]   [facility]  [address] [place], [place]  [address] [phone]    Fax [phone]

## 2015-10-27 DIAGNOSIS — H66001 Acute suppurative otitis media without spontaneous rupture of ear drum, right ear: Secondary | ICD-10-CM | POA: Diagnosis not present

## 2015-11-16 DIAGNOSIS — I10 Essential (primary) hypertension: Secondary | ICD-10-CM | POA: Diagnosis not present

## 2015-11-16 DIAGNOSIS — Z6828 Body mass index (BMI) 28.0-28.9, adult: Secondary | ICD-10-CM | POA: Diagnosis not present

## 2015-11-16 DIAGNOSIS — I8290 Acute embolism and thrombosis of unspecified vein: Secondary | ICD-10-CM | POA: Diagnosis not present

## 2015-11-16 DIAGNOSIS — E1165 Type 2 diabetes mellitus with hyperglycemia: Secondary | ICD-10-CM | POA: Diagnosis not present

## 2015-11-16 DIAGNOSIS — E663 Overweight: Secondary | ICD-10-CM | POA: Diagnosis not present

## 2015-11-16 DIAGNOSIS — Z1389 Encounter for screening for other disorder: Secondary | ICD-10-CM | POA: Diagnosis not present

## 2015-11-16 DIAGNOSIS — I2510 Atherosclerotic heart disease of native coronary artery without angina pectoris: Secondary | ICD-10-CM | POA: Diagnosis not present

## 2015-11-16 DIAGNOSIS — I119 Hypertensive heart disease without heart failure: Secondary | ICD-10-CM | POA: Diagnosis not present

## 2016-03-05 ENCOUNTER — Encounter: Payer: Self-pay | Admitting: Cardiovascular Disease

## 2016-03-05 ENCOUNTER — Ambulatory Visit (INDEPENDENT_AMBULATORY_CARE_PROVIDER_SITE_OTHER): Payer: Medicare Other | Admitting: Cardiovascular Disease

## 2016-03-05 VITALS — BP 142/80 | HR 70 | Ht 62.0 in | Wt 162.1 lb

## 2016-03-05 DIAGNOSIS — I2510 Atherosclerotic heart disease of native coronary artery without angina pectoris: Secondary | ICD-10-CM

## 2016-03-05 DIAGNOSIS — I10 Essential (primary) hypertension: Secondary | ICD-10-CM

## 2016-03-05 MED ORDER — CARVEDILOL 6.25 MG PO TABS: 6.2500 mg | ORAL_TABLET | Freq: Two times a day (BID) | ORAL | Status: DC

## 2016-03-05 NOTE — Progress Notes (Signed)
Cardiology Office Note   Date:  03/05/2016   ID:  [name] [name], DOB 07-May-1946, MRN [id number]  PCP:  Precious Reel, MD  Cardiologist:   Thayer Headings, MD   No chief complaint on file.  1. Coronary artery disease-status post PTCA and stenting of her left complex artery. She status post PTCA and stenting of her right coronary artery in [place] in 2005. 2. Hyperlipidemia 3. Hypertension 4. DVT - on anticoagulation   [name] [name] is a 70 y.o. female with the above noted hx. She has had lots of problems with her husband who has bipolar disease ( will not take his meds). Otherwise she is doing well. She is still walking some but needs to get back into it  Nov. 16, 2015:  [name] [name] is a 70 y.o. female who I see for hx of CAD - hx of PCI / stenting of her LCX She was found to have a DVT in both legs.  She was seen by Dr. Virgina Jock.  No clear etiology was found    Mar 19, 2015 : [name] [name] is a 70 y.o. female who presents for  CAD   She has done well.  She had DVT in Sept. 2015. Still on xarelto.   Nov. 15, 2016: Doing well.  Has some fatigue and chest tightness toward the end of the day. No CP with exertion.   Husband has been diagnosed with Parkinsons .  Lots of stress.   Mar 05, 2016:  [name] [name] is seen today  BP is a bit high.   Is not eating more salt than usual -   Past Medical History  Diagnosis Date  . Coronary artery disease     s/p stent to LAD and LCx  . Hyperlipidemia   . Hypertension   . Diabetes mellitus   . Myocardial infarction ([place])     7 yrs ago    Past Surgical History  Procedure Laterality Date  . Coronary angioplasty with stent placement  04/26/2003    NORMAL. EF 65%  . Cesarean section    . I&d extremity  06/22/2012    Procedure: IRRIGATION AND DEBRIDEMENT EXTREMITY;  Surgeon: Tennis Must, MD;  Location: [facility];  Service: Orthopedics;  Laterality: Left;  . I&d extremity  06/29/2012    Procedure: IRRIGATION AND DEBRIDEMENT EXTREMITY;  Surgeon: Tennis Must, MD;  Location: [facility];  Service: Orthopedics;  Laterality: Left;     Current Outpatient Prescriptions  Medication Sig Dispense Refill  . fenofibrate micronized (LOFIBRA) 200 MG capsule TAKE ONE CAPSULE BY MOUTH ONCE DAILY BEFORE BREAKFAST 30 capsule 11  . Fish Oil-Cholecalciferol (FISH OIL + D3) 1000-1000 MG-UNIT CAPS Take 1 tablet by mouth 3 (three) times daily.    . insulin NPH-regular Human (NOVOLIN 70/30) (70-30) 100 UNIT/ML injection Inject 10 Units into the skin daily with breakfast.    . metFORMIN (GLUCOPHAGE) 500 MG tablet Take 500 mg by mouth 2 (two) times daily with a meal.      . metoprolol tartrate (LOPRESSOR) 25 MG tablet Take 12.5 mg by mouth 2 (two) times daily.     . nitroGLYCERIN (NITROSTAT) 0.4 MG SL tablet Place 0.4 mg under the tongue every 5 (five) minutes as needed. For chest pain    . rivaroxaban (XARELTO) 20 MG TABS tablet Take 20 mg by mouth daily with supper.    . rosuvastatin (CRESTOR) 20 MG tablet Take 10 mg by mouth daily.     No current facility-administered medications for  this visit.    Allergies:   Ampicillin; Avelox; Bactrim; Ibuprofen; Penicillins; Codeine; and Latex    Social History:  The patient  reports that she has never smoked. She does not have any smokeless tobacco history on file. She reports that she does not drink alcohol or use illicit drugs.   Family History:  The patient's family history includes Aneurysm in her mother; Heart attack in her father and mother.    ROS:  Please see the history of present illness.    Review of Systems: Constitutional:  denies fever, chills, diaphoresis, appetite change and fatigue.  HEENT: denies photophobia, eye pain, redness, hearing loss, ear pain, congestion, sore throat, rhinorrhea, sneezing, neck pain, neck stiffness and tinnitus.  Respiratory: denies SOB, DOE, cough, chest tightness, and wheezing.  Cardiovascular: denies chest pain, palpitations and leg swelling.  Gastrointestinal: denies  nausea, vomiting, abdominal pain, diarrhea, constipation, blood in stool.  Genitourinary: denies dysuria, urgency, frequency, hematuria, flank pain and difficulty urinating.  Musculoskeletal: denies  myalgias, back pain, joint swelling, arthralgias and gait problem.   Skin: denies pallor, rash and wound.  Neurological: denies dizziness, seizures, syncope, weakness, light-headedness, numbness and headaches.   Hematological: denies adenopathy, easy bruising, personal or family bleeding history.  Psychiatric/ Behavioral: denies suicidal ideation, mood changes, confusion, nervousness, sleep disturbance and agitation.       All other systems are reviewed and negative.    PHYSICAL EXAM: VS:  BP 142/80 mmHg  Pulse 70  Ht 5\' 2"  (1.575 m)  Wt 162 lb 1.9 oz (73.537 kg)  BMI 29.64 kg/m2 , BMI Body mass index is 29.64 kg/(m^2). GEN: Well nourished, well developed, in no acute distress HEENT: normal Neck: no JVD, carotid bruits, or masses Cardiac: RRR; no murmurs, rubs, or gallops,  1-2 + edema  In the left lower leg.  ( had a DVT in the left leg)  Respiratory:  clear to auscultation bilaterally, normal work of breathing GI: soft, nontender, nondistended, + BS MS: no deformity or atrophy Skin: warm and dry, no rash Neuro:  Strength and sensation are intact Psych: normal   EKG:  EKG is ordered today.  NSR at 74.  Low voltage     Recent Labs: No results found for requested labs within last 365 days.    Lipid Panel No results found for: CHOL, TRIG, HDL, CHOLHDL, VLDL, LDLCALC, LDLDIRECT    Wt Readings from Last 3 Encounters:  03/05/16 162 lb 1.9 oz (73.537 kg)  09/18/15 161 lb 1.9 oz (73.084 kg)  03/19/15 157 lb (71.215 kg)      Other studies Reviewed: Additional studies/ records that were reviewed today include: . Review of the above records demonstrates:    ASSESSMENT AND PLAN:  1. Coronary artery disease-status post PTCA and stenting of her left complex artery. She status  post PTCA and stenting of her right coronary artery in [place] in 2005. She's doing very well.  2. Hyperlipidemia- is on generic Rosuvastatin.  Fenofibrate.   Have asked her to ask Dr. Virgina Jock to manage the fenofibrate prescription as well as the rosuvastatin    3. Hypertension - BP is a bit high.   Will DC metoprolol and try Coreg 6.25 mg BID   4.DVT - on Xarelto   Current medicines are reviewed at length with the patient today.  The patient has concerns regarding medicines.  The following changes have been made:  no change  Labs/ tests ordered today include:  No orders of the defined types were placed  in this encounter.     Disposition:   FU with me in 6 months with fasting labs.       Dulcey Riederer, Wonda Cheng, MD  03/05/2016 11:25 AM    [facility] [place], [place], [place]  [address] Phone: [phone]; Fax: [phone]   [facility]  [address] [place], [place]  [address] [phone]    Fax [phone]

## 2016-03-05 NOTE — Patient Instructions (Addendum)
Medication Instructions:  STOP Metoprolol (Lopressor)  START Carvedilol (Coreg) 6.25 mg twice daily   Labwork: None Ordered   Testing/Procedures: None Ordered   Follow-Up: Your physician wants you to follow-up in: 6 months with Dr. Acie Fredrickson.  You will receive a reminder letter in the mail two months in advance. If you don't receive a letter, please call our office to schedule the follow-up appointment.   If you need a refill on your cardiac medications before your next appointment, please call your pharmacy.   Thank you for choosing [facility]! Christen Bame, RN [phone]

## 2016-03-07 DIAGNOSIS — M859 Disorder of bone density and structure, unspecified: Secondary | ICD-10-CM | POA: Diagnosis not present

## 2016-03-28 DIAGNOSIS — M859 Disorder of bone density and structure, unspecified: Secondary | ICD-10-CM | POA: Diagnosis not present

## 2016-03-28 DIAGNOSIS — E119 Type 2 diabetes mellitus without complications: Secondary | ICD-10-CM | POA: Diagnosis not present

## 2016-03-28 DIAGNOSIS — I10 Essential (primary) hypertension: Secondary | ICD-10-CM | POA: Diagnosis not present

## 2016-03-28 DIAGNOSIS — E784 Other hyperlipidemia: Secondary | ICD-10-CM | POA: Diagnosis not present

## 2016-04-04 ENCOUNTER — Other Ambulatory Visit: Payer: Self-pay | Admitting: Cardiovascular Disease

## 2016-04-04 DIAGNOSIS — I119 Hypertensive heart disease without heart failure: Secondary | ICD-10-CM | POA: Diagnosis not present

## 2016-04-04 DIAGNOSIS — E784 Other hyperlipidemia: Secondary | ICD-10-CM | POA: Diagnosis not present

## 2016-04-04 DIAGNOSIS — Z0000 Encounter for general adult medical examination without abnormal findings: Secondary | ICD-10-CM | POA: Diagnosis not present

## 2016-04-04 DIAGNOSIS — I8290 Acute embolism and thrombosis of unspecified vein: Secondary | ICD-10-CM | POA: Diagnosis not present

## 2016-04-04 DIAGNOSIS — D649 Anemia, unspecified: Secondary | ICD-10-CM | POA: Diagnosis not present

## 2016-04-04 DIAGNOSIS — Z6828 Body mass index (BMI) 28.0-28.9, adult: Secondary | ICD-10-CM | POA: Diagnosis not present

## 2016-04-04 DIAGNOSIS — E1165 Type 2 diabetes mellitus with hyperglycemia: Secondary | ICD-10-CM | POA: Diagnosis not present

## 2016-04-04 DIAGNOSIS — R600 Localized edema: Secondary | ICD-10-CM | POA: Diagnosis not present

## 2016-04-04 DIAGNOSIS — I2510 Atherosclerotic heart disease of native coronary artery without angina pectoris: Secondary | ICD-10-CM | POA: Diagnosis not present

## 2016-04-04 DIAGNOSIS — Z1212 Encounter for screening for malignant neoplasm of rectum: Secondary | ICD-10-CM | POA: Diagnosis not present

## 2016-04-04 DIAGNOSIS — I252 Old myocardial infarction: Secondary | ICD-10-CM | POA: Diagnosis not present

## 2016-07-03 DIAGNOSIS — I119 Hypertensive heart disease without heart failure: Secondary | ICD-10-CM | POA: Diagnosis not present

## 2016-07-03 DIAGNOSIS — F3341 Major depressive disorder, recurrent, in partial remission: Secondary | ICD-10-CM | POA: Diagnosis not present

## 2016-07-03 DIAGNOSIS — I8290 Acute embolism and thrombosis of unspecified vein: Secondary | ICD-10-CM | POA: Diagnosis not present

## 2016-07-03 DIAGNOSIS — Z794 Long term (current) use of insulin: Secondary | ICD-10-CM | POA: Diagnosis not present

## 2016-07-03 DIAGNOSIS — Z23 Encounter for immunization: Secondary | ICD-10-CM | POA: Diagnosis not present

## 2016-07-03 DIAGNOSIS — Z6827 Body mass index (BMI) 27.0-27.9, adult: Secondary | ICD-10-CM | POA: Diagnosis not present

## 2016-07-03 DIAGNOSIS — E1165 Type 2 diabetes mellitus with hyperglycemia: Secondary | ICD-10-CM | POA: Diagnosis not present

## 2016-07-03 DIAGNOSIS — E663 Overweight: Secondary | ICD-10-CM | POA: Diagnosis not present

## 2016-07-03 DIAGNOSIS — I2510 Atherosclerotic heart disease of native coronary artery without angina pectoris: Secondary | ICD-10-CM | POA: Diagnosis not present

## 2016-09-01 ENCOUNTER — Encounter (INDEPENDENT_AMBULATORY_CARE_PROVIDER_SITE_OTHER): Payer: Self-pay

## 2016-09-01 ENCOUNTER — Encounter: Payer: Self-pay | Admitting: Cardiovascular Disease

## 2016-09-01 ENCOUNTER — Ambulatory Visit (INDEPENDENT_AMBULATORY_CARE_PROVIDER_SITE_OTHER): Payer: Medicare Other | Admitting: Cardiovascular Disease

## 2016-09-01 VITALS — BP 134/70 | HR 61 | Ht 62.0 in | Wt 161.1 lb

## 2016-09-01 DIAGNOSIS — I2510 Atherosclerotic heart disease of native coronary artery without angina pectoris: Secondary | ICD-10-CM

## 2016-09-01 DIAGNOSIS — E782 Mixed hyperlipidemia: Secondary | ICD-10-CM | POA: Diagnosis not present

## 2016-09-01 NOTE — Patient Instructions (Signed)
Medication Instructions:  Your physician recommends that you continue on your current medications as directed. Please refer to the Current Medication list given to you today.   Labwork: None Ordered   Testing/Procedures: None Ordered   Follow-Up: Your physician wants you to follow-up in: 6 months with Dr. Nahser.  You will receive a reminder letter in the mail two months in advance. If you don't receive a letter, please call our office to schedule the follow-up appointment.   If you need a refill on your cardiac medications before your next appointment, please call your pharmacy.   Thank you for choosing [facility]! [name], RN [phone]    

## 2016-09-01 NOTE — Progress Notes (Signed)
Cardiology Office Note   Date:  09/01/2016   ID:  [name], DOB 06-May-1946, MRN [id number]  PCP:  Precious Reel, MD  Cardiologist:   Mertie Moores, MD   Chief Complaint  Patient presents with  . Coronary Artery Disease   1. Coronary artery disease-status post PTCA and stenting of her left complex artery. She status post PTCA and stenting of her right coronary artery in [place] in 2005. 2. Hyperlipidemia 3. Hypertension 4. DVT - on anticoagulation   [name] [name] is a 70 y.o. female with the above noted hx. She has had lots of problems with her husband who has bipolar disease ( will not take his meds). Otherwise she is doing well. She is still walking some but needs to get back into it  Nov. 16, 2015:  [name] [name] is a 70 y.o. female who I see for hx of CAD - hx of PCI / stenting of her LCX She was found to have a DVT in both legs.  She was seen by Dr. Virgina Jock.  No clear etiology was found    Mar 19, 2015 : [name] [name] is a 70 y.o. female who presents for  CAD   She has done well.  She had DVT in Sept. 2015. Still on xarelto.   Nov. 15, 2016: Doing well.  Has some fatigue and chest tightness toward the end of the day. No CP with exertion.   Husband has been diagnosed with Parkinsons .  Lots of stress.   Mar 05, 2016:  [name] [name] is seen today  BP is a bit high.   Is not eating more salt than usual -   Oct. 30, 2017:  [name] [name] is seen today  No cardiac issues. No angina  Is frustrated with her husband who has Parkinsons and likely has some degree of dementia. He does not shower regularly    Past Medical History:  Diagnosis Date  . Coronary artery disease    s/p stent to LAD and LCx  . Diabetes mellitus   . Hyperlipidemia   . Hypertension   . Myocardial infarction    7 yrs ago    Past Surgical History:  Procedure Laterality Date  . CESAREAN SECTION    . CORONARY ANGIOPLASTY WITH STENT PLACEMENT  04/26/2003   NORMAL. EF 65%  . I&D EXTREMITY  06/22/2012   Procedure:  IRRIGATION AND DEBRIDEMENT EXTREMITY;  Surgeon: Tennis Must, MD;  Location: [place];  Service: Orthopedics;  Laterality: Left;  . I&D EXTREMITY  06/29/2012   Procedure: IRRIGATION AND DEBRIDEMENT EXTREMITY;  Surgeon: Tennis Must, MD;  Location: [place];  Service: Orthopedics;  Laterality: Left;     Current Outpatient Prescriptions  Medication Sig Dispense Refill  . carvedilol (COREG) 6.25 MG tablet Take 1 tablet (6.25 mg total) by mouth 2 (two) times daily. 60 tablet 11  . fenofibrate micronized (LOFIBRA) 200 MG capsule TAKE ONE CAPSULE BY MOUTH ONCE DAILY BEFORE  BREAKFAST. 30 capsule 11  . Fish Oil-Cholecalciferol (FISH OIL + D3) 1000-1000 MG-UNIT CAPS Take 1 tablet by mouth 3 (three) times daily.    . insulin NPH-regular Human (NOVOLIN 70/30) (70-30) 100 UNIT/ML injection Inject 10 Units into the skin daily with breakfast.    . metFORMIN (GLUCOPHAGE) 500 MG tablet Take 500 mg by mouth 2 (two) times daily with a meal.      . nitroGLYCERIN (NITROSTAT) 0.4 MG SL tablet Place 0.4 mg under the tongue every 5 (five) minutes as needed. For chest pain    .  rivaroxaban (XARELTO) 20 MG TABS tablet Take 20 mg by mouth daily with supper.    . rosuvastatin (CRESTOR) 20 MG tablet Take 10 mg by mouth daily.     No current facility-administered medications for this visit.     Allergies:   Ampicillin; Avelox [moxifloxacin hcl in nacl]; Bactrim [sulfamethoxazole-trimethoprim]; Ibuprofen; Penicillins; Codeine; and Latex    Social History:  The patient  reports that she has never smoked. She does not have any smokeless tobacco history on file. She reports that she does not drink alcohol or use drugs.   Family History:  The patient's family history includes Aneurysm in her mother; Heart attack in her father and mother.    ROS:  Please see the history of present illness.    Review of Systems: Constitutional:  denies fever, chills, diaphoresis, appetite change and fatigue.  HEENT: denies photophobia, eye  pain, redness, hearing loss, ear pain, congestion, sore throat, rhinorrhea, sneezing, neck pain, neck stiffness and tinnitus.  Respiratory: denies SOB, DOE, cough, chest tightness, and wheezing.  Cardiovascular: denies chest pain, palpitations and leg swelling.  Gastrointestinal: denies nausea, vomiting, abdominal pain, diarrhea, constipation, blood in stool.  Genitourinary: denies dysuria, urgency, frequency, hematuria, flank pain and difficulty urinating.  Musculoskeletal: denies  myalgias, back pain, joint swelling, arthralgias and gait problem.   Skin: denies pallor, rash and wound.  Neurological: denies dizziness, seizures, syncope, weakness, light-headedness, numbness and headaches.   Hematological: denies adenopathy, easy bruising, personal or family bleeding history.  Psychiatric/ Behavioral: denies suicidal ideation, mood changes, confusion, nervousness, sleep disturbance and agitation.      All other systems are reviewed and negative.   PHYSICAL EXAM: VS:  BP 134/70 (BP Location: Left Arm, Patient Position: Sitting, Cuff Size: Normal)   Pulse 61   Ht 5\' 2"  (1.575 m)   Wt 161 lb 1.9 oz (73.1 kg)   BMI 29.47 kg/m  , BMI Body mass index is 29.47 kg/m. GEN: Well nourished, well developed, in no acute distress  HEENT: normal  Neck: no JVD, carotid bruits, or masses Cardiac: RRR; no murmurs, rubs, or gallops,  Trace - 1+  edema  In the left lower leg.  ( had a DVT in the left leg)  Respiratory:  clear to auscultation bilaterally, normal work of breathing GI: soft, nontender, nondistended, + BS MS: no deformity or atrophy  Skin: warm and dry, no rash Neuro:  Strength and sensation are intact Psych: normal  EKG:  EKG is ordered today.  NSR at 61 .  Low voltage   Recent Labs: No results found for requested labs within last 8760 hours.    Lipid Panel No results found for: CHOL, TRIG, HDL, CHOLHDL, VLDL, LDLCALC, LDLDIRECT    Wt Readings from Last 3 Encounters:  09/01/16  161 lb 1.9 oz (73.1 kg)  03/05/16 162 lb 1.9 oz (73.5 kg)  09/18/15 161 lb 1.9 oz (73.1 kg)      Other studies Reviewed: Additional studies/ records that were reviewed today include: . Review of the above records demonstrates:    ASSESSMENT AND PLAN:  1. Coronary artery disease-status post PTCA and stenting of her left complex artery. She status post PTCA and stenting of her right coronary artery in [place] in 2005. She's doing very well.  2. Hyperlipidemia- is on generic Rosuvastatin.  Fenofibrate.   Have asked her to ask Dr. Virgina Jock to manage the fenofibrate prescription as well as the rosuvastatin   3. Hypertension -  BP is better.   4.DVT -  on Xarelto  Left leg is more swollen than the R leg.  Recommended leg elevation, compression hose, ambulation   Current medicines are reviewed at length with the patient today.  The patient has concerns regarding medicines.  The following changes have been made:  no change  Labs/ tests ordered today include:  No orders of the defined types were placed in this encounter.    Disposition:   FU with me in 6 months with fasting labs.       Mertie Moores, MD  09/01/2016 9:25 AM    [facility] [place], [place], [place]  [address] Phone: [phone]; Fax: [phone]   [facility]  [address] [place], [place]  [address] [phone]    Fax [phone]

## 2016-10-02 DIAGNOSIS — E1165 Type 2 diabetes mellitus with hyperglycemia: Secondary | ICD-10-CM | POA: Diagnosis not present

## 2016-10-02 DIAGNOSIS — F3341 Major depressive disorder, recurrent, in partial remission: Secondary | ICD-10-CM | POA: Diagnosis not present

## 2016-10-02 DIAGNOSIS — E663 Overweight: Secondary | ICD-10-CM | POA: Diagnosis not present

## 2016-10-02 DIAGNOSIS — I119 Hypertensive heart disease without heart failure: Secondary | ICD-10-CM | POA: Diagnosis not present

## 2016-10-02 DIAGNOSIS — Z794 Long term (current) use of insulin: Secondary | ICD-10-CM | POA: Diagnosis not present

## 2016-10-02 DIAGNOSIS — Z6828 Body mass index (BMI) 28.0-28.9, adult: Secondary | ICD-10-CM | POA: Diagnosis not present

## 2016-12-09 ENCOUNTER — Other Ambulatory Visit: Payer: Self-pay | Admitting: Internal Medicine

## 2016-12-09 DIAGNOSIS — Z1231 Encounter for screening mammogram for malignant neoplasm of breast: Secondary | ICD-10-CM

## 2017-01-01 DIAGNOSIS — I119 Hypertensive heart disease without heart failure: Secondary | ICD-10-CM | POA: Diagnosis not present

## 2017-01-01 DIAGNOSIS — I2510 Atherosclerotic heart disease of native coronary artery without angina pectoris: Secondary | ICD-10-CM | POA: Diagnosis not present

## 2017-01-01 DIAGNOSIS — Z6829 Body mass index (BMI) 29.0-29.9, adult: Secondary | ICD-10-CM | POA: Diagnosis not present

## 2017-01-01 DIAGNOSIS — E663 Overweight: Secondary | ICD-10-CM | POA: Diagnosis not present

## 2017-01-01 DIAGNOSIS — Z794 Long term (current) use of insulin: Secondary | ICD-10-CM | POA: Diagnosis not present

## 2017-01-01 DIAGNOSIS — E1165 Type 2 diabetes mellitus with hyperglycemia: Secondary | ICD-10-CM | POA: Diagnosis not present

## 2017-01-01 DIAGNOSIS — F3341 Major depressive disorder, recurrent, in partial remission: Secondary | ICD-10-CM | POA: Diagnosis not present

## 2017-01-13 ENCOUNTER — Ambulatory Visit
Admission: RE | Admit: 2017-01-13 | Discharge: 2017-01-13 | Disposition: A | Discharge status: Home / Self Care | Payer: Medicare Other | Source: Ambulatory Visit | Attending: Internal Medicine | Admitting: Internal Medicine

## 2017-01-13 DIAGNOSIS — Z1231 Encounter for screening mammogram for malignant neoplasm of breast: Secondary | ICD-10-CM

## 2017-02-11 ENCOUNTER — Encounter: Payer: Self-pay | Admitting: Cardiovascular Disease

## 2017-03-02 ENCOUNTER — Ambulatory Visit (INDEPENDENT_AMBULATORY_CARE_PROVIDER_SITE_OTHER): Payer: Medicare Other | Admitting: Cardiovascular Disease

## 2017-03-02 ENCOUNTER — Encounter: Payer: Self-pay | Admitting: Cardiovascular Disease

## 2017-03-02 ENCOUNTER — Encounter (INDEPENDENT_AMBULATORY_CARE_PROVIDER_SITE_OTHER): Payer: Self-pay

## 2017-03-02 VITALS — BP 126/60 | HR 70 | Ht 62.0 in | Wt 165.0 lb

## 2017-03-02 DIAGNOSIS — I2510 Atherosclerotic heart disease of native coronary artery without angina pectoris: Secondary | ICD-10-CM

## 2017-03-02 DIAGNOSIS — I10 Essential (primary) hypertension: Secondary | ICD-10-CM

## 2017-03-02 NOTE — Patient Instructions (Signed)
Medication Instructions:  Your physician recommends that you continue on your current medications as directed. Please refer to the Current Medication list given to you today.   Labwork: None Ordered   Testing/Procedures: None Ordered   Follow-Up: Your physician wants you to follow-up in: 6 months with Dr. Nahser.  You will receive a reminder letter in the mail two months in advance. If you don't receive a letter, please call our office to schedule the follow-up appointment.   If you need a refill on your cardiac medications before your next appointment, please call your pharmacy.   Thank you for choosing [facility]! Jame Seelig, RN [phone]    

## 2017-03-02 NOTE — Progress Notes (Signed)
Cardiology Office Note   Date:  03/02/2017   ID:  [name] [name], DOB [birth year], MRN [id number]  PCP:  Precious Reel, MD  Cardiologist:   Mertie Moores, MD   Chief Complaint  Patient presents with  . Coronary Artery Disease   1. Coronary artery disease-status post PTCA and stenting of her left complex artery. She status post PTCA and stenting of her right coronary artery in [place] in 2005. 2. Hyperlipidemia 3. Hypertension 4. Bilateral DVT -  2015 , on anticoagulation   [name] [name] is a 71 y.o. female with the above noted hx. She has had lots of problems with her husband who has bipolar disease ( will not take his meds). Otherwise she is doing well. She is still walking some but needs to get back into it  Nov. 16, 2015:  [name] [name] is a 71 y.o. female who I see for hx of CAD - hx of PCI / stenting of her LCX She was found to have a DVT in both legs.  She was seen by Dr. Virgina Jock.  No clear etiology was found    Mar 19, 2015 : [name] [name] is a 71 y.o. female who presents for  CAD   She has done well.  She had DVT in Sept. 2015. Still on xarelto.   Nov. 15, 2016: Doing well.  Has some fatigue and chest tightness toward the end of the day. No CP with exertion.   Husband has been diagnosed with Parkinsons .  Lots of stress.   Mar 05, 2016:  [name] [name] is seen today  BP is a bit high.   Is not eating more salt than usual -   Oct. 30, 2017:  [name] [name] is seen today  No cardiac issues. No angina  Is frustrated with her husband who has Parkinsons and likely has some degree of dementia. He does not shower regularly   March 02, 2017:  [name] [name] is seen today for follow up visit for CAD, HTN,  and hyperlipidemia .  Is doing well .  No dyspnea. Has some fatigue at the end of the day which goes away after she relaxes.   Is not walking much  Able to mow her entire lawn without any problems.    Past Medical History:  Diagnosis Date  . Coronary artery disease    s/p stent to LAD and LCx    . Diabetes mellitus   . Hyperlipidemia   . Hypertension   . Myocardial infarction ([place])    7 yrs ago    Past Surgical History:  Procedure Laterality Date  . CESAREAN SECTION    . CORONARY ANGIOPLASTY WITH STENT PLACEMENT  04/26/2003   NORMAL. EF 65%  . I&D EXTREMITY  06/22/2012   Procedure: IRRIGATION AND DEBRIDEMENT EXTREMITY;  Surgeon: Tennis Must, MD;  Location: [place];  Service: Orthopedics;  Laterality: Left;  . I&D EXTREMITY  06/29/2012   Procedure: IRRIGATION AND DEBRIDEMENT EXTREMITY;  Surgeon: Tennis Must, MD;  Location: [place];  Service: Orthopedics;  Laterality: Left;     Current Outpatient Prescriptions  Medication Sig Dispense Refill  . carvedilol (COREG) 6.25 MG tablet Take 1 tablet (6.25 mg total) by mouth 2 (two) times daily. 60 tablet 11  . fenofibrate micronized (LOFIBRA) 200 MG capsule TAKE ONE CAPSULE BY MOUTH ONCE DAILY BEFORE  BREAKFAST. 30 capsule 11  . Fish Oil-Cholecalciferol (FISH OIL + D3) 1000-1000 MG-UNIT CAPS Take 1 tablet by mouth 3 (three) times daily.    . insulin  NPH-regular Human (NOVOLIN 70/30) (70-30) 100 UNIT/ML injection Inject 10 Units into the skin daily with breakfast.    . metFORMIN (GLUCOPHAGE) 500 MG tablet Take 500 mg by mouth 2 (two) times daily with a meal.      . nitroGLYCERIN (NITROSTAT) 0.4 MG SL tablet Place 0.4 mg under the tongue every 5 (five) minutes as needed. For chest pain    . rivaroxaban (XARELTO) 20 MG TABS tablet Take 20 mg by mouth daily with supper.    . rosuvastatin (CRESTOR) 20 MG tablet Take 10 mg by mouth daily.     No current facility-administered medications for this visit.     Allergies:   Ampicillin; Avelox [moxifloxacin hcl in nacl]; Bactrim [sulfamethoxazole-trimethoprim]; Ibuprofen; Penicillins; Codeine; and Latex    Social History:  The patient  reports that she has never smoked. She has never used smokeless tobacco. She reports that she does not drink alcohol or use drugs.   Family History:  The  patient's family history includes Aneurysm in her mother; Heart attack in her father and mother.    ROS:  Please see the history of present illness.    Review of Systems: Constitutional:  denies fever, chills, diaphoresis, appetite change and fatigue.  HEENT: denies photophobia, eye pain, redness, hearing loss, ear pain, congestion, sore throat, rhinorrhea, sneezing, neck pain, neck stiffness and tinnitus.  Respiratory: denies SOB, DOE, cough, chest tightness, and wheezing.  Cardiovascular: denies chest pain, palpitations and leg swelling.  Gastrointestinal: denies nausea, vomiting, abdominal pain, diarrhea, constipation, blood in stool.  Genitourinary: denies dysuria, urgency, frequency, hematuria, flank pain and difficulty urinating.  Musculoskeletal: denies  myalgias, back pain, joint swelling, arthralgias and gait problem.   Skin: denies pallor, rash and wound.  Neurological: denies dizziness, seizures, syncope, weakness, light-headedness, numbness and headaches.   Hematological: denies adenopathy, easy bruising, personal or family bleeding history.  Psychiatric/ Behavioral: denies suicidal ideation, mood changes, confusion, nervousness, sleep disturbance and agitation.      All other systems are reviewed and negative.   PHYSICAL EXAM: VS:  BP 126/60 (BP Location: Left Arm, Patient Position: Sitting, Cuff Size: Normal)   Pulse 70   Ht 5\' 2"  (1.575 m)   Wt 165 lb (74.8 kg)   SpO2 97%   BMI 30.18 kg/m  , BMI Body mass index is 30.18 kg/m. GEN: Well nourished, well developed, in no acute distress  HEENT: normal  Neck: no JVD, carotid bruits, or masses Cardiac: RRR; no murmurs, rubs, or gallops,  Trace - 1+- 2+   edema  In the left lower leg.  ( had a DVT in the left leg)  Respiratory:  clear to auscultation bilaterally, normal work of breathing GI: soft, nontender, nondistended, + BS MS: no deformity or atrophy  Skin: warm and dry, no rash Neuro:  Strength and sensation are  intact Psych: normal  EKG:  EKG is ordered today.  NSR at 61 .  Low voltage   Recent Labs: No results found for requested labs within last 8760 hours.    Lipid Panel No results found for: CHOL, TRIG, HDL, CHOLHDL, VLDL, LDLCALC, LDLDIRECT    Wt Readings from Last 3 Encounters:  03/02/17 165 lb (74.8 kg)  09/01/16 161 lb 1.9 oz (73.1 kg)  03/05/16 162 lb 1.9 oz (73.5 kg)      Other studies Reviewed: Additional studies/ records that were reviewed today include: . Review of the above records demonstrates:    ASSESSMENT AND PLAN:  1. Coronary artery disease-status post  PTCA and stenting of her left complex artery. She status post PTCA and stenting of her right coronary artery in [place] in 2005. She's doing very well. Able to mow her entire lawn without any problems.   2. Hyperlipidemia- is on generic Rosuvastatin.  Fenofibrate.   Have asked her to ask Dr. Virgina Jock to manage the fenofibrate prescription as well as the rosuvastatin   3. Hypertension -  BP is better.   4.DVT - on Xarelto  Left leg is more swollen than the R leg.  Recommended leg elevation, compression hose, ambulation   Current medicines are reviewed at length with the patient today.  The patient has concerns regarding medicines.  The following changes have been made:  no change  Labs/ tests ordered today include:  No orders of the defined types were placed in this encounter.    Disposition:   FU with me in 6 months with fasting labs.     Mertie Moores, MD  03/02/2017 10:38 AM    [facility] [place], [place], [place]  [address] Phone: [phone]; Fax: [phone]

## 2017-03-23 DIAGNOSIS — L0390 Cellulitis, unspecified: Secondary | ICD-10-CM | POA: Diagnosis not present

## 2017-03-23 DIAGNOSIS — E119 Type 2 diabetes mellitus without complications: Secondary | ICD-10-CM | POA: Diagnosis not present

## 2017-04-02 DIAGNOSIS — N390 Urinary tract infection, site not specified: Secondary | ICD-10-CM | POA: Diagnosis not present

## 2017-04-02 DIAGNOSIS — I10 Essential (primary) hypertension: Secondary | ICD-10-CM | POA: Diagnosis not present

## 2017-04-02 DIAGNOSIS — M859 Disorder of bone density and structure, unspecified: Secondary | ICD-10-CM | POA: Diagnosis not present

## 2017-04-02 DIAGNOSIS — E1165 Type 2 diabetes mellitus with hyperglycemia: Secondary | ICD-10-CM | POA: Diagnosis not present

## 2017-04-02 DIAGNOSIS — E784 Other hyperlipidemia: Secondary | ICD-10-CM | POA: Diagnosis not present

## 2017-04-09 DIAGNOSIS — Z1389 Encounter for screening for other disorder: Secondary | ICD-10-CM | POA: Diagnosis not present

## 2017-04-09 DIAGNOSIS — I119 Hypertensive heart disease without heart failure: Secondary | ICD-10-CM | POA: Diagnosis not present

## 2017-04-09 DIAGNOSIS — I2510 Atherosclerotic heart disease of native coronary artery without angina pectoris: Secondary | ICD-10-CM | POA: Diagnosis not present

## 2017-04-09 DIAGNOSIS — Z0000 Encounter for general adult medical examination without abnormal findings: Secondary | ICD-10-CM | POA: Diagnosis not present

## 2017-04-09 DIAGNOSIS — E784 Other hyperlipidemia: Secondary | ICD-10-CM | POA: Diagnosis not present

## 2017-04-09 DIAGNOSIS — R21 Rash and other nonspecific skin eruption: Secondary | ICD-10-CM | POA: Diagnosis not present

## 2017-04-09 DIAGNOSIS — I252 Old myocardial infarction: Secondary | ICD-10-CM | POA: Diagnosis not present

## 2017-04-09 DIAGNOSIS — Z6830 Body mass index (BMI) 30.0-30.9, adult: Secondary | ICD-10-CM | POA: Diagnosis not present

## 2017-04-09 DIAGNOSIS — Z794 Long term (current) use of insulin: Secondary | ICD-10-CM | POA: Diagnosis not present

## 2017-04-09 DIAGNOSIS — E1165 Type 2 diabetes mellitus with hyperglycemia: Secondary | ICD-10-CM | POA: Diagnosis not present

## 2017-04-09 DIAGNOSIS — E781 Pure hyperglyceridemia: Secondary | ICD-10-CM | POA: Diagnosis not present

## 2017-04-24 DIAGNOSIS — E784 Other hyperlipidemia: Secondary | ICD-10-CM | POA: Diagnosis not present

## 2017-04-24 DIAGNOSIS — D649 Anemia, unspecified: Secondary | ICD-10-CM | POA: Diagnosis not present

## 2017-05-05 DIAGNOSIS — H40013 Open angle with borderline findings, low risk, bilateral: Secondary | ICD-10-CM | POA: Diagnosis not present

## 2017-05-05 DIAGNOSIS — E119 Type 2 diabetes mellitus without complications: Secondary | ICD-10-CM | POA: Diagnosis not present

## 2017-05-05 DIAGNOSIS — H26492 Other secondary cataract, left eye: Secondary | ICD-10-CM | POA: Diagnosis not present

## 2017-05-05 DIAGNOSIS — H26491 Other secondary cataract, right eye: Secondary | ICD-10-CM | POA: Diagnosis not present

## 2017-05-12 ENCOUNTER — Encounter: Payer: Self-pay | Admitting: Gastroenterology

## 2017-05-15 ENCOUNTER — Other Ambulatory Visit: Payer: Self-pay | Admitting: Cardiovascular Disease

## 2017-06-03 DIAGNOSIS — Z6829 Body mass index (BMI) 29.0-29.9, adult: Secondary | ICD-10-CM | POA: Diagnosis not present

## 2017-06-03 DIAGNOSIS — I8290 Acute embolism and thrombosis of unspecified vein: Secondary | ICD-10-CM | POA: Diagnosis not present

## 2017-06-26 ENCOUNTER — Ambulatory Visit (INDEPENDENT_AMBULATORY_CARE_PROVIDER_SITE_OTHER): Payer: Medicare Other | Admitting: Gastroenterology

## 2017-06-26 ENCOUNTER — Other Ambulatory Visit (INDEPENDENT_AMBULATORY_CARE_PROVIDER_SITE_OTHER): Payer: Medicare Other

## 2017-06-26 ENCOUNTER — Encounter: Payer: Self-pay | Admitting: Gastroenterology

## 2017-06-26 VITALS — BP 110/58 | HR 74 | Ht 62.0 in | Wt 163.0 lb

## 2017-06-26 DIAGNOSIS — Z7901 Long term (current) use of anticoagulants: Secondary | ICD-10-CM | POA: Diagnosis not present

## 2017-06-26 DIAGNOSIS — I2510 Atherosclerotic heart disease of native coronary artery without angina pectoris: Secondary | ICD-10-CM

## 2017-06-26 DIAGNOSIS — D649 Anemia, unspecified: Secondary | ICD-10-CM | POA: Diagnosis not present

## 2017-06-26 LAB — CBC WITH DIFFERENTIAL/PLATELET
Basophils Absolute: 0.1 10*3/uL (ref 0.0–0.1)
Basophils Relative: 1.8 % (ref 0.0–3.0)
EOS PCT: 1.3 % (ref 0.0–5.0)
Eosinophils Absolute: 0.1 10*3/uL (ref 0.0–0.7)
HCT: 31.2 % — ABNORMAL LOW (ref 36.0–46.0)
Hemoglobin: 10.4 g/dL — ABNORMAL LOW (ref 12.0–15.0)
LYMPHS ABS: 1.2 10*3/uL (ref 0.7–4.0)
Lymphocytes Relative: 18 % (ref 12.0–46.0)
MCHC: 33.3 g/dL (ref 30.0–36.0)
MCV: 92.5 fl (ref 78.0–100.0)
MONOS PCT: 4.2 % (ref 3.0–12.0)
Monocytes Absolute: 0.3 10*3/uL (ref 0.1–1.0)
NEUTROS ABS: 5.1 10*3/uL (ref 1.4–7.7)
NEUTROS PCT: 74.7 % (ref 43.0–77.0)
Platelets: 846 10*3/uL — ABNORMAL HIGH (ref 150.0–400.0)
RBC: 3.37 Mil/uL — ABNORMAL LOW (ref 3.87–5.11)
RDW: 23.6 % — ABNORMAL HIGH (ref 11.5–15.5)
WBC: 6.8 10*3/uL (ref 4.0–10.5)

## 2017-06-26 LAB — FERRITIN: Ferritin: 155.9 ng/mL (ref 10.0–291.0)

## 2017-06-26 LAB — IBC PANEL
Iron: 48 ug/dL (ref 42–145)
Saturation Ratios: 12.2 % — ABNORMAL LOW (ref 20.0–50.0)
Transferrin: 280 mg/dL (ref 212.0–360.0)

## 2017-06-26 LAB — VITAMIN B12: VITAMIN B 12: 384 pg/mL (ref 211–911)

## 2017-06-26 LAB — FOLATE: FOLATE: 11.4 ng/mL (ref >5.9)

## 2017-06-26 NOTE — Patient Instructions (Signed)
If you are age 71 or older, your body mass index should be between 23-30. Your Body mass index is 29.81 kg/m. If this is out of the aforementioned range listed, please consider follow up with your Primary Care Provider.  If you are age 26 or younger, your body mass index should be between 19-25. Your Body mass index is 29.81 kg/m. If this is out of the aformentioned range listed, please consider follow up with your Primary Care Provider.   Thank you for choosing [facility]  Dr Wilfrid Lund III

## 2017-06-26 NOTE — Progress Notes (Signed)
[facility] Consult Note:  History: [name] [name] 06/26/2017  Referring physician: [name], MD  Reason for consult/chief complaint: Anemia (diagosed by pcp; never had a colonoscopy; pt has no significant GI complaints)   Subjective  HPI:  This is a 71 year old woman referred by Dr. Virgina Jock primary care for anemia. [name] [name] has never previously had a colonoscopy, she has apparently declined to do so in the past. At a routine visit she was found to have a normocytic anemia with a high RDW and elevated platelets. She received a call from the nurse at Dr. Keane Police office saying that she was iron deficient and needed to see GI for workup of the anemia. He does not seem to have had iron, P29 or folic acid levels checked. Neither she nor her daughter recall having further labs done after the June 12 office visit will but she has taken iron twice a week for the last several weeks. She denies altered bowel habits, chronic abdominal pain, rectal bleeding, black tarry stool or any upper digestive symptoms such as nausea, vomiting, early satiety, dysphagia or weight loss. [name] [name] has been on Xarelto for at least several years for DVT. It is not clear if she has had a hypercoagulable workup but she understands that she will be on lifelong anticoagulation.  ROS:  Review of Systems  Constitutional: Negative for appetite change and unexpected weight change.  HENT: Negative for mouth sores and voice change.   Eyes: Negative for pain and redness.  Respiratory: Negative for cough and shortness of breath.   Cardiovascular: Negative for chest pain and palpitations.  Genitourinary: Negative for dysuria and hematuria.  Musculoskeletal: Negative for arthralgias and myalgias.  Skin: Negative for pallor and rash.  Neurological: Negative for weakness and headaches.  Hematological: Negative for adenopathy.     Past Medical History: Past Medical History:  Diagnosis Date  . Anemia     . Coronary artery disease    s/p stent to LAD and LCx  . DM II (diabetes mellitus, type II), controlled ([place])   . DVT (deep venous thrombosis) ([place])   . Elevated LFTs   . Hyperlipidemia   . Hypertension   . Myocardial infarction ([place])    7 yrs ago     Past Surgical History: Past Surgical History:  Procedure Laterality Date  . CATARACT EXTRACTION    . CESAREAN SECTION  1983  . CORONARY ANGIOPLASTY WITH STENT PLACEMENT  04/26/2003   NORMAL. EF 65%  . I&D EXTREMITY  06/22/2012   Procedure: IRRIGATION AND DEBRIDEMENT EXTREMITY;  Surgeon: Tennis Must, MD;  Location: [place];  Service: Orthopedics;  Laterality: Left;  . I&D EXTREMITY  06/29/2012   Procedure: IRRIGATION AND DEBRIDEMENT EXTREMITY;  Surgeon: Tennis Must, MD;  Location: [place] [facility];  Service: Orthopedics;  Laterality: Left;     Family History: Family History  Problem Relation Age of Onset  . Heart attack Mother        X2  . Aneurysm Mother        AAA  . Stroke Mother   . Heart disease Mother   . Heart attack Father   . Heart disease Father   . Cirrhosis Father   . Osteoarthritis Sister   . Dementia Sister     Social History: Social History   Social History  . Marital status: Married    Spouse name: N/A  . Number of children: N/A  . Years of education: N/A   Social History Main Topics  .  Smoking status: Never Smoker  . Smokeless tobacco: Never Used  . Alcohol use Yes     Comment: social   . Drug use: No  . Sexual activity: Not Asked   Other Topics Concern  . None   Social History Narrative  . None    Allergies: Allergies  Allergen Reactions  . Ampicillin Hives, Swelling and Rash  . Avelox [Moxifloxacin Hcl In Nacl] Swelling  . Bactrim [Sulfamethoxazole-Trimethoprim] Hives, Swelling and Rash  . Ibuprofen Other (See Comments)    unknown  . Penicillins Other (See Comments)    unknown  . Codeine Other (See Comments)    unknown  . Latex Other (See Comments)    unknown    Outpatient  Meds: Current Outpatient Prescriptions  Medication Sig Dispense Refill  . fenofibrate micronized (LOFIBRA) 200 MG capsule TAKE ONE CAPSULE BY MOUTH ONCE DAILY BEFORE BREAKFAST 90 capsule 2  . Fish Oil-Cholecalciferol (FISH OIL + D3) 1000-1000 MG-UNIT CAPS Take 1 tablet by mouth 3 (three) times daily.    . insulin NPH-regular Human (NOVOLIN 70/30) (70-30) 100 UNIT/ML injection Inject 10 Units into the skin daily with breakfast.    . metFORMIN (GLUCOPHAGE) 500 MG tablet Take 500 mg by mouth 2 (two) times daily with a meal.      . nitroGLYCERIN (NITROSTAT) 0.4 MG SL tablet Place 0.4 mg under the tongue every 5 (five) minutes as needed. For chest pain    . rivaroxaban (XARELTO) 20 MG TABS tablet Take 20 mg by mouth daily with supper.    . rosuvastatin (CRESTOR) 20 MG tablet Take 10 mg by mouth daily.     No current facility-administered medications for this visit.     She says she never takes the nitroglycerin because she does not get angina ___________________________________________________________________ Objective   Exam:  BP (!) 110/58   Pulse 74   Ht 5\' 2"  (1.575 m)   Wt 163 lb (73.9 kg)   BMI 29.81 kg/m  Her daughter, a [employer], is with her for the entire visit today.   General: this is a(n) well-appearing woman with a guarded affect   Eyes: sclera anicteric, no redness  ENT: oral mucosa moist without lesions, no cervical or supraclavicular lymphadenopathy, good dentition  CV: RRR with a soft systolic murmur, J6/B3, no JVD, no peripheral edema  Resp: clear to auscultation bilaterally, normal RR and effort noted  GI: soft, no tenderness, with active bowel sounds. No guarding or palpable organomegaly noted.  Skin; warm and dry, no rash or jaundice noted  Neuro: awake, alert and oriented x 3. Normal gross motor function and fluent speech  Labs:  04/14/2017: Creatinine 0.8 AST 48, ALT 48 WBC 5.6, hemoglobin 10.4, MCV 95, RDW elevated at 19, platelets  elevated at 786   Assessment: Encounter Diagnoses  Name Primary?  . Normocytic anemia   . Chronic anticoagulation Yes    It is not clear if she is truly iron deficient, but her elevated platelets could be seen with iron deficiency. She seems disinclined to have any endoscopic workup. I felt that she needed at least a colonoscopy, and if she truly is iron deficient then an EGD as needed as well.  Plan:  Today she was only agreeable to some lab work, so I checked a CBC, iron levels, A19 and folic acid. We will contact her with the results, make further recommendations and see what she decides to do.  Thank you for the courtesy of this consult.  Please call  me with any questions or concerns.  Nelida Meuse III  CC: [name], MD

## 2017-08-18 DIAGNOSIS — Z794 Long term (current) use of insulin: Secondary | ICD-10-CM | POA: Diagnosis not present

## 2017-08-18 DIAGNOSIS — D649 Anemia, unspecified: Secondary | ICD-10-CM | POA: Diagnosis not present

## 2017-08-18 DIAGNOSIS — Z23 Encounter for immunization: Secondary | ICD-10-CM | POA: Diagnosis not present

## 2017-08-18 DIAGNOSIS — I2510 Atherosclerotic heart disease of native coronary artery without angina pectoris: Secondary | ICD-10-CM | POA: Diagnosis not present

## 2017-08-18 DIAGNOSIS — E1165 Type 2 diabetes mellitus with hyperglycemia: Secondary | ICD-10-CM | POA: Diagnosis not present

## 2017-08-18 DIAGNOSIS — R7989 Other specified abnormal findings of blood chemistry: Secondary | ICD-10-CM | POA: Diagnosis not present

## 2017-08-18 DIAGNOSIS — F3341 Major depressive disorder, recurrent, in partial remission: Secondary | ICD-10-CM | POA: Diagnosis not present

## 2017-08-18 DIAGNOSIS — I119 Hypertensive heart disease without heart failure: Secondary | ICD-10-CM | POA: Diagnosis not present

## 2017-08-18 DIAGNOSIS — E663 Overweight: Secondary | ICD-10-CM | POA: Diagnosis not present

## 2017-08-18 DIAGNOSIS — Z6829 Body mass index (BMI) 29.0-29.9, adult: Secondary | ICD-10-CM | POA: Diagnosis not present

## 2017-11-10 DIAGNOSIS — H40013 Open angle with borderline findings, low risk, bilateral: Secondary | ICD-10-CM | POA: Diagnosis not present

## 2017-12-08 DIAGNOSIS — Z6828 Body mass index (BMI) 28.0-28.9, adult: Secondary | ICD-10-CM | POA: Diagnosis not present

## 2017-12-08 DIAGNOSIS — E663 Overweight: Secondary | ICD-10-CM | POA: Diagnosis not present

## 2017-12-08 DIAGNOSIS — M79604 Pain in right leg: Secondary | ICD-10-CM | POA: Diagnosis not present

## 2017-12-08 DIAGNOSIS — F3341 Major depressive disorder, recurrent, in partial remission: Secondary | ICD-10-CM | POA: Diagnosis not present

## 2017-12-08 DIAGNOSIS — Z794 Long term (current) use of insulin: Secondary | ICD-10-CM | POA: Diagnosis not present

## 2017-12-08 DIAGNOSIS — I119 Hypertensive heart disease without heart failure: Secondary | ICD-10-CM | POA: Diagnosis not present

## 2017-12-08 DIAGNOSIS — E1165 Type 2 diabetes mellitus with hyperglycemia: Secondary | ICD-10-CM | POA: Diagnosis not present

## 2017-12-08 DIAGNOSIS — I8290 Acute embolism and thrombosis of unspecified vein: Secondary | ICD-10-CM | POA: Diagnosis not present

## 2018-01-29 DIAGNOSIS — S52511A Displaced fracture of right radial styloid process, initial encounter for closed fracture: Secondary | ICD-10-CM | POA: Diagnosis not present

## 2018-01-29 DIAGNOSIS — M25531 Pain in right wrist: Secondary | ICD-10-CM | POA: Diagnosis not present

## 2018-01-29 DIAGNOSIS — S52571A Other intraarticular fracture of lower end of right radius, initial encounter for closed fracture: Secondary | ICD-10-CM | POA: Diagnosis not present

## 2018-01-29 DIAGNOSIS — H9201 Otalgia, right ear: Secondary | ICD-10-CM | POA: Diagnosis not present

## 2018-01-29 DIAGNOSIS — Z6829 Body mass index (BMI) 29.0-29.9, adult: Secondary | ICD-10-CM | POA: Diagnosis not present

## 2018-01-29 DIAGNOSIS — S52591A Other fractures of lower end of right radius, initial encounter for closed fracture: Secondary | ICD-10-CM | POA: Diagnosis not present

## 2018-02-02 ENCOUNTER — Other Ambulatory Visit: Payer: Self-pay | Admitting: Orthopedic Surgery

## 2018-02-02 DIAGNOSIS — S52591A Other fractures of lower end of right radius, initial encounter for closed fracture: Secondary | ICD-10-CM | POA: Diagnosis not present

## 2018-02-02 DIAGNOSIS — S52571D Other intraarticular fracture of lower end of right radius, subsequent encounter for closed fracture with routine healing: Secondary | ICD-10-CM | POA: Diagnosis not present

## 2018-02-02 DIAGNOSIS — S82434A Nondisplaced oblique fracture of shaft of right fibula, initial encounter for closed fracture: Secondary | ICD-10-CM | POA: Diagnosis not present

## 2018-02-02 DIAGNOSIS — S82424A Nondisplaced transverse fracture of shaft of right fibula, initial encounter for closed fracture: Secondary | ICD-10-CM | POA: Diagnosis not present

## 2018-02-02 DIAGNOSIS — Z9181 History of falling: Secondary | ICD-10-CM | POA: Diagnosis not present

## 2018-02-03 ENCOUNTER — Encounter (HOSPITAL_BASED_OUTPATIENT_CLINIC_OR_DEPARTMENT_OTHER): Payer: Self-pay | Admitting: *Deleted

## 2018-02-03 ENCOUNTER — Encounter (HOSPITAL_BASED_OUTPATIENT_CLINIC_OR_DEPARTMENT_OTHER)
Admission: RE | Admit: 2018-02-03 | Discharge: 2018-02-03 | Disposition: A | Discharge status: Home / Self Care | Payer: Medicare Other | Source: Ambulatory Visit | Attending: Orthopedic Surgery | Admitting: Orthopedic Surgery

## 2018-02-03 ENCOUNTER — Telehealth: Payer: Self-pay | Admitting: *Deleted

## 2018-02-03 ENCOUNTER — Other Ambulatory Visit: Payer: Self-pay

## 2018-02-03 DIAGNOSIS — E119 Type 2 diabetes mellitus without complications: Secondary | ICD-10-CM | POA: Diagnosis not present

## 2018-02-03 DIAGNOSIS — Z86718 Personal history of other venous thrombosis and embolism: Secondary | ICD-10-CM | POA: Diagnosis not present

## 2018-02-03 DIAGNOSIS — I10 Essential (primary) hypertension: Secondary | ICD-10-CM | POA: Diagnosis not present

## 2018-02-03 DIAGNOSIS — I2510 Atherosclerotic heart disease of native coronary artery without angina pectoris: Secondary | ICD-10-CM | POA: Diagnosis not present

## 2018-02-03 DIAGNOSIS — S52571A Other intraarticular fracture of lower end of right radius, initial encounter for closed fracture: Secondary | ICD-10-CM | POA: Diagnosis not present

## 2018-02-03 DIAGNOSIS — I252 Old myocardial infarction: Secondary | ICD-10-CM | POA: Diagnosis not present

## 2018-02-03 DIAGNOSIS — S52501A Unspecified fracture of the lower end of right radius, initial encounter for closed fracture: Secondary | ICD-10-CM | POA: Diagnosis present

## 2018-02-03 DIAGNOSIS — W19XXXA Unspecified fall, initial encounter: Secondary | ICD-10-CM | POA: Diagnosis not present

## 2018-02-03 DIAGNOSIS — Z955 Presence of coronary angioplasty implant and graft: Secondary | ICD-10-CM | POA: Diagnosis not present

## 2018-02-03 DIAGNOSIS — Z794 Long term (current) use of insulin: Secondary | ICD-10-CM | POA: Diagnosis not present

## 2018-02-03 LAB — BASIC METABOLIC PANEL
Anion gap: 10 (ref 5–15)
BUN: 17 mg/dL (ref 6–20)
CHLORIDE: 106 mmol/L (ref 101–111)
CO2: 24 mmol/L (ref 22–32)
CREATININE: 1 mg/dL (ref 0.44–1.00)
Calcium: 9.2 mg/dL (ref 8.9–10.3)
GFR calc non Af Amer: 55 mL/min — ABNORMAL LOW (ref >60)
Glucose, Bld: 106 mg/dL — ABNORMAL HIGH (ref 65–99)
POTASSIUM: 4.6 mmol/L (ref 3.5–5.1)
Sodium: 140 mmol/L (ref 135–145)

## 2018-02-03 NOTE — Telephone Encounter (Signed)
   [facility] Pre-operative Risk Assessment    Request for surgical clearance:  1. What type of surgery is being performed? OPEN REDUCTION INTERNAL FIXATION RIGHT DISTAL RADIUS FRACTURE   2. When is this surgery scheduled? 02/04/2018   3. What type of clearance is required (medical clearance vs. Pharmacy clearance to hold med vs. Both)? BOTH  4. Are there any medications that need to be held prior to surgery and how long? [name]   5. Practice name and name of physician performing surgery? [facility]-DR. KEVIN KUZMA   6. What is your office phone and fax number? [name], RN   7. Anesthesia type (None, local, MAC, general) ? CHOICE   Nuala Alpha 02/03/2018, 11:06 AM  _________________________________________________________________   (provider comments below)

## 2018-02-03 NOTE — Pre-Procedure Instructions (Signed)
Dr. Smith Robert reviewed EKG - [facility] for surgery.

## 2018-02-03 NOTE — Telephone Encounter (Signed)
   Primary Cardiologist:Philip Nahser, MD  Chart reviewed as part of pre-operative protocol coverage. Because of [name] [name]'s past medical history and time since last visit (nearly a year since last visit), he/she will require a follow-up visit in order to better assess preoperative cardiovascular risk.  Pre-op covering staff: - Please schedule appointment and call patient to inform them. - Please contact requesting surgeon's office via preferred method (i.e, phone, fax) to inform them of need for appointment prior to surgery.  Murray Hodgkins, NP  02/03/2018, 5:06 PM

## 2018-02-04 ENCOUNTER — Other Ambulatory Visit: Payer: Self-pay

## 2018-02-04 ENCOUNTER — Encounter (HOSPITAL_BASED_OUTPATIENT_CLINIC_OR_DEPARTMENT_OTHER): Payer: Self-pay

## 2018-02-04 ENCOUNTER — Encounter (HOSPITAL_BASED_OUTPATIENT_CLINIC_OR_DEPARTMENT_OTHER)
Admission: RE | Disposition: A | Discharge status: Home / Self Care | Payer: Self-pay | Source: Ambulatory Visit | Attending: Orthopedic Surgery

## 2018-02-04 ENCOUNTER — Ambulatory Visit (HOSPITAL_BASED_OUTPATIENT_CLINIC_OR_DEPARTMENT_OTHER): Payer: Medicare Other | Admitting: Anesthesiology

## 2018-02-04 ENCOUNTER — Ambulatory Visit (HOSPITAL_BASED_OUTPATIENT_CLINIC_OR_DEPARTMENT_OTHER)
Admission: RE | Admit: 2018-02-04 | Discharge: 2018-02-04 | Disposition: A | Discharge status: Home / Self Care | Payer: Medicare Other | Source: Ambulatory Visit | Attending: Orthopedic Surgery | Admitting: Orthopedic Surgery

## 2018-02-04 DIAGNOSIS — Z86718 Personal history of other venous thrombosis and embolism: Secondary | ICD-10-CM | POA: Diagnosis not present

## 2018-02-04 DIAGNOSIS — E119 Type 2 diabetes mellitus without complications: Secondary | ICD-10-CM | POA: Diagnosis not present

## 2018-02-04 DIAGNOSIS — S52501A Unspecified fracture of the lower end of right radius, initial encounter for closed fracture: Secondary | ICD-10-CM | POA: Diagnosis not present

## 2018-02-04 DIAGNOSIS — S52571A Other intraarticular fracture of lower end of right radius, initial encounter for closed fracture: Secondary | ICD-10-CM | POA: Diagnosis not present

## 2018-02-04 DIAGNOSIS — Z794 Long term (current) use of insulin: Secondary | ICD-10-CM | POA: Insufficient documentation

## 2018-02-04 DIAGNOSIS — Z955 Presence of coronary angioplasty implant and graft: Secondary | ICD-10-CM | POA: Diagnosis not present

## 2018-02-04 DIAGNOSIS — I252 Old myocardial infarction: Secondary | ICD-10-CM | POA: Diagnosis not present

## 2018-02-04 DIAGNOSIS — I2510 Atherosclerotic heart disease of native coronary artery without angina pectoris: Secondary | ICD-10-CM | POA: Insufficient documentation

## 2018-02-04 DIAGNOSIS — W19XXXA Unspecified fall, initial encounter: Secondary | ICD-10-CM | POA: Insufficient documentation

## 2018-02-04 DIAGNOSIS — I10 Essential (primary) hypertension: Secondary | ICD-10-CM | POA: Diagnosis not present

## 2018-02-04 HISTORY — PX: OPEN REDUCTION INTERNAL FIXATION (ORIF) DISTAL RADIAL FRACTURE: SHX5989

## 2018-02-04 LAB — GLUCOSE, CAPILLARY: GLUCOSE-CAPILLARY: 87 mg/dL (ref 65–99)

## 2018-02-04 SURGERY — OPEN REDUCTION INTERNAL FIXATION (ORIF) DISTAL RADIUS FRACTURE
Anesthesia: Monitor Anesthesia Care | Site: Wrist | Laterality: Right

## 2018-02-04 MED ORDER — HYDROCODONE-ACETAMINOPHEN 5-325 MG PO TABS: ORAL_TABLET | ORAL | 0 refills | Status: DC

## 2018-02-04 MED ORDER — FENTANYL CITRATE (PF) 100 MCG/2ML IJ SOLN: 25.0000 ug | INTRAMUSCULAR | Status: DC | PRN

## 2018-02-04 MED ORDER — FENTANYL CITRATE (PF) 100 MCG/2ML IJ SOLN
50.0000 ug | INTRAMUSCULAR | Status: DC | PRN
  Administered 2018-02-04: 50 ug via INTRAVENOUS

## 2018-02-04 MED ORDER — CHLORHEXIDINE GLUCONATE 4 % EX LIQD: 60.0000 mL | Freq: Once | CUTANEOUS | Status: DC

## 2018-02-04 MED ORDER — CLINDAMYCIN PHOSPHATE 900 MG/50ML IV SOLN
900.0000 mg | INTRAVENOUS | Status: AC
  Administered 2018-02-04: 900 mg via INTRAVENOUS

## 2018-02-04 MED ORDER — MIDAZOLAM HCL 2 MG/2ML IJ SOLN
INTRAMUSCULAR | Status: AC
  Filled 2018-02-04: qty 2

## 2018-02-04 MED ORDER — BUPIVACAINE-EPINEPHRINE (PF) 0.5% -1:200000 IJ SOLN
INTRAMUSCULAR | Status: DC | PRN
  Administered 2018-02-04: 30 mL via PERINEURAL

## 2018-02-04 MED ORDER — DEXAMETHASONE SODIUM PHOSPHATE 10 MG/ML IJ SOLN
INTRAMUSCULAR | Status: AC
  Filled 2018-02-04: qty 1

## 2018-02-04 MED ORDER — PROPOFOL 10 MG/ML IV BOLUS
INTRAVENOUS | Status: AC
  Filled 2018-02-04: qty 20

## 2018-02-04 MED ORDER — LIDOCAINE HCL (CARDIAC) 20 MG/ML IV SOLN
INTRAVENOUS | Status: AC
Start: 2018-02-04 — End: 2018-02-04
  Filled 2018-02-04: qty 5

## 2018-02-04 MED ORDER — LACTATED RINGERS IV SOLN
INTRAVENOUS | Status: DC
  Administered 2018-02-04: 14:00:00 via INTRAVENOUS

## 2018-02-04 MED ORDER — 0.9 % SODIUM CHLORIDE (POUR BTL) OPTIME
TOPICAL | Status: DC | PRN
  Administered 2018-02-04: 200 mL

## 2018-02-04 MED ORDER — PROPOFOL 500 MG/50ML IV EMUL
INTRAVENOUS | Status: DC | PRN
  Administered 2018-02-04: 200 ug/kg/min via INTRAVENOUS

## 2018-02-04 MED ORDER — CLINDAMYCIN PHOSPHATE 900 MG/50ML IV SOLN
INTRAVENOUS | Status: AC
  Filled 2018-02-04: qty 50

## 2018-02-04 MED ORDER — ONDANSETRON HCL 4 MG/2ML IJ SOLN
INTRAMUSCULAR | Status: DC | PRN
  Administered 2018-02-04: 4 mg via INTRAVENOUS

## 2018-02-04 MED ORDER — ONDANSETRON HCL 4 MG/2ML IJ SOLN
INTRAMUSCULAR | Status: AC
  Filled 2018-02-04: qty 2

## 2018-02-04 MED ORDER — FENTANYL CITRATE (PF) 100 MCG/2ML IJ SOLN
INTRAMUSCULAR | Status: AC
  Filled 2018-02-04: qty 2

## 2018-02-04 MED ORDER — PROPOFOL 10 MG/ML IV BOLUS
INTRAVENOUS | Status: DC | PRN
  Administered 2018-02-04: 30 mg via INTRAVENOUS

## 2018-02-04 MED ORDER — ONDANSETRON HCL 4 MG/2ML IJ SOLN: 4.0000 mg | Freq: Once | INTRAMUSCULAR | Status: DC | PRN

## 2018-02-04 MED ORDER — MIDAZOLAM HCL 2 MG/2ML IJ SOLN
1.0000 mg | INTRAMUSCULAR | Status: DC | PRN
  Administered 2018-02-04: 1 mg via INTRAVENOUS

## 2018-02-04 MED ORDER — SCOPOLAMINE 1 MG/3DAYS TD PT72: 1.0000 | MEDICATED_PATCH | Freq: Once | TRANSDERMAL | Status: DC | PRN

## 2018-02-04 SURGICAL SUPPLY — 60 items
BANDAGE ACE 3X5.8 VEL STRL LF (GAUZE/BANDAGES/DRESSINGS) ×3 IMPLANT
BIT DRILL 2.0 LNG QUCK RELEASE (BIT) ×1 IMPLANT
BIT DRILL 2.8X5 QR DISP (BIT) ×3 IMPLANT
BLADE SURG 15 STRL LF DISP TIS (BLADE) ×2 IMPLANT
BLADE SURG 15 STRL SS (BLADE) ×4
BNDG ESMARK 4X9 LF (GAUZE/BANDAGES/DRESSINGS) ×3 IMPLANT
BNDG GAUZE ELAST 4 BULKY (GAUZE/BANDAGES/DRESSINGS) ×3 IMPLANT
BNDG PLASTER X FAST 3X3 WHT LF (CAST SUPPLIES) ×30 IMPLANT
BONE CHIP PRESERV 5CC PCAN5 (Bone Implant) ×3 IMPLANT
CHLORAPREP W/TINT 26ML (MISCELLANEOUS) ×3 IMPLANT
CORD BIPOLAR FORCEPS 12FT (ELECTRODE) ×3 IMPLANT
COVER BACK TABLE 60X90IN (DRAPES) ×3 IMPLANT
COVER MAYO STAND STRL (DRAPES) ×3 IMPLANT
CUFF TOURNIQUET SINGLE 18IN (TOURNIQUET CUFF) ×3 IMPLANT
DRAPE EXTREMITY T 121X128X90 (DRAPE) ×3 IMPLANT
DRAPE OEC MINIVIEW 54X84 (DRAPES) ×3 IMPLANT
DRAPE SURG 17X23 STRL (DRAPES) ×3 IMPLANT
DRILL 2.0 LNG QUICK RELEASE (BIT) ×3
GAUZE SPONGE 4X4 12PLY STRL (GAUZE/BANDAGES/DRESSINGS) ×3 IMPLANT
GAUZE XEROFORM 1X8 LF (GAUZE/BANDAGES/DRESSINGS) ×3 IMPLANT
GLOVE BIOGEL PI IND STRL 7.0 (GLOVE) ×1 IMPLANT
GLOVE BIOGEL PI IND STRL 8 (GLOVE) ×2 IMPLANT
GLOVE BIOGEL PI IND STRL 8.5 (GLOVE) ×1 IMPLANT
GLOVE BIOGEL PI INDICATOR 7.0 (GLOVE) ×2
GLOVE BIOGEL PI INDICATOR 8 (GLOVE) ×4
GLOVE BIOGEL PI INDICATOR 8.5 (GLOVE) ×2
GLOVE SURG SS PI 7.5 STRL IVOR (GLOVE) ×6 IMPLANT
GLOVE SURG SS PI 8.0 STRL IVOR (GLOVE) ×3 IMPLANT
GOWN STRL REUS W/ TWL LRG LVL3 (GOWN DISPOSABLE) IMPLANT
GOWN STRL REUS W/ TWL XL LVL3 (GOWN DISPOSABLE) ×1 IMPLANT
GOWN STRL REUS W/TWL LRG LVL3 (GOWN DISPOSABLE)
GOWN STRL REUS W/TWL XL LVL3 (GOWN DISPOSABLE) ×8 IMPLANT
GUIDEWIRE ORTHO 0.054X6 (WIRE) ×9 IMPLANT
NEEDLE HYPO 25X1 1.5 SAFETY (NEEDLE) IMPLANT
NS IRRIG 1000ML POUR BTL (IV SOLUTION) ×3 IMPLANT
PACK BASIN DAY SURGERY FS (CUSTOM PROCEDURE TRAY) ×3 IMPLANT
PAD CAST 3X4 CTTN HI CHSV (CAST SUPPLIES) ×1 IMPLANT
PADDING CAST COTTON 3X4 STRL (CAST SUPPLIES) ×2
PLATE PROXIMAL VDU ACULOC (Plate) ×3 IMPLANT
SCREW ACTK 2 NL HEX 3.5.11 (Screw) ×3 IMPLANT
SCREW CORT FT 18X2.3XLCK HEX (Screw) ×1 IMPLANT
SCREW CORT FT 20X2.3XLCK HEX (Screw) ×1 IMPLANT
SCREW CORTICAL LOCKING 2.3X16M (Screw) ×2 IMPLANT
SCREW CORTICAL LOCKING 2.3X18M (Screw) ×6 IMPLANT
SCREW CORTICAL LOCKING 2.3X20M (Screw) ×4 IMPLANT
SCREW FX16X2.3XLCK SMTH NS CRT (Screw) ×1 IMPLANT
SCREW FX18X2.3XSMTH LCK NS CRT (Screw) ×2 IMPLANT
SCREW FX20X2.3XSMTH LCK NS CRT (Screw) ×1 IMPLANT
SCREW HEXALOBE NON-LOCK 3.5X14 (Screw) ×3 IMPLANT
SCREW NONLOCK HEX 3.5X12 (Screw) ×3 IMPLANT
SLEEVE SCD COMPRESS KNEE MED (MISCELLANEOUS) ×3 IMPLANT
SLING ARM FOAM STRAP LRG (SOFTGOODS) ×3 IMPLANT
STOCKINETTE 4X48 STRL (DRAPES) ×3 IMPLANT
SUT ETHILON 4 0 PS 2 18 (SUTURE) ×6 IMPLANT
SUT VICRYL 4-0 PS2 18IN ABS (SUTURE) ×3 IMPLANT
SYR BULB 3OZ (MISCELLANEOUS) ×3 IMPLANT
SYR CONTROL 10ML LL (SYRINGE) IMPLANT
TOWEL OR 17X24 6PK STRL BLUE (TOWEL DISPOSABLE) ×3 IMPLANT
TOWEL OR NON WOVEN STRL DISP B (DISPOSABLE) ×3 IMPLANT
UNDERPAD 30X30 (UNDERPADS AND DIAPERS) ×3 IMPLANT

## 2018-02-04 NOTE — Transfer of Care (Signed)
Immediate Anesthesia Transfer of Care Note  Patient: [name] [name]  Procedure(s) Performed: OPEN REDUCTION INTERNAL FIXATION (ORIF) RIGHT DISTAL RADIAL FRACTURE (Right Wrist)  Patient Location: PACU  Anesthesia Type:MAC combined with regional for post-op pain  Level of Consciousness: awake, alert , oriented and patient cooperative  Airway & Oxygen Therapy: Patient Spontanous Breathing and Patient connected to face mask oxygen  Post-op Assessment: Report given to RN and Post -op Vital signs reviewed and stable  Post vital signs: Reviewed and stable  Last Vitals:  Vitals Value Taken Time  BP    Temp    Pulse 67 02/04/2018  4:16 PM  Resp    SpO2 100 % 02/04/2018  4:16 PM  Vitals shown include unvalidated device data.  Last Pain:  Vitals:   02/04/18 1319  TempSrc: Oral  PainSc: 0-No pain      Patients Stated Pain Goal: 0 (77/41/28 7867)  Complications: No apparent anesthesia complications

## 2018-02-04 NOTE — Discharge Instructions (Addendum)

## 2018-02-04 NOTE — Anesthesia Preprocedure Evaluation (Signed)
Anesthesia Evaluation  Patient identified by MRN, date of birth, ID band Patient awake    Reviewed: Allergy & Precautions, NPO status , Patient's Chart, lab work & pertinent test results, reviewed documented beta blocker date and time   Airway Mallampati: II  TM Distance: >3 FB Neck ROM: Full    Dental  (+) Teeth Intact, Dental Advisory Given, Chipped,    Pulmonary neg pulmonary ROS,    Pulmonary exam normal breath sounds clear to auscultation       Cardiovascular hypertension, Pt. on home beta blockers + CAD, + Past MI, + Cardiac Stents and + DVT  Normal cardiovascular exam Rhythm:Regular Rate:Normal     Neuro/Psych negative neurological ROS  negative psych ROS   GI/Hepatic negative GI ROS, Neg liver ROS,   Endo/Other  diabetes, Type 2, Insulin Dependent  Renal/GU negative Renal ROS     Musculoskeletal RIGHT DISTAL RADIUS FRACTURE   Abdominal   Peds  Hematology  (+) Blood dyscrasia (Xarelto), anemia ,   Anesthesia Other Findings Day of surgery medications reviewed with the patient.  Reproductive/Obstetrics                             Anesthesia Physical Anesthesia Plan  ASA: II  Anesthesia Plan: MAC and Regional   Post-op Pain Management:    Induction: Intravenous  PONV Risk Score and Plan: 2 and Propofol infusion, Treatment may vary due to age or medical condition and Ondansetron  Airway Management Planned: Nasal Cannula  Additional Equipment:   Intra-op Plan:   Post-operative Plan:   Informed Consent: I have reviewed the patients History and Physical, chart, labs and discussed the procedure including the risks, benefits and alternatives for the proposed anesthesia with the patient or authorized representative who has indicated his/her understanding and acceptance.   Dental advisory given  Plan Discussed with: CRNA and Anesthesiologist  Anesthesia Plan Comments: (SCB  plus MAC)        Anesthesia Quick Evaluation

## 2018-02-04 NOTE — Op Note (Signed)
I assisted Surgeon(s) and Role:    * Leanora Cover, MD - Primary    Daryll Brod, MD - Assisting on the Procedure(s): OPEN REDUCTION INTERNAL FIXATION (ORIF) RIGHT DISTAL RADIAL FRACTURE on 02/04/2018.  I provided assistance on this case as follows: setup,approach,debridement,reduction stabilization, bone graft, fixation of the fracture, closure of the incisions and application of the dressings and splint.  Electronically signed by: Wynonia Sours, MD Date: 02/04/2018 Time: 4:14 PM

## 2018-02-04 NOTE — Anesthesia Postprocedure Evaluation (Signed)
Anesthesia Post Note  Patient: [name] [name]  Procedure(s) Performed: OPEN REDUCTION INTERNAL FIXATION (ORIF) RIGHT DISTAL RADIAL FRACTURE (Right Wrist)     Patient location during evaluation: PACU Anesthesia Type: Regional Level of consciousness: awake and alert Pain management: pain level controlled Vital Signs Assessment: post-procedure vital signs reviewed and stable Respiratory status: spontaneous breathing, nonlabored ventilation and respiratory function stable Cardiovascular status: stable and blood pressure returned to baseline Postop Assessment: no apparent nausea or vomiting Anesthetic complications: no    Last Vitals:  Vitals:   02/04/18 1645 02/04/18 1700  BP: (!) 127/57 (!) 109/53  Pulse: 70 70  Resp: (!) 22 (!) 22  Temp:    SpO2: 97% 92%    Last Pain:  Vitals:   02/04/18 1630  TempSrc:   PainSc: 0-No pain                 Catalina Gravel

## 2018-02-04 NOTE — Op Note (Signed)
02/04/2018 [facility]  Operative Note  Pre Op Diagnosis: Right comminuted intraarticular distal radius fracture  Post Op Diagnosis: Right comminuted intraarticular distal radius fracture  Procedure: ORIF Right comminuted intraarticular distal radius fracture, greater than 3 intraarticular fragments  Surgeon: Leanora Cover, MD  Assistant: Daryll Brod, MD  Anesthesia: Regional and sedation  Fluids: Per anesthesia flow sheet  EBL: minimal  Complications: None  Specimen: None  Tourniquet Time:  Total Tourniquet Time Documented: Upper Arm (Right) - 59 minutes Total: Upper Arm (Right) - 59 minutes   Disposition: Stable to PACU  INDICATIONS:  [name] [name] is a 72 y.o. female fell earlier this week injuring right wrist.  Seen in office where XR revealed distal radius fracture.  We discussed nonoperative and operative treatment options.  She wished to proceed with operative fixation.  Risks, benefits, and alternatives of surgery were discussed including the risk of blood loss; infection; damage to nerves, vessels, tendons, ligaments, bone; failure of surgery; need for additional surgery; complications with wound healing; continued pain; nonunion; malunion; stiffness.  We also discussed the possible need for bone graft and the benefits and risks including the possibility of disease transmission.  She voiced understanding of these risks and elected to proceed.   OPERATIVE COURSE:  After being identified preoperatively by myself, the patient and I agreed upon the procedure and site of procedure.  Surgical site was marked.  The risks, benefits and alternatives of the surgery were reviewed and she wished to proceed.  Surgical consent had been signed.  She was given IV clindamycin as preoperative antibiotic prophylaxis.  She was transferred to the operating room and placed on the operating room table in supine position with the Right upper extremity on an armboard. Regional and  sedation anesthesia was induced by the anesthesiologist.  The Right upper extremity was prepped and draped in normal sterile orthopedic fashion.  A surgical pause was performed between the surgeons, anesthesia and operating room staff, and all were in agreement as to the patient, procedure and site of procedure.  Tourniquet at the proximal aspect of the extremity was inflated to 250 mmHg after exsanguination of the limb with an Esmarch bandage.  Standard volar Mallie Mussel approach was used.  The bipolar electrocautery was used to obtain hemostasis.  The superficial and deep portions of the FCR tendon sheath were incised, and the FCR and FPL were swept ulnarly to protect the palmar cutaneous branch of the median nerve.  The brachioradialis was released at the radial side of the radius.  The pronator quadratus was released and elevated with the periosteal elevator.  The fracture site was identified and cleared of soft tissue interposition and hematoma.  It was reduced under direct visualization.  There was intraarticular extension and central depression.  The joint surface was elevated with a freer elevator and bone graft was placed.   An AcuMed volar distal radial locking plate was selected.  It was secured to the bone with the guidepins.  C-arm was used in AP and lateral projections to ensure appropriate reduction and position of the hardware and adjustments made as necessary.  Standard AO drilling and measuring technique was used.  A single screw was placed in the slotted hole in the shaft of the plate.  The distal holes were filled with locking pegs with the exception of the styloid holes, which were filled with locking screws.  The remaining holes in the shaft of the plate were filled with nonlocking screws.  Good purchase was obtained.  C-arm was used in AP, lateral and oblique projections to ensure appropriate reduction and position of hardware, which was the case.  There was no intra-articular penetration of  hardware.  The wound was copiously irrigated with sterile saline.  Pronator quadratus was repaired back over top of the plate using 4-0 Vicryl suture.  Vicryl suture was placed in the subcutaneous tissues in an inverted interrupted fashion and the skin was closed with 4-0 nylon in a horizontal mattress fashion.  There was good pronation and supination of the wrist without crepitance.  The wound was then dressed with sterile Xeroform, 4x4s, and wrapped with a Kerlix bandage.  A volar splint was placed and wrapped with Kerlix and Ace bandage.  Tourniquet was deflated at 59 minutes.  Fingertips were pink with brisk capillary refill after deflation of the tourniquet.  Operative drapes were broken down.  The patient was awoken from anesthesia safely.  She was transferred back to the stretcher and taken to the PACU in stable condition.  I will see her back in the office in one week for postoperative followup.  I will give her a prescription for norco 5/325 1-2 tabs PO q6 hours prn pain, dispense #30.    Tennis Must, MD Electronically signed, 02/04/18

## 2018-02-04 NOTE — Telephone Encounter (Signed)
    I have known [name] [name] for years. She has stable CAD , bilateral DVT ( on Xarelto) , HTN, hyperlipidemia She fell and broke her right radius . Scheduled for surgery   I called and discussed her recent status with her.  She is been very stable.  She has not had any episodes of angina.  She is been able to do all of her normal activities without severe chest pain or shortness of breath.  She is able to climb several flights of stairs without difficulty.  My opinion she is very stable from a cardiac standpoint.  She has held her Xarelto for the past 2 days.  She is at low risk for her upcoming wrist surgery today.  We will call and schedule her for follow-up office visit in the next several weeks.    Mertie Moores, MD  02/04/2018 9:29 AM    [address] [place],  [place], [place]  [id number] Pager [phone] Phone: [phone]; Fax: [phone]

## 2018-02-04 NOTE — Brief Op Note (Signed)
02/04/2018  4:32 PM  PATIENT:  [name] [name]  72 y.o. female  PRE-OPERATIVE DIAGNOSIS:  RIGHT DISTAL RADIUS FRACTURE  POST-OPERATIVE DIAGNOSIS:  RIGHT DISTAL RADIUS FRACTURE  PROCEDURE:  Procedure(s): OPEN REDUCTION INTERNAL FIXATION (ORIF) RIGHT DISTAL RADIAL FRACTURE (Right)  SURGEON:  Surgeon(s) and Role:    * Leanora Cover, MD - Primary    * Daryll Brod, MD - Assisting  PHYSICIAN ASSISTANT:   ASSISTANTS: Daryll Brod, MD   ANESTHESIA:   regional and sedation  EBL:  Minimal   BLOOD ADMINISTERED:none  DRAINS: none   LOCAL MEDICATIONS USED:  NONE  SPECIMEN:  No Specimen  DISPOSITION OF SPECIMEN:  N/A  COUNTS:  YES  TOURNIQUET:   Total Tourniquet Time Documented: Upper Arm (Right) - 59 minutes Total: Upper Arm (Right) - 59 minutes   DICTATION: .Note written in EPIC  PLAN OF CARE: Discharge to home after PACU  PATIENT DISPOSITION:  PACU - hemodynamically stable.

## 2018-02-04 NOTE — Progress Notes (Signed)
Assisted Dr. Turk with right, ultrasound guided, supraclavicular block. Side rails up, monitors on throughout procedure. See vital signs in flow sheet. Tolerated Procedure well. 

## 2018-02-04 NOTE — H&P (Signed)
[name] [name] is an 72 y.o. female.   Chief Complaint: right distal radius fracture HPI: 72 yo female states she fell injuring right wrist.  XR show distal radius fracture.  She wishes to undergo operative fixation.    Allergies:  Allergies  Allergen Reactions  . Ampicillin Hives, Swelling and Rash  . Avelox [Moxifloxacin Hcl In Nacl] Swelling  . Bactrim [Sulfamethoxazole-Trimethoprim] Hives, Swelling and Rash  . Ibuprofen Other (See Comments)    unknown  . Penicillins Other (See Comments)    unknown  . Codeine Rash    unknown  . Latex Rash    unknown    Past Medical History:  Diagnosis Date  . Anemia   . Coronary artery disease    s/p stent to LAD and LCx  . DM II (diabetes mellitus, type II), controlled ([facility])   . DVT (deep venous thrombosis) ([place])   . Elevated LFTs   . Hyperlipidemia   . Hypertension   . Myocardial infarction ([place] [place])    7 yrs ago    Past Surgical History:  Procedure Laterality Date  . CATARACT EXTRACTION    . CESAREAN SECTION  1983  . CORONARY ANGIOPLASTY WITH STENT PLACEMENT  04/26/2003   NORMAL. EF 65%  . I&D EXTREMITY  06/22/2012   Procedure: IRRIGATION AND DEBRIDEMENT EXTREMITY;  Surgeon: Tennis Must, MD;  Location: [facility];  Service: Orthopedics;  Laterality: Left;  . I&D EXTREMITY  06/29/2012   Procedure: IRRIGATION AND DEBRIDEMENT EXTREMITY;  Surgeon: Tennis Must, MD;  Location: [facility];  Service: Orthopedics;  Laterality: Left;    Family History: Family History  Problem Relation Age of Onset  . Heart attack Mother        X2  . Aneurysm Mother        AAA  . Stroke Mother   . Heart disease Mother   . Heart attack Father   . Heart disease Father   . Cirrhosis Father   . Osteoarthritis Sister   . Dementia Sister     Social History:   reports that she has never smoked. She has never used smokeless tobacco. She reports that she drinks alcohol. She reports that she does not use drugs.  Medications: No medications prior to admission.     Results for orders placed or performed during the hospital encounter of 02/04/18 (from the past 48 hour(s))  Basic metabolic panel     Status: Abnormal   Collection Time: 02/03/18 10:00 AM  Result Value Ref Range   Sodium 140 135 - 145 mmol/L   Potassium 4.6 3.5 - 5.1 mmol/L   Chloride 106 101 - 111 mmol/L   CO2 24 22 - 32 mmol/L   Glucose, Bld 106 (H) 65 - 99 mg/dL   BUN 17 6 - 20 mg/dL   Creatinine, Ser 1.00 0.44 - 1.00 mg/dL   Calcium 9.2 8.9 - 10.3 mg/dL   GFR calc non Af Amer 55 (L) >60 mL/min   GFR calc Af Amer >60 >60 mL/min    Comment: (NOTE) The eGFR has been calculated using the CKD EPI equation. This calculation has not been validated in all clinical situations. eGFR's persistently <60 mL/min signify possible Chronic Kidney Disease.    Anion gap 10 5 - 15    Comment: Performed at [facility] [address] [place], [place] [address]    No results found.   A comprehensive review of systems was negative.  Height '5\' 2"'  (1.575 m), weight 72.6 kg (  160 lb).  General appearance: alert, cooperative and appears stated age Head: Normocephalic, without obvious abnormality, atraumatic Neck: supple, symmetrical, trachea midline Cardio: regular rate and rhythm Resp: clear to auscultation bilaterally Extremities: Intact sensation and capillary refill all digits.  +epl/fpl/io.  No wounds.  Pulses: 2+ and symmetric Skin: Skin color, texture, turgor normal. No rashes or lesions Neurologic: Grossly normal Incision/Wound: none  Assessment/Plan Right distal radius fracture.  Non operative and operative treatment options were discussed with the patient and patient wishes to proceed with operative treatment. Risks, benefits, and alternatives of surgery were discussed and the patient agrees with the plan of care.   Keyairra Kolinski R 02/04/2018, 10:07 AM

## 2018-02-04 NOTE — Anesthesia Procedure Notes (Signed)
Anesthesia Regional Block: Supraclavicular block   Pre-Anesthetic Checklist: ,, timeout performed, Correct Patient, Correct Site, Correct Laterality, Correct Procedure, Correct Position, site marked, Risks and benefits discussed,  Surgical consent,  Pre-op evaluation,  At surgeon's request and post-op pain management  Laterality: Right  Prep: chloraprep       Needles:  Injection technique: Single-shot  Needle Type: Echogenic Needle     Needle Length: 9cm  Needle Gauge: 21     Additional Needles:   Procedures:,,,, ultrasound used (permanent image in chart),,,,  Narrative:  Start time: 02/04/2018 2:02 PM End time: 02/04/2018 2:08 PM Injection made incrementally with aspirations every 5 mL.  Performed by: Personally  Anesthesiologist: Catalina Gravel, MD  Additional Notes: No pain on injection. No increased resistance to injection. Injection made in 5cc increments.  Good needle visualization.  Patient tolerated procedure well.

## 2018-02-05 ENCOUNTER — Encounter (HOSPITAL_BASED_OUTPATIENT_CLINIC_OR_DEPARTMENT_OTHER): Payer: Self-pay | Admitting: Orthopedic Surgery

## 2018-02-05 ENCOUNTER — Other Ambulatory Visit: Payer: Self-pay | Admitting: Cardiovascular Disease

## 2018-02-05 MED ORDER — NITROGLYCERIN 0.4 MG SL SUBL: 0.4000 mg | SUBLINGUAL_TABLET | SUBLINGUAL | 0 refills | Status: DC | PRN

## 2018-02-05 NOTE — Telephone Encounter (Signed)
Pt's medication was sent to her pharmacy as requested. Confirmation received. 

## 2018-02-12 DIAGNOSIS — S52571D Other intraarticular fracture of lower end of right radius, subsequent encounter for closed fracture with routine healing: Secondary | ICD-10-CM | POA: Diagnosis not present

## 2018-02-12 DIAGNOSIS — M25631 Stiffness of right wrist, not elsewhere classified: Secondary | ICD-10-CM | POA: Diagnosis not present

## 2018-02-12 DIAGNOSIS — M25531 Pain in right wrist: Secondary | ICD-10-CM | POA: Diagnosis not present

## 2018-03-16 DIAGNOSIS — S52571D Other intraarticular fracture of lower end of right radius, subsequent encounter for closed fracture with routine healing: Secondary | ICD-10-CM | POA: Diagnosis not present

## 2018-04-09 ENCOUNTER — Encounter: Payer: Self-pay | Admitting: Cardiovascular Disease

## 2018-04-09 DIAGNOSIS — E1165 Type 2 diabetes mellitus with hyperglycemia: Secondary | ICD-10-CM | POA: Diagnosis not present

## 2018-04-09 DIAGNOSIS — R82998 Other abnormal findings in urine: Secondary | ICD-10-CM | POA: Diagnosis not present

## 2018-04-09 DIAGNOSIS — I10 Essential (primary) hypertension: Secondary | ICD-10-CM | POA: Diagnosis not present

## 2018-04-09 DIAGNOSIS — M859 Disorder of bone density and structure, unspecified: Secondary | ICD-10-CM | POA: Diagnosis not present

## 2018-04-09 DIAGNOSIS — E7849 Other hyperlipidemia: Secondary | ICD-10-CM | POA: Diagnosis not present

## 2018-04-16 DIAGNOSIS — E7849 Other hyperlipidemia: Secondary | ICD-10-CM | POA: Diagnosis not present

## 2018-04-16 DIAGNOSIS — M859 Disorder of bone density and structure, unspecified: Secondary | ICD-10-CM | POA: Diagnosis not present

## 2018-04-16 DIAGNOSIS — Z6827 Body mass index (BMI) 27.0-27.9, adult: Secondary | ICD-10-CM | POA: Diagnosis not present

## 2018-04-16 DIAGNOSIS — I119 Hypertensive heart disease without heart failure: Secondary | ICD-10-CM | POA: Diagnosis not present

## 2018-04-16 DIAGNOSIS — F3341 Major depressive disorder, recurrent, in partial remission: Secondary | ICD-10-CM | POA: Diagnosis not present

## 2018-04-16 DIAGNOSIS — Z7901 Long term (current) use of anticoagulants: Secondary | ICD-10-CM | POA: Diagnosis not present

## 2018-04-16 DIAGNOSIS — E663 Overweight: Secondary | ICD-10-CM | POA: Diagnosis not present

## 2018-04-16 DIAGNOSIS — Z794 Long term (current) use of insulin: Secondary | ICD-10-CM | POA: Diagnosis not present

## 2018-04-16 DIAGNOSIS — I252 Old myocardial infarction: Secondary | ICD-10-CM | POA: Diagnosis not present

## 2018-04-16 DIAGNOSIS — I2510 Atherosclerotic heart disease of native coronary artery without angina pectoris: Secondary | ICD-10-CM | POA: Diagnosis not present

## 2018-04-16 DIAGNOSIS — E1165 Type 2 diabetes mellitus with hyperglycemia: Secondary | ICD-10-CM | POA: Diagnosis not present

## 2018-04-16 DIAGNOSIS — Z0000 Encounter for general adult medical examination without abnormal findings: Secondary | ICD-10-CM | POA: Diagnosis not present

## 2018-04-19 DIAGNOSIS — Z1212 Encounter for screening for malignant neoplasm of rectum: Secondary | ICD-10-CM | POA: Diagnosis not present

## 2018-04-27 DIAGNOSIS — S52571D Other intraarticular fracture of lower end of right radius, subsequent encounter for closed fracture with routine healing: Secondary | ICD-10-CM | POA: Diagnosis not present

## 2018-04-28 DIAGNOSIS — E1165 Type 2 diabetes mellitus with hyperglycemia: Secondary | ICD-10-CM | POA: Diagnosis not present

## 2018-04-28 DIAGNOSIS — Z6828 Body mass index (BMI) 28.0-28.9, adult: Secondary | ICD-10-CM | POA: Diagnosis not present

## 2018-04-28 DIAGNOSIS — E7849 Other hyperlipidemia: Secondary | ICD-10-CM | POA: Diagnosis not present

## 2018-04-28 DIAGNOSIS — B369 Superficial mycosis, unspecified: Secondary | ICD-10-CM | POA: Diagnosis not present

## 2018-04-28 DIAGNOSIS — I10 Essential (primary) hypertension: Secondary | ICD-10-CM | POA: Diagnosis not present

## 2018-05-11 DIAGNOSIS — H01003 Unspecified blepharitis right eye, unspecified eyelid: Secondary | ICD-10-CM | POA: Diagnosis not present

## 2018-05-11 DIAGNOSIS — E119 Type 2 diabetes mellitus without complications: Secondary | ICD-10-CM | POA: Diagnosis not present

## 2018-05-11 DIAGNOSIS — H40013 Open angle with borderline findings, low risk, bilateral: Secondary | ICD-10-CM | POA: Diagnosis not present

## 2018-05-11 DIAGNOSIS — H35033 Hypertensive retinopathy, bilateral: Secondary | ICD-10-CM | POA: Diagnosis not present

## 2018-08-20 ENCOUNTER — Other Ambulatory Visit: Payer: Self-pay | Admitting: Internal Medicine

## 2018-08-20 DIAGNOSIS — Z1231 Encounter for screening mammogram for malignant neoplasm of breast: Secondary | ICD-10-CM

## 2018-08-27 ENCOUNTER — Ambulatory Visit
Admission: RE | Admit: 2018-08-27 | Discharge: 2018-08-27 | Disposition: A | Discharge status: Home / Self Care | Payer: Medicare Other | Source: Ambulatory Visit | Attending: Internal Medicine | Admitting: Internal Medicine

## 2018-08-27 DIAGNOSIS — Z1231 Encounter for screening mammogram for malignant neoplasm of breast: Secondary | ICD-10-CM | POA: Diagnosis not present

## 2018-09-28 DIAGNOSIS — Z794 Long term (current) use of insulin: Secondary | ICD-10-CM | POA: Diagnosis not present

## 2018-09-28 DIAGNOSIS — E1165 Type 2 diabetes mellitus with hyperglycemia: Secondary | ICD-10-CM | POA: Diagnosis not present

## 2018-09-28 DIAGNOSIS — I119 Hypertensive heart disease without heart failure: Secondary | ICD-10-CM | POA: Diagnosis not present

## 2018-09-28 DIAGNOSIS — F3341 Major depressive disorder, recurrent, in partial remission: Secondary | ICD-10-CM | POA: Diagnosis not present

## 2018-09-28 DIAGNOSIS — Z6826 Body mass index (BMI) 26.0-26.9, adult: Secondary | ICD-10-CM | POA: Diagnosis not present

## 2018-09-28 DIAGNOSIS — Z23 Encounter for immunization: Secondary | ICD-10-CM | POA: Diagnosis not present

## 2018-09-28 DIAGNOSIS — Z1389 Encounter for screening for other disorder: Secondary | ICD-10-CM | POA: Diagnosis not present

## 2018-09-28 DIAGNOSIS — Z86718 Personal history of other venous thrombosis and embolism: Secondary | ICD-10-CM | POA: Diagnosis not present

## 2018-09-28 DIAGNOSIS — E663 Overweight: Secondary | ICD-10-CM | POA: Diagnosis not present

## 2018-12-13 DIAGNOSIS — E663 Overweight: Secondary | ICD-10-CM | POA: Diagnosis not present

## 2018-12-13 DIAGNOSIS — D6489 Other specified anemias: Secondary | ICD-10-CM | POA: Diagnosis not present

## 2018-12-13 DIAGNOSIS — Z794 Long term (current) use of insulin: Secondary | ICD-10-CM | POA: Diagnosis not present

## 2018-12-13 DIAGNOSIS — E7849 Other hyperlipidemia: Secondary | ICD-10-CM | POA: Diagnosis not present

## 2018-12-13 DIAGNOSIS — Z7901 Long term (current) use of anticoagulants: Secondary | ICD-10-CM | POA: Diagnosis not present

## 2018-12-13 DIAGNOSIS — F3341 Major depressive disorder, recurrent, in partial remission: Secondary | ICD-10-CM | POA: Diagnosis not present

## 2018-12-13 DIAGNOSIS — Z6824 Body mass index (BMI) 24.0-24.9, adult: Secondary | ICD-10-CM | POA: Diagnosis not present

## 2018-12-13 DIAGNOSIS — E1165 Type 2 diabetes mellitus with hyperglycemia: Secondary | ICD-10-CM | POA: Diagnosis not present

## 2018-12-13 DIAGNOSIS — I2510 Atherosclerotic heart disease of native coronary artery without angina pectoris: Secondary | ICD-10-CM | POA: Diagnosis not present

## 2018-12-13 DIAGNOSIS — D649 Anemia, unspecified: Secondary | ICD-10-CM | POA: Diagnosis not present

## 2018-12-13 DIAGNOSIS — Z86718 Personal history of other venous thrombosis and embolism: Secondary | ICD-10-CM | POA: Diagnosis not present

## 2018-12-13 DIAGNOSIS — M859 Disorder of bone density and structure, unspecified: Secondary | ICD-10-CM | POA: Diagnosis not present

## 2018-12-13 DIAGNOSIS — I119 Hypertensive heart disease without heart failure: Secondary | ICD-10-CM | POA: Diagnosis not present

## 2018-12-15 DIAGNOSIS — M8589 Other specified disorders of bone density and structure, multiple sites: Secondary | ICD-10-CM | POA: Diagnosis not present

## 2018-12-15 DIAGNOSIS — M859 Disorder of bone density and structure, unspecified: Secondary | ICD-10-CM | POA: Diagnosis not present

## 2019-04-04 DIAGNOSIS — J309 Allergic rhinitis, unspecified: Secondary | ICD-10-CM | POA: Diagnosis not present

## 2019-04-04 DIAGNOSIS — H6691 Otitis media, unspecified, right ear: Secondary | ICD-10-CM | POA: Diagnosis not present

## 2019-04-04 DIAGNOSIS — E1165 Type 2 diabetes mellitus with hyperglycemia: Secondary | ICD-10-CM | POA: Diagnosis not present

## 2019-04-18 DIAGNOSIS — E7849 Other hyperlipidemia: Secondary | ICD-10-CM | POA: Diagnosis not present

## 2019-04-18 DIAGNOSIS — I10 Essential (primary) hypertension: Secondary | ICD-10-CM | POA: Diagnosis not present

## 2019-04-18 DIAGNOSIS — E1165 Type 2 diabetes mellitus with hyperglycemia: Secondary | ICD-10-CM | POA: Diagnosis not present

## 2019-04-25 DIAGNOSIS — R82998 Other abnormal findings in urine: Secondary | ICD-10-CM | POA: Diagnosis not present

## 2019-04-25 DIAGNOSIS — I10 Essential (primary) hypertension: Secondary | ICD-10-CM | POA: Diagnosis not present

## 2019-04-27 DIAGNOSIS — D473 Essential (hemorrhagic) thrombocythemia: Secondary | ICD-10-CM | POA: Diagnosis not present

## 2019-04-27 DIAGNOSIS — R600 Localized edema: Secondary | ICD-10-CM | POA: Diagnosis not present

## 2019-04-27 DIAGNOSIS — E1165 Type 2 diabetes mellitus with hyperglycemia: Secondary | ICD-10-CM | POA: Diagnosis not present

## 2019-04-27 DIAGNOSIS — Z86718 Personal history of other venous thrombosis and embolism: Secondary | ICD-10-CM | POA: Diagnosis not present

## 2019-04-27 DIAGNOSIS — Z0000 Encounter for general adult medical examination without abnormal findings: Secondary | ICD-10-CM | POA: Diagnosis not present

## 2019-04-27 DIAGNOSIS — M8580 Other specified disorders of bone density and structure, unspecified site: Secondary | ICD-10-CM | POA: Diagnosis not present

## 2019-04-27 DIAGNOSIS — Z794 Long term (current) use of insulin: Secondary | ICD-10-CM | POA: Diagnosis not present

## 2019-04-27 DIAGNOSIS — F3341 Major depressive disorder, recurrent, in partial remission: Secondary | ICD-10-CM | POA: Diagnosis not present

## 2019-04-27 DIAGNOSIS — I119 Hypertensive heart disease without heart failure: Secondary | ICD-10-CM | POA: Diagnosis not present

## 2019-04-27 DIAGNOSIS — D72829 Elevated white blood cell count, unspecified: Secondary | ICD-10-CM | POA: Diagnosis not present

## 2019-04-27 DIAGNOSIS — D649 Anemia, unspecified: Secondary | ICD-10-CM | POA: Diagnosis not present

## 2019-04-27 DIAGNOSIS — Z7901 Long term (current) use of anticoagulants: Secondary | ICD-10-CM | POA: Diagnosis not present

## 2019-05-13 DIAGNOSIS — H40013 Open angle with borderline findings, low risk, bilateral: Secondary | ICD-10-CM | POA: Diagnosis not present

## 2019-05-13 DIAGNOSIS — Z961 Presence of intraocular lens: Secondary | ICD-10-CM | POA: Diagnosis not present

## 2019-05-13 DIAGNOSIS — H3554 Dystrophies primarily involving the retinal pigment epithelium: Secondary | ICD-10-CM | POA: Diagnosis not present

## 2019-05-13 DIAGNOSIS — H04123 Dry eye syndrome of bilateral lacrimal glands: Secondary | ICD-10-CM | POA: Diagnosis not present

## 2019-05-13 DIAGNOSIS — H35033 Hypertensive retinopathy, bilateral: Secondary | ICD-10-CM | POA: Diagnosis not present

## 2019-07-06 ENCOUNTER — Telehealth: Payer: Self-pay

## 2019-07-06 NOTE — Telephone Encounter (Signed)

## 2019-07-08 ENCOUNTER — Other Ambulatory Visit: Payer: Self-pay

## 2019-07-08 ENCOUNTER — Encounter: Payer: Self-pay | Admitting: Cardiovascular Disease

## 2019-07-08 ENCOUNTER — Telehealth (INDEPENDENT_AMBULATORY_CARE_PROVIDER_SITE_OTHER): Payer: Medicare Other | Admitting: Cardiovascular Disease

## 2019-07-08 VITALS — BP 126/64 | HR 68 | Ht 62.0 in | Wt 131.0 lb

## 2019-07-08 DIAGNOSIS — I82509 Chronic embolism and thrombosis of unspecified deep veins of unspecified lower extremity: Secondary | ICD-10-CM | POA: Diagnosis not present

## 2019-07-08 DIAGNOSIS — E782 Mixed hyperlipidemia: Secondary | ICD-10-CM | POA: Diagnosis not present

## 2019-07-08 DIAGNOSIS — I82593 Chronic embolism and thrombosis of other specified deep vein of lower extremity, bilateral: Secondary | ICD-10-CM

## 2019-07-08 DIAGNOSIS — I2510 Atherosclerotic heart disease of native coronary artery without angina pectoris: Secondary | ICD-10-CM | POA: Diagnosis not present

## 2019-07-08 DIAGNOSIS — I10 Essential (primary) hypertension: Secondary | ICD-10-CM

## 2019-07-08 DIAGNOSIS — Z7189 Other specified counseling: Secondary | ICD-10-CM

## 2019-07-08 MED ORDER — METOPROLOL TARTRATE 25 MG PO TABS: 25.0000 mg | ORAL_TABLET | Freq: Two times a day (BID) | ORAL | 3 refills | Status: DC

## 2019-07-08 NOTE — Progress Notes (Signed)
Virtual Visit via Telephone Note   This visit type was conducted due to national recommendations for restrictions regarding the COVID-19 Pandemic (e.g. social distancing) in an effort to limit this patient's exposure and mitigate transmission in our community.  Due to her co-morbid illnesses, this patient is at least at moderate risk for complications without adequate follow up.  This format is felt to be most appropriate for this patient at this time.  The patient did not have access to video technology/had technical difficulties with video requiring transitioning to audio format only (telephone).  All issues noted in this document were discussed and addressed.  No physical exam could be performed with this format.  Please refer to the patient's chart for her  consent to telehealth for [facility]   Date:  07/08/2019   ID:  [name] [name], DOB 07/23/1946, MRN [id number]  Patient Location: Home Provider Location: Home  PCP:  [name], MD  Cardiologist:  Mertie Moores, MD  Electrophysiologist:  None   Problem List 1. Coronary artery disease-status post PTCA and stenting of her left complex artery. She status post PTCA and stenting of her right coronary artery in [place] in 2005. 2. Hyperlipidemia 3. Hypertension 4. Bilateral DVT -  2015 , on anticoagulation   [name] [name] is a 73 y.o. female with the above noted hx. She has had lots of problems with her husband who has bipolar disease ( will not take his meds). Otherwise she is doing well. She is still walking some but needs to get back into it  Nov. 16, 2015:  [name] [name] is a 73 y.o. female who I see for hx of CAD - hx of PCI / stenting of her LCX She was found to have a DVT in both legs.  She was seen by Dr. Virgina Jock.  No clear etiology was found    Mar 19, 2015 : [name] [name] is a 73 y.o. female who presents for  CAD   She has done well.  She had DVT in Sept. 2015. Still on xarelto.   Nov. 15, 2016: Doing well.  Has some  fatigue and chest tightness toward the end of the day. No CP with exertion.   Husband has been diagnosed with Parkinsons .  Lots of stress.   Mar 05, 2016:  [name] [name] is seen today  BP is a bit high.   Is not eating more salt than usual -   Oct. 30, 2017:  [name] [name] is seen today  No cardiac issues. No angina  Is frustrated with her husband who has Parkinsons and likely has some degree of dementia. He does not shower regularly   March 02, 2017:  [name] [name] is seen today for follow up visit for CAD, HTN,  and hyperlipidemia .  Is doing well .  No dyspnea. Has some fatigue at the end of the day which goes away after she relaxes.   Is not walking much  Able to mow her entire lawn without any problems.   Evaluation Performed:  Follow-Up Visit  Chief Complaint:  CAD, HTN  Sept. 4, 2020    [name] [name] is a 73 y.o. female with  CAD, HTN, HLD.  Doing well from a cardiac standpoint.  Has not been exercising much     The patient does not have symptoms concerning for COVID-19 infection (fever, chills, cough, or new shortness of breath).    Past Medical History:  Diagnosis Date  . Anemia   . Coronary artery disease  s/p stent to LAD and LCx  . DM II (diabetes mellitus, type II), controlled ([facility])   . DVT (deep venous thrombosis) ([place] [facility])   . Elevated LFTs   . Hyperlipidemia   . Hypertension   . Myocardial infarction ([facility])    7 yrs ago   Past Surgical History:  Procedure Laterality Date  . CATARACT EXTRACTION    . CESAREAN SECTION  1983  . CORONARY ANGIOPLASTY WITH STENT PLACEMENT  04/26/2003   NORMAL. EF 65%  . I&D EXTREMITY  06/22/2012   Procedure: IRRIGATION AND DEBRIDEMENT EXTREMITY;  Surgeon: Tennis Must, MD;  Location: [facility];  Service: Orthopedics;  Laterality: Left;  . I&D EXTREMITY  06/29/2012   Procedure: IRRIGATION AND DEBRIDEMENT EXTREMITY;  Surgeon: Tennis Must, MD;  Location: [facility];  Service: Orthopedics;  Laterality: Left;  . OPEN REDUCTION INTERNAL FIXATION  (ORIF) DISTAL RADIAL FRACTURE Right 02/04/2018   Procedure: OPEN REDUCTION INTERNAL FIXATION (ORIF) RIGHT DISTAL RADIAL FRACTURE;  Surgeon: Leanora Cover, MD;  Location: [facility];  Service: Orthopedics;  Laterality: Right;     No outpatient medications have been marked as taking for the 07/08/19 encounter (Appointment) with Marzetta Lanza, Wonda Cheng, MD.     Allergies:   Ampicillin, Avelox [moxifloxacin hcl in nacl], Bactrim [sulfamethoxazole-trimethoprim], Ibuprofen, Penicillins, Codeine, and Latex   Social History   Tobacco Use  . Smoking status: Never Smoker  . Smokeless tobacco: Never Used  Substance Use Topics  . Alcohol use: Yes    Comment: social   . Drug use: No     Family Hx: The patient's family history includes Aneurysm in her mother; Cirrhosis in her father; Dementia in her sister; Heart attack in her father and mother; Heart disease in her father and mother; Osteoarthritis in her sister; Stroke in her mother.  ROS:   Please see the history of present illness.     All other systems reviewed and are negative.   Prior CV studies:   The following studies were reviewed today:    Labs/Other Tests and Data Reviewed:    EKG:   Recent Labs: No results found for requested labs within last 8760 hours.   Recent Lipid Panel No results found for: CHOL, TRIG, HDL, CHOLHDL, LDLCALC, LDLDIRECT  Wt Readings from Last 3 Encounters:  02/04/18 158 lb (71.7 kg)  06/26/17 163 lb (73.9 kg)  03/02/17 165 lb (74.8 kg)     Objective:    Vital Signs:  There were no vitals taken for this visit.     ASSESSMENT & PLAN:    1. CAD:  No angina .   Encouraged her to exercise,  Continue to watch her diet.     2.  Hyperlipidemia;   Managed by Dr. Virgina Jock.     Will ask to have most recent labs faxed over.  3.   Hx of DVT:   She is on Xarelto 20 mg a day.  She can probably decrease the dose to 10 mg a day which is approved dose for DVT prophylaxis.  We will leave that decision up  to Dr. Virgina Jock.     COVID-19 Education: The signs and symptoms of COVID-19 were discussed with the patient and how to seek care for testing (follow up with PCP or arrange E-visit).  The importance of social distancing was discussed today.  Time:   Today, I have spent  18 minutes with the patient with telehealth technology discussing the above problems.     Medication Adjustments/Labs and  Tests Ordered: Current medicines are reviewed at length with the patient today.  Concerns regarding medicines are outlined above.   Tests Ordered: No orders of the defined types were placed in this encounter.   Medication Changes: No orders of the defined types were placed in this encounter.   Follow Up:  In Person in 6 month(s)  Signed, Mertie Moores, MD  07/08/2019 7:25 AM    [facility]

## 2019-07-08 NOTE — Patient Instructions (Signed)
Medication Instructions:  Your physician recommends that you continue on your current medications as directed. Please refer to the Current Medication list given to you today. A new prescription for your Metoprolol tartrate (Lopressor) has been sent to your pharmacy for 25 mg tablets, take one twice daily  If you need a refill on your cardiac medications before your next appointment, please call your pharmacy.   Lab work: None Ordered   Testing/Procedures: None Ordered   Follow-Up: At [facility], you and your health needs are our priority.  As part of our continuing mission to provide you with exceptional heart care, we have created designated Provider Care Teams.  These Care Teams include your primary Cardiologist (physician) and Advanced Practice Providers (APPs -  Physician Assistants and Nurse Practitioners) who all work together to provide you with the care you need, when you need it. You will need a follow up appointment in:  6 months.  Please call our office 2 months in advance to schedule this appointment.  You may see Mertie Moores, MD or one of the following Advanced Practice Providers on your designated Care Team: Richardson Dopp, PA-C [place], [place] . Daune Perch, NP

## 2019-08-18 DIAGNOSIS — Z23 Encounter for immunization: Secondary | ICD-10-CM | POA: Diagnosis not present

## 2019-08-26 DIAGNOSIS — Z7901 Long term (current) use of anticoagulants: Secondary | ICD-10-CM | POA: Diagnosis not present

## 2019-08-26 DIAGNOSIS — D473 Essential (hemorrhagic) thrombocythemia: Secondary | ICD-10-CM | POA: Diagnosis not present

## 2019-08-26 DIAGNOSIS — R634 Abnormal weight loss: Secondary | ICD-10-CM | POA: Diagnosis not present

## 2019-08-26 DIAGNOSIS — I119 Hypertensive heart disease without heart failure: Secondary | ICD-10-CM | POA: Diagnosis not present

## 2019-08-26 DIAGNOSIS — F3341 Major depressive disorder, recurrent, in partial remission: Secondary | ICD-10-CM | POA: Diagnosis not present

## 2019-08-26 DIAGNOSIS — R42 Dizziness and giddiness: Secondary | ICD-10-CM | POA: Diagnosis not present

## 2019-08-26 DIAGNOSIS — E1165 Type 2 diabetes mellitus with hyperglycemia: Secondary | ICD-10-CM | POA: Diagnosis not present

## 2019-08-26 DIAGNOSIS — D649 Anemia, unspecified: Secondary | ICD-10-CM | POA: Diagnosis not present

## 2019-09-01 ENCOUNTER — Telehealth: Payer: Self-pay | Admitting: Physician Assistant

## 2019-09-01 NOTE — Telephone Encounter (Signed)
Received a new hem referral from Dr. Virgina Jock at [facility] for thrombocytosis/leukocytosis/anemia. I spoke to the pt's daughter, [name], and scheduled [name] [name] to see [name] on 11/4 at 1:30pm. Aware to arrive 15 minutes early.

## 2019-09-07 ENCOUNTER — Inpatient Hospital Stay: Payer: Medicare Other

## 2019-09-07 ENCOUNTER — Other Ambulatory Visit: Payer: Self-pay

## 2019-09-07 ENCOUNTER — Encounter: Payer: Self-pay | Admitting: Physician Assistant

## 2019-09-07 ENCOUNTER — Other Ambulatory Visit: Payer: Self-pay | Admitting: Physician Assistant

## 2019-09-07 ENCOUNTER — Inpatient Hospital Stay: Payer: Medicare Other | Attending: Physician Assistant | Admitting: Physician Assistant

## 2019-09-07 VITALS — BP 137/55 | HR 76 | Temp 99.1°F | Resp 18 | Ht 62.0 in | Wt 128.9 lb

## 2019-09-07 DIAGNOSIS — D473 Essential (hemorrhagic) thrombocythemia: Secondary | ICD-10-CM

## 2019-09-07 DIAGNOSIS — D72829 Elevated white blood cell count, unspecified: Secondary | ICD-10-CM | POA: Diagnosis not present

## 2019-09-07 DIAGNOSIS — E119 Type 2 diabetes mellitus without complications: Secondary | ICD-10-CM | POA: Insufficient documentation

## 2019-09-07 DIAGNOSIS — D649 Anemia, unspecified: Secondary | ICD-10-CM | POA: Diagnosis not present

## 2019-09-07 DIAGNOSIS — D696 Thrombocytopenia, unspecified: Secondary | ICD-10-CM

## 2019-09-07 DIAGNOSIS — I252 Old myocardial infarction: Secondary | ICD-10-CM | POA: Insufficient documentation

## 2019-09-07 DIAGNOSIS — Z794 Long term (current) use of insulin: Secondary | ICD-10-CM | POA: Insufficient documentation

## 2019-09-07 DIAGNOSIS — Z79899 Other long term (current) drug therapy: Secondary | ICD-10-CM | POA: Diagnosis not present

## 2019-09-07 DIAGNOSIS — I10 Essential (primary) hypertension: Secondary | ICD-10-CM | POA: Insufficient documentation

## 2019-09-07 DIAGNOSIS — Z7901 Long term (current) use of anticoagulants: Secondary | ICD-10-CM | POA: Diagnosis not present

## 2019-09-07 DIAGNOSIS — Z86718 Personal history of other venous thrombosis and embolism: Secondary | ICD-10-CM | POA: Insufficient documentation

## 2019-09-07 DIAGNOSIS — R7989 Other specified abnormal findings of blood chemistry: Secondary | ICD-10-CM | POA: Insufficient documentation

## 2019-09-07 DIAGNOSIS — D75839 Thrombocytosis, unspecified: Secondary | ICD-10-CM

## 2019-09-07 DIAGNOSIS — E785 Hyperlipidemia, unspecified: Secondary | ICD-10-CM | POA: Diagnosis not present

## 2019-09-07 DIAGNOSIS — I2510 Atherosclerotic heart disease of native coronary artery without angina pectoris: Secondary | ICD-10-CM | POA: Diagnosis not present

## 2019-09-07 LAB — CMP (CANCER CENTER ONLY)
ALT: 9 U/L (ref 0–44)
AST: 22 U/L (ref 15–41)
Albumin: 4.3 g/dL (ref 3.5–5.0)
Alkaline Phosphatase: 42 U/L (ref 38–126)
Anion gap: 11 (ref 5–15)
BUN: 22 mg/dL (ref 8–23)
CO2: 25 mmol/L (ref 22–32)
Calcium: 9.2 mg/dL (ref 8.9–10.3)
Chloride: 103 mmol/L (ref 98–111)
Creatinine: 0.96 mg/dL (ref 0.44–1.00)
GFR, Est AFR Am: >60 mL/min (ref >60)
GFR, Estimated: 59 mL/min — ABNORMAL LOW (ref >60)
Glucose, Bld: 208 mg/dL — ABNORMAL HIGH (ref 70–99)
Potassium: 4.4 mmol/L (ref 3.5–5.1)
Sodium: 139 mmol/L (ref 135–145)
Total Bilirubin: 0.5 mg/dL (ref 0.3–1.2)
Total Protein: 6.2 g/dL — ABNORMAL LOW (ref 6.5–8.1)

## 2019-09-07 LAB — CBC WITH DIFFERENTIAL (CANCER CENTER ONLY)
Abs Immature Granulocytes: 0.41 10*3/uL — ABNORMAL HIGH (ref 0.00–0.07)
Basophils Absolute: 0.1 10*3/uL (ref 0.0–0.1)
Basophils Relative: 1 %
Eosinophils Absolute: 0.2 10*3/uL (ref 0.0–0.5)
Eosinophils Relative: 1 %
HCT: 29 % — ABNORMAL LOW (ref 36.0–46.0)
Hemoglobin: 8.8 g/dL — ABNORMAL LOW (ref 12.0–15.0)
Immature Granulocytes: 4 %
Lymphocytes Relative: 9 %
Lymphs Abs: 1.1 10*3/uL (ref 0.7–4.0)
MCH: 27.4 pg (ref 26.0–34.0)
MCHC: 30.3 g/dL (ref 30.0–36.0)
MCV: 90.3 fL (ref 80.0–100.0)
Monocytes Absolute: 0.6 10*3/uL (ref 0.1–1.0)
Monocytes Relative: 5 %
Neutro Abs: 9.4 10*3/uL — ABNORMAL HIGH (ref 1.7–7.7)
Neutrophils Relative %: 80 %
Platelet Count: 871 10*3/uL — ABNORMAL HIGH (ref 150–400)
RBC: 3.21 MIL/uL — ABNORMAL LOW (ref 3.87–5.11)
RDW: 28.5 % — ABNORMAL HIGH (ref 11.5–15.5)
WBC Count: 11.8 10*3/uL — ABNORMAL HIGH (ref 4.0–10.5)
nRBC: 0.7 % — ABNORMAL HIGH (ref 0.0–0.2)

## 2019-09-07 LAB — FERRITIN: Ferritin: 193 ng/mL (ref 11–307)

## 2019-09-07 LAB — IRON AND TIBC
Iron: 66 ug/dL (ref 41–142)
Saturation Ratios: 19 % — ABNORMAL LOW (ref 21–57)
TIBC: 339 ug/dL (ref 236–444)
UIBC: 273 ug/dL (ref 120–384)

## 2019-09-07 LAB — LACTATE DEHYDROGENASE: LDH: 616 U/L — ABNORMAL HIGH (ref 98–192)

## 2019-09-07 NOTE — Progress Notes (Signed)
[place] [phone]) [phone]   Fax:([phone]) [phone]  CONSULT NOTE  REFERRING PHYSICIAN: Dr. Virgina Jock MD  REASON FOR CONSULTATION:  Thrombocytosis, leukocytosis, and anemia.   HPI Jordon Bourquin is a 73 y.o. female with a past medical history significant for hypertension, coronary artery disease, STEMI and cardiac cath at the age of 48, DVT undergoing anticoagulation with Xarelto for the last 2-3 years, and diabetes mellitus is referred to clinic for evaluation of thrombocytosis, leukocytosis, and anemia.  The patient recently saw her primary care provider in October 2020 for a routine annual physical exam. While in clinic, she had routine labs performed.  Her total white blood cell count was elevated at 13.8, her hemoglobin was low at 9.4, and her platelet count was elevated 881,000, and her neutrophil count was at 80.6%. Per chart review, it appers that she had similar findings dating back at least 6-7 years ago. It appears that a referral for hematology and further evaluation for GI blood loss was recommended in the past, but the patient was very reluctant and adamant that she did not wish to pursue further evaluation. The patient is here today for further evaluation regarding her condition.   Today the patient is feeling fairly well without any concerning complaints except she does not wish to be present for evaluation today. She is accompanied by [name], and her other daughter [name], was available by phone. She denies any fever, chills, night sweats, or lymphadenopathy.  She reports about a 40-60 lb weight loss over the course of the last year. The patient lost her husband unexpectedly approximately 1 year ago. She denies any nausea, vomiting, diarrhea, constipation, abdominal pain, or abdominal fullness. She does report an occasional "queasiness" sensation and a decreased desire to eat, partially attributed to food taste changes.  She denies any bleeding or  bruising including melena, hematuria, hematochezia, abnormal vaginal bleeding, epistaxis, or gingival bleeding. The patient has been taking Xarelto and has been doing so for about 2-3 years. The patient is overdue for her colonoscopies, but she strongly opposes further evaluation with a colonoscopy. She states she has been tested for occult blood about 2 years ago which was reportedly negative. Of note, the patient's abnormal blood counts/anemia appears to pre-date her anti-coagulation use. She is unsure when she was first told that she is anemic. She took iron supplements for a few months and was told she did not need them anymore. She denies any recent infections. She denies any rashes or skin changes.  He denies any unusual headaches, lightheadedness, syncope, or visual changes.  He denies any numbness and tingling in her hands and feet.  The patient denies any particular dietary habits such as being vegetarian or vegan.   The patient's denies any family history for known hematologic or oncologic disorders. Her mother had an aortic aneurysm and heart disease. Not much is known about her fathers past medical history except that he also had heart disease.   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  The patient is a widow and has 3 children.  She denies any history of drug, alcohol, or tobacco use.  HPI  Past Medical History:  Diagnosis Date  . Anemia   . Coronary artery disease    s/p stent to LAD and LCx  . DM II (diabetes mellitus, type II), controlled ([place])   . DVT (deep venous thrombosis) ([place])   . Elevated LFTs   .  Hyperlipidemia   . Hypertension   . Myocardial infarction ([place])    7 yrs ago    Past Surgical History:  Procedure Laterality Date  . CATARACT EXTRACTION    . CESAREAN SECTION  1983  . CORONARY ANGIOPLASTY WITH STENT PLACEMENT  04/26/2003   NORMAL. EF 65%  . I&D EXTREMITY  06/22/2012   Procedure: IRRIGATION AND DEBRIDEMENT EXTREMITY;  Surgeon:  Tennis Must, MD;  Location: [facility];  Service: Orthopedics;  Laterality: Left;  . I&D EXTREMITY  06/29/2012   Procedure: IRRIGATION AND DEBRIDEMENT EXTREMITY;  Surgeon: Tennis Must, MD;  Location: [facility];  Service: Orthopedics;  Laterality: Left;  . OPEN REDUCTION INTERNAL FIXATION (ORIF) DISTAL RADIAL FRACTURE Right 02/04/2018   Procedure: OPEN REDUCTION INTERNAL FIXATION (ORIF) RIGHT DISTAL RADIAL FRACTURE;  Surgeon: Leanora Cover, MD;  Location: [facility];  Service: Orthopedics;  Laterality: Right;    Family History  Problem Relation Age of Onset  . Heart attack Mother        X2  . Aneurysm Mother        AAA  . Stroke Mother   . Heart disease Mother   . Heart attack Father   . Heart disease Father   . Cirrhosis Father   . Osteoarthritis Sister   . Dementia Sister     Social History Social History   Tobacco Use  . Smoking status: Never Smoker  . Smokeless tobacco: Never Used  Substance Use Topics  . Alcohol use: Yes    Comment: social   . Drug use: No    Allergies  Allergen Reactions  . Ampicillin Hives, Swelling and Rash  . Avelox [Moxifloxacin Hcl In Nacl] Swelling  . Bactrim [Sulfamethoxazole-Trimethoprim] Hives, Swelling and Rash  . Ibuprofen Other (See Comments)    unknown  . Penicillins Other (See Comments)    unknown  . Codeine Rash    unknown  . Latex Rash    unknown    Current Outpatient Medications  Medication Sig Dispense Refill  . fenofibrate micronized (LOFIBRA) 200 MG capsule TAKE ONE CAPSULE BY MOUTH ONCE DAILY BEFORE BREAKFAST 90 capsule 2  . Fish Oil-Cholecalciferol (FISH OIL + D3) 1000-1000 MG-UNIT CAPS Take 1 tablet by mouth 3 (three) times daily.    . insulin NPH-regular Human (NOVOLIN 70/30) (70-30) 100 UNIT/ML injection Inject 8 Units into the skin daily with breakfast.    . metFORMIN (GLUCOPHAGE) 500 MG tablet Take 500 mg by mouth 2 (two) times daily with a meal.      . metoprolol tartrate (LOPRESSOR) 25 MG tablet Take 1  tablet (25 mg total) by mouth 2 (two) times daily. 180 tablet 3  . nitroGLYCERIN (NITROSTAT) 0.4 MG SL tablet Place 1 tablet (0.4 mg total) under the tongue every 5 (five) minutes as needed. For chest pain. Please make yearly appt with Dr. Acie Fredrickson. 25 tablet 0  . rivaroxaban (XARELTO) 20 MG TABS tablet Take 20 mg by mouth daily with supper.    . rosuvastatin (CRESTOR) 20 MG tablet Take 10 mg by mouth daily.     No current facility-administered medications for this visit.     Review of Systems REVIEW OF SYSTEMS:   Review of Systems  Constitutional: Negative for appetite change, chills, fatigue, fever and unexpected weight change.  HENT: Negative for mouth sores, nosebleeds, sore throat and trouble swallowing.   Eyes: Negative for eye problems and icterus.  Respiratory: Negative for cough, hemoptysis, shortness of breath and wheezing.   Cardiovascular: Negative  for chest pain and leg swelling.  Gastrointestinal: Negative for abdominal pain, constipation, diarrhea, nausea and vomiting.  Genitourinary: Negative for bladder incontinence, difficulty urinating, dysuria, frequency and hematuria.   Musculoskeletal: Negative for back pain, gait problem, neck pain and neck stiffness.  Skin: Negative for itching and rash.  Neurological: Negative for dizziness, extremity weakness, gait problem, headaches, light-headedness and seizures.  Hematological: Negative for adenopathy. Does not bruise/bleed easily.  Psychiatric/Behavioral: Negative for confusion, depression and sleep disturbance. The patient is not nervous/anxious.     PHYSICAL EXAMINATION:  Blood pressure (!) 137/55, pulse 76, temperature 99.1 F (37.3 C), temperature source Temporal, resp. rate 18, height _0  (1.575 m), weight 128 lb 14.4 oz (58.5 kg), SpO2 100 %.  ECOG PERFORMANCE STATUS: 0   Physical Exam  Constitutional: Oriented to person, place, and time and well-developed, well-nourished, and in no distress.  HENT:  Head:  Normocephalic and atraumatic.  Mouth/Throat: Oropharynx is clear and moist. No oropharyngeal exudate. No thrush noted.  Eyes: Conjunctivae are normal. Right eye exhibits no discharge. Left eye exhibits no discharge. No scleral icterus.  Neck: Normal range of motion. Neck supple.  Cardiovascular: Murmur noted on exam. Normal rate, regular rhythm, and intact distal pulses.   Pulmonary/Chest: Effort normal and breath sounds normal. No respiratory distress. No wheezes. No rales.  Abdominal: Soft. Bowel sounds are normal. Exhibits no distension and no mass. There is no tenderness.  Musculoskeletal: Bilateral lower extremity edema noted. Normal range of motion.  Lymphadenopathy:    No cervical adenopathy.  Neurological: Alert and oriented to person, place, and time. Exhibits normal muscle tone. Gait normal. Coordination normal.  Skin: Skin is warm and dry. No rash noted. Not diaphoretic. No erythema. No pallor.  Psychiatric: Patient appears to be depressed. Mood, memory and judgment normal.  Vitals reviewed.  PERFORMANCE STATUS: ECOG 0  LABORATORY DATA: Lab Results  Component Value Date   WBC 6.8 06/26/2017   HGB 10.4 (L) 06/26/2017   HCT 31.2 (L) 06/26/2017   MCV 92.5 06/26/2017   PLT 846.0 (H) 06/26/2017      Chemistry      Component Value Date/Time   NA 140 02/03/2018 1000   K 4.6 02/03/2018 1000   CL 106 02/03/2018 1000   CO2 24 02/03/2018 1000   BUN 17 02/03/2018 1000   CREATININE 1.00 02/03/2018 1000      Component Value Date/Time   CALCIUM 9.2 02/03/2018 1000   ALKPHOS 172 (H) 07/03/2012 0625   AST 64 (H) 07/03/2012 0625   ALT 66 (H) 07/03/2012 0625   BILITOT 0.5 07/03/2012 0625       RADIOGRAPHIC STUDIES: No results found.  ASSESSMENT: This is a very pleasant 73 year old Caucasian female who was referred to the clinic for evaluation of thrombocytosis, leukocytosis, and anemia.   PLAN: The patient was seen with Dr. Julien Nordmann today.  The patient had a repeat CBC,  CMP, ferritin, iron studies, and LDH today. Her CBC showed persistent leukocytosis with a WBC count of 11.8, absolute neutrophil count of 9.4, Hemoglobin of 8.8, and platelet count of 871. Her iron studies are unremarkable. Her LDH is elevated at 616.   Dr. Julien Nordmann recommends further evaluation with JAK2 mutation testing to assess for a bone marrow pathology causing her condition.  The patient does not wish to undergo her routine screening colonoscopy at this time. She was given stool cards to assess for GI blood loss.   We will see the patient back for follow-up visit in 2 weeks  for evaluation and to review the results of her JAK 2 mutation testing.   If her etiology is still unclear at that time, then we may need to discuss further testing with a bone marrow biopsy and aspirate.   The patient voices understanding of current disease status and treatment options and is in agreement with the current care plan.  All questions were answered. The patient knows to call the clinic with any problems, questions or concerns. We can certainly see the patient much sooner if necessary.  Thank you so much for allowing me to participate in the care of [facility]. I will continue to follow up the patient with you and assist in her care.  I spent 40 minutes counseling the patient face to face. The total time spent in the appointment was 60 minutes.  Disclaimer: This note was dictated with voice recognition software. Similar sounding words can inadvertently be transcribed and may not be corrected upon review.   -  PA-C  ADDENDUM: Hematology/Oncology Attending: I had a face-to-face encounter with the patient today.  I recommended her care plan.  This is a very pleasant 73 years old white female who presented for evaluation of thrombocytosis as well as mild leukocytosis.  The patient had severe anemia of unclear etiology.  She was treated in the past with oral iron but that was  discontinued 2 years ago after she has normal iron study at that time. I had a lengthy discussion with the patient today about her condition and investigation to confirm the etiology of her condition. We will arrange for the patient to have repeat CBC, comprehensive metabolic panel, LDH as well as iron study and ferritin and JAK2 mutation. We will also ask the patient to have stool cards for Hemoccult. We will arrange for the patient to come back for follow-up visit in 2 weeks for evaluation and repeat CBC and discussion of her pending lab results. She may be considered for a bone marrow biopsy and aspirate if there is no clear etiology for her condition. The patient and her daughter agreed to the current plan. She was advised to call immediately if she has any concerning symptoms in the interval.  Disclaimer: This note was dictated with voice recognition software. Similar sounding words can inadvertently be transcribed and may be missed upon review. Eilleen Kempf, MD 09/07/19

## 2019-09-08 ENCOUNTER — Telehealth: Payer: Self-pay | Admitting: Internal Medicine

## 2019-09-08 NOTE — Telephone Encounter (Signed)
Scheduled per los. Called and spoke with daughter. Confirmed appt  °

## 2019-09-21 ENCOUNTER — Telehealth: Payer: Self-pay | Admitting: Internal Medicine

## 2019-09-21 ENCOUNTER — Inpatient Hospital Stay (HOSPITAL_BASED_OUTPATIENT_CLINIC_OR_DEPARTMENT_OTHER): Payer: Medicare Other | Admitting: Internal Medicine

## 2019-09-21 ENCOUNTER — Other Ambulatory Visit: Payer: Self-pay | Admitting: *Deleted

## 2019-09-21 ENCOUNTER — Encounter: Payer: Self-pay | Admitting: Internal Medicine

## 2019-09-21 ENCOUNTER — Other Ambulatory Visit: Payer: Self-pay

## 2019-09-21 ENCOUNTER — Inpatient Hospital Stay: Payer: Medicare Other

## 2019-09-21 VITALS — BP 143/73 | HR 65 | Temp 98.5°F | Resp 18 | Ht 62.0 in | Wt 129.4 lb

## 2019-09-21 DIAGNOSIS — D649 Anemia, unspecified: Secondary | ICD-10-CM

## 2019-09-21 DIAGNOSIS — Z5111 Encounter for antineoplastic chemotherapy: Secondary | ICD-10-CM | POA: Diagnosis not present

## 2019-09-21 DIAGNOSIS — I2510 Atherosclerotic heart disease of native coronary artery without angina pectoris: Secondary | ICD-10-CM

## 2019-09-21 DIAGNOSIS — D473 Essential (hemorrhagic) thrombocythemia: Secondary | ICD-10-CM

## 2019-09-21 DIAGNOSIS — I10 Essential (primary) hypertension: Secondary | ICD-10-CM | POA: Diagnosis not present

## 2019-09-21 DIAGNOSIS — D72829 Elevated white blood cell count, unspecified: Secondary | ICD-10-CM | POA: Diagnosis not present

## 2019-09-21 DIAGNOSIS — I252 Old myocardial infarction: Secondary | ICD-10-CM | POA: Diagnosis not present

## 2019-09-21 DIAGNOSIS — Z7189 Other specified counseling: Secondary | ICD-10-CM | POA: Diagnosis not present

## 2019-09-21 LAB — CMP (CANCER CENTER ONLY)
ALT: 11 U/L (ref 0–44)
AST: 21 U/L (ref 15–41)
Albumin: 4.3 g/dL (ref 3.5–5.0)
Alkaline Phosphatase: 45 U/L (ref 38–126)
Anion gap: 11 (ref 5–15)
BUN: 16 mg/dL (ref 8–23)
CO2: 23 mmol/L (ref 22–32)
Calcium: 9.3 mg/dL (ref 8.9–10.3)
Chloride: 108 mmol/L (ref 98–111)
Creatinine: 0.86 mg/dL (ref 0.44–1.00)
GFR, Est AFR Am: >60 mL/min
GFR, Estimated: >60 mL/min
Glucose, Bld: 67 mg/dL — ABNORMAL LOW (ref 70–99)
Potassium: 4.6 mmol/L (ref 3.5–5.1)
Sodium: 142 mmol/L (ref 135–145)
Total Bilirubin: 0.7 mg/dL (ref 0.3–1.2)
Total Protein: 6.4 g/dL — ABNORMAL LOW (ref 6.5–8.1)

## 2019-09-21 LAB — CBC WITH DIFFERENTIAL (CANCER CENTER ONLY)
Abs Immature Granulocytes: 0.49 10*3/uL — ABNORMAL HIGH (ref 0.00–0.07)
Basophils Absolute: 0.1 10*3/uL (ref 0.0–0.1)
Basophils Relative: 1 %
Eosinophils Absolute: 0.2 10*3/uL (ref 0.0–0.5)
Eosinophils Relative: 1 %
HCT: 31 % — ABNORMAL LOW (ref 36.0–46.0)
Hemoglobin: 9.5 g/dL — ABNORMAL LOW (ref 12.0–15.0)
Immature Granulocytes: 3 %
Lymphocytes Relative: 8 %
Lymphs Abs: 1.2 10*3/uL (ref 0.7–4.0)
MCH: 27.9 pg (ref 26.0–34.0)
MCHC: 30.6 g/dL (ref 30.0–36.0)
MCV: 90.9 fL (ref 80.0–100.0)
Monocytes Absolute: 0.6 10*3/uL (ref 0.1–1.0)
Monocytes Relative: 4 %
Neutro Abs: 12.3 10*3/uL — ABNORMAL HIGH (ref 1.7–7.7)
Neutrophils Relative %: 83 %
Platelet Count: 907 10*3/uL (ref 150–400)
RBC: 3.41 MIL/uL — ABNORMAL LOW (ref 3.87–5.11)
RDW: 28.5 % — ABNORMAL HIGH (ref 11.5–15.5)
WBC Count: 14.9 10*3/uL — ABNORMAL HIGH (ref 4.0–10.5)
nRBC: 1.5 % — ABNORMAL HIGH (ref 0.0–0.2)

## 2019-09-21 LAB — LACTATE DEHYDROGENASE: LDH: 628 U/L — ABNORMAL HIGH (ref 98–192)

## 2019-09-21 MED ORDER — HYDROXYUREA 500 MG PO CAPS: 500.0000 mg | ORAL_CAPSULE | Freq: Every day | ORAL | 2 refills | Status: DC

## 2019-09-21 NOTE — Telephone Encounter (Signed)
Scheduled per 11/18 los, patient is notified of upcoming appointment.

## 2019-09-21 NOTE — Progress Notes (Signed)
[place] Telephone:(336) [phone]   Fax:(336) [phone]  OFFICE PROGRESS NOTE  [name], MD [place] [address]  DIAGNOSIS: Essential thrombocythemia with positive JAK2 mutation [id number] diagnosed in October 2020.  PRIOR THERAPY: None  CURRENT THERAPY: Hydroxyurea 500 mg p.o. daily.  This is expected to start in the next few days.  INTERVAL HISTORY: [name] [name] 73 y.o. female returns to the clinic today for follow-up visit accompanied by her daughter.  The patient is feeling fine today with no concerning complaints.  She denied having any current chest pain, shortness of breath, cough or hemoptysis.  She denied having any fever or chills.  She has no nausea, vomiting, diarrhea or constipation.  She has no headache or visual changes.  She is very anxious about her condition.  She had several studies performed recently including Jak 2 mutation that was positive indicative of the diagnosis of essential thrombocythemia.  The patient had iron studies that was unremarkable for iron deficiency.  She continues to have anemia.  She is here today for evaluation and repeat CBC as well as discussion of her treatment options.   MEDICAL HISTORY: Past Medical History:  Diagnosis Date   Anemia    Coronary artery disease    s/p stent to LAD and LCx   DM II (diabetes mellitus, type II), controlled ([facility])    DVT (deep venous thrombosis) ([facility])    Elevated LFTs    Hyperlipidemia    Hypertension    Myocardial infarction ([place])    7 yrs ago    ALLERGIES:  is allergic to ampicillin; avelox [moxifloxacin hcl in nacl]; bactrim [sulfamethoxazole-trimethoprim]; ibuprofen; penicillins; codeine; and latex.  MEDICATIONS:  Current Outpatient Medications  Medication Sig Dispense Refill   fenofibrate micronized (LOFIBRA) 200 MG capsule TAKE ONE CAPSULE BY MOUTH ONCE DAILY BEFORE BREAKFAST 90 capsule 2   Fish Oil-Cholecalciferol (FISH OIL + D3) 1000-1000  MG-UNIT CAPS Take 1 tablet by mouth 3 (three) times daily.     metFORMIN (GLUCOPHAGE) 500 MG tablet Take 500 mg by mouth 2 (two) times daily with a meal.       metoprolol tartrate (LOPRESSOR) 25 MG tablet Take 1 tablet (25 mg total) by mouth 2 (two) times daily. 180 tablet 3   nitroGLYCERIN (NITROSTAT) 0.4 MG SL tablet Place 1 tablet (0.4 mg total) under the tongue every 5 (five) minutes as needed. For chest pain. Please make yearly appt with Dr. Acie Fredrickson. 25 tablet 0   rivaroxaban (XARELTO) 20 MG TABS tablet Take 20 mg by mouth daily with supper.     rosuvastatin (CRESTOR) 20 MG tablet Take 10 mg by mouth daily.     No current facility-administered medications for this visit.     SURGICAL HISTORY:  Past Surgical History:  Procedure Laterality Date   CATARACT EXTRACTION     CESAREAN SECTION  1983   CORONARY ANGIOPLASTY WITH STENT PLACEMENT  04/26/2003   NORMAL. EF 65%   I&D EXTREMITY  06/22/2012   Procedure: IRRIGATION AND DEBRIDEMENT EXTREMITY;  Surgeon: Tennis Must, MD;  Location: [facility];  Service: Orthopedics;  Laterality: Left;   I&D EXTREMITY  06/29/2012   Procedure: IRRIGATION AND DEBRIDEMENT EXTREMITY;  Surgeon: Tennis Must, MD;  Location: [facility];  Service: Orthopedics;  Laterality: Left;   OPEN REDUCTION INTERNAL FIXATION (ORIF) DISTAL RADIAL FRACTURE Right 02/04/2018   Procedure: OPEN REDUCTION INTERNAL FIXATION (ORIF) RIGHT DISTAL RADIAL FRACTURE;  Surgeon: Leanora Cover, MD;  Location: [facility];  Service: Orthopedics;  Laterality: Right;    REVIEW OF SYSTEMS:  Constitutional: positive for fatigue Eyes: negative Ears, nose, mouth, throat, and face: negative Respiratory: negative Cardiovascular: negative Gastrointestinal: negative Genitourinary:negative Integument/breast: negative Hematologic/lymphatic: negative Musculoskeletal:negative Neurological: negative Behavioral/Psych: negative Endocrine: negative Allergic/Immunologic: negative   PHYSICAL  EXAMINATION: General appearance: alert, cooperative, fatigued and no distress Head: Normocephalic, without obvious abnormality, atraumatic Neck: no adenopathy, no JVD, supple, symmetrical, trachea midline and thyroid not enlarged, symmetric, no tenderness/mass/nodules Lymph nodes: Cervical, supraclavicular, and axillary nodes normal. Resp: clear to auscultation bilaterally Back: symmetric, no curvature. ROM normal. No CVA tenderness. Cardio: regular rate and rhythm, S1, S2 normal, no murmur, click, rub or gallop GI: soft, non-tender; bowel sounds normal; no masses,  no organomegaly Extremities: extremities normal, atraumatic, no cyanosis or edema Neurologic: Alert and oriented X 3, normal strength and tone. Normal symmetric reflexes. Normal coordination and gait  ECOG PERFORMANCE STATUS: 1 - Symptomatic but completely ambulatory  Blood pressure (!) 143/73, pulse 65, temperature 98.5 F (36.9 C), temperature source Oral, resp. rate 18, height 5\' 2"  (1.575 m), weight 129 lb 6.4 oz (58.7 kg), SpO2 100 %.  LABORATORY DATA: Lab Results  Component Value Date   WBC 14.9 (H) 09/21/2019   HGB 9.5 (L) 09/21/2019   HCT 31.0 (L) 09/21/2019   MCV 90.9 09/21/2019   PLT 907 (HH) 09/21/2019      Chemistry      Component Value Date/Time   NA 139 09/07/2019 1325   K 4.4 09/07/2019 1325   CL 103 09/07/2019 1325   CO2 25 09/07/2019 1325   BUN 22 09/07/2019 1325   CREATININE 0.96 09/07/2019 1325      Component Value Date/Time   CALCIUM 9.2 09/07/2019 1325   ALKPHOS 42 09/07/2019 1325   AST 22 09/07/2019 1325   ALT 9 09/07/2019 1325   BILITOT 0.5 09/07/2019 1325       RADIOGRAPHIC STUDIES: No results found.  ASSESSMENT AND PLAN: This is a very pleasant 73 years old white female recently diagnosed with essential thrombocythemia with positive JAK2 mutation.  The patient also has anemia and leukocytosis secondary to the myeloproliferative disorder. I had a lengthy discussion with the patient  and her daughters today about her current condition and treatment options.  I explained to the patient her underlying condition and the effect of essential thrombocythemia with increased risk for thrombosis and complication like heart disease or stroke. I also explained to the patient that there is a small percentage of people with her conditions that can convert to other myeloproliferative disorder condition like chronic myeloid leukemia. I recommended for the patient treatment with hydroxyurea 500 mg p.o. daily to start with and we can adjust her dose according to the platelets count.  The patient is very concerned about the cost of her medications and is very reluctant about taking any additional medicines.  She will discuss it a little bit further with her family. Other future options could be treatment with Jakafi but this definitely will be very expensive for the patient. I discussed with the patient the adverse effect of the hydroxyurea including but not limited to mild alopecia, myelosuppression, nausea and vomiting as well as liver or renal dysfunction. I will arrange for her to come back for follow-up visit in 2 weeks for reevaluation and repeat blood work. She was advised to call immediately if she has any concerning symptoms in the interval. The patient voices understanding of current disease status and treatment options and  is in agreement with the current care plan.  All questions were answered. The patient knows to call the clinic with any problems, questions or concerns. We can certainly see the patient much sooner if necessary.  I spent 15 minutes counseling the patient face to face. The total time spent in the appointment was 25 minutes.  Disclaimer: This note was dictated with voice recognition software. Similar sounding words can inadvertently be transcribed and may not be corrected upon review.

## 2019-09-26 LAB — JAK2 (INCLUDING V617F AND EXON 12), MPL,& CALR-NEXT GEN SEQ

## 2019-10-04 NOTE — Progress Notes (Signed)
[facility] OFFICE PROGRESS NOTE  [name], [place] [address]  DIAGNOSIS: Essential thrombocythemia with positive JAK2 mutation [id number] diagnosed in October 2020.  PRIOR THERAPY: None  CURRENT THERAPY: Hydroxyurea 500 mg p.o. daily. First dose on 09/24/2019. Increased to 1000 mg p.o. daily of hydroxyurea on 10/05/2019.   INTERVAL HISTORY: [name] [name] 73 y.o. female returns to the clinic for a follow up visit accompanied by her daughter. The patient was recently diagnosed with essential thrombocythemia. She started 500 mg p.o. daily of hydroxyurea for this condition on 09/24/2019 and is status post 1.5 weeks of treatment. She is tolerating this fairly well without any concerning side effects except for mild nausea. She denies any nausea prior to treatment with hydroxyurea. She states that she does not experience nausea daily. She denies associated vomiting. She denies any recent fever, chills, night sweats, or weight loss. She denies any chest pain, shortness of breath, cough, or hemoptysis. She denies any diarrhea or constipation. She denies any rashes or skin changes. She denies any headaches or visual changes. She is here for evaluation and for repeat labs.    MEDICAL HISTORY: Past Medical History:  Diagnosis Date  . Anemia   . Coronary artery disease    s/p stent to LAD and LCx  . DM II (diabetes mellitus, type II), controlled ([facility])   . DVT (deep venous thrombosis) ([facility])   . Elevated LFTs   . Hyperlipidemia   . Hypertension   . Myocardial infarction ([facility])    7 yrs ago    ALLERGIES:  is allergic to ampicillin; avelox [moxifloxacin hcl in nacl]; bactrim [sulfamethoxazole-trimethoprim]; ibuprofen; penicillins; codeine; and latex.  MEDICATIONS:  Current Outpatient Medications  Medication Sig Dispense Refill  . fenofibrate micronized (LOFIBRA) 200 MG capsule TAKE ONE CAPSULE BY MOUTH ONCE DAILY BEFORE BREAKFAST 90 capsule 2  . Fish  Oil-Cholecalciferol (FISH OIL + D3) 1000-1000 MG-UNIT CAPS Take 1 tablet by mouth 3 (three) times daily.    . hydroxyurea (HYDREA) 500 MG capsule Take 1 capsule (500 mg total) by mouth 2 (two) times daily. May take with food to minimize GI side effects. 60 capsule 0  . metFORMIN (GLUCOPHAGE) 500 MG tablet Take 500 mg by mouth 2 (two) times daily with a meal.      . metoprolol tartrate (LOPRESSOR) 25 MG tablet Take 1 tablet (25 mg total) by mouth 2 (two) times daily. 180 tablet 3  . nitroGLYCERIN (NITROSTAT) 0.4 MG SL tablet Place 1 tablet (0.4 mg total) under the tongue every 5 (five) minutes as needed. For chest pain. Please make yearly appt with Dr. Acie Fredrickson. 25 tablet 0  . prochlorperazine (COMPAZINE) 10 MG tablet Take 1 tablet (10 mg total) by mouth every 6 (six) hours as needed for nausea or vomiting. 30 tablet 2  . rivaroxaban (XARELTO) 20 MG TABS tablet Take 20 mg by mouth daily with supper.    . rosuvastatin (CRESTOR) 20 MG tablet Take 10 mg by mouth daily.     No current facility-administered medications for this visit.     SURGICAL HISTORY:  Past Surgical History:  Procedure Laterality Date  . CATARACT EXTRACTION    . CESAREAN SECTION  1983  . CORONARY ANGIOPLASTY WITH STENT PLACEMENT  04/26/2003   NORMAL. EF 65%  . I&D EXTREMITY  06/22/2012   Procedure: IRRIGATION AND DEBRIDEMENT EXTREMITY;  Surgeon: Tennis Must, MD;  Location: [facility];  Service: Orthopedics;  Laterality: Left;  . I&D EXTREMITY  06/29/2012  Procedure: IRRIGATION AND DEBRIDEMENT EXTREMITY;  Surgeon: Tennis Must, MD;  Location: [facility];  Service: Orthopedics;  Laterality: Left;  . OPEN REDUCTION INTERNAL FIXATION (ORIF) DISTAL RADIAL FRACTURE Right 02/04/2018   Procedure: OPEN REDUCTION INTERNAL FIXATION (ORIF) RIGHT DISTAL RADIAL FRACTURE;  Surgeon: Leanora Cover, MD;  Location: [facility];  Service: Orthopedics;  Laterality: Right;    REVIEW OF SYSTEMS:   Review of Systems  Constitutional: Negative for  appetite change, chills, fatigue, fever and unexpected weight change.  HENT: Negative for mouth sores, nosebleeds, sore throat and trouble swallowing.   Eyes: Negative for eye problems and icterus.  Respiratory: Negative for cough, hemoptysis, shortness of breath and wheezing.   Cardiovascular: Positive for chronic lower extremity edema. Negative for chest pain. Gastrointestinal: Positive for mild nausea. Negative for abdominal pain, constipation, diarrhea, and vomiting.  Genitourinary: Negative for bladder incontinence, difficulty urinating, dysuria, frequency and hematuria.   Musculoskeletal: Negative for back pain, gait problem, neck pain and neck stiffness.  Skin: Negative for itching and rash.  Neurological: Negative for dizziness, extremity weakness, gait problem, headaches, light-headedness and seizures.  Hematological: Negative for adenopathy. Does not bruise/bleed easily.  Psychiatric/Behavioral: Negative for confusion, depression and sleep disturbance. The patient is not nervous/anxious.     PHYSICAL EXAMINATION:  There were no vitals taken for this visit.  ECOG PERFORMANCE STATUS: 0 - Asymptomatic  Physical Exam  Constitutional: Oriented to person, place, and time and well-developed, well-nourished, and in no distress.  HENT:  Head: Normocephalic and atraumatic.  Mouth/Throat: Oropharynx is clear and moist. No oropharyngeal exudate.  Eyes: Conjunctivae are normal. Right eye exhibits no discharge. Left eye exhibits no discharge. No scleral icterus.  Neck: Normal range of motion. Neck supple.  Cardiovascular: Normal rate, regular rhythm, systolic murmur noted and intact distal pulses.   Pulmonary/Chest: Effort normal and breath sounds normal. No respiratory distress. No wheezes. No rales.  Abdominal: Soft. Bowel sounds are normal. Exhibits no distension and no mass. There is no tenderness.  Musculoskeletal: Positive for chronic lower extremity edema. Normal range of motion.   Lymphadenopathy:    No cervical adenopathy.  Neurological: Alert and oriented to person, place, and time. Exhibits normal muscle tone. Gait normal. Coordination normal.  Skin: Skin is warm and dry. No rash noted. Not diaphoretic. No erythema. No pallor.  Psychiatric: Mood, memory and judgment normal.  Vitals reviewed.  LABORATORY DATA: Lab Results  Component Value Date   WBC 10.9 (H) 10/05/2019   HGB 8.8 (L) 10/05/2019   HCT 29.0 (L) 10/05/2019   MCV 90.9 10/05/2019   PLT 841 (H) 10/05/2019      Chemistry      Component Value Date/Time   NA 143 10/05/2019 1107   K 4.1 10/05/2019 1107   CL 107 10/05/2019 1107   CO2 26 10/05/2019 1107   BUN 19 10/05/2019 1107   CREATININE 0.97 10/05/2019 1107      Component Value Date/Time   CALCIUM 8.9 10/05/2019 1107   ALKPHOS 45 10/05/2019 1107   AST 24 10/05/2019 1107   ALT 13 10/05/2019 1107   BILITOT 0.7 10/05/2019 1107       RADIOGRAPHIC STUDIES:  No results found.   ASSESSMENT/PLAN:  This is a very pleasant 73 years old white female recently diagnosed with essential thrombocythemia with positive JAK2 mutation.  The patient also has anemia and leukocytosis secondary to the myeloproliferative disorder. She was diagnosed in November of 2020 although she had evidence of this since approximately 2013-2014.  She is currently undergoing treatment with 500 mg p.o. daily or hydroxyurea. She started this on 09/24/2019 and is status post 1.5 weeks of treatment.   The patient was seen with Dr. Julien Nordmann. Labs were reviewed. Her platelet count is 841,000 today. Her hemoglobin shows stable but persistent anemia and her WBC count is improved to 10.9 today. Dr. Julien Nordmann recommends that the patient increase her dose to 1000 mg p.o. daily of hydroxyurea.   We will see the patient back for a follow up visit in 2 weeks for evaluation and repeat CBC.   I will send a prescription for compazine 10 mg p.o. every 6 hours as needed for nausea to her  pharmacy.    The patient was advised to call immediately if she has any concerning symptoms in the interval. The patient voices understanding of current disease status and treatment options and is in agreement with the current care plan. All questions were answered. The patient knows to call the clinic with any problems, questions or concerns. We can certainly see the patient much sooner if necessary  Orders Placed This Encounter  Procedures  . CBC with Differential (Cancer Center Only)    Standing Status:   Future    Standing Expiration Date:   10/04/2020  . CMP ([facility] only)    Standing Status:   Future    Standing Expiration Date:   10/04/2020  . Lactate dehydrogenase (LDH)    Standing Status:   Future    Standing Expiration Date:   10/04/2020     Tobe Sos Michell Kader, PA-C 10/05/19  ADDENDUM: Hematology/Oncology Attending: I had a face-to-face encounter with the patient today.  I recommended her care plan.  This is a very pleasant 73 years old white female recently diagnosed with essential thrombocythemia.  She is currently undergoing treatment with hydroxyurea 500 mg p.o. daily. The patient had repeat CBC today and that showed improvement in her platelets count but it continues to be elevated at 841,000. I had a discussion with the patient and her daughters today about her current condition and recommended for her to increase her dose of hydroxyurea to 1000 mg p.o. daily. We will continue to monitor her closely and repeat CBC, comprehensive metabolic panel and LDH in 2 weeks. For the nausea we will give her prescription for Compazine on as-needed basis. The patient was advised to call immediately if she has any concerning symptoms in the interval.  Disclaimer: This note was dictated with voice recognition software. Similar sounding words can inadvertently be transcribed and may be missed upon review. Eilleen Kempf, MD 10/05/19

## 2019-10-05 ENCOUNTER — Encounter: Payer: Self-pay | Admitting: Physician Assistant

## 2019-10-05 ENCOUNTER — Other Ambulatory Visit: Payer: Self-pay | Admitting: *Deleted

## 2019-10-05 ENCOUNTER — Inpatient Hospital Stay (HOSPITAL_BASED_OUTPATIENT_CLINIC_OR_DEPARTMENT_OTHER): Payer: Medicare Other | Admitting: Physician Assistant

## 2019-10-05 ENCOUNTER — Other Ambulatory Visit: Payer: Self-pay

## 2019-10-05 ENCOUNTER — Inpatient Hospital Stay: Payer: Medicare Other | Attending: Physician Assistant

## 2019-10-05 VITALS — BP 130/58 | HR 76 | Temp 98.9°F | Resp 18 | Ht 62.0 in | Wt 128.6 lb

## 2019-10-05 DIAGNOSIS — I10 Essential (primary) hypertension: Secondary | ICD-10-CM | POA: Diagnosis not present

## 2019-10-05 DIAGNOSIS — Z79899 Other long term (current) drug therapy: Secondary | ICD-10-CM | POA: Insufficient documentation

## 2019-10-05 DIAGNOSIS — D473 Essential (hemorrhagic) thrombocythemia: Secondary | ICD-10-CM | POA: Diagnosis not present

## 2019-10-05 DIAGNOSIS — D696 Thrombocytopenia, unspecified: Secondary | ICD-10-CM | POA: Diagnosis not present

## 2019-10-05 DIAGNOSIS — I2510 Atherosclerotic heart disease of native coronary artery without angina pectoris: Secondary | ICD-10-CM

## 2019-10-05 DIAGNOSIS — Z7901 Long term (current) use of anticoagulants: Secondary | ICD-10-CM | POA: Diagnosis not present

## 2019-10-05 DIAGNOSIS — E119 Type 2 diabetes mellitus without complications: Secondary | ICD-10-CM | POA: Insufficient documentation

## 2019-10-05 DIAGNOSIS — Z86718 Personal history of other venous thrombosis and embolism: Secondary | ICD-10-CM | POA: Diagnosis not present

## 2019-10-05 DIAGNOSIS — R110 Nausea: Secondary | ICD-10-CM | POA: Diagnosis not present

## 2019-10-05 DIAGNOSIS — D649 Anemia, unspecified: Secondary | ICD-10-CM | POA: Diagnosis not present

## 2019-10-05 DIAGNOSIS — I252 Old myocardial infarction: Secondary | ICD-10-CM | POA: Insufficient documentation

## 2019-10-05 DIAGNOSIS — Z7984 Long term (current) use of oral hypoglycemic drugs: Secondary | ICD-10-CM | POA: Diagnosis not present

## 2019-10-05 DIAGNOSIS — E785 Hyperlipidemia, unspecified: Secondary | ICD-10-CM | POA: Insufficient documentation

## 2019-10-05 LAB — CMP (CANCER CENTER ONLY)
ALT: 13 U/L (ref 0–44)
AST: 24 U/L (ref 15–41)
Albumin: 4.1 g/dL (ref 3.5–5.0)
Alkaline Phosphatase: 45 U/L (ref 38–126)
Anion gap: 10 (ref 5–15)
BUN: 19 mg/dL (ref 8–23)
CO2: 26 mmol/L (ref 22–32)
Calcium: 8.9 mg/dL (ref 8.9–10.3)
Chloride: 107 mmol/L (ref 98–111)
Creatinine: 0.97 mg/dL (ref 0.44–1.00)
GFR, Est AFR Am: >60 mL/min (ref >60)
GFR, Estimated: 58 mL/min — ABNORMAL LOW (ref >60)
Glucose, Bld: 162 mg/dL — ABNORMAL HIGH (ref 70–99)
Potassium: 4.1 mmol/L (ref 3.5–5.1)
Sodium: 143 mmol/L (ref 135–145)
Total Bilirubin: 0.7 mg/dL (ref 0.3–1.2)
Total Protein: 6.1 g/dL — ABNORMAL LOW (ref 6.5–8.1)

## 2019-10-05 LAB — CBC WITH DIFFERENTIAL (CANCER CENTER ONLY)
Abs Immature Granulocytes: 0.24 10*3/uL — ABNORMAL HIGH (ref 0.00–0.07)
Basophils Absolute: 0.1 10*3/uL (ref 0.0–0.1)
Basophils Relative: 1 %
Eosinophils Absolute: 0.2 10*3/uL (ref 0.0–0.5)
Eosinophils Relative: 2 %
HCT: 29 % — ABNORMAL LOW (ref 36.0–46.0)
Hemoglobin: 8.8 g/dL — ABNORMAL LOW (ref 12.0–15.0)
Immature Granulocytes: 2 %
Lymphocytes Relative: 9 %
Lymphs Abs: 1 10*3/uL (ref 0.7–4.0)
MCH: 27.6 pg (ref 26.0–34.0)
MCHC: 30.3 g/dL (ref 30.0–36.0)
MCV: 90.9 fL (ref 80.0–100.0)
Monocytes Absolute: 0.4 10*3/uL (ref 0.1–1.0)
Monocytes Relative: 4 %
Neutro Abs: 9 10*3/uL — ABNORMAL HIGH (ref 1.7–7.7)
Neutrophils Relative %: 82 %
Platelet Count: 841 10*3/uL — ABNORMAL HIGH (ref 150–400)
RBC: 3.19 MIL/uL — ABNORMAL LOW (ref 3.87–5.11)
RDW: 28.5 % — ABNORMAL HIGH (ref 11.5–15.5)
WBC Count: 10.9 10*3/uL — ABNORMAL HIGH (ref 4.0–10.5)
nRBC: 0.6 % — ABNORMAL HIGH (ref 0.0–0.2)

## 2019-10-05 LAB — LACTATE DEHYDROGENASE: LDH: 529 U/L — ABNORMAL HIGH (ref 98–192)

## 2019-10-05 MED ORDER — HYDROXYUREA 500 MG PO CAPS: 500.0000 mg | ORAL_CAPSULE | Freq: Two times a day (BID) | ORAL | 0 refills | Status: DC

## 2019-10-05 MED ORDER — PROCHLORPERAZINE MALEATE 10 MG PO TABS: 10.0000 mg | ORAL_TABLET | Freq: Four times a day (QID) | ORAL | 2 refills | Status: DC | PRN

## 2019-10-19 ENCOUNTER — Other Ambulatory Visit: Payer: Self-pay

## 2019-10-19 ENCOUNTER — Encounter: Payer: Self-pay | Admitting: Internal Medicine

## 2019-10-19 ENCOUNTER — Telehealth: Payer: Self-pay | Admitting: Internal Medicine

## 2019-10-19 ENCOUNTER — Inpatient Hospital Stay (HOSPITAL_BASED_OUTPATIENT_CLINIC_OR_DEPARTMENT_OTHER): Payer: Medicare Other | Admitting: Internal Medicine

## 2019-10-19 ENCOUNTER — Inpatient Hospital Stay: Payer: Medicare Other

## 2019-10-19 VITALS — BP 128/43 | HR 68 | Temp 98.2°F | Resp 18 | Ht 62.0 in | Wt 127.7 lb

## 2019-10-19 DIAGNOSIS — R110 Nausea: Secondary | ICD-10-CM | POA: Diagnosis not present

## 2019-10-19 DIAGNOSIS — E785 Hyperlipidemia, unspecified: Secondary | ICD-10-CM | POA: Diagnosis not present

## 2019-10-19 DIAGNOSIS — E119 Type 2 diabetes mellitus without complications: Secondary | ICD-10-CM | POA: Diagnosis not present

## 2019-10-19 DIAGNOSIS — D473 Essential (hemorrhagic) thrombocythemia: Secondary | ICD-10-CM

## 2019-10-19 DIAGNOSIS — Z5111 Encounter for antineoplastic chemotherapy: Secondary | ICD-10-CM | POA: Diagnosis not present

## 2019-10-19 DIAGNOSIS — I10 Essential (primary) hypertension: Secondary | ICD-10-CM | POA: Diagnosis not present

## 2019-10-19 DIAGNOSIS — I2510 Atherosclerotic heart disease of native coronary artery without angina pectoris: Secondary | ICD-10-CM | POA: Diagnosis not present

## 2019-10-19 DIAGNOSIS — D649 Anemia, unspecified: Secondary | ICD-10-CM | POA: Diagnosis not present

## 2019-10-19 LAB — CMP (CANCER CENTER ONLY)
ALT: 7 U/L (ref 0–44)
AST: 17 U/L (ref 15–41)
Albumin: 4.3 g/dL (ref 3.5–5.0)
Alkaline Phosphatase: 39 U/L (ref 38–126)
Anion gap: 11 (ref 5–15)
BUN: 20 mg/dL (ref 8–23)
CO2: 26 mmol/L (ref 22–32)
Calcium: 9.3 mg/dL (ref 8.9–10.3)
Chloride: 105 mmol/L (ref 98–111)
Creatinine: 0.91 mg/dL (ref 0.44–1.00)
GFR, Est AFR Am: >60 mL/min
GFR, Estimated: >60 mL/min
Glucose, Bld: 136 mg/dL — ABNORMAL HIGH (ref 70–99)
Potassium: 4.3 mmol/L (ref 3.5–5.1)
Sodium: 142 mmol/L (ref 135–145)
Total Bilirubin: 0.8 mg/dL (ref 0.3–1.2)
Total Protein: 6.6 g/dL (ref 6.5–8.1)

## 2019-10-19 LAB — CBC WITH DIFFERENTIAL (CANCER CENTER ONLY)
Abs Immature Granulocytes: 0.06 10*3/uL (ref 0.00–0.07)
Basophils Absolute: 0.1 10*3/uL (ref 0.0–0.1)
Basophils Relative: 1 %
Eosinophils Absolute: 0.1 10*3/uL (ref 0.0–0.5)
Eosinophils Relative: 2 %
HCT: 27.4 % — ABNORMAL LOW (ref 36.0–46.0)
Hemoglobin: 8.5 g/dL — ABNORMAL LOW (ref 12.0–15.0)
Immature Granulocytes: 1 %
Lymphocytes Relative: 8 %
Lymphs Abs: 0.6 10*3/uL — ABNORMAL LOW (ref 0.7–4.0)
MCH: 28.3 pg (ref 26.0–34.0)
MCHC: 31 g/dL (ref 30.0–36.0)
MCV: 91.3 fL (ref 80.0–100.0)
Monocytes Absolute: 0.2 10*3/uL (ref 0.1–1.0)
Monocytes Relative: 2 %
Neutro Abs: 6.3 10*3/uL (ref 1.7–7.7)
Neutrophils Relative %: 86 %
Platelet Count: 599 10*3/uL — ABNORMAL HIGH (ref 150–400)
RBC: 3 MIL/uL — ABNORMAL LOW (ref 3.87–5.11)
RDW: 28.5 % — ABNORMAL HIGH (ref 11.5–15.5)
WBC Count: 7.3 10*3/uL (ref 4.0–10.5)
nRBC: 0.3 % — ABNORMAL HIGH (ref 0.0–0.2)

## 2019-10-19 LAB — LACTATE DEHYDROGENASE: LDH: 321 U/L — ABNORMAL HIGH (ref 98–192)

## 2019-10-19 NOTE — Progress Notes (Signed)
[facility] [phone]   [phone]  OFFICE PROGRESS NOTE  [name], MD [place] [address]  DIAGNOSIS: Essential thrombocythemia with positive JAK2 mutation V617F diagnosed in October 2020.  PRIOR THERAPY: None  CURRENT THERAPY: Hydroxyurea 1000 mg p.o. daily.   INTERVAL HISTORY: [name] [name] 73 y.o. female returns to the clinic today for follow-up visit accompanied by her daughter [name] [name].  The patient is feeling fine today with no concerning complaints except for mild fatigue as well as intermittent right lower quadrant and hip pain.  She is using Aspercreme and it is helping.  She denied having any current chest pain, shortness of breath, cough or hemoptysis.  She denied having any fever or chills.  She has no nausea, vomiting, diarrhea or constipation.  She denied having any bleeding, bruises or ecchymosis.  She has been tolerating her treatment with hydroxyurea 1000 mg p.o. daily fairly well.  The patient is here today for evaluation and repeat blood work.   MEDICAL HISTORY: Past Medical History:  Diagnosis Date  . Anemia   . Coronary artery disease    s/p stent to LAD and LCx  . DM II (diabetes mellitus, type II), controlled ([facility])   . DVT (deep venous thrombosis) ([facility])   . Elevated LFTs   . Hyperlipidemia   . Hypertension   . Myocardial infarction ([facility])    7 yrs ago    ALLERGIES:  is allergic to ampicillin; avelox [moxifloxacin hcl in nacl]; bactrim [sulfamethoxazole-trimethoprim]; ibuprofen; penicillins; codeine; and latex.  MEDICATIONS:  Current Outpatient Medications  Medication Sig Dispense Refill  . fenofibrate micronized (LOFIBRA) 200 MG capsule TAKE ONE CAPSULE BY MOUTH ONCE DAILY BEFORE BREAKFAST 90 capsule 2  . Fish Oil-Cholecalciferol (FISH OIL + D3) 1000-1000 MG-UNIT CAPS Take 1 tablet by mouth 3 (three) times daily.    . hydroxyurea (HYDREA) 500 MG capsule Take 1 capsule (500 mg total) by mouth 2  (two) times daily. May take with food to minimize GI side effects. 60 capsule 0  . metFORMIN (GLUCOPHAGE) 500 MG tablet Take 500 mg by mouth 2 (two) times daily with a meal.      . metoprolol tartrate (LOPRESSOR) 25 MG tablet Take 1 tablet (25 mg total) by mouth 2 (two) times daily. 180 tablet 3  . nitroGLYCERIN (NITROSTAT) 0.4 MG SL tablet Place 1 tablet (0.4 mg total) under the tongue every 5 (five) minutes as needed. For chest pain. Please make yearly appt with Dr. Acie Fredrickson. 25 tablet 0  . prochlorperazine (COMPAZINE) 10 MG tablet Take 1 tablet (10 mg total) by mouth every 6 (six) hours as needed for nausea or vomiting. 30 tablet 2  . rivaroxaban (XARELTO) 20 MG TABS tablet Take 20 mg by mouth daily with supper.    . rosuvastatin (CRESTOR) 20 MG tablet Take 10 mg by mouth daily.     No current facility-administered medications for this visit.    SURGICAL HISTORY:  Past Surgical History:  Procedure Laterality Date  . CATARACT EXTRACTION    . CESAREAN SECTION  1983  . CORONARY ANGIOPLASTY WITH STENT PLACEMENT  04/26/2003   NORMAL. EF 65%  . I&D EXTREMITY  06/22/2012   Procedure: IRRIGATION AND DEBRIDEMENT EXTREMITY;  Surgeon: Tennis Must, MD;  Location: [facility];  Service: Orthopedics;  Laterality: Left;  . I&D EXTREMITY  06/29/2012   Procedure: IRRIGATION AND DEBRIDEMENT EXTREMITY;  Surgeon: Tennis Must, MD;  Location: [facility];  Service: Orthopedics;  Laterality: Left;  . OPEN REDUCTION INTERNAL FIXATION (ORIF) DISTAL RADIAL FRACTURE Right 02/04/2018   Procedure: OPEN REDUCTION INTERNAL FIXATION (ORIF) RIGHT DISTAL RADIAL FRACTURE;  Surgeon: Leanora Cover, MD;  Location: [facility];  Service: Orthopedics;  Laterality: Right;    REVIEW OF SYSTEMS:  A comprehensive review of systems was negative except for: Constitutional: positive for fatigue Musculoskeletal: positive for arthralgias   PHYSICAL EXAMINATION: General appearance: alert, cooperative, fatigued and no distress Head:  Normocephalic, without obvious abnormality, atraumatic Neck: no adenopathy, no JVD, supple, symmetrical, trachea midline and thyroid not enlarged, symmetric, no tenderness/mass/nodules Lymph nodes: Cervical, supraclavicular, and axillary nodes normal. Resp: clear to auscultation bilaterally Back: symmetric, no curvature. ROM normal. No CVA tenderness. Cardio: regular rate and rhythm, S1, S2 normal, no murmur, click, rub or gallop GI: soft, non-tender; bowel sounds normal; no masses,  no organomegaly Extremities: extremities normal, atraumatic, no cyanosis or edema  ECOG PERFORMANCE STATUS: 1 - Symptomatic but completely ambulatory  Blood pressure (!) 128/43, pulse 68, temperature 98.2 F (36.8 C), temperature source Temporal, resp. rate 18, height 5\' 2"  (1.575 m), weight 127 lb 11.2 oz (57.9 kg), SpO2 100 %.  LABORATORY DATA: Lab Results  Component Value Date   WBC 7.3 10/19/2019   HGB 8.5 (L) 10/19/2019   HCT 27.4 (L) 10/19/2019   MCV 91.3 10/19/2019   PLT 599 (H) 10/19/2019      Chemistry      Component Value Date/Time   NA 142 10/19/2019 1112   K 4.3 10/19/2019 1112   CL 105 10/19/2019 1112   CO2 26 10/19/2019 1112   BUN 20 10/19/2019 1112   CREATININE 0.91 10/19/2019 1112      Component Value Date/Time   CALCIUM 9.3 10/19/2019 1112   ALKPHOS 39 10/19/2019 1112   AST 17 10/19/2019 1112   ALT 7 10/19/2019 1112   BILITOT 0.8 10/19/2019 1112       RADIOGRAPHIC STUDIES: No results found.  ASSESSMENT AND PLAN: This is a very pleasant 73 years old white female recently diagnosed with essential thrombocythemia with positive JAK2 mutation.  The patient also has anemia and leukocytosis secondary to the myeloproliferative disorder. The patient is currently undergoing treatment with hydroxyurea initially started at a dose of 500 mg p.o. daily but this was increased in the last 2 weeks to 1000 mg p.o. daily.  She is tolerating her treatment well.  She has improvement in her  platelets count down to 599,000 in addition to improvement of her total white blood count. The patient continues to have anemia. I recommended for her to continue her current treatment with hydroxyurea with the same dose 1000 mg p.o. daily. For the anemia she will start taking oral iron tablets at regular basis. We will see her back for follow-up visit in 2 weeks for evaluation and repeat CBC, comprehensive metabolic panel and LDH. The patient was advised to call immediately if she has any other concerning symptoms in the interval. The patient voices understanding of current disease status and treatment options and is in agreement with the current care plan.  All questions were answered. The patient knows to call the clinic with any problems, questions or concerns. We can certainly see the patient much sooner if necessary.  I spent 10 minutes counseling the patient face to face. The total time spent in the appointment was 15 minutes.  Disclaimer: This note was dictated with voice recognition software. Similar sounding words can inadvertently be transcribed and may not be corrected  upon review.

## 2019-10-19 NOTE — Telephone Encounter (Signed)
Scheduled appt per 12/16 los.  Printed calendar and avs. 

## 2019-11-02 NOTE — Progress Notes (Signed)
[facility] OFFICE PROGRESS NOTE  [name], [place] [address]  DIAGNOSIS: Essential thrombocythemia with positive JAK2 mutation [id number] diagnosed in October 2020.  PRIOR THERAPY: None  CURRENT THERAPY: Hydroxyurea 500 mg p.o. daily. First dose on 09/24/2019. Increased to 1000 mg p.o. daily of hydroxyurea on 10/05/2019. Reduced to 500 mg p.o. daily on 11/03/2019.  INTERVAL HISTORY: [name] [name] 73 y.o. female returns to the clinic today for a follow-up visit accompanied by her daughter, [name]. She is tolerating this fairly well without any concerning side effects. She denies nausea or vomiting. She denies any recent fever, chills, night sweats, or weight loss. She denies any chest pain, shortness of breath, cough, or hemoptysis. She denies any diarrhea or constipation. She denies any rashes or skin changes. She denies any headaches or visual changes. She denies any usual fatigue from her baseline. The patient is taking her supplemental iron as prescribed. She mailed her fecal occult stool cards to the clinic as instructed to further evaluate her anemia. These are presently not recorded in the system. She is not up to date on her routine colonoscopies since she is very reluctant to have this done. Her PCP has been trying to encourage her to have her colonoscopy for many years. She is here for evaluation and for repeat labs.   MEDICAL HISTORY: Past Medical History:  Diagnosis Date  . Anemia   . Coronary artery disease    s/p stent to LAD and LCx  . DM II (diabetes mellitus, type II), controlled ([facility])   . DVT (deep venous thrombosis) ([facility])   . Elevated LFTs   . Hyperlipidemia   . Hypertension   . Myocardial infarction ([place])    7 yrs ago    ALLERGIES:  is allergic to ampicillin; avelox [moxifloxacin hcl in nacl]; bactrim [sulfamethoxazole-trimethoprim]; ibuprofen; penicillins; codeine; and latex.  MEDICATIONS:  Current Outpatient Medications   Medication Sig Dispense Refill  . fenofibrate micronized (LOFIBRA) 200 MG capsule TAKE ONE CAPSULE BY MOUTH ONCE DAILY BEFORE BREAKFAST 90 capsule 2  . ferrous sulfate 325 (65 FE) MG EC tablet Take 325 mg by mouth daily.    . Fish Oil-Cholecalciferol (FISH OIL + D3) 1000-1000 MG-UNIT CAPS Take 1 tablet by mouth 3 (three) times daily.    . hydroxyurea (HYDREA) 500 MG capsule Take 1 capsule (500 mg total) by mouth daily. May take with food to minimize GI side effects. 30 capsule 3  . metFORMIN (GLUCOPHAGE) 500 MG tablet Take 500 mg by mouth 2 (two) times daily with a meal.      . prochlorperazine (COMPAZINE) 10 MG tablet Take 1 tablet (10 mg total) by mouth every 6 (six) hours as needed for nausea or vomiting. 30 tablet 2  . rivaroxaban (XARELTO) 20 MG TABS tablet Take 20 mg by mouth daily with supper.    . rosuvastatin (CRESTOR) 20 MG tablet Take 10 mg by mouth daily.    . metoprolol tartrate (LOPRESSOR) 25 MG tablet Take 1 tablet (25 mg total) by mouth 2 (two) times daily. 180 tablet 3  . nitroGLYCERIN (NITROSTAT) 0.4 MG SL tablet Place 1 tablet (0.4 mg total) under the tongue every 5 (five) minutes as needed. For chest pain. Please make yearly appt with Dr. Acie Fredrickson. (Patient not taking: Reported on 11/03/2019) 25 tablet 0   No current facility-administered medications for this visit.    SURGICAL HISTORY:  Past Surgical History:  Procedure Laterality Date  . CATARACT EXTRACTION    .  CESAREAN SECTION  1983  . CORONARY ANGIOPLASTY WITH STENT PLACEMENT  04/26/2003   NORMAL. EF 65%  . I & D EXTREMITY  06/22/2012   Procedure: IRRIGATION AND DEBRIDEMENT EXTREMITY;  Surgeon: Tennis Must, MD;  Location: [facility];  Service: Orthopedics;  Laterality: Left;  . I & D EXTREMITY  06/29/2012   Procedure: IRRIGATION AND DEBRIDEMENT EXTREMITY;  Surgeon: Tennis Must, MD;  Location: [facility];  Service: Orthopedics;  Laterality: Left;  . OPEN REDUCTION INTERNAL FIXATION (ORIF) DISTAL RADIAL FRACTURE Right 02/04/2018    Procedure: OPEN REDUCTION INTERNAL FIXATION (ORIF) RIGHT DISTAL RADIAL FRACTURE;  Surgeon: Leanora Cover, MD;  Location: [facility];  Service: Orthopedics;  Laterality: Right;    REVIEW OF SYSTEMS:   Review of Systems  Constitutional: Negative for appetite change, chills, fatigue, fever and unexpected weight change.  HENT: Negative for mouth sores, nosebleeds, sore throat and trouble swallowing.   Eyes: Negative for eye problems and icterus.  Respiratory: Negative for cough, hemoptysis, shortness of breath and wheezing.  Cardiovascular: Negative for chest pain and leg swelling.  Gastrointestinal: Negative for abdominal pain, constipation, diarrhea, nausea and vomiting.  Genitourinary: Negative for bladder incontinence, difficulty urinating, dysuria, frequency and hematuria.   Musculoskeletal: Negative for back pain, gait problem, neck pain and neck stiffness.  Skin: Negative for itching and rash.  Neurological: Negative for dizziness, extremity weakness, gait problem, headaches, light-headedness and seizures.  Hematological: Negative for adenopathy. Does not bruise/bleed easily.  Psychiatric/Behavioral: Negative for confusion, depression and sleep disturbance. The patient is not nervous/anxious.     PHYSICAL EXAMINATION:  Blood pressure (!) 122/34, pulse 78, temperature 98.7 F (37.1 C), temperature source Temporal, resp. rate 18, height 5\' 2"  (1.575 m), weight 126 lb 12.8 oz (57.5 kg), SpO2 99 %.  ECOG PERFORMANCE STATUS: 0 - Asymptomatic  Physical Exam  Constitutional: Oriented to person, place, and time and well-developed, well-nourished, and in no distress. No distress.  HENT:  Head: Normocephalic and atraumatic.  Mouth/Throat: Oropharynx is clear and moist. No oropharyngeal exudate.  Eyes: Conjunctivae are normal. Right eye exhibits no discharge. Left eye exhibits no discharge. No scleral icterus.  Neck: Normal range of motion. Neck supple.  Cardiovascular:  Normal rate, regular rhythm, systolic murmur noted and intact distal pulses.   Pulmonary/Chest: Effort normal and breath sounds normal. No respiratory distress. No wheezes. No rales.  Abdominal: Soft. Bowel sounds are normal. Exhibits no distension and no mass. There is no tenderness.  Musculoskeletal: Normal range of motion. Exhibits no edema.  Lymphadenopathy:    No cervical adenopathy.  Neurological: Alert and oriented to person, place, and time. Exhibits normal muscle tone. Gait normal. Coordination normal.  Skin: Skin is warm and dry. No rash noted. Not diaphoretic. No erythema. No pallor.  Psychiatric: Mood, memory and judgment normal.  Vitals reviewed.  LABORATORY DATA: Lab Results  Component Value Date   WBC 3.7 (L) 11/03/2019   HGB 6.6 (LL) 11/03/2019   HCT 21.0 (L) 11/03/2019   MCV 91.3 11/03/2019   PLT 419 (H) 11/03/2019      Chemistry      Component Value Date/Time   NA 141 11/03/2019 1100   K 4.2 11/03/2019 1100   CL 107 11/03/2019 1100   CO2 26 11/03/2019 1100   BUN 20 11/03/2019 1100   CREATININE 0.82 11/03/2019 1100      Component Value Date/Time   CALCIUM 8.8 (L) 11/03/2019 1100   ALKPHOS 29 (L) 11/03/2019 1100   AST 16 11/03/2019 1100  ALT 6 11/03/2019 1100   BILITOT 0.6 11/03/2019 1100       RADIOGRAPHIC STUDIES:  No results found.   ASSESSMENT/PLAN:  This is a very pleasant 73 years old white female recently diagnosed with essential thrombocythemia with positive JAK2 mutation. The patient also has anemia and leukocytosis secondary to the myeloproliferative disorder. She was diagnosed in November of 2020 although she had evidence of this since approximately 2013-2014.    She is currently undergoing treatment with 500 mg p.o. daily or hydroxyurea. She started this on 09/24/2019 and is status post 1.5 weeks of treatment. This was increased to 1000 mg daily starting on 10/05/2019  The patient was seen with Dr. Julien Nordmann. Labs were reviewed. Her  platelet count is improved to 419k today. However, her hemoglobin has dropped significantly to 6.6 today. Dr. Julien Nordmann and I discussed the need for a blood transfusion. The patient will return to the clinic tomorrow and get two units of RBCs. She still does not wish to undergo a colonoscopy. Therefore, we will have the patient redo her fecal occult stool cards to assess for GI blood loss.   Dr. Julien Nordmann recommends that the patient reduce her dose of hydroxyurea to 500 mg p.o. daily. I have sent a refill to her pharmacy.   We will see the patient back for a follow up visit in 2 weeks for evaluation and repeat blood work.   The patient was advised to call immediately if she has any concerning symptoms in the interval. The patient voices understanding of current disease status and treatment options and is in agreement with the current care plan. All questions were answered. The patient knows to call the clinic with any problems, questions or concerns. We can certainly see the patient much sooner if necessary   Orders Placed This Encounter  Procedures  . Occult blood card to lab, stool  . CBC with Differential (Cancer Center Only)    Standing Status:   Future    Standing Expiration Date:   11/02/2020  . CMP ([facility] only)    Standing Status:   Future    Standing Expiration Date:   11/02/2020  . Lactate dehydrogenase (LDH)    Standing Status:   Future    Standing Expiration Date:   11/02/2020  . Practitioner attestation of consent    I, the ordering practitioner, attest that I have discussed with the patient the benefits, risks, side effects, alternatives, likelihood of achieving goals and potential problems during recovery for the procedure listed.    Standing Status:   Future    Standing Expiration Date:   11/02/2020    Order Specific Question:   Procedure    Answer:   Blood Product(s)  . Complete patient signature process for consent form    Standing Status:   Future    Standing  Expiration Date:   11/02/2020  . Care order/instruction    Transfuse Parameters    Standing Status:   Future    Standing Expiration Date:   11/02/2020  . POCT occult blood stool  . Type and screen    Standing Status:   Future    Number of Occurrences:   1    Standing Expiration Date:   11/02/2020     Tobe Sos Christiano Blandon, PA-C 11/03/19  ADDENDUM: Hematology/Oncology Attending: I had a face-to-face encounter with the patient.  I recommended her care plan.  This is a 73 years old white female with essential thrombocythemia with positive JAK2 mutation.  The  patient is currently on treatment with hydroxyurea initially started at 500 mg p.o. daily increased to 1000 mg p.o. daily 2 weeks ago because of the persistent thrombocytosis.  The patient is feeling fine today with no concerning complaints except for fatigue. She had repeat CBC today that showed improvement in her total white blood count as well as the platelets count but there was significant drop in her hemoglobin and hematocrit to 6.6/21.0%. I had a lengthy discussion with the patient and her daughter today about her condition. I recommended for her to decrease the dose of hydroxyurea back to 500 mg p.o. daily. For the severe anemia, will arrange for the patient to receive 2 units of PRBCs transfusion tomorrow. We will see the patient back for follow-up visit in 2 weeks for reevaluation and further recommendation regarding her condition. She was advised to call immediately if she has any concerning symptoms in the interval.  Disclaimer: This note was dictated with voice recognition software. Similar sounding words can inadvertently be transcribed and may be missed upon review. Eilleen Kempf, MD 11/03/19

## 2019-11-03 ENCOUNTER — Other Ambulatory Visit: Payer: Self-pay | Admitting: Physician Assistant

## 2019-11-03 ENCOUNTER — Encounter: Payer: Self-pay | Admitting: Physician Assistant

## 2019-11-03 ENCOUNTER — Inpatient Hospital Stay (HOSPITAL_BASED_OUTPATIENT_CLINIC_OR_DEPARTMENT_OTHER): Payer: Medicare Other | Admitting: Physician Assistant

## 2019-11-03 ENCOUNTER — Inpatient Hospital Stay: Payer: Medicare Other

## 2019-11-03 ENCOUNTER — Other Ambulatory Visit: Payer: Self-pay

## 2019-11-03 VITALS — BP 122/34 | HR 78 | Temp 98.7°F | Resp 18 | Ht 62.0 in | Wt 126.8 lb

## 2019-11-03 DIAGNOSIS — D473 Essential (hemorrhagic) thrombocythemia: Secondary | ICD-10-CM

## 2019-11-03 DIAGNOSIS — I10 Essential (primary) hypertension: Secondary | ICD-10-CM | POA: Diagnosis not present

## 2019-11-03 DIAGNOSIS — R110 Nausea: Secondary | ICD-10-CM | POA: Diagnosis not present

## 2019-11-03 DIAGNOSIS — E785 Hyperlipidemia, unspecified: Secondary | ICD-10-CM | POA: Diagnosis not present

## 2019-11-03 DIAGNOSIS — E119 Type 2 diabetes mellitus without complications: Secondary | ICD-10-CM | POA: Diagnosis not present

## 2019-11-03 DIAGNOSIS — D649 Anemia, unspecified: Secondary | ICD-10-CM

## 2019-11-03 DIAGNOSIS — I2510 Atherosclerotic heart disease of native coronary artery without angina pectoris: Secondary | ICD-10-CM

## 2019-11-03 DIAGNOSIS — Z5111 Encounter for antineoplastic chemotherapy: Secondary | ICD-10-CM | POA: Diagnosis not present

## 2019-11-03 LAB — CMP (CANCER CENTER ONLY)
ALT: 6 U/L (ref 0–44)
AST: 16 U/L (ref 15–41)
Albumin: 4 g/dL (ref 3.5–5.0)
Alkaline Phosphatase: 29 U/L — ABNORMAL LOW (ref 38–126)
Anion gap: 8 (ref 5–15)
BUN: 20 mg/dL (ref 8–23)
CO2: 26 mmol/L (ref 22–32)
Calcium: 8.8 mg/dL — ABNORMAL LOW (ref 8.9–10.3)
Chloride: 107 mmol/L (ref 98–111)
Creatinine: 0.82 mg/dL (ref 0.44–1.00)
GFR, Est AFR Am: >60 mL/min (ref >60)
GFR, Estimated: >60 mL/min (ref >60)
Glucose, Bld: 89 mg/dL (ref 70–99)
Potassium: 4.2 mmol/L (ref 3.5–5.1)
Sodium: 141 mmol/L (ref 135–145)
Total Bilirubin: 0.6 mg/dL (ref 0.3–1.2)
Total Protein: 6 g/dL — ABNORMAL LOW (ref 6.5–8.1)

## 2019-11-03 LAB — CBC WITH DIFFERENTIAL (CANCER CENTER ONLY)
Abs Immature Granulocytes: 0.03 10*3/uL (ref 0.00–0.07)
Basophils Absolute: 0 10*3/uL (ref 0.0–0.1)
Basophils Relative: 1 %
Eosinophils Absolute: 0.1 10*3/uL (ref 0.0–0.5)
Eosinophils Relative: 2 %
HCT: 21 % — ABNORMAL LOW (ref 36.0–46.0)
Hemoglobin: 6.6 g/dL — CL (ref 12.0–15.0)
Immature Granulocytes: 1 %
Lymphocytes Relative: 17 %
Lymphs Abs: 0.6 10*3/uL — ABNORMAL LOW (ref 0.7–4.0)
MCH: 28.7 pg (ref 26.0–34.0)
MCHC: 31.4 g/dL (ref 30.0–36.0)
MCV: 91.3 fL (ref 80.0–100.0)
Monocytes Absolute: 0.2 10*3/uL (ref 0.1–1.0)
Monocytes Relative: 5 %
Neutro Abs: 2.8 10*3/uL (ref 1.7–7.7)
Neutrophils Relative %: 74 %
Platelet Count: 419 10*3/uL — ABNORMAL HIGH (ref 150–400)
RBC: 2.3 MIL/uL — ABNORMAL LOW (ref 3.87–5.11)
RDW: 28.9 % — ABNORMAL HIGH (ref 11.5–15.5)
WBC Count: 3.7 10*3/uL — ABNORMAL LOW (ref 4.0–10.5)
nRBC: 0.5 % — ABNORMAL HIGH (ref 0.0–0.2)

## 2019-11-03 LAB — LACTATE DEHYDROGENASE: LDH: 228 U/L — ABNORMAL HIGH (ref 98–192)

## 2019-11-03 LAB — ABO/RH: ABO/RH(D): A POS

## 2019-11-03 MED ORDER — HYDROXYUREA 500 MG PO CAPS: 500.0000 mg | ORAL_CAPSULE | Freq: Every day | ORAL | 3 refills | Status: DC

## 2019-11-03 NOTE — Progress Notes (Signed)
Hemoccult Card given to patient to be returned via mail to Lab

## 2019-11-04 ENCOUNTER — Inpatient Hospital Stay: Payer: Medicare Other | Attending: Physician Assistant

## 2019-11-04 DIAGNOSIS — Z7984 Long term (current) use of oral hypoglycemic drugs: Secondary | ICD-10-CM | POA: Diagnosis not present

## 2019-11-04 DIAGNOSIS — R5383 Other fatigue: Secondary | ICD-10-CM | POA: Insufficient documentation

## 2019-11-04 DIAGNOSIS — I252 Old myocardial infarction: Secondary | ICD-10-CM | POA: Diagnosis not present

## 2019-11-04 DIAGNOSIS — Z79899 Other long term (current) drug therapy: Secondary | ICD-10-CM | POA: Diagnosis not present

## 2019-11-04 DIAGNOSIS — I2510 Atherosclerotic heart disease of native coronary artery without angina pectoris: Secondary | ICD-10-CM | POA: Diagnosis not present

## 2019-11-04 DIAGNOSIS — D473 Essential (hemorrhagic) thrombocythemia: Secondary | ICD-10-CM | POA: Insufficient documentation

## 2019-11-04 DIAGNOSIS — D649 Anemia, unspecified: Secondary | ICD-10-CM | POA: Insufficient documentation

## 2019-11-04 DIAGNOSIS — D72829 Elevated white blood cell count, unspecified: Secondary | ICD-10-CM | POA: Insufficient documentation

## 2019-11-04 DIAGNOSIS — Z86718 Personal history of other venous thrombosis and embolism: Secondary | ICD-10-CM | POA: Diagnosis not present

## 2019-11-04 DIAGNOSIS — Z7901 Long term (current) use of anticoagulants: Secondary | ICD-10-CM | POA: Insufficient documentation

## 2019-11-04 DIAGNOSIS — E119 Type 2 diabetes mellitus without complications: Secondary | ICD-10-CM | POA: Insufficient documentation

## 2019-11-04 DIAGNOSIS — I10 Essential (primary) hypertension: Secondary | ICD-10-CM | POA: Diagnosis not present

## 2019-11-04 DIAGNOSIS — E785 Hyperlipidemia, unspecified: Secondary | ICD-10-CM | POA: Diagnosis not present

## 2019-11-04 LAB — PREPARE RBC (CROSSMATCH)

## 2019-11-04 MED ORDER — DIPHENHYDRAMINE HCL 25 MG PO CAPS
ORAL_CAPSULE | ORAL | Status: AC
  Filled 2019-11-04: qty 1

## 2019-11-04 MED ORDER — SODIUM CHLORIDE 0.9% IV SOLUTION
250.0000 mL | Freq: Once | INTRAVENOUS | Status: AC
  Administered 2019-11-04: 10:00:00 250 mL via INTRAVENOUS
  Filled 2019-11-04: qty 250

## 2019-11-04 MED ORDER — ACETAMINOPHEN 325 MG PO TABS
ORAL_TABLET | ORAL | Status: AC
  Filled 2019-11-04: qty 2

## 2019-11-04 MED ORDER — ACETAMINOPHEN 325 MG PO TABS
650.0000 mg | ORAL_TABLET | Freq: Once | ORAL | Status: AC
  Administered 2019-11-04: 650 mg via ORAL

## 2019-11-04 MED ORDER — DIPHENHYDRAMINE HCL 25 MG PO CAPS
25.0000 mg | ORAL_CAPSULE | Freq: Once | ORAL | Status: AC
  Administered 2019-11-04: 09:00:00 25 mg via ORAL

## 2019-11-04 NOTE — Patient Instructions (Signed)

## 2019-11-05 LAB — BPAM RBC
Blood Product Expiration Date: 202101212359
Blood Product Expiration Date: 202101212359
ISSUE DATE / TIME: 202101010937
ISSUE DATE / TIME: 202101010937
Unit Type and Rh: 6200
Unit Type and Rh: 6200

## 2019-11-05 LAB — TYPE AND SCREEN
ABO/RH(D): A POS
Antibody Screen: NEGATIVE
Unit division: 0
Unit division: 0

## 2019-11-17 ENCOUNTER — Other Ambulatory Visit: Payer: Self-pay | Admitting: *Deleted

## 2019-11-17 ENCOUNTER — Telehealth: Payer: Self-pay | Admitting: Physician Assistant

## 2019-11-17 ENCOUNTER — Inpatient Hospital Stay: Payer: Medicare Other

## 2019-11-17 ENCOUNTER — Inpatient Hospital Stay (HOSPITAL_BASED_OUTPATIENT_CLINIC_OR_DEPARTMENT_OTHER): Payer: Medicare Other | Admitting: Internal Medicine

## 2019-11-17 ENCOUNTER — Encounter: Payer: Self-pay | Admitting: Internal Medicine

## 2019-11-17 ENCOUNTER — Other Ambulatory Visit: Payer: Self-pay

## 2019-11-17 VITALS — BP 129/47 | HR 63 | Temp 97.3°F | Resp 18 | Ht 62.0 in | Wt 128.5 lb

## 2019-11-17 DIAGNOSIS — D649 Anemia, unspecified: Secondary | ICD-10-CM | POA: Diagnosis not present

## 2019-11-17 DIAGNOSIS — Z5111 Encounter for antineoplastic chemotherapy: Secondary | ICD-10-CM | POA: Diagnosis not present

## 2019-11-17 DIAGNOSIS — I2510 Atherosclerotic heart disease of native coronary artery without angina pectoris: Secondary | ICD-10-CM | POA: Diagnosis not present

## 2019-11-17 DIAGNOSIS — R5383 Other fatigue: Secondary | ICD-10-CM | POA: Diagnosis not present

## 2019-11-17 DIAGNOSIS — D473 Essential (hemorrhagic) thrombocythemia: Secondary | ICD-10-CM

## 2019-11-17 DIAGNOSIS — D72829 Elevated white blood cell count, unspecified: Secondary | ICD-10-CM | POA: Diagnosis not present

## 2019-11-17 DIAGNOSIS — E119 Type 2 diabetes mellitus without complications: Secondary | ICD-10-CM | POA: Diagnosis not present

## 2019-11-17 LAB — CBC WITH DIFFERENTIAL (CANCER CENTER ONLY)
Abs Immature Granulocytes: 0.06 10*3/uL (ref 0.00–0.07)
Basophils Absolute: 0 10*3/uL (ref 0.0–0.1)
Basophils Relative: 0 %
Eosinophils Absolute: 0 10*3/uL (ref 0.0–0.5)
Eosinophils Relative: 1 %
HCT: 25.9 % — ABNORMAL LOW (ref 36.0–46.0)
Hemoglobin: 8.2 g/dL — ABNORMAL LOW (ref 12.0–15.0)
Immature Granulocytes: 1 %
Lymphocytes Relative: 13 %
Lymphs Abs: 0.8 10*3/uL (ref 0.7–4.0)
MCH: 30 pg (ref 26.0–34.0)
MCHC: 31.7 g/dL (ref 30.0–36.0)
MCV: 94.9 fL (ref 80.0–100.0)
Monocytes Absolute: 0.4 10*3/uL (ref 0.1–1.0)
Monocytes Relative: 6 %
Neutro Abs: 4.8 10*3/uL (ref 1.7–7.7)
Neutrophils Relative %: 79 %
Platelet Count: 668 10*3/uL — ABNORMAL HIGH (ref 150–400)
RBC: 2.73 MIL/uL — ABNORMAL LOW (ref 3.87–5.11)
RDW: 25.2 % — ABNORMAL HIGH (ref 11.5–15.5)
WBC Count: 6.1 10*3/uL (ref 4.0–10.5)
nRBC: 0.5 % — ABNORMAL HIGH (ref 0.0–0.2)

## 2019-11-17 LAB — CMP (CANCER CENTER ONLY)
ALT: 9 U/L (ref 0–44)
AST: 16 U/L (ref 15–41)
Albumin: 4.1 g/dL (ref 3.5–5.0)
Alkaline Phosphatase: 40 U/L (ref 38–126)
Anion gap: 11 (ref 5–15)
BUN: 18 mg/dL (ref 8–23)
CO2: 26 mmol/L (ref 22–32)
Calcium: 8.9 mg/dL (ref 8.9–10.3)
Chloride: 107 mmol/L (ref 98–111)
Creatinine: 0.84 mg/dL (ref 0.44–1.00)
GFR, Est AFR Am: >60 mL/min (ref >60)
GFR, Estimated: >60 mL/min (ref >60)
Glucose, Bld: 86 mg/dL (ref 70–99)
Potassium: 4.3 mmol/L (ref 3.5–5.1)
Sodium: 144 mmol/L (ref 135–145)
Total Bilirubin: 0.7 mg/dL (ref 0.3–1.2)
Total Protein: 6.3 g/dL — ABNORMAL LOW (ref 6.5–8.1)

## 2019-11-17 LAB — OCCULT BLOOD X 1 CARD TO LAB, STOOL
Fecal Occult Bld: NEGATIVE
Fecal Occult Bld: NEGATIVE

## 2019-11-17 LAB — LACTATE DEHYDROGENASE: LDH: 327 U/L — ABNORMAL HIGH (ref 98–192)

## 2019-11-17 NOTE — Telephone Encounter (Signed)
Spoke to the patient that her stool cards were negative for any evidence of blood. She expressed understanding. We will see her back in 2 weeks as scheduled

## 2019-11-17 NOTE — Progress Notes (Signed)
[facility] [phone]   [phone]  OFFICE PROGRESS NOTE  [name], [place] [address]  DIAGNOSIS: Essential thrombocythemia with positive JAK2 mutation V617F diagnosed in October 2020.  PRIOR THERAPY: None  CURRENT THERAPY: Hydroxyurea 500 mg p.o. daily.   INTERVAL HISTORY: [name] [name] 74 y.o. female returns to the clinic today for follow-up visit accompanied by her daughter.  The patient is feeling fine today with no concerning complaints except for fatigue.  The patient denied having any chest pain, shortness of breath, cough or hemoptysis.  She denied having any nausea, vomiting, diarrhea or constipation.  She continues to tolerate her treatment with hydroxyurea fairly well.  She is here today for evaluation and repeat blood work.  MEDICAL HISTORY: Past Medical History:  Diagnosis Date  . Anemia   . Coronary artery disease    s/p stent to LAD and LCx  . DM II (diabetes mellitus, type II), controlled ([facility])   . DVT (deep venous thrombosis) ([facility])   . Elevated LFTs   . Hyperlipidemia   . Hypertension   . Myocardial infarction ([facility])    7 yrs ago    ALLERGIES:  is allergic to ampicillin; avelox [moxifloxacin hcl in nacl]; bactrim [sulfamethoxazole-trimethoprim]; ibuprofen; penicillins; codeine; and latex.  MEDICATIONS:  Current Outpatient Medications  Medication Sig Dispense Refill  . fenofibrate micronized (LOFIBRA) 200 MG capsule TAKE ONE CAPSULE BY MOUTH ONCE DAILY BEFORE BREAKFAST 90 capsule 2  . ferrous sulfate 325 (65 FE) MG EC tablet Take 325 mg by mouth daily.    . Fish Oil-Cholecalciferol (FISH OIL + D3) 1000-1000 MG-UNIT CAPS Take 1 tablet by mouth 3 (three) times daily.    . hydroxyurea (HYDREA) 500 MG capsule Take 1 capsule (500 mg total) by mouth daily. May take with food to minimize GI side effects. 30 capsule 3  . metFORMIN (GLUCOPHAGE) 500 MG tablet Take 500 mg by mouth 2 (two) times daily  with a meal.      . nitroGLYCERIN (NITROSTAT) 0.4 MG SL tablet Place 1 tablet (0.4 mg total) under the tongue every 5 (five) minutes as needed. For chest pain. Please make yearly appt with Dr. Acie Fredrickson. 25 tablet 0  . prochlorperazine (COMPAZINE) 10 MG tablet Take 1 tablet (10 mg total) by mouth every 6 (six) hours as needed for nausea or vomiting. 30 tablet 2  . rivaroxaban (XARELTO) 20 MG TABS tablet Take 20 mg by mouth daily with supper.    . rosuvastatin (CRESTOR) 20 MG tablet Take 10 mg by mouth daily.    . metoprolol tartrate (LOPRESSOR) 25 MG tablet Take 1 tablet (25 mg total) by mouth 2 (two) times daily. 180 tablet 3   No current facility-administered medications for this visit.    SURGICAL HISTORY:  Past Surgical History:  Procedure Laterality Date  . CATARACT EXTRACTION    . CESAREAN SECTION  1983  . CORONARY ANGIOPLASTY WITH STENT PLACEMENT  04/26/2003   NORMAL. EF 65%  . I & D EXTREMITY  06/22/2012   Procedure: IRRIGATION AND DEBRIDEMENT EXTREMITY;  Surgeon: Tennis Must, MD;  Location: [facility];  Service: Orthopedics;  Laterality: Left;  . I & D EXTREMITY  06/29/2012   Procedure: IRRIGATION AND DEBRIDEMENT EXTREMITY;  Surgeon: Tennis Must, MD;  Location: [facility];  Service: Orthopedics;  Laterality: Left;  . OPEN REDUCTION INTERNAL FIXATION (ORIF) DISTAL RADIAL FRACTURE Right 02/04/2018   Procedure: OPEN REDUCTION INTERNAL FIXATION (ORIF) RIGHT DISTAL  RADIAL FRACTURE;  Surgeon: Leanora Cover, MD;  Location: [facility];  Service: Orthopedics;  Laterality: Right;    REVIEW OF SYSTEMS:  A comprehensive review of systems was negative except for: Constitutional: positive for fatigue   PHYSICAL EXAMINATION: General appearance: alert, cooperative, fatigued and no distress Head: Normocephalic, without obvious abnormality, atraumatic Neck: no adenopathy, no JVD, supple, symmetrical, trachea midline and thyroid not enlarged, symmetric, no tenderness/mass/nodules Lymph nodes:  Cervical, supraclavicular, and axillary nodes normal. Resp: clear to auscultation bilaterally Back: symmetric, no curvature. ROM normal. No CVA tenderness. Cardio: regular rate and rhythm, S1, S2 normal, no murmur, click, rub or gallop GI: soft, non-tender; bowel sounds normal; no masses,  no organomegaly Extremities: extremities normal, atraumatic, no cyanosis or edema  ECOG PERFORMANCE STATUS: 1 - Symptomatic but completely ambulatory  Blood pressure (!) 129/47, pulse 63, temperature (!) 97.3 F (36.3 C), temperature source Temporal, resp. rate 18, height 5\' 2"  (1.575 m), weight 128 lb 8 oz (58.3 kg), SpO2 99 %.  LABORATORY DATA: Lab Results  Component Value Date   WBC 6.1 11/17/2019   HGB 8.2 (L) 11/17/2019   HCT 25.9 (L) 11/17/2019   MCV 94.9 11/17/2019   PLT 668 (H) 11/17/2019      Chemistry      Component Value Date/Time   NA 144 11/17/2019 1126   K 4.3 11/17/2019 1126   CL 107 11/17/2019 1126   CO2 26 11/17/2019 1126   BUN 18 11/17/2019 1126   CREATININE 0.84 11/17/2019 1126      Component Value Date/Time   CALCIUM 8.9 11/17/2019 1126   ALKPHOS 40 11/17/2019 1126   AST 16 11/17/2019 1126   ALT 9 11/17/2019 1126   BILITOT 0.7 11/17/2019 1126       RADIOGRAPHIC STUDIES: No results found.  ASSESSMENT AND PLAN: This is a very pleasant 74 years old white female recently diagnosed with essential thrombocythemia with positive JAK2 mutation.  The patient also has anemia and leukocytosis secondary to the myeloproliferative disorder. The patient is currently on treatment with hydroxyurea initially at 500 mg increased to 1000 mg but the patient could not tolerate the higher dose well and we switched her back to 500 mg p.o. daily 2 weeks ago.  She also received 2 units of PRBCs transfusion for the anemia. CBC today showed normalization of her total white blood count in addition to improved hemoglobin and hematocrit but the patient has higher platelets count. I recommended  for the patient to continue her current treatment with hydroxyurea at 500 mg p.o. daily. I will see her back for follow-up visit in 2 weeks for evaluation with repeat blood work. For the anemia she will start taking oral iron tablets at regular basis. She was advised to call immediately if she has any concerning symptoms in the interval. The patient voices understanding of current disease status and treatment options and is in agreement with the current care plan.  All questions were answered. The patient knows to call the clinic with any problems, questions or concerns. We can certainly see the patient much sooner if necessary.  Disclaimer: This note was dictated with voice recognition software. Similar sounding words can inadvertently be transcribed and may not be corrected upon review.

## 2019-11-18 ENCOUNTER — Other Ambulatory Visit: Payer: Self-pay | Admitting: Internal Medicine

## 2019-11-18 DIAGNOSIS — Z1231 Encounter for screening mammogram for malignant neoplasm of breast: Secondary | ICD-10-CM

## 2019-11-21 ENCOUNTER — Telehealth: Payer: Self-pay | Admitting: Internal Medicine

## 2019-11-21 NOTE — Telephone Encounter (Signed)
Scheduled per 01/14 los, patient has been called and notified.

## 2019-12-05 ENCOUNTER — Inpatient Hospital Stay: Payer: Medicare Other | Attending: Physician Assistant

## 2019-12-05 ENCOUNTER — Inpatient Hospital Stay (HOSPITAL_BASED_OUTPATIENT_CLINIC_OR_DEPARTMENT_OTHER): Payer: Medicare Other | Admitting: Internal Medicine

## 2019-12-05 ENCOUNTER — Other Ambulatory Visit: Payer: Self-pay

## 2019-12-05 ENCOUNTER — Encounter: Payer: Self-pay | Admitting: Internal Medicine

## 2019-12-05 VITALS — BP 129/54 | HR 72 | Temp 97.7°F | Resp 18 | Wt 129.2 lb

## 2019-12-05 DIAGNOSIS — Z5111 Encounter for antineoplastic chemotherapy: Secondary | ICD-10-CM | POA: Diagnosis not present

## 2019-12-05 DIAGNOSIS — I2510 Atherosclerotic heart disease of native coronary artery without angina pectoris: Secondary | ICD-10-CM | POA: Diagnosis not present

## 2019-12-05 DIAGNOSIS — Z7901 Long term (current) use of anticoagulants: Secondary | ICD-10-CM | POA: Insufficient documentation

## 2019-12-05 DIAGNOSIS — Z86718 Personal history of other venous thrombosis and embolism: Secondary | ICD-10-CM | POA: Diagnosis not present

## 2019-12-05 DIAGNOSIS — I10 Essential (primary) hypertension: Secondary | ICD-10-CM

## 2019-12-05 DIAGNOSIS — D473 Essential (hemorrhagic) thrombocythemia: Secondary | ICD-10-CM | POA: Insufficient documentation

## 2019-12-05 DIAGNOSIS — E785 Hyperlipidemia, unspecified: Secondary | ICD-10-CM | POA: Diagnosis not present

## 2019-12-05 DIAGNOSIS — Z79899 Other long term (current) drug therapy: Secondary | ICD-10-CM | POA: Diagnosis not present

## 2019-12-05 DIAGNOSIS — Z7984 Long term (current) use of oral hypoglycemic drugs: Secondary | ICD-10-CM | POA: Insufficient documentation

## 2019-12-05 DIAGNOSIS — I252 Old myocardial infarction: Secondary | ICD-10-CM | POA: Insufficient documentation

## 2019-12-05 DIAGNOSIS — D649 Anemia, unspecified: Secondary | ICD-10-CM | POA: Insufficient documentation

## 2019-12-05 DIAGNOSIS — E119 Type 2 diabetes mellitus without complications: Secondary | ICD-10-CM | POA: Insufficient documentation

## 2019-12-05 LAB — CBC WITH DIFFERENTIAL (CANCER CENTER ONLY)
Abs Immature Granulocytes: 0.04 10*3/uL (ref 0.00–0.07)
Basophils Absolute: 0 10*3/uL (ref 0.0–0.1)
Basophils Relative: 0 %
Eosinophils Absolute: 0.1 10*3/uL (ref 0.0–0.5)
Eosinophils Relative: 1 %
HCT: 24.8 % — ABNORMAL LOW (ref 36.0–46.0)
Hemoglobin: 7.9 g/dL — ABNORMAL LOW (ref 12.0–15.0)
Immature Granulocytes: 1 %
Lymphocytes Relative: 9 %
Lymphs Abs: 0.6 10*3/uL — ABNORMAL LOW (ref 0.7–4.0)
MCH: 31.2 pg (ref 26.0–34.0)
MCHC: 31.9 g/dL (ref 30.0–36.0)
MCV: 98 fL (ref 80.0–100.0)
Monocytes Absolute: 0.3 10*3/uL (ref 0.1–1.0)
Monocytes Relative: 4 %
Neutro Abs: 5.6 10*3/uL (ref 1.7–7.7)
Neutrophils Relative %: 85 %
Platelet Count: 554 10*3/uL — ABNORMAL HIGH (ref 150–400)
RBC: 2.53 MIL/uL — ABNORMAL LOW (ref 3.87–5.11)
RDW: 28.3 % — ABNORMAL HIGH (ref 11.5–15.5)
WBC Count: 6.7 10*3/uL (ref 4.0–10.5)
nRBC: 0.3 % — ABNORMAL HIGH (ref 0.0–0.2)

## 2019-12-05 LAB — CMP (CANCER CENTER ONLY)
ALT: 10 U/L (ref 0–44)
AST: 17 U/L (ref 15–41)
Albumin: 4.3 g/dL (ref 3.5–5.0)
Alkaline Phosphatase: 36 U/L — ABNORMAL LOW (ref 38–126)
Anion gap: 8 (ref 5–15)
BUN: 18 mg/dL (ref 8–23)
CO2: 27 mmol/L (ref 22–32)
Calcium: 9.1 mg/dL (ref 8.9–10.3)
Chloride: 107 mmol/L (ref 98–111)
Creatinine: 1.05 mg/dL — ABNORMAL HIGH (ref 0.44–1.00)
GFR, Est AFR Am: >60 mL/min (ref >60)
GFR, Estimated: 53 mL/min — ABNORMAL LOW (ref >60)
Glucose, Bld: 161 mg/dL — ABNORMAL HIGH (ref 70–99)
Potassium: 4.2 mmol/L (ref 3.5–5.1)
Sodium: 142 mmol/L (ref 135–145)
Total Bilirubin: 0.7 mg/dL (ref 0.3–1.2)
Total Protein: 6.2 g/dL — ABNORMAL LOW (ref 6.5–8.1)

## 2019-12-05 LAB — LACTATE DEHYDROGENASE: LDH: 264 U/L — ABNORMAL HIGH (ref 98–192)

## 2019-12-05 NOTE — Progress Notes (Signed)
[facility] Telephone:(336) [phone]   Fax:(336) [phone]  OFFICE PROGRESS NOTE  [name], MD [place] [address]  DIAGNOSIS: Essential thrombocythemia with positive JAK2 mutation V617F diagnosed in October 2020.  PRIOR THERAPY: None  CURRENT THERAPY: Hydroxyurea 500 mg p.o. daily.   INTERVAL HISTORY: [name] [name] 74 y.o. female returns to the clinic today for follow-up visit accompanied by her daughter [name] [name].  The patient is feeling fine today with no concerning complaints except for change in her taste.  She has this problem several months ago before starting the hydroxyurea.  She denied having any current chest pain, shortness of breath, cough or hemoptysis.  She denied having any fever or chills.  She has no nausea, vomiting, diarrhea or constipation.  She has no bleeding issues.  She continues to tolerate her treatment with hydroxyurea fairly well.   MEDICAL HISTORY: Past Medical History:  Diagnosis Date  . Anemia   . Coronary artery disease    s/p stent to LAD and LCx  . DM II (diabetes mellitus, type II), controlled ([facility])   . DVT (deep venous thrombosis) ([facility])   . Elevated LFTs   . Hyperlipidemia   . Hypertension   . Myocardial infarction ([facility])    7 yrs ago    ALLERGIES:  is allergic to ampicillin; avelox [moxifloxacin hcl in nacl]; bactrim [sulfamethoxazole-trimethoprim]; ibuprofen; penicillins; codeine; and latex.  MEDICATIONS:  Current Outpatient Medications  Medication Sig Dispense Refill  . fenofibrate micronized (LOFIBRA) 200 MG capsule TAKE ONE CAPSULE BY MOUTH ONCE DAILY BEFORE BREAKFAST 90 capsule 2  . ferrous sulfate 325 (65 FE) MG EC tablet Take 325 mg by mouth daily.    . Fish Oil-Cholecalciferol (FISH OIL + D3) 1000-1000 MG-UNIT CAPS Take 1 tablet by mouth 3 (three) times daily.    . hydroxyurea (HYDREA) 500 MG capsule Take 1 capsule (500 mg total) by mouth daily. May take with food to minimize GI side effects. 30  capsule 3  . metFORMIN (GLUCOPHAGE) 500 MG tablet Take 500 mg by mouth 2 (two) times daily with a meal.      . metoprolol tartrate (LOPRESSOR) 25 MG tablet Take 1 tablet (25 mg total) by mouth 2 (two) times daily. 180 tablet 3  . nitroGLYCERIN (NITROSTAT) 0.4 MG SL tablet Place 1 tablet (0.4 mg total) under the tongue every 5 (five) minutes as needed. For chest pain. Please make yearly appt with Dr. Acie Fredrickson. 25 tablet 0  . prochlorperazine (COMPAZINE) 10 MG tablet Take 1 tablet (10 mg total) by mouth every 6 (six) hours as needed for nausea or vomiting. 30 tablet 2  . rivaroxaban (XARELTO) 20 MG TABS tablet Take 20 mg by mouth daily with supper.    . rosuvastatin (CRESTOR) 20 MG tablet Take 10 mg by mouth daily.     No current facility-administered medications for this visit.    SURGICAL HISTORY:  Past Surgical History:  Procedure Laterality Date  . CATARACT EXTRACTION    . CESAREAN SECTION  1983  . CORONARY ANGIOPLASTY WITH STENT PLACEMENT  04/26/2003   NORMAL. EF 65%  . I & D EXTREMITY  06/22/2012   Procedure: IRRIGATION AND DEBRIDEMENT EXTREMITY;  Surgeon: Tennis Must, MD;  Location: [facility];  Service: Orthopedics;  Laterality: Left;  . I & D EXTREMITY  06/29/2012   Procedure: IRRIGATION AND DEBRIDEMENT EXTREMITY;  Surgeon: Tennis Must, MD;  Location: [facility];  Service: Orthopedics;  Laterality: Left;  . OPEN  REDUCTION INTERNAL FIXATION (ORIF) DISTAL RADIAL FRACTURE Right 02/04/2018   Procedure: OPEN REDUCTION INTERNAL FIXATION (ORIF) RIGHT DISTAL RADIAL FRACTURE;  Surgeon: Leanora Cover, MD;  Location: [facility];  Service: Orthopedics;  Laterality: Right;    REVIEW OF SYSTEMS:  A comprehensive review of systems was negative except for: Constitutional: positive for fatigue   PHYSICAL EXAMINATION: General appearance: alert, cooperative, fatigued and no distress Head: Normocephalic, without obvious abnormality, atraumatic Neck: no adenopathy, no JVD, supple, symmetrical,  trachea midline and thyroid not enlarged, symmetric, no tenderness/mass/nodules Lymph nodes: Cervical, supraclavicular, and axillary nodes normal. Resp: clear to auscultation bilaterally Back: symmetric, no curvature. ROM normal. No CVA tenderness. Cardio: regular rate and rhythm, S1, S2 normal, no murmur, click, rub or gallop GI: soft, non-tender; bowel sounds normal; no masses,  no organomegaly Extremities: extremities normal, atraumatic, no cyanosis or edema  ECOG PERFORMANCE STATUS: 1 - Symptomatic but completely ambulatory  Blood pressure (!) 129/54, pulse 72, temperature 97.7 F (36.5 C), temperature source Temporal, resp. rate 18, weight 129 lb 3.2 oz (58.6 kg), SpO2 100 %.  LABORATORY DATA: Lab Results  Component Value Date   WBC 6.7 12/05/2019   HGB 7.9 (L) 12/05/2019   HCT 24.8 (L) 12/05/2019   MCV 98.0 12/05/2019   PLT 554 (H) 12/05/2019      Chemistry      Component Value Date/Time   NA 144 11/17/2019 1126   K 4.3 11/17/2019 1126   CL 107 11/17/2019 1126   CO2 26 11/17/2019 1126   BUN 18 11/17/2019 1126   CREATININE 0.84 11/17/2019 1126      Component Value Date/Time   CALCIUM 8.9 11/17/2019 1126   ALKPHOS 40 11/17/2019 1126   AST 16 11/17/2019 1126   ALT 9 11/17/2019 1126   BILITOT 0.7 11/17/2019 1126       RADIOGRAPHIC STUDIES: No results found.  ASSESSMENT AND PLAN: This is a very pleasant 74 years old white female recently diagnosed with essential thrombocythemia with positive JAK2 mutation.  The patient also has anemia and leukocytosis secondary to the myeloproliferative disorder. The patient is currently on treatment with hydroxyurea initially at 500 mg increased to 1000 mg but the patient could not tolerate the higher dose well and we switched her back to 500 mg p.o. daily 2 weeks ago.   She is now tolerating her treatment with hydroxyurea 1 tablet daily much better. Repeat CBC today showed further decrease in the platelets count down to 554,000.  Her hemoglobin is 7.9 today but we will continue to monitor for now and consider for transfusion if the patient becomes more symptomatic. I will arrange for the patient to have repeat CBC in 2 weeks. I will see her back for follow-up visit in 1 month with repeat blood work. She was advised to call immediately if she has any concerning symptoms in the interval. The patient voices understanding of current disease status and treatment options and is in agreement with the current care plan.  All questions were answered. The patient knows to call the clinic with any problems, questions or concerns. We can certainly see the patient much sooner if necessary.  Disclaimer: This note was dictated with voice recognition software. Similar sounding words can inadvertently be transcribed and may not be corrected upon review.

## 2019-12-07 ENCOUNTER — Telehealth: Payer: Self-pay | Admitting: Internal Medicine

## 2019-12-07 NOTE — Telephone Encounter (Signed)
Scheduled per los. Called and spoke with patient. Confirmed appts  

## 2019-12-19 ENCOUNTER — Telehealth: Payer: Self-pay | Admitting: Medical Oncology

## 2019-12-19 ENCOUNTER — Other Ambulatory Visit: Payer: Self-pay

## 2019-12-19 ENCOUNTER — Inpatient Hospital Stay: Payer: Medicare Other

## 2019-12-19 ENCOUNTER — Other Ambulatory Visit: Payer: Self-pay | Admitting: Medical Oncology

## 2019-12-19 DIAGNOSIS — I10 Essential (primary) hypertension: Secondary | ICD-10-CM | POA: Diagnosis not present

## 2019-12-19 DIAGNOSIS — D473 Essential (hemorrhagic) thrombocythemia: Secondary | ICD-10-CM | POA: Diagnosis not present

## 2019-12-19 DIAGNOSIS — E119 Type 2 diabetes mellitus without complications: Secondary | ICD-10-CM | POA: Diagnosis not present

## 2019-12-19 DIAGNOSIS — D649 Anemia, unspecified: Secondary | ICD-10-CM

## 2019-12-19 DIAGNOSIS — E785 Hyperlipidemia, unspecified: Secondary | ICD-10-CM | POA: Diagnosis not present

## 2019-12-19 DIAGNOSIS — I2510 Atherosclerotic heart disease of native coronary artery without angina pectoris: Secondary | ICD-10-CM | POA: Diagnosis not present

## 2019-12-19 LAB — CBC WITH DIFFERENTIAL (CANCER CENTER ONLY)
Abs Immature Granulocytes: 0.05 10*3/uL (ref 0.00–0.07)
Basophils Absolute: 0 10*3/uL (ref 0.0–0.1)
Basophils Relative: 1 %
Eosinophils Absolute: 0.1 10*3/uL (ref 0.0–0.5)
Eosinophils Relative: 2 %
HCT: 23.1 % — ABNORMAL LOW (ref 36.0–46.0)
Hemoglobin: 7.3 g/dL — ABNORMAL LOW (ref 12.0–15.0)
Immature Granulocytes: 1 %
Lymphocytes Relative: 14 %
Lymphs Abs: 0.8 10*3/uL (ref 0.7–4.0)
MCH: 33.6 pg (ref 26.0–34.0)
MCHC: 31.6 g/dL (ref 30.0–36.0)
MCV: 106.5 fL — ABNORMAL HIGH (ref 80.0–100.0)
Monocytes Absolute: 0.3 10*3/uL (ref 0.1–1.0)
Monocytes Relative: 6 %
Neutro Abs: 4.2 10*3/uL (ref 1.7–7.7)
Neutrophils Relative %: 76 %
Platelet Count: 611 10*3/uL — ABNORMAL HIGH (ref 150–400)
RBC: 2.17 MIL/uL — ABNORMAL LOW (ref 3.87–5.11)
RDW: 32.1 % — ABNORMAL HIGH (ref 11.5–15.5)
WBC Count: 5.4 10*3/uL (ref 4.0–10.5)
nRBC: 1.1 % — ABNORMAL HIGH (ref 0.0–0.2)

## 2019-12-19 NOTE — Telephone Encounter (Signed)
Dtr notified need for transfusion.

## 2019-12-19 NOTE — Telephone Encounter (Signed)
Called pt about blood transfusion and to expect a call from our schedulers.

## 2019-12-20 ENCOUNTER — Other Ambulatory Visit: Payer: Self-pay | Admitting: Medical Oncology

## 2019-12-20 ENCOUNTER — Telehealth: Payer: Self-pay | Admitting: Internal Medicine

## 2019-12-20 DIAGNOSIS — D473 Essential (hemorrhagic) thrombocythemia: Secondary | ICD-10-CM | POA: Diagnosis not present

## 2019-12-20 DIAGNOSIS — E785 Hyperlipidemia, unspecified: Secondary | ICD-10-CM | POA: Diagnosis not present

## 2019-12-20 DIAGNOSIS — D649 Anemia, unspecified: Secondary | ICD-10-CM

## 2019-12-20 DIAGNOSIS — I10 Essential (primary) hypertension: Secondary | ICD-10-CM | POA: Diagnosis not present

## 2019-12-20 DIAGNOSIS — I2510 Atherosclerotic heart disease of native coronary artery without angina pectoris: Secondary | ICD-10-CM | POA: Diagnosis not present

## 2019-12-20 DIAGNOSIS — E119 Type 2 diabetes mellitus without complications: Secondary | ICD-10-CM | POA: Diagnosis not present

## 2019-12-20 NOTE — Telephone Encounter (Signed)
Scheduled appt per 2/15 sch message - pt daughter aware of appt

## 2019-12-21 ENCOUNTER — Other Ambulatory Visit: Payer: Self-pay

## 2019-12-21 ENCOUNTER — Inpatient Hospital Stay: Payer: Medicare Other

## 2019-12-21 DIAGNOSIS — E119 Type 2 diabetes mellitus without complications: Secondary | ICD-10-CM | POA: Diagnosis not present

## 2019-12-21 DIAGNOSIS — E785 Hyperlipidemia, unspecified: Secondary | ICD-10-CM | POA: Diagnosis not present

## 2019-12-21 DIAGNOSIS — D649 Anemia, unspecified: Secondary | ICD-10-CM

## 2019-12-21 DIAGNOSIS — I10 Essential (primary) hypertension: Secondary | ICD-10-CM | POA: Diagnosis not present

## 2019-12-21 DIAGNOSIS — D473 Essential (hemorrhagic) thrombocythemia: Secondary | ICD-10-CM | POA: Diagnosis not present

## 2019-12-21 DIAGNOSIS — I2510 Atherosclerotic heart disease of native coronary artery without angina pectoris: Secondary | ICD-10-CM | POA: Diagnosis not present

## 2019-12-21 LAB — PREPARE RBC (CROSSMATCH)

## 2019-12-21 MED ORDER — DIPHENHYDRAMINE HCL 25 MG PO CAPS
25.0000 mg | ORAL_CAPSULE | Freq: Once | ORAL | Status: AC
  Administered 2019-12-21: 25 mg via ORAL

## 2019-12-21 MED ORDER — SODIUM CHLORIDE 0.9% IV SOLUTION
250.0000 mL | Freq: Once | INTRAVENOUS | Status: AC
  Administered 2019-12-21: 250 mL via INTRAVENOUS
  Filled 2019-12-21: qty 250

## 2019-12-21 MED ORDER — ACETAMINOPHEN 325 MG PO TABS
ORAL_TABLET | ORAL | Status: AC
  Filled 2019-12-21: qty 2

## 2019-12-21 MED ORDER — DIPHENHYDRAMINE HCL 25 MG PO CAPS
ORAL_CAPSULE | ORAL | Status: AC
  Filled 2019-12-21: qty 1

## 2019-12-21 MED ORDER — ACETAMINOPHEN 325 MG PO TABS
650.0000 mg | ORAL_TABLET | Freq: Once | ORAL | Status: AC
  Administered 2019-12-21: 650 mg via ORAL

## 2019-12-21 NOTE — Patient Instructions (Signed)

## 2019-12-22 LAB — BPAM RBC
Blood Product Expiration Date: 202103222359
Blood Product Expiration Date: 202103222359
ISSUE DATE / TIME: 202102171232
ISSUE DATE / TIME: 202102171232
Unit Type and Rh: 6200
Unit Type and Rh: 6200

## 2019-12-22 LAB — TYPE AND SCREEN
ABO/RH(D): A POS
Antibody Screen: NEGATIVE
Unit division: 0
Unit division: 0

## 2019-12-27 ENCOUNTER — Ambulatory Visit
Admission: RE | Admit: 2019-12-27 | Discharge: 2019-12-27 | Disposition: A | Discharge status: Home / Self Care | Payer: Medicare Other | Source: Ambulatory Visit | Attending: Internal Medicine | Admitting: Internal Medicine

## 2019-12-27 ENCOUNTER — Other Ambulatory Visit: Payer: Self-pay

## 2019-12-27 DIAGNOSIS — Z1231 Encounter for screening mammogram for malignant neoplasm of breast: Secondary | ICD-10-CM | POA: Diagnosis not present

## 2019-12-29 DIAGNOSIS — Z86718 Personal history of other venous thrombosis and embolism: Secondary | ICD-10-CM | POA: Diagnosis not present

## 2019-12-29 DIAGNOSIS — I119 Hypertensive heart disease without heart failure: Secondary | ICD-10-CM | POA: Diagnosis not present

## 2019-12-29 DIAGNOSIS — R42 Dizziness and giddiness: Secondary | ICD-10-CM | POA: Diagnosis not present

## 2019-12-29 DIAGNOSIS — D649 Anemia, unspecified: Secondary | ICD-10-CM | POA: Diagnosis not present

## 2019-12-29 DIAGNOSIS — F3341 Major depressive disorder, recurrent, in partial remission: Secondary | ICD-10-CM | POA: Diagnosis not present

## 2019-12-29 DIAGNOSIS — D473 Essential (hemorrhagic) thrombocythemia: Secondary | ICD-10-CM | POA: Diagnosis not present

## 2019-12-29 DIAGNOSIS — E1165 Type 2 diabetes mellitus with hyperglycemia: Secondary | ICD-10-CM | POA: Diagnosis not present

## 2019-12-29 DIAGNOSIS — D8989 Other specified disorders involving the immune mechanism, not elsewhere classified: Secondary | ICD-10-CM | POA: Diagnosis not present

## 2019-12-29 DIAGNOSIS — Z7901 Long term (current) use of anticoagulants: Secondary | ICD-10-CM | POA: Diagnosis not present

## 2019-12-29 DIAGNOSIS — I2510 Atherosclerotic heart disease of native coronary artery without angina pectoris: Secondary | ICD-10-CM | POA: Diagnosis not present

## 2020-01-02 ENCOUNTER — Inpatient Hospital Stay: Payer: Medicare Other

## 2020-01-02 ENCOUNTER — Inpatient Hospital Stay: Payer: Medicare Other | Attending: Physician Assistant | Admitting: Internal Medicine

## 2020-01-02 ENCOUNTER — Telehealth: Payer: Self-pay | Admitting: Internal Medicine

## 2020-01-02 ENCOUNTER — Encounter: Payer: Self-pay | Admitting: Internal Medicine

## 2020-01-02 ENCOUNTER — Other Ambulatory Visit: Payer: Self-pay

## 2020-01-02 VITALS — BP 134/52 | HR 60 | Temp 98.5°F | Resp 20 | Ht 62.0 in | Wt 128.4 lb

## 2020-01-02 DIAGNOSIS — C946 Myelodysplastic disease, not classified: Secondary | ICD-10-CM | POA: Insufficient documentation

## 2020-01-02 DIAGNOSIS — Z7984 Long term (current) use of oral hypoglycemic drugs: Secondary | ICD-10-CM | POA: Insufficient documentation

## 2020-01-02 DIAGNOSIS — D473 Essential (hemorrhagic) thrombocythemia: Secondary | ICD-10-CM | POA: Insufficient documentation

## 2020-01-02 DIAGNOSIS — Z7901 Long term (current) use of anticoagulants: Secondary | ICD-10-CM | POA: Diagnosis not present

## 2020-01-02 DIAGNOSIS — Z5111 Encounter for antineoplastic chemotherapy: Secondary | ICD-10-CM | POA: Diagnosis not present

## 2020-01-02 DIAGNOSIS — I10 Essential (primary) hypertension: Secondary | ICD-10-CM | POA: Diagnosis not present

## 2020-01-02 DIAGNOSIS — Z86718 Personal history of other venous thrombosis and embolism: Secondary | ICD-10-CM | POA: Insufficient documentation

## 2020-01-02 DIAGNOSIS — I2510 Atherosclerotic heart disease of native coronary artery without angina pectoris: Secondary | ICD-10-CM | POA: Insufficient documentation

## 2020-01-02 DIAGNOSIS — D649 Anemia, unspecified: Secondary | ICD-10-CM | POA: Diagnosis not present

## 2020-01-02 DIAGNOSIS — R948 Abnormal results of function studies of other organs and systems: Secondary | ICD-10-CM | POA: Insufficient documentation

## 2020-01-02 DIAGNOSIS — E119 Type 2 diabetes mellitus without complications: Secondary | ICD-10-CM | POA: Diagnosis not present

## 2020-01-02 DIAGNOSIS — I252 Old myocardial infarction: Secondary | ICD-10-CM | POA: Diagnosis not present

## 2020-01-02 DIAGNOSIS — E785 Hyperlipidemia, unspecified: Secondary | ICD-10-CM | POA: Diagnosis not present

## 2020-01-02 DIAGNOSIS — Z79899 Other long term (current) drug therapy: Secondary | ICD-10-CM | POA: Diagnosis not present

## 2020-01-02 LAB — CMP (CANCER CENTER ONLY)
ALT: 12 U/L (ref 0–44)
AST: 19 U/L (ref 15–41)
Albumin: 4.4 g/dL (ref 3.5–5.0)
Alkaline Phosphatase: 37 U/L — ABNORMAL LOW (ref 38–126)
Anion gap: 8 (ref 5–15)
BUN: 18 mg/dL (ref 8–23)
CO2: 29 mmol/L (ref 22–32)
Calcium: 9.1 mg/dL (ref 8.9–10.3)
Chloride: 107 mmol/L (ref 98–111)
Creatinine: 0.86 mg/dL (ref 0.44–1.00)
GFR, Est AFR Am: >60 mL/min (ref >60)
GFR, Estimated: >60 mL/min (ref >60)
Glucose, Bld: 103 mg/dL — ABNORMAL HIGH (ref 70–99)
Potassium: 4 mmol/L (ref 3.5–5.1)
Sodium: 144 mmol/L (ref 135–145)
Total Bilirubin: 0.6 mg/dL (ref 0.3–1.2)
Total Protein: 6.5 g/dL (ref 6.5–8.1)

## 2020-01-02 LAB — CBC WITH DIFFERENTIAL (CANCER CENTER ONLY)
Abs Immature Granulocytes: 0.03 10*3/uL (ref 0.00–0.07)
Basophils Absolute: 0 10*3/uL (ref 0.0–0.1)
Basophils Relative: 1 %
Eosinophils Absolute: 0.1 10*3/uL (ref 0.0–0.5)
Eosinophils Relative: 1 %
HCT: 31.4 % — ABNORMAL LOW (ref 36.0–46.0)
Hemoglobin: 9.9 g/dL — ABNORMAL LOW (ref 12.0–15.0)
Immature Granulocytes: 1 %
Lymphocytes Relative: 17 %
Lymphs Abs: 0.9 10*3/uL (ref 0.7–4.0)
MCH: 32.2 pg (ref 26.0–34.0)
MCHC: 31.5 g/dL (ref 30.0–36.0)
MCV: 102.3 fL — ABNORMAL HIGH (ref 80.0–100.0)
Monocytes Absolute: 0.2 10*3/uL (ref 0.1–1.0)
Monocytes Relative: 4 %
Neutro Abs: 4.2 10*3/uL (ref 1.7–7.7)
Neutrophils Relative %: 76 %
Platelet Count: 554 10*3/uL — ABNORMAL HIGH (ref 150–400)
RBC: 3.07 MIL/uL — ABNORMAL LOW (ref 3.87–5.11)
RDW: 26.5 % — ABNORMAL HIGH (ref 11.5–15.5)
WBC Count: 5.5 10*3/uL (ref 4.0–10.5)
nRBC: 0 % (ref 0.0–0.2)

## 2020-01-02 LAB — LACTATE DEHYDROGENASE: LDH: 276 U/L — ABNORMAL HIGH (ref 98–192)

## 2020-01-02 LAB — SAMPLE TO BLOOD BANK

## 2020-01-02 NOTE — Telephone Encounter (Signed)
Scheduled per los. Gave avs and calendar  

## 2020-01-02 NOTE — Progress Notes (Signed)
[name] [name]   Fax:([phone]  OFFICE PROGRESS NOTE  [name], MD [place] [address]  DIAGNOSIS: Essential thrombocythemia with positive JAK2 mutation V617F diagnosed in October 2020.  PRIOR THERAPY: None  CURRENT THERAPY: Hydroxyurea 500 mg p.o. daily.   INTERVAL HISTORY: [name] [name] 74 y.o. female returns to the clinic today for follow-up visit.  The patient is feeling fine today with no concerning complaints.  She denied having any fatigue or weakness.  She denied having any bleeding issues.  She has no chest pain, shortness of breath, cough or hemoptysis.  She denied having any recent weight loss or night sweats.  She has no headache or visual changes.  She has been tolerating her treatment with hydroxyurea fairly well.   MEDICAL HISTORY: Past Medical History:  Diagnosis Date  . Anemia   . Coronary artery disease    s/p stent to LAD and LCx  . DM II (diabetes mellitus, type II), controlled ([facility])   . DVT (deep venous thrombosis) ([facility])   . Elevated LFTs   . Hyperlipidemia   . Hypertension   . Myocardial infarction ([facility])    7 yrs ago    ALLERGIES:  is allergic to ampicillin; avelox [moxifloxacin hcl in nacl]; bactrim [sulfamethoxazole-trimethoprim]; ibuprofen; penicillins; codeine; and latex.  MEDICATIONS:  Current Outpatient Medications  Medication Sig Dispense Refill  . fenofibrate micronized (LOFIBRA) 200 MG capsule TAKE ONE CAPSULE BY MOUTH ONCE DAILY BEFORE BREAKFAST 90 capsule 2  . ferrous sulfate 325 (65 FE) MG EC tablet Take 325 mg by mouth daily.    . Fish Oil-Cholecalciferol (FISH OIL + D3) 1000-1000 MG-UNIT CAPS Take 1 tablet by mouth 3 (three) times daily.    . hydroxyurea (HYDREA) 500 MG capsule Take 1 capsule (500 mg total) by mouth daily. May take with food to minimize GI side effects. 30 capsule 3  . metFORMIN (GLUCOPHAGE) 500 MG tablet Take 500 mg by mouth 2 (two) times daily with  a meal.      . metoprolol tartrate (LOPRESSOR) 25 MG tablet Take 1 tablet (25 mg total) by mouth 2 (two) times daily. 180 tablet 3  . nitroGLYCERIN (NITROSTAT) 0.4 MG SL tablet Place 1 tablet (0.4 mg total) under the tongue every 5 (five) minutes as needed. For chest pain. Please make yearly appt with Dr. Acie Fredrickson. 25 tablet 0  . prochlorperazine (COMPAZINE) 10 MG tablet Take 1 tablet (10 mg total) by mouth every 6 (six) hours as needed for nausea or vomiting. 30 tablet 2  . rivaroxaban (XARELTO) 20 MG TABS tablet Take 20 mg by mouth daily with supper.    . rosuvastatin (CRESTOR) 20 MG tablet Take 10 mg by mouth daily.     No current facility-administered medications for this visit.    SURGICAL HISTORY:  Past Surgical History:  Procedure Laterality Date  . CATARACT EXTRACTION    . CESAREAN SECTION  1983  . CORONARY ANGIOPLASTY WITH STENT PLACEMENT  04/26/2003   NORMAL. EF 65%  . I & D EXTREMITY  06/22/2012   Procedure: IRRIGATION AND DEBRIDEMENT EXTREMITY;  Surgeon: Tennis Must, MD;  Location: [facility];  Service: Orthopedics;  Laterality: Left;  . I & D EXTREMITY  06/29/2012   Procedure: IRRIGATION AND DEBRIDEMENT EXTREMITY;  Surgeon: Tennis Must, MD;  Location: [facility];  Service: Orthopedics;  Laterality: Left;  . OPEN REDUCTION INTERNAL FIXATION (ORIF) DISTAL RADIAL FRACTURE Right 02/04/2018   Procedure: OPEN  REDUCTION INTERNAL FIXATION (ORIF) RIGHT DISTAL RADIAL FRACTURE;  Surgeon: Leanora Cover, MD;  Location: [facility];  Service: Orthopedics;  Laterality: Right;    REVIEW OF SYSTEMS:  A comprehensive review of systems was negative.   PHYSICAL EXAMINATION: General appearance: alert, cooperative and no distress Head: Normocephalic, without obvious abnormality, atraumatic Neck: no adenopathy, no JVD, supple, symmetrical, trachea midline and thyroid not enlarged, symmetric, no tenderness/mass/nodules Lymph nodes: Cervical, supraclavicular, and axillary nodes normal. Resp: clear  to auscultation bilaterally Back: symmetric, no curvature. ROM normal. No CVA tenderness. Cardio: regular rate and rhythm, S1, S2 normal, no murmur, click, rub or gallop GI: soft, non-tender; bowel sounds normal; no masses,  no organomegaly Extremities: extremities normal, atraumatic, no cyanosis or edema  ECOG PERFORMANCE STATUS: 1 - Symptomatic but completely ambulatory  Blood pressure (!) 134/52, pulse 60, temperature 98.5 F (36.9 C), temperature source Temporal, resp. rate 20, height 5\' 2"  (1.575 m), weight 128 lb 6.4 oz (58.2 kg), SpO2 100 %.  LABORATORY DATA: Lab Results  Component Value Date   WBC 5.5 01/02/2020   HGB 9.9 (L) 01/02/2020   HCT 31.4 (L) 01/02/2020   MCV 102.3 (H) 01/02/2020   PLT 554 (H) 01/02/2020      Chemistry      Component Value Date/Time   NA 142 12/05/2019 1256   K 4.2 12/05/2019 1256   CL 107 12/05/2019 1256   CO2 27 12/05/2019 1256   BUN 18 12/05/2019 1256   CREATININE 1.05 (H) 12/05/2019 1256      Component Value Date/Time   CALCIUM 9.1 12/05/2019 1256   ALKPHOS 36 (L) 12/05/2019 1256   AST 17 12/05/2019 1256   ALT 10 12/05/2019 1256   BILITOT 0.7 12/05/2019 1256       RADIOGRAPHIC STUDIES: MM 3D SCREEN BREAST BILATERAL  Result Date: 12/27/2019 CLINICAL DATA:  Screening. EXAM: DIGITAL SCREENING BILATERAL MAMMOGRAM WITH TOMO AND CAD COMPARISON:  Previous exam(s). ACR Breast Density Category c: The breast tissue is heterogeneously dense, which may obscure small masses. FINDINGS: There are no findings suspicious for malignancy. Images were processed with CAD. IMPRESSION: No mammographic evidence of malignancy. A result letter of this screening mammogram will be mailed directly to the patient. RECOMMENDATION: Screening mammogram in one year. (Code:SM-B-01Y) BI-RADS CATEGORY  1: Negative. Electronically Signed   By: Lovey Newcomer M.D.   On: 12/27/2019 15:43    ASSESSMENT AND PLAN: This is a very pleasant 74 years old white female recently  diagnosed with essential thrombocythemia with positive JAK2 mutation.  The patient also has anemia and leukocytosis secondary to the myeloproliferative disorder. The patient is currently on treatment with hydroxyurea initially at 500 mg increased to 1000 mg but the patient could not tolerate the higher dose well and we switched her back to 500 mg p.o. daily. The patient is tolerating her treatment fairly well with no concerning adverse effects. CBC today showed improvement in the platelet count as well as her hemoglobin and hematocrit. I recommended for the patient to continue with the current dose of hydroxyurea 500 mg p.o. daily. I will see her back for follow-up visit in 4 weeks for evaluation with repeat blood work. She was advised to call immediately if she has any concerning symptoms in the interval.   The patient voices understanding of current disease status and treatment options and is in agreement with the current care plan.  All questions were answered. The patient knows to call the clinic with any problems, questions or concerns.  We can certainly see the patient much sooner if necessary.  Disclaimer: This note was dictated with voice recognition software. Similar sounding words can inadvertently be transcribed and may not be corrected upon review.

## 2020-01-03 ENCOUNTER — Encounter: Payer: Self-pay | Admitting: Cardiovascular Disease

## 2020-01-03 NOTE — Progress Notes (Signed)
Cardiology Office Note   Date:  01/03/2020   ID:  [place], [place] June 24, 1946, MRN [id number]  PCP:  [name], MD  Cardiologist:   Mertie Moores, MD   Chief Complaint  Patient presents with  . Coronary Artery Disease  . Hyperlipidemia   1. Coronary artery disease-status post PTCA and stenting of her left complex artery. She status post PTCA and stenting of her right coronary artery in [place] in 2005. 2. Hyperlipidemia 3. Hypertension 4. Bilateral DVT -  2015 , on anticoagulation   [name] [name] is a 74 y.o. female with the above noted hx. She has had lots of problems with her husband who has bipolar disease ( will not take his meds). Otherwise she is doing well. She is still walking some but needs to get back into it  Nov. 16, 2015:  [name] [name] is a 74 y.o. female who I see for hx of CAD - hx of PCI / stenting of her LCX She was found to have a DVT in both legs.  She was seen by Dr. Virgina Jock.  No clear etiology was found    Mar 19, 2015 : [name] [name] is a 74 y.o. female who presents for  CAD   She has done well.  She had DVT in Sept. 2015. Still on xarelto.   Nov. 15, 2016: Doing well.  Has some fatigue and chest tightness toward the end of the day. No CP with exertion.   Husband has been diagnosed with Parkinsons .  Lots of stress.   Mar 05, 2016:  [name] [name] is seen today  BP is a bit high.   Is not eating more salt than usual -   Oct. 30, 2017:  [name] [name] is seen today  No cardiac issues. No angina  Is frustrated with her husband who has Parkinsons and likely has some degree of dementia. He does not shower regularly   March 02, 2017:  [name] [name] is seen today for follow up visit for CAD, HTN,  and hyperlipidemia .  Is doing well .  No dyspnea. Has some fatigue at the end of the day which goes away after she relaxes.   Is not walking much  Able to mow her entire lawn without any problems.   January 03, 2020 [name] [name] is seen today for follow up of her CAD, HTN, and  hyperlpidemia  Seen with her daughter, [name] today  She has a history of essential thrombocythemia with a positive JAK2 mutation V617F  which was diagnosed October 2020.  She is seeing Dr. Mora Appl. Has anemia and elevated platelet counts.   2 units of PRBC on New Years day   Has rare episodes in CP when she is out in the cold  No exercise,  Has not been walking  Past Medical History:  Diagnosis Date  . Anemia   . Coronary artery disease    s/p stent to LAD and LCx  . DM II (diabetes mellitus, type II), controlled ([facility])   . DVT (deep venous thrombosis) ([facility])   . Elevated LFTs   . Hyperlipidemia   . Hypertension   . Myocardial infarction ([facility])    7 yrs ago    Past Surgical History:  Procedure Laterality Date  . CATARACT EXTRACTION    . CESAREAN SECTION  1983  . CORONARY ANGIOPLASTY WITH STENT PLACEMENT  04/26/2003   NORMAL. EF 65%  . I & D EXTREMITY  06/22/2012   Procedure: IRRIGATION AND DEBRIDEMENT EXTREMITY;  Surgeon: Lennette Bihari  [name] Cord, MD;  Location: [facility];  Service: Orthopedics;  Laterality: Left;  . I & D EXTREMITY  06/29/2012   Procedure: IRRIGATION AND DEBRIDEMENT EXTREMITY;  Surgeon: Tennis Must, MD;  Location: [facility];  Service: Orthopedics;  Laterality: Left;  . OPEN REDUCTION INTERNAL FIXATION (ORIF) DISTAL RADIAL FRACTURE Right 02/04/2018   Procedure: OPEN REDUCTION INTERNAL FIXATION (ORIF) RIGHT DISTAL RADIAL FRACTURE;  Surgeon: Leanora Cover, MD;  Location: [facility];  Service: Orthopedics;  Laterality: Right;     Current Outpatient Medications  Medication Sig Dispense Refill  . fenofibrate micronized (LOFIBRA) 200 MG capsule TAKE ONE CAPSULE BY MOUTH ONCE DAILY BEFORE BREAKFAST 90 capsule 2  . ferrous sulfate 325 (65 FE) MG EC tablet Take 325 mg by mouth daily.    . Fish Oil-Cholecalciferol (FISH OIL + D3) 1000-1000 MG-UNIT CAPS Take 1 tablet by mouth 3 (three) times daily.    . hydroxyurea (HYDREA) 500 MG capsule Take 1 capsule (500 mg total) by mouth  daily. May take with food to minimize GI side effects. 30 capsule 3  . metFORMIN (GLUCOPHAGE) 500 MG tablet Take 500 mg by mouth 2 (two) times daily with a meal.      . metoprolol tartrate (LOPRESSOR) 25 MG tablet Take 1 tablet (25 mg total) by mouth 2 (two) times daily. 180 tablet 3  . nitroGLYCERIN (NITROSTAT) 0.4 MG SL tablet Place 1 tablet (0.4 mg total) under the tongue every 5 (five) minutes as needed. For chest pain. Please make yearly appt with Dr. Acie Fredrickson. (Patient not taking: Reported on 01/02/2020) 25 tablet 0  . prochlorperazine (COMPAZINE) 10 MG tablet Take 1 tablet (10 mg total) by mouth every 6 (six) hours as needed for nausea or vomiting. 30 tablet 2  . rivaroxaban (XARELTO) 20 MG TABS tablet Take 20 mg by mouth daily with supper.    . rosuvastatin (CRESTOR) 20 MG tablet Take 10 mg by mouth daily.     No current facility-administered medications for this visit.    Allergies:   Ampicillin, Avelox [moxifloxacin hcl in nacl], Bactrim [sulfamethoxazole-trimethoprim], Ibuprofen, Penicillins, Codeine, and Latex    Social History:  The patient  reports that she has never smoked. She has never used smokeless tobacco. She reports current alcohol use. She reports that she does not use drugs.   Family History:  The patient's family history includes Aneurysm in her mother; Cirrhosis in her father; Dementia in her sister; Heart attack in her father and mother; Heart disease in her father and mother; Osteoarthritis in her sister; Stroke in her mother.    ROS:  Please see the history of present illness.     All other systems are reviewed and negative.   Physical Exam: There were no vitals taken for this visit.  GEN:  Well nourished, well developed in no acute distress HEENT: Normal NECK: No JVD;  Left carotid bruit LYMPHATICS: No lymphadenopathy CARDIAC: RRR  ,  Soft systolic mumur radiating to the RSB and possibly to carotids. RESPIRATORY:  Clear to auscultation without rales, wheezing  or rhonchi  ABDOMEN: Soft, non-tender, non-distended MUSCULOSKELETAL:  No edema; No deformity  SKIN: Warm and dry NEUROLOGIC:  Alert and oriented x 3   EKG: 01/04/2020: Normal sinus rhythm at 62.  Rare premature ventricular contractions.  No ST or T wave changes.  Recent Labs: 01/02/2020: ALT 12; BUN 18; Creatinine 0.86; Hemoglobin 9.9; Platelet Count 554; Potassium 4.0; Sodium 144    Lipid Panel No results found for: CHOL, TRIG,  HDL, CHOLHDL, VLDL, LDLCALC, LDLDIRECT    Wt Readings from Last 3 Encounters:  01/02/20 128 lb 6.4 oz (58.2 kg)  12/05/19 129 lb 3.2 oz (58.6 kg)  11/17/19 128 lb 8 oz (58.3 kg)      Other studies Reviewed: Additional studies/ records that were reviewed today include: . Review of the above records demonstrates:    ASSESSMENT AND PLAN:  1. Coronary artery disease-status post PTCA and stenting of her left complex artery.  She is not having any episodes of angina.  Continue current medications.  2. Hyperlipidemia-check lipids, liver enzymes, basic metabolic profile today.  3. Hypertension -blood pressure is well controlled.  4.DVT -she remains on Xarelto.  5.  Carotid bruit versus radiated systolic murmur: We will check a carotid duplex scan for her carotid bruit.  6.  Systolic murmur.  It sounds like she might have mild aortic stenosis.  Her pulse pressures are still good so I doubt she has significant aortic stenosis but we will get a baseline echocardiogram.  Current medicines are reviewed at length with the patient today.  The patient has concerns regarding medicines.  The following changes have been made:  no change  Labs/ tests ordered today include:  No orders of the defined types were placed in this encounter.    Disposition:   FU with me in 6 months with fasting labs.     Mertie Moores, MD  01/03/2020 5:57 AM    [address] [place] [place], [place], [place]  [address] Phone: [phone]; Fax: [phone]

## 2020-01-04 ENCOUNTER — Other Ambulatory Visit: Payer: Self-pay

## 2020-01-04 ENCOUNTER — Encounter: Payer: Self-pay | Admitting: Cardiovascular Disease

## 2020-01-04 ENCOUNTER — Ambulatory Visit (INDEPENDENT_AMBULATORY_CARE_PROVIDER_SITE_OTHER): Payer: Medicare Other | Admitting: Cardiovascular Disease

## 2020-01-04 VITALS — BP 122/52 | HR 62 | Ht 62.0 in | Wt 130.0 lb

## 2020-01-04 DIAGNOSIS — E782 Mixed hyperlipidemia: Secondary | ICD-10-CM | POA: Diagnosis not present

## 2020-01-04 DIAGNOSIS — I2510 Atherosclerotic heart disease of native coronary artery without angina pectoris: Secondary | ICD-10-CM

## 2020-01-04 DIAGNOSIS — R011 Cardiac murmur, unspecified: Secondary | ICD-10-CM | POA: Diagnosis not present

## 2020-01-04 DIAGNOSIS — R0989 Other specified symptoms and signs involving the circulatory and respiratory systems: Secondary | ICD-10-CM | POA: Diagnosis not present

## 2020-01-04 LAB — BASIC METABOLIC PANEL
BUN/Creatinine Ratio: 16 (ref 12–28)
BUN: 13 mg/dL (ref 8–27)
CO2: 24 mmol/L (ref 20–29)
Calcium: 9.6 mg/dL (ref 8.7–10.3)
Chloride: 107 mmol/L — ABNORMAL HIGH (ref 96–106)
Creatinine, Ser: 0.82 mg/dL (ref 0.57–1.00)
GFR calc Af Amer: 82 mL/min/{1.73_m2} (ref >59)
GFR calc non Af Amer: 71 mL/min/{1.73_m2} (ref >59)
Glucose: 83 mg/dL (ref 65–99)
Potassium: 4.9 mmol/L (ref 3.5–5.2)
Sodium: 146 mmol/L — ABNORMAL HIGH (ref 134–144)

## 2020-01-04 LAB — LIPID PANEL
Chol/HDL Ratio: 2.6 ratio (ref 0.0–4.4)
Cholesterol, Total: 101 mg/dL (ref 100–199)
HDL: 39 mg/dL — ABNORMAL LOW (ref >39)
LDL Chol Calc (NIH): 45 mg/dL (ref 0–99)
Triglycerides: 83 mg/dL (ref 0–149)
VLDL Cholesterol Cal: 17 mg/dL (ref 5–40)

## 2020-01-04 LAB — HEPATIC FUNCTION PANEL
ALT: 9 IU/L (ref 0–32)
AST: 19 IU/L (ref 0–40)
Albumin: 4.8 g/dL — ABNORMAL HIGH (ref 3.7–4.7)
Alkaline Phosphatase: 36 IU/L — ABNORMAL LOW (ref 39–117)
Bilirubin Total: 0.5 mg/dL (ref 0.0–1.2)
Bilirubin, Direct: 0.18 mg/dL (ref 0.00–0.40)
Total Protein: 6 g/dL (ref 6.0–8.5)

## 2020-01-04 MED ORDER — NITROGLYCERIN 0.4 MG SL SUBL: 0.4000 mg | SUBLINGUAL_TABLET | SUBLINGUAL | 0 refills | Status: AC | PRN

## 2020-01-04 NOTE — Patient Instructions (Signed)
Medication Instructions:   Your physician recommends that you continue on your current medications as directed. Please refer to the Current Medication list given to you today.  *If you need a refill on your cardiac medications before your next appointment, please call your pharmacy*   Lab Work:  TODAY--BMET, LFTs, AND LIPIDS  If you have labs (blood work) drawn today and your tests are completely normal, you will receive your results only by: Marland Kitchen MyChart Message (if you have MyChart) OR . A paper copy in the mail If you have any lab test that is abnormal or we need to change your treatment, we will call you to review the results.   Testing/Procedures:  Your physician has requested that you have a carotid duplex. This test is an ultrasound of the carotid arteries in your neck. It looks at blood flow through these arteries that supply the brain with blood. Allow one hour for this exam. There are no restrictions or special instructions.  Your physician has requested that you have an echocardiogram. Echocardiography is a painless test that uses sound waves to create images of your heart. It provides your doctor with information about the size and shape of your heart and how well your heart's chambers and valves are working. This procedure takes approximately one hour. There are no restrictions for this procedure.   Follow-Up: At [facility], you and your health needs are our priority.  As part of our continuing mission to provide you with exceptional heart care, we have created designated Provider Care Teams.  These Care Teams include your primary Cardiologist (physician) and Advanced Practice Providers (APPs -  Physician Assistants and Nurse Practitioners) who all work together to provide you with the care you need, when you need it.  We recommend signing up for the patient portal called "MyChart".  Sign up information is provided on this After Visit Summary.  MyChart is used to connect with  patients for Virtual Visits (Telemedicine).  Patients are able to view lab/test results, encounter notes, upcoming appointments, etc.  Non-urgent messages can be sent to your provider as well.   To learn more about what you can do with MyChart, go to [email or web address].    Your next appointment:   12 month(s)  The format for your next appointment:   In Person  Provider:   Mertie Moores, MD

## 2020-01-20 ENCOUNTER — Ambulatory Visit (HOSPITAL_BASED_OUTPATIENT_CLINIC_OR_DEPARTMENT_OTHER): Payer: Medicare Other

## 2020-01-20 ENCOUNTER — Other Ambulatory Visit: Payer: Self-pay

## 2020-01-20 ENCOUNTER — Ambulatory Visit (HOSPITAL_COMMUNITY)
Admission: RE | Admit: 2020-01-20 | Discharge: 2020-01-20 | Disposition: A | Discharge status: Home / Self Care | Payer: Medicare Other | Source: Ambulatory Visit | Attending: Cardiology | Admitting: Cardiology

## 2020-01-20 DIAGNOSIS — R0989 Other specified symptoms and signs involving the circulatory and respiratory systems: Secondary | ICD-10-CM | POA: Diagnosis not present

## 2020-01-20 DIAGNOSIS — R011 Cardiac murmur, unspecified: Secondary | ICD-10-CM | POA: Insufficient documentation

## 2020-01-25 DIAGNOSIS — Z23 Encounter for immunization: Secondary | ICD-10-CM | POA: Diagnosis not present

## 2020-01-31 ENCOUNTER — Other Ambulatory Visit: Payer: Self-pay

## 2020-01-31 ENCOUNTER — Inpatient Hospital Stay (HOSPITAL_BASED_OUTPATIENT_CLINIC_OR_DEPARTMENT_OTHER): Payer: Medicare Other | Admitting: Internal Medicine

## 2020-01-31 ENCOUNTER — Inpatient Hospital Stay: Payer: Medicare Other

## 2020-01-31 ENCOUNTER — Encounter: Payer: Self-pay | Admitting: Internal Medicine

## 2020-01-31 VITALS — BP 134/46 | HR 91 | Temp 98.3°F | Resp 18 | Ht 62.0 in | Wt 129.7 lb

## 2020-01-31 DIAGNOSIS — I10 Essential (primary) hypertension: Secondary | ICD-10-CM | POA: Diagnosis not present

## 2020-01-31 DIAGNOSIS — D473 Essential (hemorrhagic) thrombocythemia: Secondary | ICD-10-CM

## 2020-01-31 DIAGNOSIS — E119 Type 2 diabetes mellitus without complications: Secondary | ICD-10-CM | POA: Diagnosis not present

## 2020-01-31 DIAGNOSIS — R948 Abnormal results of function studies of other organs and systems: Secondary | ICD-10-CM | POA: Diagnosis not present

## 2020-01-31 DIAGNOSIS — C946 Myelodysplastic disease, not classified: Secondary | ICD-10-CM | POA: Diagnosis not present

## 2020-01-31 DIAGNOSIS — D649 Anemia, unspecified: Secondary | ICD-10-CM | POA: Diagnosis not present

## 2020-01-31 DIAGNOSIS — I2510 Atherosclerotic heart disease of native coronary artery without angina pectoris: Secondary | ICD-10-CM

## 2020-01-31 DIAGNOSIS — Z5111 Encounter for antineoplastic chemotherapy: Secondary | ICD-10-CM | POA: Diagnosis not present

## 2020-01-31 LAB — CBC WITH DIFFERENTIAL (CANCER CENTER ONLY)
Abs Immature Granulocytes: 0.03 10*3/uL (ref 0.00–0.07)
Basophils Absolute: 0 10*3/uL (ref 0.0–0.1)
Basophils Relative: 1 %
Eosinophils Absolute: 0.1 10*3/uL (ref 0.0–0.5)
Eosinophils Relative: 1 %
HCT: 28.5 % — ABNORMAL LOW (ref 36.0–46.0)
Hemoglobin: 8.9 g/dL — ABNORMAL LOW (ref 12.0–15.0)
Immature Granulocytes: 1 %
Lymphocytes Relative: 17 %
Lymphs Abs: 0.8 10*3/uL (ref 0.7–4.0)
MCH: 35.3 pg — ABNORMAL HIGH (ref 26.0–34.0)
MCHC: 31.2 g/dL (ref 30.0–36.0)
MCV: 113.1 fL — ABNORMAL HIGH (ref 80.0–100.0)
Monocytes Absolute: 0.2 10*3/uL (ref 0.1–1.0)
Monocytes Relative: 3 %
Neutro Abs: 3.9 10*3/uL (ref 1.7–7.7)
Neutrophils Relative %: 77 %
Platelet Count: 592 10*3/uL — ABNORMAL HIGH (ref 150–400)
RBC: 2.52 MIL/uL — ABNORMAL LOW (ref 3.87–5.11)
WBC Count: 5 10*3/uL (ref 4.0–10.5)
nRBC: 0 % (ref 0.0–0.2)

## 2020-01-31 LAB — CMP (CANCER CENTER ONLY)
ALT: 11 U/L (ref 0–44)
AST: 20 U/L (ref 15–41)
Albumin: 4 g/dL (ref 3.5–5.0)
Alkaline Phosphatase: 31 U/L — ABNORMAL LOW (ref 38–126)
Anion gap: 7 (ref 5–15)
BUN: 13 mg/dL (ref 8–23)
CO2: 26 mmol/L (ref 22–32)
Calcium: 9.2 mg/dL (ref 8.9–10.3)
Chloride: 109 mmol/L (ref 98–111)
Creatinine: 0.93 mg/dL (ref 0.44–1.00)
GFR, Est AFR Am: >60 mL/min (ref >60)
GFR, Estimated: >60 mL/min (ref >60)
Glucose, Bld: 91 mg/dL (ref 70–99)
Potassium: 4.3 mmol/L (ref 3.5–5.1)
Sodium: 142 mmol/L (ref 135–145)
Total Bilirubin: 0.6 mg/dL (ref 0.3–1.2)
Total Protein: 6 g/dL — ABNORMAL LOW (ref 6.5–8.1)

## 2020-01-31 LAB — LACTATE DEHYDROGENASE: LDH: 283 U/L — ABNORMAL HIGH (ref 98–192)

## 2020-01-31 NOTE — Progress Notes (Signed)
[place] [phone]   [phone]  OFFICE PROGRESS NOTE  [name], MD [place] [address]  DIAGNOSIS: Essential thrombocythemia with positive JAK2 mutation [id number] diagnosed in October 2020.  PRIOR THERAPY: None  CURRENT THERAPY: Hydroxyurea 500 mg p.o. daily.   INTERVAL HISTORY: [name] [name] 74 y.o. female returns to the clinic today for follow-up visit accompanied by her son.  The patient is feeling fine today with no concerning complaints.  She had ultrasound of the carotids as well as 2D echo recently that were unremarkable for any major issues.  She denied having any current chest pain, shortness of breath, cough or hemoptysis.  She denied having any fever or chills.  She has no nausea, vomiting, diarrhea or constipation.  She denied having any headache or visual changes.  She is currently on treatment with hydroxyurea and tolerating it fairly well.  She had repeat CBC, comprehensive metabolic panel and LDH performed earlier today and she is here for evaluation and discussion of her lab results.   MEDICAL HISTORY: Past Medical History:  Diagnosis Date   Anemia    Coronary artery disease    s/p stent to LAD and LCx   DM II (diabetes mellitus, type II), controlled (HCC)    DVT (deep venous thrombosis) (HCC)    Elevated LFTs    Hyperlipidemia    Hypertension    Myocardial infarction ([place])    7 yrs ago    ALLERGIES:  is allergic to ampicillin; avelox [moxifloxacin hcl in nacl]; bactrim [sulfamethoxazole-trimethoprim]; ibuprofen; penicillins; codeine; and latex.  MEDICATIONS:  Current Outpatient Medications  Medication Sig Dispense Refill   fenofibrate micronized (LOFIBRA) 200 MG capsule TAKE ONE CAPSULE BY MOUTH ONCE DAILY BEFORE BREAKFAST 90 capsule 2   ferrous sulfate 325 (65 FE) MG EC tablet Take 325 mg by mouth daily.     Fish Oil-Cholecalciferol (FISH OIL + D3) 1000-1000 MG-UNIT CAPS Take 1  tablet by mouth 3 (three) times daily.     hydroxyurea (HYDREA) 500 MG capsule Take 1 capsule (500 mg total) by mouth daily. May take with food to minimize GI side effects. 30 capsule 3   metFORMIN (GLUCOPHAGE) 500 MG tablet Take 500 mg by mouth 2 (two) times daily with a meal.       metoprolol tartrate (LOPRESSOR) 25 MG tablet Take 1 tablet (25 mg total) by mouth 2 (two) times daily. 180 tablet 3   nitroGLYCERIN (NITROSTAT) 0.4 MG SL tablet Place 1 tablet (0.4 mg total) under the tongue every 5 (five) minutes as needed. For chest pain. 25 tablet 0   prochlorperazine (COMPAZINE) 10 MG tablet Take 1 tablet (10 mg total) by mouth every 6 (six) hours as needed for nausea or vomiting. 30 tablet 2   rivaroxaban (XARELTO) 20 MG TABS tablet Take 20 mg by mouth daily with supper.     rosuvastatin (CRESTOR) 20 MG tablet Take 10 mg by mouth daily.     No current facility-administered medications for this visit.    SURGICAL HISTORY:  Past Surgical History:  Procedure Laterality Date   CATARACT EXTRACTION     CESAREAN SECTION  1983   CORONARY ANGIOPLASTY WITH STENT PLACEMENT  04/26/2003   NORMAL. EF 65%   I & D EXTREMITY  06/22/2012   Procedure: IRRIGATION AND DEBRIDEMENT EXTREMITY;  Surgeon: Tennis Must, MD;  Location: [facility];  Service: Orthopedics;  Laterality: Left;   I & D EXTREMITY  06/29/2012  Procedure: IRRIGATION AND DEBRIDEMENT EXTREMITY;  Surgeon: Tennis Must, MD;  Location: [place];  Service: Orthopedics;  Laterality: Left;   OPEN REDUCTION INTERNAL FIXATION (ORIF) DISTAL RADIAL FRACTURE Right 02/04/2018   Procedure: OPEN REDUCTION INTERNAL FIXATION (ORIF) RIGHT DISTAL RADIAL FRACTURE;  Surgeon: Leanora Cover, MD;  Location: [facility];  Service: Orthopedics;  Laterality: Right;    REVIEW OF SYSTEMS:  A comprehensive review of systems was negative except for: Constitutional: positive for fatigue   PHYSICAL EXAMINATION: General appearance: alert, cooperative and no  distress Head: Normocephalic, without obvious abnormality, atraumatic Neck: no adenopathy, no JVD, supple, symmetrical, trachea midline and thyroid not enlarged, symmetric, no tenderness/mass/nodules Lymph nodes: Cervical, supraclavicular, and axillary nodes normal. Resp: clear to auscultation bilaterally Back: symmetric, no curvature. ROM normal. No CVA tenderness. Cardio: regular rate and rhythm, S1, S2 normal, no murmur, click, rub or gallop GI: soft, non-tender; bowel sounds normal; no masses,  no organomegaly Extremities: extremities normal, atraumatic, no cyanosis or edema  ECOG PERFORMANCE STATUS: 1 - Symptomatic but completely ambulatory  Blood pressure (!) 134/46, pulse 91, temperature 98.3 F (36.8 C), temperature source Oral, resp. rate 18, height 5\' 2"  (1.575 m), weight 129 lb 11.2 oz (58.8 kg), SpO2 99 %.  LABORATORY DATA: Lab Results  Component Value Date   WBC 5.0 01/31/2020   HGB 8.9 (L) 01/31/2020   HCT 28.5 (L) 01/31/2020   MCV 113.1 (H) 01/31/2020   PLT 592 (H) 01/31/2020      Chemistry      Component Value Date/Time   NA 146 (H) 01/04/2020 1006   K 4.9 01/04/2020 1006   CL 107 (H) 01/04/2020 1006   CO2 24 01/04/2020 1006   BUN 13 01/04/2020 1006   CREATININE 0.82 01/04/2020 1006   CREATININE 0.86 01/02/2020 1313      Component Value Date/Time   CALCIUM 9.6 01/04/2020 1006   ALKPHOS 36 (L) 01/04/2020 1006   AST 19 01/04/2020 1006   AST 19 01/02/2020 1313   ALT 9 01/04/2020 1006   ALT 12 01/02/2020 1313   BILITOT 0.5 01/04/2020 1006   BILITOT 0.6 01/02/2020 1313       RADIOGRAPHIC STUDIES: ECHOCARDIOGRAM COMPLETE  Result Date: 01/20/2020    ECHOCARDIOGRAM REPORT   Patient Name:   [name] [name] Date of Exam: 01/20/2020 Medical Rec #:  [id number]           Height:       62.0 in Accession #:    [id number]          Weight:       130.0 lb Date of Birth:  1946/02/05           BSA:          1.592 m Patient Age:    77 years            BP:            122/52 mmHg Patient Gender: F                   HR:           60 bpm. Exam Location:  [place] Procedure: 2D Echo, Cardiac Doppler and Color Doppler Indications:    R01.1 Murmur  History:        Patient has no prior history of Echocardiogram examinations. CAD                 and Previous Myocardial Infarction, DVT, Signs/Symptoms:Murmur;  Risk Factors:Hypertension, Diabetes and Dyslipidemia. Anemia.  Sonographer:    Jessee Avers, RDCS Referring Phys: Thayer Headings IMPRESSIONS  1. Left ventricular ejection fraction, by estimation, is 60 to 65%. The left ventricle has normal function. The left ventricle has no regional wall motion abnormalities. The left ventricular internal cavity size was mildly to moderately dilated. Left ventricular diastolic parameters were normal.  2. Right ventricular systolic function is low normal. The right ventricular size is normal.  3. The mitral valve is abnormal. Mild mitral valve regurgitation.  4. AV is thickened, calcified with restricted motion. Peak and mean gradients through AV are 33 and 19 mm Hg respectively. LVOT/AV VTI is 0.5 consistent with mild AS. 2 D images, valve is heavily calcified  5. The inferior vena cava is normal in size with greater than 50% respiratory variability, suggesting right atrial pressure of 3 mmHg. FINDINGS  Left Ventricle: Left ventricular ejection fraction, by estimation, is 60 to 65%. The left ventricle has normal function. The left ventricle has no regional wall motion abnormalities. The left ventricular internal cavity size was mildly to moderately dilated. There is no left ventricular hypertrophy. Left ventricular diastolic parameters were normal. Right Ventricle: The right ventricular size is normal. No increase in right ventricular wall thickness. Right ventricular systolic function is low normal. Left Atrium: Left atrial size was normal in size. Right Atrium: Right atrial size was normal in size. Pericardium: There is no  evidence of pericardial effusion. Mitral Valve: The mitral valve is abnormal. There is mild thickening of the mitral valve leaflet(s). Mild mitral valve regurgitation. Tricuspid Valve: The tricuspid valve is normal in structure. Tricuspid valve regurgitation is mild. Aortic Valve: The aortic valve is grossly normal. Aortic valve regurgitation is not visualized. Mild aortic stenosis is present. Aortic valve mean gradient measures 18.2 mmHg. Aortic valve peak gradient measures 30.4 mmHg. Aortic valve area, by VTI measures 1.57 cm. Pulmonic Valve: The pulmonic valve was normal in structure. Pulmonic valve regurgitation is not visualized. Aorta: The aortic root is normal in size and structure. Venous: The inferior vena cava is normal in size with greater than 50% respiratory variability, suggesting right atrial pressure of 3 mmHg. IAS/Shunts: No atrial level shunt detected by color flow Doppler.  LEFT VENTRICLE PLAX 2D LVIDd:         5.65 cm  Diastology LVIDs:         4.05 cm  LV e' lateral:   10.50 cm/s LV PW:         0.90 cm  LV E/e' lateral: 11.2 LV IVS:        0.95 cm  LV e' medial:    4.80 cm/s LVOT diam:     2.00 cm  LV E/e' medial:  24.6 LV SV:         116 LV SV Index:   73 LVOT Area:     3.14 cm  RIGHT VENTRICLE RV Basal diam:  3.50 cm RV S prime:     11.30 cm/s TAPSE (M-mode): 2.5 cm LEFT ATRIUM             Index       RIGHT ATRIUM           Index LA diam:        4.60 cm 2.89 cm/m  RA Pressure: 3.00 mmHg LA Vol (A2C):   43.8 ml 27.51 ml/m RA Area:     12.60 cm LA Vol (A4C):   49.1 ml 30.84 ml/m RA Volume:  29.70 ml  18.66 ml/m LA Biplane Vol: 47.7 ml 29.96 ml/m  AORTIC VALVE AV Area (Vmax):    1.65 cm AV Area (Vmean):   1.56 cm AV Area (VTI):     1.57 cm AV Vmax:           275.60 cm/s AV Vmean:          201.800 cm/s AV VTI:            0.741 m AV Peak Grad:      30.4 mmHg AV Mean Grad:      18.2 mmHg LVOT Vmax:         145.00 cm/s LVOT Vmean:        100.000 cm/s LVOT VTI:          0.370 m LVOT/AV VTI  ratio: 0.50  AORTA Ao Root diam: 2.80 cm Ao Asc diam:  2.90 cm MITRAL VALVE                TRICUSPID VALVE                             Estimated RAP:  3.00 mmHg  MV E velocity: 118.00 cm/s  SHUNTS MV A velocity: 112.00 cm/s  Systemic VTI:  0.37 m MV E/A ratio:  1.05         Systemic Diam: 2.00 cm Dorris Carnes MD Electronically signed by Dorris Carnes MD Signature Date/Time: 01/20/2020/11:23:32 PM    Final    VAS US CAROTID  Result Date: 01/20/2020 Carotid Arterial Duplex Study Indications:       Left bruit. Risk Factors:      Hypertension, hyperlipidemia, no history of smoking, prior                    MI, coronary artery disease. Other Factors:     She has a history of essential thrombocythemia with a                    positive JAK2 mutation V617F which was diagnosed October                    2020. Comparison Study:  None Performing Technologist: [employer] BS, RVT, RDCS  Examination Guidelines: A complete evaluation includes B-mode imaging, spectral Doppler, color Doppler, and power Doppler as needed of all accessible portions of each vessel. Bilateral testing is considered an integral part of a complete examination. Limited examinations for reoccurring indications may be performed as noted.  Right Carotid Findings: +----------+--------+--------+--------+--------------------------+--------+             PSV cm/s EDV cm/s Stenosis Plaque Description         Comments  +----------+--------+--------+--------+--------------------------+--------+  CCA Prox   88       7                                            tortuous  +----------+--------+--------+--------+--------------------------+--------+  CCA Distal 75       10                                                     +----------+--------+--------+--------+--------------------------+--------+  ICA Prox  86       17                heterogenous and irregular           +----------+--------+--------+--------+--------------------------+--------+  ICA Mid    107      18                                                      +----------+--------+--------+--------+--------------------------+--------+  ICA Distal 74       20                                                     +----------+--------+--------+--------+--------------------------+--------+  ECA        101      2                                                      +----------+--------+--------+--------+--------------------------+--------+ +----------+--------+-------+----------------+-------------------+             PSV cm/s EDV cms Describe         Arm Pressure (mmHG)  +----------+--------+-------+----------------+-------------------+  Subclavian 101              Multiphasic, WNL 122                  +----------+--------+-------+----------------+-------------------+ +---------+--------+--+--------+--+---------+  Vertebral PSV cm/s 62 EDV cm/s 14 Antegrade  +---------+--------+--+--------+--+---------+ Plaque seen in the vertebral artery, appears smooth. Left Carotid Findings: +----------+--------+--------+--------+------------------+--------+             PSV cm/s EDV cm/s Stenosis Plaque Description Comments  +----------+--------+--------+--------+------------------+--------+  CCA Prox   88       17                                   tortuous  +----------+--------+--------+--------+------------------+--------+  CCA Distal 68       15                                             +----------+--------+--------+--------+------------------+--------+  ICA Prox   60       16                smooth                       +----------+--------+--------+--------+------------------+--------+  ICA Mid    54       14                                             +----------+--------+--------+--------+------------------+--------+  ICA Distal 129      28                                             +----------+--------+--------+--------+------------------+--------+  ECA        91       8                                               +----------+--------+--------+--------+------------------+--------+ +----------+--------+--------+----------------+-------------------+             PSV cm/s EDV cm/s Describe         Arm Pressure (mmHG)  +----------+--------+--------+----------------+-------------------+  Subclavian 196               Multiphasic, WNL 128                  +----------+--------+--------+----------------+-------------------+ +---------+--------+--+--------+-+---------+  Vertebral PSV cm/s 61 EDV cm/s 6 Antegrade  +---------+--------+--+--------+-+---------+   Summary: Right Carotid: Velocities in the right ICA are consistent with a 1-39% stenosis. Left Carotid: The extracranial vessels were near-normal with only minimal wall               thickening or plaque. Vertebrals:  Bilateral vertebral arteries demonstrate antegrade flow. Subclavians: Normal flow hemodynamics were seen in bilateral subclavian              arteries. *See table(s) above for measurements and observations.  Electronically signed by Quay Burow MD on 01/20/2020 at 5:37:38 PM.    Final     ASSESSMENT AND PLAN: This is a very pleasant 74 years old white female recently diagnosed with essential thrombocythemia with positive JAK2 mutation.  The patient also has anemia and leukocytosis secondary to the myeloproliferative disorder. The patient is currently on treatment with hydroxyurea initially at 500 mg increased to 1000 mg but the patient could not tolerate the higher dose well and we switched her back to 500 mg p.o. daily. The patient has been tolerating this treatment well with no concerning adverse effects. CBC today showed persistent anemia and platelets count are 592,000. I recommended for her to continue her current treatment with hydroxyurea with the same dose for now. I will see her back for follow-up visit in 1 months for evaluation and repeat CBC, comprehensive metabolic panel and LDH. The patient was advised to call immediately if she has any  concerning symptoms in the interval. The patient voices understanding of current disease status and treatment options and is in agreement with the current care plan.  All questions were answered. The patient knows to call the clinic with any problems, questions or concerns. We can certainly see the patient much sooner if necessary.  Disclaimer: This note was dictated with voice recognition software. Similar sounding words can inadvertently be transcribed and may not be corrected upon review.

## 2020-02-01 ENCOUNTER — Telehealth: Payer: Self-pay | Admitting: Internal Medicine

## 2020-02-01 NOTE — Telephone Encounter (Signed)
Scheduled per los. Called, no answer, not able to leave msg. Mailed printout  

## 2020-02-22 DIAGNOSIS — Z23 Encounter for immunization: Secondary | ICD-10-CM | POA: Diagnosis not present

## 2020-03-07 ENCOUNTER — Inpatient Hospital Stay: Payer: Medicare Other | Attending: Physician Assistant | Admitting: Internal Medicine

## 2020-03-07 ENCOUNTER — Other Ambulatory Visit: Payer: Self-pay

## 2020-03-07 ENCOUNTER — Encounter: Payer: Self-pay | Admitting: Internal Medicine

## 2020-03-07 ENCOUNTER — Inpatient Hospital Stay: Payer: Medicare Other

## 2020-03-07 VITALS — BP 137/63 | HR 63 | Temp 98.2°F | Resp 17 | Ht 62.0 in | Wt 128.9 lb

## 2020-03-07 DIAGNOSIS — Z86718 Personal history of other venous thrombosis and embolism: Secondary | ICD-10-CM | POA: Diagnosis not present

## 2020-03-07 DIAGNOSIS — Z7901 Long term (current) use of anticoagulants: Secondary | ICD-10-CM | POA: Insufficient documentation

## 2020-03-07 DIAGNOSIS — Z5111 Encounter for antineoplastic chemotherapy: Secondary | ICD-10-CM | POA: Diagnosis not present

## 2020-03-07 DIAGNOSIS — E785 Hyperlipidemia, unspecified: Secondary | ICD-10-CM | POA: Diagnosis not present

## 2020-03-07 DIAGNOSIS — D473 Essential (hemorrhagic) thrombocythemia: Secondary | ICD-10-CM | POA: Diagnosis not present

## 2020-03-07 DIAGNOSIS — I10 Essential (primary) hypertension: Secondary | ICD-10-CM | POA: Diagnosis not present

## 2020-03-07 DIAGNOSIS — E119 Type 2 diabetes mellitus without complications: Secondary | ICD-10-CM | POA: Diagnosis not present

## 2020-03-07 DIAGNOSIS — D649 Anemia, unspecified: Secondary | ICD-10-CM | POA: Diagnosis not present

## 2020-03-07 DIAGNOSIS — I2510 Atherosclerotic heart disease of native coronary artery without angina pectoris: Secondary | ICD-10-CM | POA: Insufficient documentation

## 2020-03-07 DIAGNOSIS — Z7984 Long term (current) use of oral hypoglycemic drugs: Secondary | ICD-10-CM | POA: Insufficient documentation

## 2020-03-07 DIAGNOSIS — I252 Old myocardial infarction: Secondary | ICD-10-CM | POA: Diagnosis not present

## 2020-03-07 DIAGNOSIS — Z79899 Other long term (current) drug therapy: Secondary | ICD-10-CM | POA: Diagnosis not present

## 2020-03-07 LAB — CMP (CANCER CENTER ONLY)
ALT: 11 U/L (ref 0–44)
AST: 19 U/L (ref 15–41)
Albumin: 4.1 g/dL (ref 3.5–5.0)
Alkaline Phosphatase: 31 U/L — ABNORMAL LOW (ref 38–126)
Anion gap: 7 (ref 5–15)
BUN: 17 mg/dL (ref 8–23)
CO2: 28 mmol/L (ref 22–32)
Calcium: 9.3 mg/dL (ref 8.9–10.3)
Chloride: 109 mmol/L (ref 98–111)
Creatinine: 0.86 mg/dL (ref 0.44–1.00)
GFR, Est AFR Am: >60 mL/min (ref >60)
GFR, Estimated: >60 mL/min (ref >60)
Glucose, Bld: 93 mg/dL (ref 70–99)
Potassium: 4.4 mmol/L (ref 3.5–5.1)
Sodium: 144 mmol/L (ref 135–145)
Total Bilirubin: 0.6 mg/dL (ref 0.3–1.2)
Total Protein: 6.2 g/dL — ABNORMAL LOW (ref 6.5–8.1)

## 2020-03-07 LAB — CBC WITH DIFFERENTIAL (CANCER CENTER ONLY)
Abs Immature Granulocytes: 0.02 10*3/uL (ref 0.00–0.07)
Basophils Absolute: 0 10*3/uL (ref 0.0–0.1)
Basophils Relative: 1 %
Eosinophils Absolute: 0.1 10*3/uL (ref 0.0–0.5)
Eosinophils Relative: 2 %
HCT: 31.3 % — ABNORMAL LOW (ref 36.0–46.0)
Hemoglobin: 10.1 g/dL — ABNORMAL LOW (ref 12.0–15.0)
Immature Granulocytes: 1 %
Lymphocytes Relative: 15 %
Lymphs Abs: 0.7 10*3/uL (ref 0.7–4.0)
MCH: 37.8 pg — ABNORMAL HIGH (ref 26.0–34.0)
MCHC: 32.3 g/dL (ref 30.0–36.0)
MCV: 117.2 fL — ABNORMAL HIGH (ref 80.0–100.0)
Monocytes Absolute: 0.2 10*3/uL (ref 0.1–1.0)
Monocytes Relative: 4 %
Neutro Abs: 3.4 10*3/uL (ref 1.7–7.7)
Neutrophils Relative %: 77 %
Platelet Count: 526 10*3/uL — ABNORMAL HIGH (ref 150–400)
RBC: 2.67 MIL/uL — ABNORMAL LOW (ref 3.87–5.11)
RDW: 18 % — ABNORMAL HIGH (ref 11.5–15.5)
WBC Count: 4.3 10*3/uL (ref 4.0–10.5)
nRBC: 0.5 % — ABNORMAL HIGH (ref 0.0–0.2)

## 2020-03-07 LAB — LACTATE DEHYDROGENASE: LDH: 249 U/L — ABNORMAL HIGH (ref 98–192)

## 2020-03-07 NOTE — Progress Notes (Signed)
[facility] Telephone:([phone]   Fax:([phone]  OFFICE PROGRESS NOTE  [name], MD [place] [address]  DIAGNOSIS: Essential thrombocythemia with positive JAK2 mutation V617F diagnosed in October 2020.  PRIOR THERAPY: None  CURRENT THERAPY: Hydroxyurea 500 mg p.o. daily.   INTERVAL HISTORY: [name] [name] 74 y.o. female returns to the clinic today for follow-up visit accompanied by her daughter [name] [name].  The patient unfortunately lost her son to a heart attack few weeks ago.  She is feeling fine today with no significant fatigue or weakness.  She denied having any current chest pain, shortness of breath, cough or hemoptysis.  She denied having any fever or chills.  She has no nausea, vomiting, diarrhea or constipation.  She denied having any headache or visual changes.  She is here today for evaluation and repeat blood work.   MEDICAL HISTORY: Past Medical History:  Diagnosis Date  . Anemia   . Coronary artery disease    s/p stent to LAD and LCx  . DM II (diabetes mellitus, type II), controlled ([facility])   . DVT (deep venous thrombosis) ([facility])   . Elevated LFTs   . Hyperlipidemia   . Hypertension   . Myocardial infarction ([facility])    7 yrs ago    ALLERGIES:  is allergic to ampicillin; avelox [moxifloxacin hcl in nacl]; bactrim [sulfamethoxazole-trimethoprim]; ibuprofen; penicillins; codeine; and latex.  MEDICATIONS:  Current Outpatient Medications  Medication Sig Dispense Refill  . fenofibrate micronized (LOFIBRA) 200 MG capsule TAKE ONE CAPSULE BY MOUTH ONCE DAILY BEFORE BREAKFAST 90 capsule 2  . ferrous sulfate 325 (65 FE) MG EC tablet Take 325 mg by mouth daily.    . Fish Oil-Cholecalciferol (FISH OIL + D3) 1000-1000 MG-UNIT CAPS Take 1 tablet by mouth 3 (three) times daily.    . hydroxyurea (HYDREA) 500 MG capsule Take 1 capsule (500 mg total) by mouth daily. May take with food to minimize GI side effects. 30 capsule 3  .  metFORMIN (GLUCOPHAGE) 500 MG tablet Take 500 mg by mouth 2 (two) times daily with a meal.      . metoprolol tartrate (LOPRESSOR) 25 MG tablet Take 1 tablet (25 mg total) by mouth 2 (two) times daily. 180 tablet 3  . nitroGLYCERIN (NITROSTAT) 0.4 MG SL tablet Place 1 tablet (0.4 mg total) under the tongue every 5 (five) minutes as needed. For chest pain. 25 tablet 0  . prochlorperazine (COMPAZINE) 10 MG tablet Take 1 tablet (10 mg total) by mouth every 6 (six) hours as needed for nausea or vomiting. 30 tablet 2  . rivaroxaban (XARELTO) 20 MG TABS tablet Take 20 mg by mouth daily with supper.    . rosuvastatin (CRESTOR) 20 MG tablet Take 10 mg by mouth daily.     No current facility-administered medications for this visit.    SURGICAL HISTORY:  Past Surgical History:  Procedure Laterality Date  . CATARACT EXTRACTION    . CESAREAN SECTION  1983  . CORONARY ANGIOPLASTY WITH STENT PLACEMENT  04/26/2003   NORMAL. EF 65%  . I & D EXTREMITY  06/22/2012   Procedure: IRRIGATION AND DEBRIDEMENT EXTREMITY;  Surgeon: Tennis Must, MD;  Location: [facility];  Service: Orthopedics;  Laterality: Left;  . I & D EXTREMITY  06/29/2012   Procedure: IRRIGATION AND DEBRIDEMENT EXTREMITY;  Surgeon: Tennis Must, MD;  Location: [facility];  Service: Orthopedics;  Laterality: Left;  . OPEN REDUCTION INTERNAL FIXATION (ORIF) DISTAL RADIAL  FRACTURE Right 02/04/2018   Procedure: OPEN REDUCTION INTERNAL FIXATION (ORIF) RIGHT DISTAL RADIAL FRACTURE;  Surgeon: Leanora Cover, MD;  Location: [facility];  Service: Orthopedics;  Laterality: Right;    REVIEW OF SYSTEMS:  A comprehensive review of systems was negative.   PHYSICAL EXAMINATION: General appearance: alert, cooperative and no distress Head: Normocephalic, without obvious abnormality, atraumatic Neck: no adenopathy, no JVD, supple, symmetrical, trachea midline and thyroid not enlarged, symmetric, no tenderness/mass/nodules Lymph nodes: Cervical,  supraclavicular, and axillary nodes normal. Resp: clear to auscultation bilaterally Back: symmetric, no curvature. ROM normal. No CVA tenderness. Cardio: regular rate and rhythm, S1, S2 normal, no murmur, click, rub or gallop GI: soft, non-tender; bowel sounds normal; no masses,  no organomegaly Extremities: extremities normal, atraumatic, no cyanosis or edema  ECOG PERFORMANCE STATUS: 1 - Symptomatic but completely ambulatory  Blood pressure 137/63, pulse 63, temperature 98.2 F (36.8 C), temperature source Oral, resp. rate 17, height 5\' 2"  (1.575 m), weight 128 lb 14.4 oz (58.5 kg), SpO2 99 %.  LABORATORY DATA: Lab Results  Component Value Date   WBC 4.3 03/07/2020   HGB 10.1 (L) 03/07/2020   HCT 31.3 (L) 03/07/2020   MCV 117.2 (H) 03/07/2020   PLT 526 (H) 03/07/2020      Chemistry      Component Value Date/Time   NA 142 01/31/2020 1115   NA 146 (H) 01/04/2020 1006   K 4.3 01/31/2020 1115   CL 109 01/31/2020 1115   CO2 26 01/31/2020 1115   BUN 13 01/31/2020 1115   BUN 13 01/04/2020 1006   CREATININE 0.93 01/31/2020 1115      Component Value Date/Time   CALCIUM 9.2 01/31/2020 1115   ALKPHOS 31 (L) 01/31/2020 1115   AST 20 01/31/2020 1115   ALT 11 01/31/2020 1115   BILITOT 0.6 01/31/2020 1115       RADIOGRAPHIC STUDIES: No results found.  ASSESSMENT AND PLAN: This is a very pleasant 74 years old white female recently diagnosed with essential thrombocythemia with positive JAK2 mutation.  The patient also has anemia and leukocytosis secondary to the myeloproliferative disorder. The patient is currently on treatment with hydroxyurea initially at 500 mg increased to 1000 mg but the patient could not tolerate the higher dose well and we switched her back to 500 mg p.o. daily. The patient continues to tolerate her treatment with hydroxyurea fairly well. Repeat CBC today showed stable but low hemoglobin and hematocrit.  Her platelets count are 526,000. I recommended for  the patient to continue with the current dose of hydroxyurea. I will see her back for follow-up visit in 6 weeks for evaluation and repeat blood work. She was advised to call immediately if she has any concerning symptoms in the interval. The patient voices understanding of current disease status and treatment options and is in agreement with the current care plan.  All questions were answered. The patient knows to call the clinic with any problems, questions or concerns. We can certainly see the patient much sooner if necessary.  Disclaimer: This note was dictated with voice recognition software. Similar sounding words can inadvertently be transcribed and may not be corrected upon review.

## 2020-03-09 ENCOUNTER — Telehealth: Payer: Self-pay | Admitting: Internal Medicine

## 2020-03-09 NOTE — Telephone Encounter (Signed)
Scheduled per los. Called and spoke with patient. Confirmed appt 

## 2020-04-19 ENCOUNTER — Inpatient Hospital Stay: Payer: Medicare Other | Attending: Physician Assistant | Admitting: Internal Medicine

## 2020-04-19 ENCOUNTER — Encounter: Payer: Self-pay | Admitting: Internal Medicine

## 2020-04-19 ENCOUNTER — Other Ambulatory Visit: Payer: Self-pay

## 2020-04-19 ENCOUNTER — Inpatient Hospital Stay: Payer: Medicare Other

## 2020-04-19 VITALS — BP 142/59 | HR 69 | Temp 97.7°F | Resp 20 | Ht 62.0 in | Wt 128.6 lb

## 2020-04-19 DIAGNOSIS — D473 Essential (hemorrhagic) thrombocythemia: Secondary | ICD-10-CM

## 2020-04-19 DIAGNOSIS — D72829 Elevated white blood cell count, unspecified: Secondary | ICD-10-CM | POA: Insufficient documentation

## 2020-04-19 DIAGNOSIS — E785 Hyperlipidemia, unspecified: Secondary | ICD-10-CM | POA: Insufficient documentation

## 2020-04-19 DIAGNOSIS — E119 Type 2 diabetes mellitus without complications: Secondary | ICD-10-CM | POA: Insufficient documentation

## 2020-04-19 DIAGNOSIS — I2510 Atherosclerotic heart disease of native coronary artery without angina pectoris: Secondary | ICD-10-CM

## 2020-04-19 DIAGNOSIS — D649 Anemia, unspecified: Secondary | ICD-10-CM | POA: Diagnosis not present

## 2020-04-19 DIAGNOSIS — Z86718 Personal history of other venous thrombosis and embolism: Secondary | ICD-10-CM | POA: Diagnosis not present

## 2020-04-19 DIAGNOSIS — Z7901 Long term (current) use of anticoagulants: Secondary | ICD-10-CM | POA: Diagnosis not present

## 2020-04-19 DIAGNOSIS — I252 Old myocardial infarction: Secondary | ICD-10-CM | POA: Diagnosis not present

## 2020-04-19 DIAGNOSIS — Z5111 Encounter for antineoplastic chemotherapy: Secondary | ICD-10-CM

## 2020-04-19 DIAGNOSIS — I10 Essential (primary) hypertension: Secondary | ICD-10-CM | POA: Diagnosis not present

## 2020-04-19 DIAGNOSIS — Z79899 Other long term (current) drug therapy: Secondary | ICD-10-CM | POA: Diagnosis not present

## 2020-04-19 LAB — CBC WITH DIFFERENTIAL (CANCER CENTER ONLY)
Abs Immature Granulocytes: 0.01 10*3/uL (ref 0.00–0.07)
Basophils Absolute: 0 10*3/uL (ref 0.0–0.1)
Basophils Relative: 0 %
Eosinophils Absolute: 0 10*3/uL (ref 0.0–0.5)
Eosinophils Relative: 1 %
HCT: 32.6 % — ABNORMAL LOW (ref 36.0–46.0)
Hemoglobin: 10.8 g/dL — ABNORMAL LOW (ref 12.0–15.0)
Immature Granulocytes: 0 %
Lymphocytes Relative: 22 %
Lymphs Abs: 0.6 10*3/uL — ABNORMAL LOW (ref 0.7–4.0)
MCH: 38.3 pg — ABNORMAL HIGH (ref 26.0–34.0)
MCHC: 33.1 g/dL (ref 30.0–36.0)
MCV: 115.6 fL — ABNORMAL HIGH (ref 80.0–100.0)
Monocytes Absolute: 0.2 10*3/uL (ref 0.1–1.0)
Monocytes Relative: 6 %
Neutro Abs: 1.8 10*3/uL (ref 1.7–7.7)
Neutrophils Relative %: 71 %
Platelet Count: 382 10*3/uL (ref 150–400)
RBC: 2.82 MIL/uL — ABNORMAL LOW (ref 3.87–5.11)
RDW: 13.6 % (ref 11.5–15.5)
WBC Count: 2.6 10*3/uL — ABNORMAL LOW (ref 4.0–10.5)
nRBC: 0 % (ref 0.0–0.2)

## 2020-04-19 LAB — CMP (CANCER CENTER ONLY)
ALT: 11 U/L (ref 0–44)
AST: 19 U/L (ref 15–41)
Albumin: 4 g/dL (ref 3.5–5.0)
Alkaline Phosphatase: 37 U/L — ABNORMAL LOW (ref 38–126)
Anion gap: 7 (ref 5–15)
BUN: 15 mg/dL (ref 8–23)
CO2: 28 mmol/L (ref 22–32)
Calcium: 9.3 mg/dL (ref 8.9–10.3)
Chloride: 107 mmol/L (ref 98–111)
Creatinine: 0.84 mg/dL (ref 0.44–1.00)
GFR, Est AFR Am: >60 mL/min (ref >60)
GFR, Estimated: >60 mL/min (ref >60)
Glucose, Bld: 110 mg/dL — ABNORMAL HIGH (ref 70–99)
Potassium: 4 mmol/L (ref 3.5–5.1)
Sodium: 142 mmol/L (ref 135–145)
Total Bilirubin: 0.7 mg/dL (ref 0.3–1.2)
Total Protein: 6 g/dL — ABNORMAL LOW (ref 6.5–8.1)

## 2020-04-19 LAB — LACTATE DEHYDROGENASE: LDH: 236 U/L — ABNORMAL HIGH (ref 98–192)

## 2020-04-19 NOTE — Progress Notes (Signed)
[facility] Telephone:([phone]   [phone]  OFFICE PROGRESS NOTE  [name], MD [place] [address]  DIAGNOSIS: Essential thrombocythemia with positive JAK2 mutation V617F diagnosed in October 2020.  PRIOR THERAPY: None  CURRENT THERAPY: Hydroxyurea 500 mg p.o. daily.   INTERVAL HISTORY: [name] [name] 74 y.o. female returns to the clinic today for follow-up visit accompanied by [name].  Patient is feeling fine today with no concerning complaints.  She denied having any fatigue or weakness.  She denied having any bleeding issues.  She has no chest pain, shortness of breath, cough or hemoptysis.  She has no nausea, vomiting, diarrhea or constipation.  She continues to tolerate her treatment with hydroxyurea fairly well.  The patient is here today for evaluation and repeat blood work.   MEDICAL HISTORY: Past Medical History:  Diagnosis Date  . Anemia   . Coronary artery disease    s/p stent to LAD and LCx  . DM II (diabetes mellitus, type II), controlled ([facility])   . DVT (deep venous thrombosis) ([facility])   . Elevated LFTs   . Hyperlipidemia   . Hypertension   . Myocardial infarction ([facility])    7 yrs ago    ALLERGIES:  is allergic to ampicillin, avelox [moxifloxacin hcl in nacl], bactrim [sulfamethoxazole-trimethoprim], ibuprofen, penicillins, codeine, and latex.  MEDICATIONS:  Current Outpatient Medications  Medication Sig Dispense Refill  . fenofibrate micronized (LOFIBRA) 200 MG capsule TAKE ONE CAPSULE BY MOUTH ONCE DAILY BEFORE BREAKFAST 90 capsule 2  . ferrous sulfate 325 (65 FE) MG EC tablet Take 325 mg by mouth daily.    . Fish Oil-Cholecalciferol (FISH OIL + D3) 1000-1000 MG-UNIT CAPS Take 1 tablet by mouth 3 (three) times daily.    . hydroxyurea (HYDREA) 500 MG capsule Take 1 capsule (500 mg total) by mouth daily. May take with food to minimize GI side effects. 30 capsule 3  . metFORMIN (GLUCOPHAGE) 500 MG  tablet Take 500 mg by mouth 2 (two) times daily with a meal.      . nitroGLYCERIN (NITROSTAT) 0.4 MG SL tablet Place 1 tablet (0.4 mg total) under the tongue every 5 (five) minutes as needed. For chest pain. 25 tablet 0  . prochlorperazine (COMPAZINE) 10 MG tablet Take 1 tablet (10 mg total) by mouth every 6 (six) hours as needed for nausea or vomiting. 30 tablet 2  . rivaroxaban (XARELTO) 20 MG TABS tablet Take 20 mg by mouth daily with supper.    . rosuvastatin (CRESTOR) 20 MG tablet Take 10 mg by mouth daily.    . metoprolol tartrate (LOPRESSOR) 25 MG tablet Take 1 tablet (25 mg total) by mouth 2 (two) times daily. 180 tablet 3   No current facility-administered medications for this visit.    SURGICAL HISTORY:  Past Surgical History:  Procedure Laterality Date  . CATARACT EXTRACTION    . CESAREAN SECTION  1983  . CORONARY ANGIOPLASTY WITH STENT PLACEMENT  04/26/2003   NORMAL. EF 65%  . I & D EXTREMITY  06/22/2012   Procedure: IRRIGATION AND DEBRIDEMENT EXTREMITY;  Surgeon: Tennis Must, MD;  Location: [facility];  Service: Orthopedics;  Laterality: Left;  . I & D EXTREMITY  06/29/2012   Procedure: IRRIGATION AND DEBRIDEMENT EXTREMITY;  Surgeon: Tennis Must, MD;  Location: [facility];  Service: Orthopedics;  Laterality: Left;  . OPEN REDUCTION INTERNAL FIXATION (ORIF) DISTAL RADIAL FRACTURE Right 02/04/2018   Procedure: OPEN REDUCTION  INTERNAL FIXATION (ORIF) RIGHT DISTAL RADIAL FRACTURE;  Surgeon: Leanora Cover, MD;  Location: [facility];  Service: Orthopedics;  Laterality: Right;    REVIEW OF SYSTEMS:  A comprehensive review of systems was negative.   PHYSICAL EXAMINATION: General appearance: alert, cooperative and no distress Head: Normocephalic, without obvious abnormality, atraumatic Neck: no adenopathy, no JVD, supple, symmetrical, trachea midline and thyroid not enlarged, symmetric, no tenderness/mass/nodules Lymph nodes: Cervical, supraclavicular, and axillary nodes  normal. Resp: clear to auscultation bilaterally Back: symmetric, no curvature. ROM normal. No CVA tenderness. Cardio: regular rate and rhythm, S1, S2 normal, no murmur, click, rub or gallop GI: soft, non-tender; bowel sounds normal; no masses,  no organomegaly Extremities: extremities normal, atraumatic, no cyanosis or edema  ECOG PERFORMANCE STATUS: 1 - Symptomatic but completely ambulatory  Blood pressure (!) 142/59, pulse 69, temperature 97.7 F (36.5 C), temperature source Temporal, resp. rate 20, height 5\' 2"  (1.575 m), weight 128 lb 9.6 oz (58.3 kg), SpO2 100 %.  LABORATORY DATA: Lab Results  Component Value Date   WBC 2.6 (L) 04/19/2020   HGB 10.8 (L) 04/19/2020   HCT 32.6 (L) 04/19/2020   MCV 115.6 (H) 04/19/2020   PLT 382 04/19/2020      Chemistry      Component Value Date/Time   NA 144 03/07/2020 1120   NA 146 (H) 01/04/2020 1006   K 4.4 03/07/2020 1120   CL 109 03/07/2020 1120   CO2 28 03/07/2020 1120   BUN 17 03/07/2020 1120   BUN 13 01/04/2020 1006   CREATININE 0.86 03/07/2020 1120      Component Value Date/Time   CALCIUM 9.3 03/07/2020 1120   ALKPHOS 31 (L) 03/07/2020 1120   AST 19 03/07/2020 1120   ALT 11 03/07/2020 1120   BILITOT 0.6 03/07/2020 1120       RADIOGRAPHIC STUDIES: No results found.  ASSESSMENT AND PLAN: This is a very pleasant 74 years old white female recently diagnosed with essential thrombocythemia with positive JAK2 mutation.  The patient also has anemia and leukocytosis secondary to the myeloproliferative disorder. The patient is currently on treatment with hydroxyurea initially at 500 mg increased to 1000 mg but the patient could not tolerate the higher dose well and we switched her back to 500 mg p.o. daily. She has been tolerating her treatment with hydroxyurea fairly well.   CBC today showed platelets count of 382,000.  The patient has hemoglobin of 10.8 but her white blood count decreased compared to 6 weeks ago. I discussed  the lab results with the patient and her daughter and recommended for her to continue with the current dose of hydroxyurea.  We'll continue to monitor her closely with repeat blood work in 6 weeks. The patient was advised to call immediately if she has any concerning symptoms in the interval especially any fever or chills or bleeding issues. The patient voices understanding of current disease status and treatment options and is in agreement with the current care plan.  All questions were answered. The patient knows to call the clinic with any problems, questions or concerns. We can certainly see the patient much sooner if necessary.  Disclaimer: This note was dictated with voice recognition software. Similar sounding words can inadvertently be transcribed and may not be corrected upon review.

## 2020-05-04 DIAGNOSIS — Z1212 Encounter for screening for malignant neoplasm of rectum: Secondary | ICD-10-CM | POA: Diagnosis not present

## 2020-05-28 ENCOUNTER — Other Ambulatory Visit: Payer: Self-pay | Admitting: Physician Assistant

## 2020-05-28 ENCOUNTER — Telehealth: Payer: Self-pay | Admitting: *Deleted

## 2020-05-28 DIAGNOSIS — D473 Essential (hemorrhagic) thrombocythemia: Secondary | ICD-10-CM

## 2020-05-28 DIAGNOSIS — Z5111 Encounter for antineoplastic chemotherapy: Secondary | ICD-10-CM

## 2020-05-28 MED ORDER — HYDROXYUREA 500 MG PO CAPS: 500.0000 mg | ORAL_CAPSULE | Freq: Every day | ORAL | 3 refills | Status: DC

## 2020-05-28 NOTE — Telephone Encounter (Signed)
Patient called requesting refill of Hydrea to [facility] on [address].

## 2020-05-31 ENCOUNTER — Encounter: Payer: Self-pay | Admitting: Internal Medicine

## 2020-05-31 ENCOUNTER — Inpatient Hospital Stay: Payer: Medicare Other

## 2020-05-31 ENCOUNTER — Inpatient Hospital Stay: Payer: Medicare Other | Attending: Physician Assistant | Admitting: Internal Medicine

## 2020-05-31 ENCOUNTER — Other Ambulatory Visit: Payer: Self-pay

## 2020-05-31 VITALS — BP 126/52 | HR 61 | Temp 97.8°F | Resp 20 | Ht 62.0 in | Wt 130.7 lb

## 2020-05-31 DIAGNOSIS — Z86718 Personal history of other venous thrombosis and embolism: Secondary | ICD-10-CM | POA: Insufficient documentation

## 2020-05-31 DIAGNOSIS — I2510 Atherosclerotic heart disease of native coronary artery without angina pectoris: Secondary | ICD-10-CM | POA: Diagnosis not present

## 2020-05-31 DIAGNOSIS — E119 Type 2 diabetes mellitus without complications: Secondary | ICD-10-CM | POA: Insufficient documentation

## 2020-05-31 DIAGNOSIS — Z7901 Long term (current) use of anticoagulants: Secondary | ICD-10-CM | POA: Diagnosis not present

## 2020-05-31 DIAGNOSIS — Z79899 Other long term (current) drug therapy: Secondary | ICD-10-CM | POA: Insufficient documentation

## 2020-05-31 DIAGNOSIS — I252 Old myocardial infarction: Secondary | ICD-10-CM | POA: Diagnosis not present

## 2020-05-31 DIAGNOSIS — D473 Essential (hemorrhagic) thrombocythemia: Secondary | ICD-10-CM | POA: Insufficient documentation

## 2020-05-31 DIAGNOSIS — D649 Anemia, unspecified: Secondary | ICD-10-CM | POA: Insufficient documentation

## 2020-05-31 DIAGNOSIS — I10 Essential (primary) hypertension: Secondary | ICD-10-CM | POA: Diagnosis not present

## 2020-05-31 DIAGNOSIS — C946 Myelodysplastic disease, not classified: Secondary | ICD-10-CM | POA: Insufficient documentation

## 2020-05-31 LAB — CMP (CANCER CENTER ONLY)
ALT: 10 U/L (ref 0–44)
AST: 17 U/L (ref 15–41)
Albumin: 4 g/dL (ref 3.5–5.0)
Alkaline Phosphatase: 44 U/L (ref 38–126)
Anion gap: 7 (ref 5–15)
BUN: 13 mg/dL (ref 8–23)
CO2: 27 mmol/L (ref 22–32)
Calcium: 9.7 mg/dL (ref 8.9–10.3)
Chloride: 107 mmol/L (ref 98–111)
Creatinine: 0.83 mg/dL (ref 0.44–1.00)
GFR, Est AFR Am: >60 mL/min
GFR, Estimated: >60 mL/min
Glucose, Bld: 131 mg/dL — ABNORMAL HIGH (ref 70–99)
Potassium: 4.6 mmol/L (ref 3.5–5.1)
Sodium: 141 mmol/L (ref 135–145)
Total Bilirubin: 0.5 mg/dL (ref 0.3–1.2)
Total Protein: 6 g/dL — ABNORMAL LOW (ref 6.5–8.1)

## 2020-05-31 LAB — CBC WITH DIFFERENTIAL (CANCER CENTER ONLY)
Abs Immature Granulocytes: 0.01 10*3/uL (ref 0.00–0.07)
Basophils Absolute: 0 10*3/uL (ref 0.0–0.1)
Basophils Relative: 0 %
Eosinophils Absolute: 0 10*3/uL (ref 0.0–0.5)
Eosinophils Relative: 1 %
HCT: 32.6 % — ABNORMAL LOW (ref 36.0–46.0)
Hemoglobin: 10.8 g/dL — ABNORMAL LOW (ref 12.0–15.0)
Immature Granulocytes: 0 %
Lymphocytes Relative: 21 %
Lymphs Abs: 0.5 10*3/uL — ABNORMAL LOW (ref 0.7–4.0)
MCH: 37.2 pg — ABNORMAL HIGH (ref 26.0–34.0)
MCHC: 33.1 g/dL (ref 30.0–36.0)
MCV: 112.4 fL — ABNORMAL HIGH (ref 80.0–100.0)
Monocytes Absolute: 0.2 10*3/uL (ref 0.1–1.0)
Monocytes Relative: 6 %
Neutro Abs: 1.7 10*3/uL (ref 1.7–7.7)
Neutrophils Relative %: 72 %
Platelet Count: 362 10*3/uL (ref 150–400)
RBC: 2.9 MIL/uL — ABNORMAL LOW (ref 3.87–5.11)
RDW: 13.2 % (ref 11.5–15.5)
WBC Count: 2.4 10*3/uL — ABNORMAL LOW (ref 4.0–10.5)
nRBC: 0 % (ref 0.0–0.2)

## 2020-05-31 LAB — LACTATE DEHYDROGENASE: LDH: 213 U/L — ABNORMAL HIGH (ref 98–192)

## 2020-05-31 NOTE — Progress Notes (Signed)
[facility] Telephone:(336) [phone]   Fax:(336) [phone]  OFFICE PROGRESS NOTE  [name], MD [place] [address]  DIAGNOSIS: Essential thrombocythemia with positive JAK2 mutation V617F diagnosed in October 2020.  PRIOR THERAPY: None  CURRENT THERAPY: Hydroxyurea 500 mg p.o. daily.   INTERVAL HISTORY: [name] [name] 74 y.o. female returns to the clinic today for follow-up visit.  The patient is feeling fine today with no concerning complaints.  She continues to tolerate her treatment with hydroxyurea fairly well.  She came by herself for the first time since her initial evaluation.  [name] was recently diagnosed with COVID-19 after attending high school reunion in [place].  The patient denied having any current chest pain, shortness of breath, cough or hemoptysis.  She denied having any fever or chills.  She has no nausea, vomiting, diarrhea or constipation.  She is here today for evaluation and repeat blood work.     MEDICAL HISTORY: Past Medical History:  Diagnosis Date  . Anemia   . Coronary artery disease    s/p stent to LAD and LCx  . DM II (diabetes mellitus, type II), controlled ([facility])   . DVT (deep venous thrombosis) ([facility])   . Elevated LFTs   . Hyperlipidemia   . Hypertension   . Myocardial infarction ([facility])    7 yrs ago    ALLERGIES:  is allergic to ampicillin, avelox [moxifloxacin hcl in nacl], bactrim [sulfamethoxazole-trimethoprim], ibuprofen, penicillins, codeine, and latex.  MEDICATIONS:  Current Outpatient Medications  Medication Sig Dispense Refill  . fenofibrate micronized (LOFIBRA) 200 MG capsule TAKE ONE CAPSULE BY MOUTH ONCE DAILY BEFORE BREAKFAST 90 capsule 2  . ferrous sulfate 325 (65 FE) MG EC tablet Take 325 mg by mouth daily.    . Fish Oil-Cholecalciferol (FISH OIL + D3) 1000-1000 MG-UNIT CAPS Take 1 tablet by mouth 3 (three) times daily.    . hydroxyurea (HYDREA) 500 MG capsule Take 1 capsule  (500 mg total) by mouth daily. May take with food to minimize GI side effects. 30 capsule 3  . metFORMIN (GLUCOPHAGE) 500 MG tablet Take 500 mg by mouth 2 (two) times daily with a meal.      . metoprolol tartrate (LOPRESSOR) 25 MG tablet Take 1 tablet (25 mg total) by mouth 2 (two) times daily. 180 tablet 3  . nitroGLYCERIN (NITROSTAT) 0.4 MG SL tablet Place 1 tablet (0.4 mg total) under the tongue every 5 (five) minutes as needed. For chest pain. 25 tablet 0  . prochlorperazine (COMPAZINE) 10 MG tablet Take 1 tablet (10 mg total) by mouth every 6 (six) hours as needed for nausea or vomiting. 30 tablet 2  . rivaroxaban (XARELTO) 20 MG TABS tablet Take 20 mg by mouth daily with supper.    . rosuvastatin (CRESTOR) 20 MG tablet Take 10 mg by mouth daily.     No current facility-administered medications for this visit.    SURGICAL HISTORY:  Past Surgical History:  Procedure Laterality Date  . CATARACT EXTRACTION    . CESAREAN SECTION  1983  . CORONARY ANGIOPLASTY WITH STENT PLACEMENT  04/26/2003   NORMAL. EF 65%  . I & D EXTREMITY  06/22/2012   Procedure: IRRIGATION AND DEBRIDEMENT EXTREMITY;  Surgeon: Tennis Must, MD;  Location: [facility];  Service: Orthopedics;  Laterality: Left;  . I & D EXTREMITY  06/29/2012   Procedure: IRRIGATION AND DEBRIDEMENT EXTREMITY;  Surgeon: Tennis Must, MD;  Location: [facility];  Service: Orthopedics;  Laterality: Left;  . OPEN REDUCTION INTERNAL FIXATION (ORIF) DISTAL RADIAL FRACTURE Right 02/04/2018   Procedure: OPEN REDUCTION INTERNAL FIXATION (ORIF) RIGHT DISTAL RADIAL FRACTURE;  Surgeon: Leanora Cover, MD;  Location: [facility];  Service: Orthopedics;  Laterality: Right;    REVIEW OF SYSTEMS:  A comprehensive review of systems was negative except for: Constitutional: positive for fatigue   PHYSICAL EXAMINATION: General appearance: alert, cooperative and no distress Head: Normocephalic, without obvious abnormality, atraumatic Neck: no adenopathy, no  JVD, supple, symmetrical, trachea midline and thyroid not enlarged, symmetric, no tenderness/mass/nodules Lymph nodes: Cervical, supraclavicular, and axillary nodes normal. Resp: clear to auscultation bilaterally Back: symmetric, no curvature. ROM normal. No CVA tenderness. Cardio: regular rate and rhythm, S1, S2 normal, no murmur, click, rub or gallop GI: soft, non-tender; bowel sounds normal; no masses,  no organomegaly Extremities: extremities normal, atraumatic, no cyanosis or edema  ECOG PERFORMANCE STATUS: 1 - Symptomatic but completely ambulatory  Blood pressure (!) 126/52, pulse 61, temperature 97.8 F (36.6 C), temperature source Temporal, resp. rate 20, height 5\' 2"  (1.575 m), weight 130 lb 11.2 oz (59.3 kg), SpO2 100 %.  LABORATORY DATA: Lab Results  Component Value Date   WBC 2.4 (L) 05/31/2020   HGB 10.8 (L) 05/31/2020   HCT 32.6 (L) 05/31/2020   MCV 112.4 (H) 05/31/2020   PLT 362 05/31/2020      Chemistry      Component Value Date/Time   NA 142 04/19/2020 0907   NA 146 (H) 01/04/2020 1006   K 4.0 04/19/2020 0907   CL 107 04/19/2020 0907   CO2 28 04/19/2020 0907   BUN 15 04/19/2020 0907   BUN 13 01/04/2020 1006   CREATININE 0.84 04/19/2020 0907      Component Value Date/Time   CALCIUM 9.3 04/19/2020 0907   ALKPHOS 37 (L) 04/19/2020 0907   AST 19 04/19/2020 0907   ALT 11 04/19/2020 0907   BILITOT 0.7 04/19/2020 0907       RADIOGRAPHIC STUDIES: No results found.  ASSESSMENT AND PLAN: This is a very pleasant 74 years old white female recently diagnosed with essential thrombocythemia with positive JAK2 mutation.  The patient also has anemia and leukocytosis secondary to the myeloproliferative disorder. The patient is currently on treatment with hydroxyurea initially at 500 mg increased to 1000 mg but the patient could not tolerate the higher dose well and we switched her back to 500 mg p.o. daily. The patient continues to tolerate her treatment with  hydroxyurea fairly well. CBC today showed persistent mild leukocytopenia and anemia but the patient has normal platelets count. I recommended for her to continue her current treatment with hydroxyurea with the same dose. I will see her back for follow-up visit in 6 weeks for evaluation and repeat blood work. She was advised to call immediately if she has any concerning symptoms in the interval. The patient was advised to call immediately if she has any concerning symptoms in the interval especially any fever or chills or bleeding issues. The patient voices understanding of current disease status and treatment options and is in agreement with the current care plan.  All questions were answered. The patient knows to call the clinic with any problems, questions or concerns. We can certainly see the patient much sooner if necessary.  Disclaimer: This note was dictated with voice recognition software. Similar sounding words can inadvertently be transcribed and may not be corrected upon review.

## 2020-06-05 ENCOUNTER — Telehealth: Payer: Self-pay | Admitting: Internal Medicine

## 2020-06-05 NOTE — Telephone Encounter (Signed)
Scheduled per los. Called and spoke with patient confirmed appt  

## 2020-06-26 DIAGNOSIS — R432 Parageusia: Secondary | ICD-10-CM | POA: Diagnosis not present

## 2020-06-26 DIAGNOSIS — R439 Unspecified disturbances of smell and taste: Secondary | ICD-10-CM | POA: Diagnosis not present

## 2020-06-26 DIAGNOSIS — R0989 Other specified symptoms and signs involving the circulatory and respiratory systems: Secondary | ICD-10-CM | POA: Diagnosis not present

## 2020-07-10 ENCOUNTER — Inpatient Hospital Stay: Payer: Medicare Other | Attending: Physician Assistant | Admitting: Internal Medicine

## 2020-07-10 ENCOUNTER — Encounter: Payer: Self-pay | Admitting: Internal Medicine

## 2020-07-10 ENCOUNTER — Inpatient Hospital Stay: Payer: Medicare Other

## 2020-07-10 ENCOUNTER — Other Ambulatory Visit: Payer: Self-pay

## 2020-07-10 VITALS — BP 135/54 | HR 58 | Temp 98.1°F | Resp 18 | Ht 62.0 in | Wt 133.7 lb

## 2020-07-10 DIAGNOSIS — Z5111 Encounter for antineoplastic chemotherapy: Secondary | ICD-10-CM

## 2020-07-10 DIAGNOSIS — D473 Essential (hemorrhagic) thrombocythemia: Secondary | ICD-10-CM | POA: Diagnosis not present

## 2020-07-10 DIAGNOSIS — D649 Anemia, unspecified: Secondary | ICD-10-CM | POA: Insufficient documentation

## 2020-07-10 DIAGNOSIS — D72819 Decreased white blood cell count, unspecified: Secondary | ICD-10-CM | POA: Insufficient documentation

## 2020-07-10 DIAGNOSIS — I2510 Atherosclerotic heart disease of native coronary artery without angina pectoris: Secondary | ICD-10-CM | POA: Diagnosis not present

## 2020-07-10 LAB — CMP (CANCER CENTER ONLY)
ALT: 10 U/L (ref 0–44)
AST: 17 U/L (ref 15–41)
Albumin: 3.9 g/dL (ref 3.5–5.0)
Alkaline Phosphatase: 40 U/L (ref 38–126)
Anion gap: 4 — ABNORMAL LOW (ref 5–15)
BUN: 12 mg/dL (ref 8–23)
CO2: 30 mmol/L (ref 22–32)
Calcium: 9.3 mg/dL (ref 8.9–10.3)
Chloride: 107 mmol/L (ref 98–111)
Creatinine: 0.83 mg/dL (ref 0.44–1.00)
GFR, Est AFR Am: >60 mL/min (ref >60)
GFR, Estimated: >60 mL/min (ref >60)
Glucose, Bld: 71 mg/dL (ref 70–99)
Potassium: 4.1 mmol/L (ref 3.5–5.1)
Sodium: 141 mmol/L (ref 135–145)
Total Bilirubin: 0.5 mg/dL (ref 0.3–1.2)
Total Protein: 6.3 g/dL — ABNORMAL LOW (ref 6.5–8.1)

## 2020-07-10 LAB — CBC WITH DIFFERENTIAL (CANCER CENTER ONLY)
Abs Immature Granulocytes: 0.01 10*3/uL (ref 0.00–0.07)
Basophils Absolute: 0 10*3/uL (ref 0.0–0.1)
Basophils Relative: 0 %
Eosinophils Absolute: 0 10*3/uL (ref 0.0–0.5)
Eosinophils Relative: 1 %
HCT: 33.3 % — ABNORMAL LOW (ref 36.0–46.0)
Hemoglobin: 11 g/dL — ABNORMAL LOW (ref 12.0–15.0)
Immature Granulocytes: 0 %
Lymphocytes Relative: 26 %
Lymphs Abs: 0.7 10*3/uL (ref 0.7–4.0)
MCH: 37 pg — ABNORMAL HIGH (ref 26.0–34.0)
MCHC: 33 g/dL (ref 30.0–36.0)
MCV: 112.1 fL — ABNORMAL HIGH (ref 80.0–100.0)
Monocytes Absolute: 0.2 10*3/uL (ref 0.1–1.0)
Monocytes Relative: 6 %
Neutro Abs: 1.8 10*3/uL (ref 1.7–7.7)
Neutrophils Relative %: 67 %
Platelet Count: 349 10*3/uL (ref 150–400)
RBC: 2.97 MIL/uL — ABNORMAL LOW (ref 3.87–5.11)
RDW: 13.1 % (ref 11.5–15.5)
WBC Count: 2.7 10*3/uL — ABNORMAL LOW (ref 4.0–10.5)
nRBC: 0 % (ref 0.0–0.2)

## 2020-07-10 LAB — LACTATE DEHYDROGENASE: LDH: 221 U/L — ABNORMAL HIGH (ref 98–192)

## 2020-07-10 NOTE — Progress Notes (Signed)
[facility] [phone]   [phone]  OFFICE PROGRESS NOTE  [name], MD [place] [address]  DIAGNOSIS: Essential thrombocythemia with positive JAK2 mutation V617F diagnosed in October 2020.  PRIOR THERAPY: None  CURRENT THERAPY: Hydroxyurea 500 mg p.o. daily.   INTERVAL HISTORY: [name] [name] 74 y.o. female returns to the clinic today for follow-up visit accompanied by her daughter.  The patient is feeling fine today with no concerning complaints.  She denied having any chest pain, shortness of breath, cough or hemoptysis.  She denied having any fatigue or weakness.  She denied having any nausea, vomiting, diarrhea or constipation.  She has no bleeding, bruises or ecchymosis.  She continues to tolerate her treatment with hydroxyurea fairly well.  She is here today for evaluation and repeat blood work.   MEDICAL HISTORY: Past Medical History:  Diagnosis Date   Anemia    Coronary artery disease    s/p stent to LAD and LCx   DM II (diabetes mellitus, type II), controlled (HCC)    DVT (deep venous thrombosis) (HCC)    Elevated LFTs    Hyperlipidemia    Hypertension    Myocardial infarction ([facility])    7 yrs ago    ALLERGIES:  is allergic to ampicillin, avelox [moxifloxacin hcl in nacl], bactrim [sulfamethoxazole-trimethoprim], ibuprofen, penicillins, codeine, and latex.  MEDICATIONS:  Current Outpatient Medications  Medication Sig Dispense Refill   fenofibrate micronized (LOFIBRA) 200 MG capsule TAKE ONE CAPSULE BY MOUTH ONCE DAILY BEFORE BREAKFAST 90 capsule 2   ferrous sulfate 325 (65 FE) MG EC tablet Take 325 mg by mouth daily.     Fish Oil-Cholecalciferol (FISH OIL + D3) 1000-1000 MG-UNIT CAPS Take 1 tablet by mouth 3 (three) times daily.     hydroxyurea (HYDREA) 500 MG capsule Take 1 capsule (500 mg total) by mouth daily. May take with food to minimize GI side effects. 30 capsule 3   metFORMIN  (GLUCOPHAGE) 500 MG tablet Take 500 mg by mouth 2 (two) times daily with a meal.       metoprolol tartrate (LOPRESSOR) 25 MG tablet Take 1 tablet (25 mg total) by mouth 2 (two) times daily. 180 tablet 3   nitroGLYCERIN (NITROSTAT) 0.4 MG SL tablet Place 1 tablet (0.4 mg total) under the tongue every 5 (five) minutes as needed. For chest pain. 25 tablet 0   prochlorperazine (COMPAZINE) 10 MG tablet Take 1 tablet (10 mg total) by mouth every 6 (six) hours as needed for nausea or vomiting. 30 tablet 2   rivaroxaban (XARELTO) 20 MG TABS tablet Take 20 mg by mouth daily with supper.     rosuvastatin (CRESTOR) 20 MG tablet Take 10 mg by mouth daily.     No current facility-administered medications for this visit.    SURGICAL HISTORY:  Past Surgical History:  Procedure Laterality Date   CATARACT EXTRACTION     CESAREAN SECTION  1983   CORONARY ANGIOPLASTY WITH STENT PLACEMENT  04/26/2003   NORMAL. EF 65%   I & D EXTREMITY  06/22/2012   Procedure: IRRIGATION AND DEBRIDEMENT EXTREMITY;  Surgeon: Tennis Must, MD;  Location: [facility];  Service: Orthopedics;  Laterality: Left;   I & D EXTREMITY  06/29/2012   Procedure: IRRIGATION AND DEBRIDEMENT EXTREMITY;  Surgeon: Tennis Must, MD;  Location: [facility];  Service: Orthopedics;  Laterality: Left;   OPEN REDUCTION INTERNAL FIXATION (ORIF) DISTAL RADIAL FRACTURE Right 02/04/2018   Procedure:  OPEN REDUCTION INTERNAL FIXATION (ORIF) RIGHT DISTAL RADIAL FRACTURE;  Surgeon: Leanora Cover, MD;  Location: [facility];  Service: Orthopedics;  Laterality: Right;    REVIEW OF SYSTEMS:  A comprehensive review of systems was negative except for: Constitutional: positive for fatigue   PHYSICAL EXAMINATION: General appearance: alert, cooperative and no distress Head: Normocephalic, without obvious abnormality, atraumatic Neck: no adenopathy, no JVD, supple, symmetrical, trachea midline and thyroid not enlarged, symmetric, no  tenderness/mass/nodules Lymph nodes: Cervical, supraclavicular, and axillary nodes normal. Resp: clear to auscultation bilaterally Back: symmetric, no curvature. ROM normal. No CVA tenderness. Cardio: regular rate and rhythm, S1, S2 normal, no murmur, click, rub or gallop GI: soft, non-tender; bowel sounds normal; no masses,  no organomegaly Extremities: extremities normal, atraumatic, no cyanosis or edema  ECOG PERFORMANCE STATUS: 1 - Symptomatic but completely ambulatory  Blood pressure (!) 135/54, pulse (!) 58, temperature 98.1 F (36.7 C), temperature source Tympanic, resp. rate 18, height 5\' 2"  (1.575 m), weight 133 lb 11.2 oz (60.6 kg), SpO2 100 %.  LABORATORY DATA: Lab Results  Component Value Date   WBC 2.4 (L) 05/31/2020   HGB 10.8 (L) 05/31/2020   HCT 32.6 (L) 05/31/2020   MCV 112.4 (H) 05/31/2020   PLT 362 05/31/2020      Chemistry      Component Value Date/Time   NA 141 05/31/2020 1135   NA 146 (H) 01/04/2020 1006   K 4.6 05/31/2020 1135   CL 107 05/31/2020 1135   CO2 27 05/31/2020 1135   BUN 13 05/31/2020 1135   BUN 13 01/04/2020 1006   CREATININE 0.83 05/31/2020 1135      Component Value Date/Time   CALCIUM 9.7 05/31/2020 1135   ALKPHOS 44 05/31/2020 1135   AST 17 05/31/2020 1135   ALT 10 05/31/2020 1135   BILITOT 0.5 05/31/2020 1135       RADIOGRAPHIC STUDIES: No results found.  ASSESSMENT AND PLAN: This is a very pleasant 74 years old white female recently diagnosed with essential thrombocythemia with positive JAK2 mutation.  The patient also has anemia and leukocytosis secondary to the myeloproliferative disorder. The patient is currently on treatment with hydroxyurea initially at 500 mg increased to 1000 mg but the patient could not tolerate the higher dose well and we switched her back to 500 mg p.o. daily. The patient has been tolerating this treatment well with no concerning adverse effects. Repeat CBC today showed a stable leukocytopenia and  anemia but her platelets count are normal. I recommended for her to continue her current treatment with hydroxyurea with the same dose. She will come back for follow-up visit in 6 weeks for evaluation and repeat blood work. She was advised to call immediately if she has any concerning symptoms in the interval. The patient was advised to call immediately if she has any concerning symptoms in the interval especially any fever or chills or bleeding issues. The patient voices understanding of current disease status and treatment options and is in agreement with the current care plan.  All questions were answered. The patient knows to call the clinic with any problems, questions or concerns. We can certainly see the patient much sooner if necessary.  Disclaimer: This note was dictated with voice recognition software. Similar sounding words can inadvertently be transcribed and may not be corrected upon review.

## 2020-08-07 ENCOUNTER — Telehealth: Payer: Self-pay | Admitting: Internal Medicine

## 2020-08-07 DIAGNOSIS — R198 Other specified symptoms and signs involving the digestive system and abdomen: Secondary | ICD-10-CM | POA: Diagnosis not present

## 2020-08-07 DIAGNOSIS — K117 Disturbances of salivary secretion: Secondary | ICD-10-CM | POA: Diagnosis not present

## 2020-08-07 DIAGNOSIS — K146 Glossodynia: Secondary | ICD-10-CM | POA: Diagnosis not present

## 2020-08-07 DIAGNOSIS — R432 Parageusia: Secondary | ICD-10-CM | POA: Diagnosis not present

## 2020-08-07 NOTE — Telephone Encounter (Signed)
Called patient. Not able to leave msg. Called patients daughter and left msg. R/s 10/19 appt per provider PAL.

## 2020-08-18 DIAGNOSIS — Z23 Encounter for immunization: Secondary | ICD-10-CM | POA: Diagnosis not present

## 2020-08-21 ENCOUNTER — Other Ambulatory Visit: Payer: Medicare Other

## 2020-08-21 ENCOUNTER — Ambulatory Visit: Payer: Medicare Other | Admitting: Internal Medicine

## 2020-08-28 ENCOUNTER — Encounter: Payer: Self-pay | Admitting: Internal Medicine

## 2020-08-28 ENCOUNTER — Inpatient Hospital Stay: Payer: Medicare Other | Attending: Physician Assistant

## 2020-08-28 ENCOUNTER — Other Ambulatory Visit: Payer: Self-pay

## 2020-08-28 ENCOUNTER — Inpatient Hospital Stay (HOSPITAL_BASED_OUTPATIENT_CLINIC_OR_DEPARTMENT_OTHER): Payer: Medicare Other | Admitting: Internal Medicine

## 2020-08-28 VITALS — BP 124/50 | HR 68 | Temp 98.5°F | Resp 18 | Ht 62.0 in | Wt 132.4 lb

## 2020-08-28 DIAGNOSIS — I2510 Atherosclerotic heart disease of native coronary artery without angina pectoris: Secondary | ICD-10-CM

## 2020-08-28 DIAGNOSIS — D473 Essential (hemorrhagic) thrombocythemia: Secondary | ICD-10-CM

## 2020-08-28 DIAGNOSIS — C946 Myelodysplastic disease, not classified: Secondary | ICD-10-CM | POA: Insufficient documentation

## 2020-08-28 DIAGNOSIS — Z5111 Encounter for antineoplastic chemotherapy: Secondary | ICD-10-CM | POA: Diagnosis not present

## 2020-08-28 DIAGNOSIS — D649 Anemia, unspecified: Secondary | ICD-10-CM | POA: Insufficient documentation

## 2020-08-28 LAB — CBC WITH DIFFERENTIAL (CANCER CENTER ONLY)
Abs Immature Granulocytes: 0.01 10*3/uL (ref 0.00–0.07)
Basophils Absolute: 0 10*3/uL (ref 0.0–0.1)
Basophils Relative: 0 %
Eosinophils Absolute: 0 10*3/uL (ref 0.0–0.5)
Eosinophils Relative: 1 %
HCT: 31.8 % — ABNORMAL LOW (ref 36.0–46.0)
Hemoglobin: 10.4 g/dL — ABNORMAL LOW (ref 12.0–15.0)
Immature Granulocytes: 0 %
Lymphocytes Relative: 16 %
Lymphs Abs: 0.5 10*3/uL — ABNORMAL LOW (ref 0.7–4.0)
MCH: 35.9 pg — ABNORMAL HIGH (ref 26.0–34.0)
MCHC: 32.7 g/dL (ref 30.0–36.0)
MCV: 109.7 fL — ABNORMAL HIGH (ref 80.0–100.0)
Monocytes Absolute: 0.2 10*3/uL (ref 0.1–1.0)
Monocytes Relative: 7 %
Neutro Abs: 2.3 10*3/uL (ref 1.7–7.7)
Neutrophils Relative %: 76 %
Platelet Count: 402 10*3/uL — ABNORMAL HIGH (ref 150–400)
RBC: 2.9 MIL/uL — ABNORMAL LOW (ref 3.87–5.11)
RDW: 13.4 % (ref 11.5–15.5)
WBC Count: 3 10*3/uL — ABNORMAL LOW (ref 4.0–10.5)
nRBC: 0 % (ref 0.0–0.2)

## 2020-08-28 NOTE — Progress Notes (Signed)
[facility] Telephone:([phone]   Fax:(336) [phone]  OFFICE PROGRESS NOTE  [name], MD [place] [address]  DIAGNOSIS: Essential thrombocythemia with positive JAK2 mutation V617F diagnosed in October 2020.  PRIOR THERAPY: None  CURRENT THERAPY: Hydroxyurea 500 mg p.o. daily.   INTERVAL HISTORY: [name] [name] 74 y.o. female returns to the clinic today for follow-up visit accompanied by [name].  The patient is feeling fine today with no concerning complaints.  She denied having any fatigue or weakness.  She denied having any bleeding, bruises or ecchymosis.  She has no nausea, vomiting, diarrhea or constipation.  She has no headache or visual changes.  She has no chest pain, shortness of breath, cough or hemoptysis.  She continues to tolerate her treatment with hydroxyurea fairly well.  The patient is here today for evaluation and repeat blood work.   MEDICAL HISTORY: Past Medical History:  Diagnosis Date  . Anemia   . Coronary artery disease    s/p stent to LAD and LCx  . DM II (diabetes mellitus, type II), controlled ([facility])   . DVT (deep venous thrombosis) ([facility])   . Elevated LFTs   . Hyperlipidemia   . Hypertension   . Myocardial infarction ([facility])    7 yrs ago    ALLERGIES:  is allergic to ampicillin, avelox [moxifloxacin hcl in nacl], bactrim [sulfamethoxazole-trimethoprim], ibuprofen, penicillins, codeine, and latex.  MEDICATIONS:  Current Outpatient Medications  Medication Sig Dispense Refill  . fenofibrate micronized (LOFIBRA) 200 MG capsule TAKE ONE CAPSULE BY MOUTH ONCE DAILY BEFORE BREAKFAST 90 capsule 2  . ferrous sulfate 325 (65 FE) MG EC tablet Take 325 mg by mouth daily.    . Fish Oil-Cholecalciferol (FISH OIL + D3) 1000-1000 MG-UNIT CAPS Take 1 tablet by mouth 3 (three) times daily.    . hydroxyurea (HYDREA) 500 MG capsule Take 1 capsule (500 mg total) by mouth daily. May take with food to minimize GI  side effects. 30 capsule 3  . metFORMIN (GLUCOPHAGE) 500 MG tablet Take 500 mg by mouth 2 (two) times daily with a meal.      . metoprolol tartrate (LOPRESSOR) 25 MG tablet Take 1 tablet (25 mg total) by mouth 2 (two) times daily. 180 tablet 3  . nitroGLYCERIN (NITROSTAT) 0.4 MG SL tablet Place 1 tablet (0.4 mg total) under the tongue every 5 (five) minutes as needed. For chest pain. 25 tablet 0  . prochlorperazine (COMPAZINE) 10 MG tablet Take 1 tablet (10 mg total) by mouth every 6 (six) hours as needed for nausea or vomiting. 30 tablet 2  . rivaroxaban (XARELTO) 20 MG TABS tablet Take 20 mg by mouth daily with supper.    . rosuvastatin (CRESTOR) 20 MG tablet Take 10 mg by mouth daily.     No current facility-administered medications for this visit.    SURGICAL HISTORY:  Past Surgical History:  Procedure Laterality Date  . CATARACT EXTRACTION    . CESAREAN SECTION  1983  . CORONARY ANGIOPLASTY WITH STENT PLACEMENT  04/26/2003   NORMAL. EF 65%  . I & D EXTREMITY  06/22/2012   Procedure: IRRIGATION AND DEBRIDEMENT EXTREMITY;  Surgeon: Tennis Must, MD;  Location: [facility];  Service: Orthopedics;  Laterality: Left;  . I & D EXTREMITY  06/29/2012   Procedure: IRRIGATION AND DEBRIDEMENT EXTREMITY;  Surgeon: Tennis Must, MD;  Location: [facility];  Service: Orthopedics;  Laterality: Left;  . OPEN REDUCTION INTERNAL FIXATION (  ORIF) DISTAL RADIAL FRACTURE Right 02/04/2018   Procedure: OPEN REDUCTION INTERNAL FIXATION (ORIF) RIGHT DISTAL RADIAL FRACTURE;  Surgeon: Leanora Cover, MD;  Location: [facility];  Service: Orthopedics;  Laterality: Right;    REVIEW OF SYSTEMS:  A comprehensive review of systems was negative.   PHYSICAL EXAMINATION: General appearance: alert, cooperative and no distress Head: Normocephalic, without obvious abnormality, atraumatic Neck: no adenopathy, no JVD, supple, symmetrical, trachea midline and thyroid not enlarged, symmetric, no tenderness/mass/nodules Lymph  nodes: Cervical, supraclavicular, and axillary nodes normal. Resp: clear to auscultation bilaterally Back: symmetric, no curvature. ROM normal. No CVA tenderness. Cardio: regular rate and rhythm, S1, S2 normal, no murmur, click, rub or gallop GI: soft, non-tender; bowel sounds normal; no masses,  no organomegaly Extremities: extremities normal, atraumatic, no cyanosis or edema  ECOG PERFORMANCE STATUS: 1 - Symptomatic but completely ambulatory  Blood pressure (!) 124/50, pulse 68, temperature 98.5 F (36.9 C), temperature source Tympanic, resp. rate 18, height 5\' 2"  (1.575 m), weight 132 lb 6.4 oz (60.1 kg), SpO2 99 %.  LABORATORY DATA: Lab Results  Component Value Date   WBC 3.0 (L) 08/28/2020   HGB 10.4 (L) 08/28/2020   HCT 31.8 (L) 08/28/2020   MCV 109.7 (H) 08/28/2020   PLT 402 (H) 08/28/2020      Chemistry      Component Value Date/Time   NA 141 07/10/2020 1152   NA 146 (H) 01/04/2020 1006   K 4.1 07/10/2020 1152   CL 107 07/10/2020 1152   CO2 30 07/10/2020 1152   BUN 12 07/10/2020 1152   BUN 13 01/04/2020 1006   CREATININE 0.83 07/10/2020 1152      Component Value Date/Time   CALCIUM 9.3 07/10/2020 1152   ALKPHOS 40 07/10/2020 1152   AST 17 07/10/2020 1152   ALT 10 07/10/2020 1152   BILITOT 0.5 07/10/2020 1152       RADIOGRAPHIC STUDIES: No results found.  ASSESSMENT AND PLAN: This is a very pleasant 74 years old white female recently diagnosed with essential thrombocythemia with positive JAK2 mutation.  The patient also has anemia and leukocytosis secondary to the myeloproliferative disorder. The patient is currently on treatment with hydroxyurea initially at 500 mg increased to 1000 mg but the patient could not tolerate the higher dose well and we switched her back to 500 mg p.o. daily. The patient continues to tolerate her once daily dose of the hydroxyurea very well. CBC today showed improvement of her total white blood count in addition to stable  hemoglobin and hematocrit and slightly elevated platelets count. I recommended for her to continue her current treatment with hydroxyurea 500 mg p.o. daily. I will see her back for follow-up visit in 6 weeks for evaluation and repeat blood work. She was advised to call immediately if she has any other concerning symptoms in the interval. The patient was advised to call immediately if she has any concerning symptoms in the interval especially any fever or chills or bleeding issues. The patient voices understanding of current disease status and treatment options and is in agreement with the current care plan.  All questions were answered. The patient knows to call the clinic with any problems, questions or concerns. We can certainly see the patient much sooner if necessary.  Disclaimer: This note was dictated with voice recognition software. Similar sounding words can inadvertently be transcribed and may not be corrected upon review.

## 2020-08-29 ENCOUNTER — Telehealth: Payer: Self-pay | Admitting: Internal Medicine

## 2020-08-29 NOTE — Telephone Encounter (Signed)
Scheduled per los. Called and spoke with patients daughter. Confirmed appt  °

## 2020-09-17 ENCOUNTER — Other Ambulatory Visit: Payer: Self-pay

## 2020-09-17 ENCOUNTER — Ambulatory Visit (HOSPITAL_COMMUNITY)
Admission: RE | Admit: 2020-09-17 | Discharge: 2020-09-17 | Disposition: A | Discharge status: Home / Self Care | Payer: Medicare Other | Source: Ambulatory Visit | Attending: Internal Medicine | Admitting: Internal Medicine

## 2020-09-17 ENCOUNTER — Other Ambulatory Visit (HOSPITAL_COMMUNITY): Payer: Self-pay | Admitting: Internal Medicine

## 2020-09-17 DIAGNOSIS — M7989 Other specified soft tissue disorders: Secondary | ICD-10-CM

## 2020-09-17 DIAGNOSIS — M79662 Pain in left lower leg: Secondary | ICD-10-CM

## 2020-09-17 NOTE — Progress Notes (Signed)
Lower extremity venous has been completed.   Preliminary results in CV Proc.   [name] [name] 09/17/2020 1:35 PM

## 2020-09-26 DIAGNOSIS — L03116 Cellulitis of left lower limb: Secondary | ICD-10-CM | POA: Diagnosis not present

## 2020-09-26 DIAGNOSIS — M25472 Effusion, left ankle: Secondary | ICD-10-CM | POA: Diagnosis not present

## 2020-09-26 DIAGNOSIS — Z7901 Long term (current) use of anticoagulants: Secondary | ICD-10-CM | POA: Diagnosis not present

## 2020-09-26 DIAGNOSIS — I119 Hypertensive heart disease without heart failure: Secondary | ICD-10-CM | POA: Diagnosis not present

## 2020-10-01 ENCOUNTER — Other Ambulatory Visit: Payer: Self-pay | Admitting: Physician Assistant

## 2020-10-01 DIAGNOSIS — Z5111 Encounter for antineoplastic chemotherapy: Secondary | ICD-10-CM

## 2020-10-01 DIAGNOSIS — D473 Essential (hemorrhagic) thrombocythemia: Secondary | ICD-10-CM

## 2020-10-04 ENCOUNTER — Encounter: Payer: Self-pay | Admitting: Orthopedic Surgery

## 2020-10-04 ENCOUNTER — Ambulatory Visit (INDEPENDENT_AMBULATORY_CARE_PROVIDER_SITE_OTHER): Payer: Medicare Other | Admitting: Orthopedic Surgery

## 2020-10-04 VITALS — Ht 62.0 in | Wt 132.0 lb

## 2020-10-04 DIAGNOSIS — I872 Venous insufficiency (chronic) (peripheral): Secondary | ICD-10-CM

## 2020-10-04 DIAGNOSIS — I2510 Atherosclerotic heart disease of native coronary artery without angina pectoris: Secondary | ICD-10-CM | POA: Diagnosis not present

## 2020-10-04 MED ORDER — PENTOXIFYLLINE ER 400 MG PO TBCR: 400.0000 mg | EXTENDED_RELEASE_TABLET | Freq: Three times a day (TID) | ORAL | 3 refills | Status: DC

## 2020-10-08 ENCOUNTER — Encounter: Payer: Self-pay | Admitting: Orthopedic Surgery

## 2020-10-08 ENCOUNTER — Ambulatory Visit (INDEPENDENT_AMBULATORY_CARE_PROVIDER_SITE_OTHER): Payer: Medicare Other | Admitting: Orthopedic Surgery

## 2020-10-08 DIAGNOSIS — I2510 Atherosclerotic heart disease of native coronary artery without angina pectoris: Secondary | ICD-10-CM

## 2020-10-08 DIAGNOSIS — I872 Venous insufficiency (chronic) (peripheral): Secondary | ICD-10-CM | POA: Diagnosis not present

## 2020-10-08 NOTE — Progress Notes (Signed)
Office Visit Note   Patient: [name] [name]           Date of Birth: 12-Jul-1946           MRN: [id number] Visit Date: 10/08/2020              Requested by: [name], [place] [place],  [place] [address] PCP: [name], MD  Chief Complaint  Patient presents with  . Left Leg - Follow-up      HPI: Patient presents in follow-up for compression wrap for massive venous stasis swelling of the left lower extremity patient initially presented with significant dermatitis involving half her leg.  Assessment & Plan: Visit Diagnoses:  1. Venous stasis dermatitis of both lower extremities     Plan: Recommended patient obtain a pair of size medium knee-high compression stockings wear these around-the-clock ensure there are no wrinkles in the stocking.  Follow-Up Instructions: Return in about 4 weeks (around 11/05/2020).   Ortho Exam  Patient is alert, oriented, no adenopathy, well-dressed, normal affect, normal respiratory effort. Examination patient has excellent resolution of the massive swelling of the left leg after only 1 week of Dynaflex compression wrap.  The skin is wrinkling well the dermatitis has resolved significantly her calf measures 35 cm in circumference.  No clinical signs of infection.  Imaging: No results found. No images are attached to the encounter.  Labs: Lab Results  Component Value Date   REPTSTATUS 07/01/2012 FINAL 06/29/2012   REPTSTATUS 07/04/2012 FINAL 06/29/2012   GRAMSTAIN  06/29/2012    FEW WBC PRESENT,BOTH PMN AND MONONUCLEAR NO SQUAMOUS EPITHELIAL CELLS SEEN NO ORGANISMS SEEN   GRAMSTAIN  06/29/2012    FEW WBC PRESENT,BOTH PMN AND MONONUCLEAR NO SQUAMOUS EPITHELIAL CELLS SEEN NO ORGANISMS SEEN   CULT NO GROWTH 2 DAYS 06/29/2012   CULT NO ANAEROBES ISOLATED 06/29/2012     Lab Results  Component Value Date   ALBUMIN 3.9 07/10/2020   ALBUMIN 4.0 05/31/2020   ALBUMIN 4.0 04/19/2020    No results found for: MG No results  found for: VD25OH  No results found for: PREALBUMIN CBC EXTENDED Latest Ref Rng & Units 08/28/2020 07/10/2020 05/31/2020  WBC 4.0 - 10.5 K/uL 3.0(L) 2.7(L) 2.4(L)  RBC 3.87 - 5.11 MIL/uL 2.90(L) 2.97(L) 2.90(L)  HGB 12.0 - 15.0 g/dL 10.4(L) 11.0(L) 10.8(L)  HCT 36 - 46 % 31.8(L) 33.3(L) 32.6(L)  PLT 150 - 400 K/uL 402(H) 349 362  NEUTROABS 1.7 - 7.7 K/uL 2.3 1.8 1.7  LYMPHSABS 0.7 - 4.0 K/uL 0.5(L) 0.7 0.5(L)     There is no height or weight on file to calculate BMI.  Orders:  No orders of the defined types were placed in this encounter.  No orders of the defined types were placed in this encounter.    Procedures: No procedures performed  Clinical Data: No additional findings.  ROS:  All other systems negative, except as noted in the HPI. Review of Systems  Objective: Vital Signs: There were no vitals taken for this visit.  Specialty Comments:  No specialty comments available.  PMFS History: Patient Active Problem List   Diagnosis Date Noted  . Essential thrombocythemia ([facility]) 10/05/2019  . Nausea without vomiting 10/05/2019  . Encounter for antineoplastic chemotherapy 09/21/2019  . Goals of care, counseling/discussion 09/21/2019  . Anemia 09/07/2019  . Leukocytosis 09/07/2019  . Mixed hyperlipidemia 09/01/2016  . DVT (deep venous thrombosis) ([facility]) 09/18/2014  . Drug-induced hypersensitivity reaction 06/28/2012  . Fever 06/28/2012  . Lactic acidosis  06/28/2012  . Hand abscess 06/28/2012  . Hyponatremia 06/28/2012  . Hypertension 06/28/2012  . DM2 (diabetes mellitus, type 2) ([facility]) 06/28/2012  . Petechiae 06/28/2012  . CAD (coronary artery disease) 04/28/2011   Past Medical History:  Diagnosis Date  . Anemia   . Coronary artery disease    s/p stent to LAD and LCx  . DM II (diabetes mellitus, type II), controlled ([facility])   . DVT (deep venous thrombosis) ([facility])   . Elevated LFTs   . Hyperlipidemia   . Hypertension   . Myocardial infarction ([facility])    7 yrs  ago    Family History  Problem Relation Age of Onset  . Heart attack Mother        X2  . Aneurysm Mother        AAA  . Stroke Mother   . Heart disease Mother   . Heart attack Father   . Heart disease Father   . Cirrhosis Father   . Osteoarthritis Sister   . Dementia Sister     Past Surgical History:  Procedure Laterality Date  . CATARACT EXTRACTION    . CESAREAN SECTION  1983  . CORONARY ANGIOPLASTY WITH STENT PLACEMENT  04/26/2003   NORMAL. EF 65%  . I & D EXTREMITY  06/22/2012   Procedure: IRRIGATION AND DEBRIDEMENT EXTREMITY;  Surgeon: Tennis Must, MD;  Location: [facility];  Service: Orthopedics;  Laterality: Left;  . I & D EXTREMITY  06/29/2012   Procedure: IRRIGATION AND DEBRIDEMENT EXTREMITY;  Surgeon: Tennis Must, MD;  Location: [facility];  Service: Orthopedics;  Laterality: Left;  . OPEN REDUCTION INTERNAL FIXATION (ORIF) DISTAL RADIAL FRACTURE Right 02/04/2018   Procedure: OPEN REDUCTION INTERNAL FIXATION (ORIF) RIGHT DISTAL RADIAL FRACTURE;  Surgeon: Leanora Cover, MD;  Location: [facility];  Service: Orthopedics;  Laterality: Right;   Social History   Occupational History  . Not on file  Tobacco Use  . Smoking status: Never Smoker  . Smokeless tobacco: Never Used  Vaping Use  . Vaping Use: Never used  Substance and Sexual Activity  . Alcohol use: Yes    Comment: social   . Drug use: No  . Sexual activity: Not on file

## 2020-10-08 NOTE — Progress Notes (Signed)
Office Visit Note   Patient: [name] [name]           Date of Birth: [birth year]           MRN: [id number] Visit Date: 10/04/2020              Requested by: [name], [place] [employer],   [id number] PCP: [name], MD  Chief Complaint  Patient presents with  . Left Leg - Pain      HPI: Patient is a 74 year old woman who presents for evaluation for left lower extremity swelling for 3 weeks she denies any open wounds or drainage.  She states she has had a history of DVT in the left leg she states her diabetes is under control and most recent ultrasound was negative for DVT.  She states she has tried doxycycline twice a day for 1 week she is currently on clindamycin 900 mg three times a day she has taken this for 14 days.  Assessment & Plan: Visit Diagnoses:  1. Venous stasis dermatitis of both lower extremities     Plan: We will apply a Dynaflex compressive wrap she is currently on Xarelto we will call in a prescription for Trental.  Follow-up on Monday to evaluate for repeat compression wrap versus compression socks.  Follow-Up Instructions: Return in about 1 week (around 10/11/2020).   Ortho Exam  Patient is alert, oriented, no adenopathy, well-dressed, normal affect, normal respiratory effort. Examination patient has brawny edema in both lower extremities with more swelling the left leg than the right leg.  She has a strong dorsalis pedis pulse but no ulcers.  She does have dermatitis worse on the left leg than the right leg.  Imaging: No results found. No images are attached to the encounter.  Labs: Lab Results  Component Value Date   REPTSTATUS 07/01/2012 FINAL 06/29/2012   REPTSTATUS 07/04/2012 FINAL 06/29/2012   GRAMSTAIN  06/29/2012    FEW WBC PRESENT,BOTH PMN AND MONONUCLEAR NO SQUAMOUS EPITHELIAL CELLS SEEN NO ORGANISMS SEEN   GRAMSTAIN  06/29/2012    FEW WBC PRESENT,BOTH PMN AND MONONUCLEAR NO SQUAMOUS EPITHELIAL CELLS SEEN NO  ORGANISMS SEEN   CULT NO GROWTH 2 DAYS 06/29/2012   CULT NO ANAEROBES ISOLATED 06/29/2012     Lab Results  Component Value Date   ALBUMIN 3.9 07/10/2020   ALBUMIN 4.0 05/31/2020   ALBUMIN 4.0 04/19/2020    No results found for: MG No results found for: VD25OH  No results found for: PREALBUMIN CBC EXTENDED Latest Ref Rng & Units 08/28/2020 07/10/2020 05/31/2020  WBC 4.0 - 10.5 K/uL 3.0(L) 2.7(L) 2.4(L)  RBC 3.87 - 5.11 MIL/uL 2.90(L) 2.97(L) 2.90(L)  HGB 12.0 - 15.0 g/dL 10.4(L) 11.0(L) 10.8(L)  HCT 36 - 46 % 31.8(L) 33.3(L) 32.6(L)  PLT 150 - 400 K/uL 402(H) 349 362  NEUTROABS 1.7 - 7.7 K/uL 2.3 1.8 1.7  LYMPHSABS 0.7 - 4.0 K/uL 0.5(L) 0.7 0.5(L)     Body mass index is 24.14 kg/m.  Orders:  No orders of the defined types were placed in this encounter.  Meds ordered this encounter  Medications  . pentoxifylline (TRENTAL) 400 MG CR tablet    Sig: Take 1 tablet (400 mg total) by mouth 3 (three) times daily with meals.    Dispense:  90 tablet    Refill:  3     Procedures: No procedures performed  Clinical Data: No additional findings.  ROS:  All other systems negative, except as noted in the  HPI. Review of Systems  Objective: Vital Signs: Ht 5\' 2"  (1.575 m)   Wt 132 lb (59.9 kg)   BMI 24.14 kg/m   Specialty Comments:  No specialty comments available.  PMFS History: Patient Active Problem List   Diagnosis Date Noted  . Essential thrombocythemia ([place]) 10/05/2019  . Nausea without vomiting 10/05/2019  . Encounter for antineoplastic chemotherapy 09/21/2019  . Goals of care, counseling/discussion 09/21/2019  . Anemia 09/07/2019  . Leukocytosis 09/07/2019  . Mixed hyperlipidemia 09/01/2016  . DVT (deep venous thrombosis) ([place]) 09/18/2014  . Drug-induced hypersensitivity reaction 06/28/2012  . Fever 06/28/2012  . Lactic acidosis 06/28/2012  . Hand abscess 06/28/2012  . Hyponatremia 06/28/2012  . Hypertension 06/28/2012  . DM2 (diabetes mellitus, type  2) ([place]) 06/28/2012  . Petechiae 06/28/2012  . CAD (coronary artery disease) 04/28/2011   Past Medical History:  Diagnosis Date  . Anemia   . Coronary artery disease    s/p stent to LAD and LCx  . DM II (diabetes mellitus, type II), controlled ([place])   . DVT (deep venous thrombosis) ([place])   . Elevated LFTs   . Hyperlipidemia   . Hypertension   . Myocardial infarction ([facility])    7 yrs ago    Family History  Problem Relation Age of Onset  . Heart attack Mother        X2  . Aneurysm Mother        AAA  . Stroke Mother   . Heart disease Mother   . Heart attack Father   . Heart disease Father   . Cirrhosis Father   . Osteoarthritis Sister   . Dementia Sister     Past Surgical History:  Procedure Laterality Date  . CATARACT EXTRACTION    . CESAREAN SECTION  1983  . CORONARY ANGIOPLASTY WITH STENT PLACEMENT  04/26/2003   NORMAL. EF 65%  . I & D EXTREMITY  06/22/2012   Procedure: IRRIGATION AND DEBRIDEMENT EXTREMITY;  Surgeon: Tennis Must, MD;  Location: [facility];  Service: Orthopedics;  Laterality: Left;  . I & D EXTREMITY  06/29/2012   Procedure: IRRIGATION AND DEBRIDEMENT EXTREMITY;  Surgeon: Tennis Must, MD;  Location: [facility];  Service: Orthopedics;  Laterality: Left;  . OPEN REDUCTION INTERNAL FIXATION (ORIF) DISTAL RADIAL FRACTURE Right 02/04/2018   Procedure: OPEN REDUCTION INTERNAL FIXATION (ORIF) RIGHT DISTAL RADIAL FRACTURE;  Surgeon: Leanora Cover, MD;  Location: [facility];  Service: Orthopedics;  Laterality: Right;   Social History   Occupational History  . Not on file  Tobacco Use  . Smoking status: Never Smoker  . Smokeless tobacco: Never Used  Vaping Use  . Vaping Use: Never used  Substance and Sexual Activity  . Alcohol use: Yes    Comment: social   . Drug use: No  . Sexual activity: Not on file

## 2020-10-09 ENCOUNTER — Inpatient Hospital Stay: Payer: Medicare Other

## 2020-10-09 ENCOUNTER — Other Ambulatory Visit: Payer: Self-pay

## 2020-10-09 ENCOUNTER — Inpatient Hospital Stay: Payer: Medicare Other | Attending: Physician Assistant | Admitting: Internal Medicine

## 2020-10-09 ENCOUNTER — Encounter: Payer: Self-pay | Admitting: Internal Medicine

## 2020-10-09 VITALS — BP 123/55 | HR 60 | Temp 98.0°F | Resp 14 | Ht 62.0 in | Wt 128.9 lb

## 2020-10-09 DIAGNOSIS — D649 Anemia, unspecified: Secondary | ICD-10-CM | POA: Insufficient documentation

## 2020-10-09 DIAGNOSIS — Z86718 Personal history of other venous thrombosis and embolism: Secondary | ICD-10-CM | POA: Insufficient documentation

## 2020-10-09 DIAGNOSIS — E119 Type 2 diabetes mellitus without complications: Secondary | ICD-10-CM | POA: Insufficient documentation

## 2020-10-09 DIAGNOSIS — Z5111 Encounter for antineoplastic chemotherapy: Secondary | ICD-10-CM

## 2020-10-09 DIAGNOSIS — Z79899 Other long term (current) drug therapy: Secondary | ICD-10-CM | POA: Insufficient documentation

## 2020-10-09 DIAGNOSIS — I2510 Atherosclerotic heart disease of native coronary artery without angina pectoris: Secondary | ICD-10-CM | POA: Insufficient documentation

## 2020-10-09 DIAGNOSIS — D473 Essential (hemorrhagic) thrombocythemia: Secondary | ICD-10-CM | POA: Insufficient documentation

## 2020-10-09 DIAGNOSIS — E785 Hyperlipidemia, unspecified: Secondary | ICD-10-CM | POA: Insufficient documentation

## 2020-10-09 DIAGNOSIS — Z7984 Long term (current) use of oral hypoglycemic drugs: Secondary | ICD-10-CM | POA: Diagnosis not present

## 2020-10-09 DIAGNOSIS — M7989 Other specified soft tissue disorders: Secondary | ICD-10-CM | POA: Insufficient documentation

## 2020-10-09 DIAGNOSIS — R948 Abnormal results of function studies of other organs and systems: Secondary | ICD-10-CM | POA: Insufficient documentation

## 2020-10-09 DIAGNOSIS — I10 Essential (primary) hypertension: Secondary | ICD-10-CM | POA: Insufficient documentation

## 2020-10-09 DIAGNOSIS — Z7901 Long term (current) use of anticoagulants: Secondary | ICD-10-CM | POA: Insufficient documentation

## 2020-10-09 DIAGNOSIS — I252 Old myocardial infarction: Secondary | ICD-10-CM | POA: Insufficient documentation

## 2020-10-09 LAB — CMP (CANCER CENTER ONLY)
ALT: 10 U/L (ref 0–44)
AST: 19 U/L (ref 15–41)
Albumin: 3.9 g/dL (ref 3.5–5.0)
Alkaline Phosphatase: 57 U/L (ref 38–126)
Anion gap: 9 (ref 5–15)
BUN: 12 mg/dL (ref 8–23)
CO2: 27 mmol/L (ref 22–32)
Calcium: 9.7 mg/dL (ref 8.9–10.3)
Chloride: 107 mmol/L (ref 98–111)
Creatinine: 0.93 mg/dL (ref 0.44–1.00)
GFR, Estimated: >60 mL/min (ref >60)
Glucose, Bld: 118 mg/dL — ABNORMAL HIGH (ref 70–99)
Potassium: 3.8 mmol/L (ref 3.5–5.1)
Sodium: 143 mmol/L (ref 135–145)
Total Bilirubin: 0.8 mg/dL (ref 0.3–1.2)
Total Protein: 6.7 g/dL (ref 6.5–8.1)

## 2020-10-09 LAB — CBC WITH DIFFERENTIAL (CANCER CENTER ONLY)
Abs Immature Granulocytes: 0.03 10*3/uL (ref 0.00–0.07)
Basophils Absolute: 0 10*3/uL (ref 0.0–0.1)
Basophils Relative: 0 %
Eosinophils Absolute: 0 10*3/uL (ref 0.0–0.5)
Eosinophils Relative: 1 %
HCT: 33.7 % — ABNORMAL LOW (ref 36.0–46.0)
Hemoglobin: 10.8 g/dL — ABNORMAL LOW (ref 12.0–15.0)
Immature Granulocytes: 1 %
Lymphocytes Relative: 21 %
Lymphs Abs: 0.6 10*3/uL — ABNORMAL LOW (ref 0.7–4.0)
MCH: 35.5 pg — ABNORMAL HIGH (ref 26.0–34.0)
MCHC: 32 g/dL (ref 30.0–36.0)
MCV: 110.9 fL — ABNORMAL HIGH (ref 80.0–100.0)
Monocytes Absolute: 0.2 10*3/uL (ref 0.1–1.0)
Monocytes Relative: 7 %
Neutro Abs: 2.1 10*3/uL (ref 1.7–7.7)
Neutrophils Relative %: 70 %
Platelet Count: 485 10*3/uL — ABNORMAL HIGH (ref 150–400)
RBC: 3.04 MIL/uL — ABNORMAL LOW (ref 3.87–5.11)
RDW: 14.2 % (ref 11.5–15.5)
WBC Count: 2.9 10*3/uL — ABNORMAL LOW (ref 4.0–10.5)
nRBC: 0 % (ref 0.0–0.2)

## 2020-10-09 LAB — LACTATE DEHYDROGENASE: LDH: 262 U/L — ABNORMAL HIGH (ref 98–192)

## 2020-10-09 NOTE — Progress Notes (Signed)
[facility] [phone]   [phone]  OFFICE PROGRESS NOTE  [name], MD [place] [address]  DIAGNOSIS: Essential thrombocythemia with positive JAK2 mutation [id number] diagnosed in October 2020.  PRIOR THERAPY: None  CURRENT THERAPY: Hydroxyurea 500 mg p.o. daily.   INTERVAL HISTORY: [name] [name] 74 y.o. female returns to the clinic for follow-up visit.  The patient is feeling fine today with no concerning complaints.  She denied having any chest pain, shortness of breath, cough or hemoptysis.  She denied having any fever or chills.  She has no nausea, vomiting, diarrhea or constipation.  She has no headache or visual changes.  She had swelling of the left lower extremity and she had a Doppler performed recently that showed no evidence of deep venous thrombosis.  She has been tolerating her treatment with hydroxyurea fairly well.  She is here today for evaluation and repeat blood work.    MEDICAL HISTORY: Past Medical History:  Diagnosis Date  . Anemia   . Coronary artery disease    s/p stent to LAD and LCx  . DM II (diabetes mellitus, type II), controlled ([facility])   . DVT (deep venous thrombosis) ([facility])   . Elevated LFTs   . Hyperlipidemia   . Hypertension   . Myocardial infarction ([place] [facility])    7 yrs ago    ALLERGIES:  is allergic to ampicillin, avelox [moxifloxacin hcl in nacl], bactrim [sulfamethoxazole-trimethoprim], ibuprofen, penicillins, codeine, and latex.  MEDICATIONS:  Current Outpatient Medications  Medication Sig Dispense Refill  . fenofibrate micronized (LOFIBRA) 200 MG capsule TAKE ONE CAPSULE BY MOUTH ONCE DAILY BEFORE BREAKFAST 90 capsule 2  . ferrous sulfate 325 (65 FE) MG EC tablet Take 325 mg by mouth daily.    . Fish Oil-Cholecalciferol (FISH OIL + D3) 1000-1000 MG-UNIT CAPS Take 1 tablet by mouth 3 (three) times daily.    . hydroxyurea (HYDREA) 500 MG capsule TAKE 1 CAPSULE BY MOUTH ONCE DAILY  WITH FOOD TO  MINIMIZE  GI  SIDE  EFFECTS. 30 capsule 2  . metFORMIN (GLUCOPHAGE) 500 MG tablet Take 500 mg by mouth 2 (two) times daily with a meal.      . metoprolol tartrate (LOPRESSOR) 25 MG tablet Take 1 tablet (25 mg total) by mouth 2 (two) times daily. 180 tablet 3  . nitroGLYCERIN (NITROSTAT) 0.4 MG SL tablet Place 1 tablet (0.4 mg total) under the tongue every 5 (five) minutes as needed. For chest pain. 25 tablet 0  . pentoxifylline (TRENTAL) 400 MG CR tablet Take 1 tablet (400 mg total) by mouth 3 (three) times daily with meals. 90 tablet 3  . prochlorperazine (COMPAZINE) 10 MG tablet Take 1 tablet (10 mg total) by mouth every 6 (six) hours as needed for nausea or vomiting. 30 tablet 2  . rivaroxaban (XARELTO) 20 MG TABS tablet Take 20 mg by mouth daily with supper.    . rosuvastatin (CRESTOR) 20 MG tablet Take 10 mg by mouth daily.     No current facility-administered medications for this visit.    SURGICAL HISTORY:  Past Surgical History:  Procedure Laterality Date  . CATARACT EXTRACTION    . CESAREAN SECTION  1983  . CORONARY ANGIOPLASTY WITH STENT PLACEMENT  04/26/2003   NORMAL. EF 65%  . I & D EXTREMITY  06/22/2012   Procedure: IRRIGATION AND DEBRIDEMENT EXTREMITY;  Surgeon: Tennis Must, MD;  Location: [facility];  Service: Orthopedics;  Laterality: Left;  .  I & D EXTREMITY  06/29/2012   Procedure: IRRIGATION AND DEBRIDEMENT EXTREMITY;  Surgeon: Tennis Must, MD;  Location: [facility];  Service: Orthopedics;  Laterality: Left;  . OPEN REDUCTION INTERNAL FIXATION (ORIF) DISTAL RADIAL FRACTURE Right 02/04/2018   Procedure: OPEN REDUCTION INTERNAL FIXATION (ORIF) RIGHT DISTAL RADIAL FRACTURE;  Surgeon: Leanora Cover, MD;  Location: [facility];  Service: Orthopedics;  Laterality: Right;    REVIEW OF SYSTEMS:  A comprehensive review of systems was negative except for: Constitutional: positive for fatigue   PHYSICAL EXAMINATION: General appearance: alert, cooperative and no  distress Head: Normocephalic, without obvious abnormality, atraumatic Neck: no adenopathy, no JVD, supple, symmetrical, trachea midline and thyroid not enlarged, symmetric, no tenderness/mass/nodules Lymph nodes: Cervical, supraclavicular, and axillary nodes normal. Resp: clear to auscultation bilaterally Back: symmetric, no curvature. ROM normal. No CVA tenderness. Cardio: regular rate and rhythm, S1, S2 normal, no murmur, click, rub or gallop GI: soft, non-tender; bowel sounds normal; no masses,  no organomegaly Extremities: extremities normal, atraumatic, no cyanosis or edema  ECOG PERFORMANCE STATUS: 1 - Symptomatic but completely ambulatory  Blood pressure (!) 123/55, pulse 60, temperature 98 F (36.7 C), temperature source Tympanic, resp. rate 14, height 5\' 2"  (1.575 m), weight 128 lb 14.4 oz (58.5 kg), SpO2 100 %.  LABORATORY DATA: Lab Results  Component Value Date   WBC 2.9 (L) 10/09/2020   HGB 10.8 (L) 10/09/2020   HCT 33.7 (L) 10/09/2020   MCV 110.9 (H) 10/09/2020   PLT 485 (H) 10/09/2020      Chemistry      Component Value Date/Time   NA 143 10/09/2020 1059   NA 146 (H) 01/04/2020 1006   K 3.8 10/09/2020 1059   CL 107 10/09/2020 1059   CO2 27 10/09/2020 1059   BUN 12 10/09/2020 1059   BUN 13 01/04/2020 1006   CREATININE 0.93 10/09/2020 1059      Component Value Date/Time   CALCIUM 9.7 10/09/2020 1059   ALKPHOS 57 10/09/2020 1059   AST 19 10/09/2020 1059   ALT 10 10/09/2020 1059   BILITOT 0.8 10/09/2020 1059       RADIOGRAPHIC STUDIES: VAS [place] LOWER EXTREMITY VENOUS (DVT)  Result Date: 09/17/2020  Lower Venous DVT Study Indications: Swelling, and Edema.  Comparison Study: no prior Performing Technologist: Abram Sander RVS  Examination Guidelines: A complete evaluation includes B-mode imaging, spectral Doppler, color Doppler, and power Doppler as needed of all accessible portions of each vessel. Bilateral testing is considered an integral part of a complete  examination. Limited examinations for reoccurring indications may be performed as noted. The reflux portion of the exam is performed with the patient in reverse Trendelenburg.  +-----+---------------+---------+-----------+----------+--------------+ RIGHTCompressibilityPhasicitySpontaneityPropertiesThrombus Aging +-----+---------------+---------+-----------+----------+--------------+ CFV  Full           Yes      Yes                                 +-----+---------------+---------+-----------+----------+--------------+   +---------+---------------+---------+-----------+----------+--------------+ LEFT     CompressibilityPhasicitySpontaneityPropertiesThrombus Aging +---------+---------------+---------+-----------+----------+--------------+ CFV      Full           Yes      Yes                                 +---------+---------------+---------+-----------+----------+--------------+ SFJ      Full                                                        +---------+---------------+---------+-----------+----------+--------------+  FV Prox  Full                                                        +---------+---------------+---------+-----------+----------+--------------+ FV Mid   Full                                                        +---------+---------------+---------+-----------+----------+--------------+ FV DistalFull                                                        +---------+---------------+---------+-----------+----------+--------------+ PFV      Full                                                        +---------+---------------+---------+-----------+----------+--------------+ POP      Full           Yes      Yes                                 +---------+---------------+---------+-----------+----------+--------------+ PTV      Full                                                         +---------+---------------+---------+-----------+----------+--------------+ PERO     Full                                                        +---------+---------------+---------+-----------+----------+--------------+     Summary: RIGHT: - No evidence of common femoral vein obstruction.  LEFT: - There is no evidence of deep vein thrombosis in the lower extremity.  - No cystic structure found in the popliteal fossa.  *See table(s) above for measurements and observations. Electronically signed by Servando Snare MD on 09/17/2020 at 4:30:03 PM.    Final     ASSESSMENT AND PLAN: This is a very pleasant 74 years old white female recently diagnosed with essential thrombocythemia with positive JAK2 mutation.  The patient also has anemia and leukocytosis secondary to the myeloproliferative disorder. The patient is currently on treatment with hydroxyurea initially at 500 mg increased to 1000 mg but the patient could not tolerate the higher dose well and we switched her back to 500 mg p.o. daily. The patient has been tolerating this treatment well with no concerning adverse effect except for mild fatigue. Repeat CBC and comprehensive metabolic panel today are very similar to her previous blood work 6 weeks ago with mild leukocytopenia,  anemia as well as thrombocytosis. I recommended for the patient to continue her current treatment with hydroxyurea with the same dose. For the swelling of the left lower extremity she is followed by her primary care physician and Dr. Sharol Given. The patient will come back for follow-up visit in 6 weeks for evaluation with repeat blood work. She was advised to call immediately if she has any concerning symptoms in the interval.  The patient voices understanding of current disease status and treatment options and is in agreement with the current care plan.  All questions were answered. The patient knows to call the clinic with any problems, questions or concerns. We can certainly  see the patient much sooner if necessary.  Disclaimer: This note was dictated with voice recognition software. Similar sounding words can inadvertently be transcribed and may not be corrected upon review.

## 2020-10-15 ENCOUNTER — Telehealth: Payer: Self-pay | Admitting: Internal Medicine

## 2020-10-15 NOTE — Telephone Encounter (Signed)
Scheduled per los. Called and spoke with patients daughter. Confirmed appt  °

## 2020-11-05 ENCOUNTER — Ambulatory Visit: Payer: Self-pay | Admitting: Orthopedic Surgery

## 2020-11-05 ENCOUNTER — Other Ambulatory Visit: Payer: Self-pay

## 2020-11-05 ENCOUNTER — Telehealth: Payer: Self-pay | Admitting: Orthopedic Surgery

## 2020-11-05 DIAGNOSIS — D8989 Other specified disorders involving the immune mechanism, not elsewhere classified: Secondary | ICD-10-CM | POA: Diagnosis not present

## 2020-11-05 DIAGNOSIS — F329 Major depressive disorder, single episode, unspecified: Secondary | ICD-10-CM | POA: Diagnosis not present

## 2020-11-05 DIAGNOSIS — M8580 Other specified disorders of bone density and structure, unspecified site: Secondary | ICD-10-CM | POA: Diagnosis not present

## 2020-11-05 DIAGNOSIS — I119 Hypertensive heart disease without heart failure: Secondary | ICD-10-CM | POA: Diagnosis not present

## 2020-11-05 DIAGNOSIS — E785 Hyperlipidemia, unspecified: Secondary | ICD-10-CM | POA: Diagnosis not present

## 2020-11-05 DIAGNOSIS — D473 Essential (hemorrhagic) thrombocythemia: Secondary | ICD-10-CM | POA: Diagnosis not present

## 2020-11-05 DIAGNOSIS — I2510 Atherosclerotic heart disease of native coronary artery without angina pectoris: Secondary | ICD-10-CM | POA: Diagnosis not present

## 2020-11-05 DIAGNOSIS — Z7901 Long term (current) use of anticoagulants: Secondary | ICD-10-CM | POA: Diagnosis not present

## 2020-11-05 DIAGNOSIS — I878 Other specified disorders of veins: Secondary | ICD-10-CM | POA: Diagnosis not present

## 2020-11-05 DIAGNOSIS — E1165 Type 2 diabetes mellitus with hyperglycemia: Secondary | ICD-10-CM | POA: Diagnosis not present

## 2020-11-05 DIAGNOSIS — Z86718 Personal history of other venous thrombosis and embolism: Secondary | ICD-10-CM | POA: Diagnosis not present

## 2020-11-05 DIAGNOSIS — I252 Old myocardial infarction: Secondary | ICD-10-CM | POA: Diagnosis not present

## 2020-11-05 MED ORDER — PENTOXIFYLLINE ER 400 MG PO TBCR: 400.0000 mg | EXTENDED_RELEASE_TABLET | Freq: Three times a day (TID) | ORAL | 3 refills | Status: DC

## 2020-11-05 NOTE — Telephone Encounter (Signed)
Sw pt's daughter and she cx appt for today due to Covid exposure. Refilled trental encouraged wearing compression sock round the clock as instructed at last visit. Per daughter pt is having discomfort and is taking tylenol prn pain and wants to know if there is anything else that she can do for this.

## 2020-11-05 NOTE — Telephone Encounter (Signed)
Patient's daughter Alvis Lemmings called requesting a refill of pentoxifylline. Also [name] would like a call back from Autumn F. She has a medical question. Please send to pharmacy on file. Patient's daughter  phone number is [phone].

## 2020-11-06 NOTE — Telephone Encounter (Signed)
Continue with compression socks around-the-clock continue with elevation will evaluate for further compression wraps with follow-up on Monday

## 2020-11-07 NOTE — Telephone Encounter (Signed)
I called and sw pt's daughter and advised of below. Voiced understanding advised that if anything changes at all we are available to pt any day and we can move appt up for evaluation at any time. Daughter states she checked in with her mother and she is " sore" will follow instructions and call with any questions.

## 2020-11-12 ENCOUNTER — Ambulatory Visit (INDEPENDENT_AMBULATORY_CARE_PROVIDER_SITE_OTHER): Payer: Medicare Other | Admitting: Orthopedic Surgery

## 2020-11-12 ENCOUNTER — Encounter: Payer: Self-pay | Admitting: Orthopedic Surgery

## 2020-11-12 DIAGNOSIS — I872 Venous insufficiency (chronic) (peripheral): Secondary | ICD-10-CM | POA: Diagnosis not present

## 2020-11-12 MED ORDER — PENTOXIFYLLINE ER 400 MG PO TBCR: 400.0000 mg | EXTENDED_RELEASE_TABLET | Freq: Three times a day (TID) | ORAL | 3 refills | Status: DC

## 2020-11-12 NOTE — Progress Notes (Signed)
Office Visit Note   Patient: [name] [name]           Date of Birth: 1946-10-12           MRN: [id number] Visit Date: 11/12/2020              Requested by: [name], [place],  [place] [address] PCP: [name], MD  Chief Complaint  Patient presents with  . Left Leg - Follow-up    Left lower extremity swelling      HPI: Patient is a 75 year old woman who was seen with her daughter.  She has venous insufficiency and is currently wearing compression stockings and using Trental  Assessment & Plan: Visit Diagnoses:  1. Venous stasis dermatitis of both lower extremities     Plan: Recommend he continue wearing the compression stockings use the Trental until swelling has solved enough and then discontinue the Trental and continue with compression stockings.  Recommended the longer she can wear the stockings the better and recommended exercise to work the calf pump.  Follow-Up Instructions: No follow-ups on file.   Ortho Exam  Patient is alert, oriented, no adenopathy, well-dressed, normal affect, normal respiratory effort. Examination patient's right calf is 35 cm in circumference she is wearing a size medium sock this is appropriate for the right leg as well.  She does have brawny edema in the left leg but no cellulitis no pain with passive dorsiflexion of the ankle.  She still has global pitting edema.  Imaging: No results found. No images are attached to the encounter.  Labs: Lab Results  Component Value Date   REPTSTATUS 07/01/2012 FINAL 06/29/2012   REPTSTATUS 07/04/2012 FINAL 06/29/2012   GRAMSTAIN  06/29/2012    FEW WBC PRESENT,BOTH PMN AND MONONUCLEAR NO SQUAMOUS EPITHELIAL CELLS SEEN NO ORGANISMS SEEN   GRAMSTAIN  06/29/2012    FEW WBC PRESENT,BOTH PMN AND MONONUCLEAR NO SQUAMOUS EPITHELIAL CELLS SEEN NO ORGANISMS SEEN   CULT NO GROWTH 2 DAYS 06/29/2012   CULT NO ANAEROBES ISOLATED 06/29/2012     Lab Results  Component Value Date    ALBUMIN 3.9 10/09/2020   ALBUMIN 3.9 07/10/2020   ALBUMIN 4.0 05/31/2020    No results found for: MG No results found for: VD25OH  No results found for: PREALBUMIN CBC EXTENDED Latest Ref Rng & Units 10/09/2020 08/28/2020 07/10/2020  WBC 4.0 - 10.5 K/uL 2.9(L) 3.0(L) 2.7(L)  RBC 3.87 - 5.11 MIL/uL 3.04(L) 2.90(L) 2.97(L)  HGB 12.0 - 15.0 g/dL 10.8(L) 10.4(L) 11.0(L)  HCT 36.0 - 46.0 % 33.7(L) 31.8(L) 33.3(L)  PLT 150 - 400 K/uL 485(H) 402(H) 349  NEUTROABS 1.7 - 7.7 K/uL 2.1 2.3 1.8  LYMPHSABS 0.7 - 4.0 K/uL 0.6(L) 0.5(L) 0.7     There is no height or weight on file to calculate BMI.  Orders:  No orders of the defined types were placed in this encounter.  Meds ordered this encounter  Medications  . pentoxifylline (TRENTAL) 400 MG CR tablet    Sig: Take 1 tablet (400 mg total) by mouth 3 (three) times daily with meals.    Dispense:  90 tablet    Refill:  3     Procedures: No procedures performed  Clinical Data: No additional findings.  ROS:  All other systems negative, except as noted in the HPI. Review of Systems  Objective: Vital Signs: There were no vitals taken for this visit.  Specialty Comments:  No specialty comments available.  PMFS History: Patient Active Problem  List   Diagnosis Date Noted  . Essential thrombocythemia ([facility]) 10/05/2019  . Nausea without vomiting 10/05/2019  . Encounter for antineoplastic chemotherapy 09/21/2019  . Goals of care, counseling/discussion 09/21/2019  . Anemia 09/07/2019  . Leukocytosis 09/07/2019  . Mixed hyperlipidemia 09/01/2016  . DVT (deep venous thrombosis) ([facility]) 09/18/2014  . Drug-induced hypersensitivity reaction 06/28/2012  . Fever 06/28/2012  . Lactic acidosis 06/28/2012  . Hand abscess 06/28/2012  . Hyponatremia 06/28/2012  . Hypertension 06/28/2012  . DM2 (diabetes mellitus, type 2) ([facility]) 06/28/2012  . Petechiae 06/28/2012  . CAD (coronary artery disease) 04/28/2011   Past Medical History:   Diagnosis Date  . Anemia   . Coronary artery disease    s/p stent to LAD and LCx  . DM II (diabetes mellitus, type II), controlled ([facility])   . DVT (deep venous thrombosis) ([facility])   . Elevated LFTs   . Hyperlipidemia   . Hypertension   . Myocardial infarction ([facility])    7 yrs ago    Family History  Problem Relation Age of Onset  . Heart attack Mother        X2  . Aneurysm Mother        AAA  . Stroke Mother   . Heart disease Mother   . Heart attack Father   . Heart disease Father   . Cirrhosis Father   . Osteoarthritis Sister   . Dementia Sister     Past Surgical History:  Procedure Laterality Date  . CATARACT EXTRACTION    . CESAREAN SECTION  1983  . CORONARY ANGIOPLASTY WITH STENT PLACEMENT  04/26/2003   NORMAL. EF 65%  . I & D EXTREMITY  06/22/2012   Procedure: IRRIGATION AND DEBRIDEMENT EXTREMITY;  Surgeon: Tennis Must, MD;  Location: [facility];  Service: Orthopedics;  Laterality: Left;  . I & D EXTREMITY  06/29/2012   Procedure: IRRIGATION AND DEBRIDEMENT EXTREMITY;  Surgeon: Tennis Must, MD;  Location: [facility];  Service: Orthopedics;  Laterality: Left;  . OPEN REDUCTION INTERNAL FIXATION (ORIF) DISTAL RADIAL FRACTURE Right 02/04/2018   Procedure: OPEN REDUCTION INTERNAL FIXATION (ORIF) RIGHT DISTAL RADIAL FRACTURE;  Surgeon: Leanora Cover, MD;  Location: [facility];  Service: Orthopedics;  Laterality: Right;   Social History   Occupational History  . Not on file  Tobacco Use  . Smoking status: Never Smoker  . Smokeless tobacco: Never Used  Vaping Use  . Vaping Use: Never used  Substance and Sexual Activity  . Alcohol use: Yes    Comment: social   . Drug use: No  . Sexual activity: Not on file

## 2020-11-23 DIAGNOSIS — Z23 Encounter for immunization: Secondary | ICD-10-CM | POA: Diagnosis not present

## 2020-12-03 ENCOUNTER — Other Ambulatory Visit: Payer: Self-pay | Admitting: Physician Assistant

## 2020-12-03 ENCOUNTER — Telehealth: Payer: Self-pay | Admitting: Medical Oncology

## 2020-12-03 DIAGNOSIS — Z5111 Encounter for antineoplastic chemotherapy: Secondary | ICD-10-CM

## 2020-12-03 DIAGNOSIS — D473 Essential (hemorrhagic) thrombocythemia: Secondary | ICD-10-CM

## 2020-12-03 MED ORDER — HYDROXYUREA 500 MG PO CAPS: ORAL_CAPSULE | ORAL | 2 refills | Status: DC

## 2020-12-03 NOTE — Telephone Encounter (Signed)
hydrea refill requested. [facility] [place]

## 2020-12-11 ENCOUNTER — Inpatient Hospital Stay: Payer: Medicare Other | Attending: Internal Medicine | Admitting: Internal Medicine

## 2020-12-11 ENCOUNTER — Other Ambulatory Visit: Payer: Self-pay

## 2020-12-11 ENCOUNTER — Inpatient Hospital Stay: Payer: Medicare Other

## 2020-12-11 ENCOUNTER — Telehealth: Payer: Self-pay | Admitting: Orthopedic Surgery

## 2020-12-11 VITALS — BP 126/46 | HR 58 | Temp 97.8°F | Resp 14 | Ht 62.0 in | Wt 124.0 lb

## 2020-12-11 DIAGNOSIS — D649 Anemia, unspecified: Secondary | ICD-10-CM | POA: Diagnosis not present

## 2020-12-11 DIAGNOSIS — D473 Essential (hemorrhagic) thrombocythemia: Secondary | ICD-10-CM | POA: Diagnosis not present

## 2020-12-11 DIAGNOSIS — D72829 Elevated white blood cell count, unspecified: Secondary | ICD-10-CM | POA: Diagnosis not present

## 2020-12-11 LAB — CMP (CANCER CENTER ONLY)
ALT: 7 U/L (ref 0–44)
AST: 16 U/L (ref 15–41)
Albumin: 3.8 g/dL (ref 3.5–5.0)
Alkaline Phosphatase: 40 U/L (ref 38–126)
Anion gap: 6 (ref 5–15)
BUN: 15 mg/dL (ref 8–23)
CO2: 27 mmol/L (ref 22–32)
Calcium: 9.1 mg/dL (ref 8.9–10.3)
Chloride: 109 mmol/L (ref 98–111)
Creatinine: 0.83 mg/dL (ref 0.44–1.00)
GFR, Estimated: >60 mL/min (ref >60)
Glucose, Bld: 106 mg/dL — ABNORMAL HIGH (ref 70–99)
Potassium: 3.8 mmol/L (ref 3.5–5.1)
Sodium: 142 mmol/L (ref 135–145)
Total Bilirubin: 0.5 mg/dL (ref 0.3–1.2)
Total Protein: 6.1 g/dL — ABNORMAL LOW (ref 6.5–8.1)

## 2020-12-11 LAB — CBC WITH DIFFERENTIAL (CANCER CENTER ONLY)
Abs Immature Granulocytes: 0 10*3/uL (ref 0.00–0.07)
Band Neutrophils: 2 %
Basophils Absolute: 0 10*3/uL (ref 0.0–0.1)
Basophils Relative: 0 %
Eosinophils Absolute: 0 10*3/uL (ref 0.0–0.5)
Eosinophils Relative: 1 %
HCT: 31 % — ABNORMAL LOW (ref 36.0–46.0)
Hemoglobin: 10 g/dL — ABNORMAL LOW (ref 12.0–15.0)
Lymphocytes Relative: 31 %
Lymphs Abs: 0.6 10*3/uL — ABNORMAL LOW (ref 0.7–4.0)
MCH: 36.4 pg — ABNORMAL HIGH (ref 26.0–34.0)
MCHC: 32.3 g/dL (ref 30.0–36.0)
MCV: 112.7 fL — ABNORMAL HIGH (ref 80.0–100.0)
Monocytes Absolute: 0.2 10*3/uL (ref 0.1–1.0)
Monocytes Relative: 11 %
Neutro Abs: 1 10*3/uL — ABNORMAL LOW (ref 1.7–7.7)
Neutrophils Relative %: 55 %
Platelet Count: 304 10*3/uL (ref 150–400)
RBC: 2.75 MIL/uL — ABNORMAL LOW (ref 3.87–5.11)
RDW: 15.1 % (ref 11.5–15.5)
WBC Count: 1.8 10*3/uL — ABNORMAL LOW (ref 4.0–10.5)
nRBC: 0 % (ref 0.0–0.2)

## 2020-12-11 LAB — LACTATE DEHYDROGENASE: LDH: 197 U/L — ABNORMAL HIGH (ref 98–192)

## 2020-12-11 NOTE — Telephone Encounter (Signed)
Call patient, discontinue trental and continue compression socks

## 2020-12-11 NOTE — Telephone Encounter (Signed)
[name] pts daughter states she has a question about one of the rx the pt is on she states she's a little concerned about the side effects.   [phone]

## 2020-12-11 NOTE — Telephone Encounter (Signed)
Spoke with pt's daughter [name]. Pt had appt with Oncologist and reviewed blood work and her levels have drastically changed. When asked about any changes in medication the only change has been the introduction of Trental TID. Side effects of medication can be platelet reduction and they have adjusted her chemo a bit but wanted to see if Dr. Sharol Given would also consider decrease in the amount of trental being used to treat pts PVD.

## 2020-12-11 NOTE — Progress Notes (Signed)
[address] [phone]   [phone]  OFFICE PROGRESS NOTE  [name], MD [place] [address]  DIAGNOSIS: Essential thrombocythemia with positive JAK2 mutation V617F diagnosed in October 2020.  PRIOR THERAPY: None  CURRENT THERAPY: Hydroxyurea 500 mg p.o. daily.   INTERVAL HISTORY: [name] [name] 75 y.o. female returns to the clinic today for follow-up visit accompanied by [name].  The patient is feeling fine today with no concerning complaints except for mild fatigue.  She denied having any current chest pain, shortness of breath, cough or hemoptysis.  She denied having any fever or chills.  She started treatment with Trental 3 times a day for peripheral vascular disease by her cardiologist.  The patient continues to tolerate her treatment with hydroxyurea fairly well.  She is here today for evaluation and repeat blood work.      MEDICAL HISTORY: Past Medical History:  Diagnosis Date  . Anemia   . Coronary artery disease    s/p stent to LAD and LCx  . DM II (diabetes mellitus, type II), controlled ([place])   . DVT (deep venous thrombosis) ([facility])   . Elevated LFTs   . Hyperlipidemia   . Hypertension   . Myocardial infarction ([facility] [place])    7 yrs ago    ALLERGIES:  is allergic to ampicillin, avelox [moxifloxacin hcl in nacl], bactrim [sulfamethoxazole-trimethoprim], ibuprofen, penicillins, codeine, and latex.  MEDICATIONS:  Current Outpatient Medications  Medication Sig Dispense Refill  . fenofibrate micronized (LOFIBRA) 200 MG capsule TAKE ONE CAPSULE BY MOUTH ONCE DAILY BEFORE BREAKFAST 90 capsule 2  . ferrous sulfate 325 (65 FE) MG EC tablet Take 325 mg by mouth daily.    . Fish Oil-Cholecalciferol (FISH OIL + D3) 1000-1000 MG-UNIT CAPS Take 1 tablet by mouth 3 (three) times daily.    . hydroxyurea (HYDREA) 500 MG capsule TAKE 1 CAPSULE BY MOUTH ONCE DAILY WITH FOOD TO  MINIMIZE  GI  SIDE  EFFECTS. 30 capsule  2  . metFORMIN (GLUCOPHAGE) 500 MG tablet Take 500 mg by mouth 2 (two) times daily with a meal.    . nitroGLYCERIN (NITROSTAT) 0.4 MG SL tablet Place 1 tablet (0.4 mg total) under the tongue every 5 (five) minutes as needed. For chest pain. 25 tablet 0  . pentoxifylline (TRENTAL) 400 MG CR tablet Take 1 tablet (400 mg total) by mouth 3 (three) times daily with meals. 90 tablet 3  . prochlorperazine (COMPAZINE) 10 MG tablet Take 1 tablet (10 mg total) by mouth every 6 (six) hours as needed for nausea or vomiting. 30 tablet 2  . rivaroxaban (XARELTO) 20 MG TABS tablet Take 20 mg by mouth daily with supper.    . rosuvastatin (CRESTOR) 20 MG tablet Take 10 mg by mouth daily.    . metoprolol tartrate (LOPRESSOR) 25 MG tablet Take 1 tablet (25 mg total) by mouth 2 (two) times daily. 180 tablet 3   No current facility-administered medications for this visit.    SURGICAL HISTORY:  Past Surgical History:  Procedure Laterality Date  . CATARACT EXTRACTION    . CESAREAN SECTION  1983  . CORONARY ANGIOPLASTY WITH STENT PLACEMENT  04/26/2003   NORMAL. EF 65%  . I & D EXTREMITY  06/22/2012   Procedure: IRRIGATION AND DEBRIDEMENT EXTREMITY;  Surgeon: Tennis Must, MD;  Location: [facility];  Service: Orthopedics;  Laterality: Left;  . I & D EXTREMITY  06/29/2012   Procedure: IRRIGATION AND DEBRIDEMENT  EXTREMITY;  Surgeon: Tennis Must, MD;  Location: [facility];  Service: Orthopedics;  Laterality: Left;  . OPEN REDUCTION INTERNAL FIXATION (ORIF) DISTAL RADIAL FRACTURE Right 02/04/2018   Procedure: OPEN REDUCTION INTERNAL FIXATION (ORIF) RIGHT DISTAL RADIAL FRACTURE;  Surgeon: Leanora Cover, MD;  Location: [facility];  Service: Orthopedics;  Laterality: Right;    REVIEW OF SYSTEMS:  A comprehensive review of systems was negative except for: Constitutional: positive for fatigue   PHYSICAL EXAMINATION: General appearance: alert, cooperative and no distress Head: Normocephalic, without obvious  abnormality, atraumatic Neck: no adenopathy, no JVD, supple, symmetrical, trachea midline and thyroid not enlarged, symmetric, no tenderness/mass/nodules Lymph nodes: Cervical, supraclavicular, and axillary nodes normal. Resp: clear to auscultation bilaterally Back: symmetric, no curvature. ROM normal. No CVA tenderness. Cardio: regular rate and rhythm, S1, S2 normal, no murmur, click, rub or gallop GI: soft, non-tender; bowel sounds normal; no masses,  no organomegaly Extremities: extremities normal, atraumatic, no cyanosis or edema  ECOG PERFORMANCE STATUS: 1 - Symptomatic but completely ambulatory  Blood pressure (!) 126/46, pulse (!) 58, temperature 97.8 F (36.6 C), temperature source Tympanic, resp. rate 14, height 5\' 2"  (1.575 m), weight 124 lb (56.2 kg), SpO2 100 %.  LABORATORY DATA: Lab Results  Component Value Date   WBC 1.8 (L) 12/11/2020   HGB 10.0 (L) 12/11/2020   HCT 31.0 (L) 12/11/2020   MCV 112.7 (H) 12/11/2020   PLT 304 12/11/2020      Chemistry      Component Value Date/Time   NA 143 10/09/2020 1059   NA 146 (H) 01/04/2020 1006   K 3.8 10/09/2020 1059   CL 107 10/09/2020 1059   CO2 27 10/09/2020 1059   BUN 12 10/09/2020 1059   BUN 13 01/04/2020 1006   CREATININE 0.93 10/09/2020 1059      Component Value Date/Time   CALCIUM 9.7 10/09/2020 1059   ALKPHOS 57 10/09/2020 1059   AST 19 10/09/2020 1059   ALT 10 10/09/2020 1059   BILITOT 0.8 10/09/2020 1059       RADIOGRAPHIC STUDIES: No results found.  ASSESSMENT AND PLAN: This is a very pleasant 75 years old white female recently diagnosed with essential thrombocythemia with positive JAK2 mutation.  The patient also has anemia and leukocytosis secondary to the myeloproliferative disorder. The patient is currently on treatment with hydroxyurea initially at 500 mg increased to 1000 mg but the patient could not tolerate the higher dose well and we switched her back to 500 mg p.o. daily. The patient has been  tolerating her treatment well with no concerning adverse effects. Repeat CBC today showed worsening leukocytopenia and neutropenia. This could be secondary to her treatment with hydroxyurea but Trental also has some low percentage of pancytopenia and leukopenia. I recommended for the patient to hold her treatment with hydroxyurea for 1 week and then she will start on 500 mg p.o. every other day for 2 more weeks before her upcoming visit with me in 3 weeks. The patient was also advised to discuss with her cardiologist adjusting the dose of Trental. She was advised to call immediately if she has any other concerning symptoms in the interval.  She was advised to call immediately if she has any concerning symptoms in the interval.  The patient voices understanding of current disease status and treatment options and is in agreement with the current care plan.  All questions were answered. The patient knows to call the clinic with any problems, questions or concerns. We can  certainly see the patient much sooner if necessary.  Disclaimer: This note was dictated with voice recognition software. Similar sounding words can inadvertently be transcribed and may not be corrected upon review.       

## 2020-12-12 NOTE — Telephone Encounter (Signed)
I called and sw dawn to advise of message below. Voiced understanding and will call with any other questions.

## 2020-12-13 ENCOUNTER — Telehealth: Payer: Self-pay | Admitting: Internal Medicine

## 2020-12-13 NOTE — Telephone Encounter (Signed)
Scheduled per 2/8 los. Called pt and left a msg

## 2020-12-27 ENCOUNTER — Other Ambulatory Visit: Payer: Self-pay | Admitting: Internal Medicine

## 2020-12-27 DIAGNOSIS — Z1231 Encounter for screening mammogram for malignant neoplasm of breast: Secondary | ICD-10-CM

## 2021-01-01 ENCOUNTER — Encounter: Payer: Self-pay | Admitting: Internal Medicine

## 2021-01-01 ENCOUNTER — Other Ambulatory Visit: Payer: Self-pay

## 2021-01-01 ENCOUNTER — Telehealth: Payer: Self-pay | Admitting: Oncology

## 2021-01-01 ENCOUNTER — Inpatient Hospital Stay: Payer: Medicare Other | Attending: Internal Medicine | Admitting: Internal Medicine

## 2021-01-01 ENCOUNTER — Inpatient Hospital Stay: Payer: Medicare Other

## 2021-01-01 VITALS — BP 119/46 | HR 61 | Temp 98.8°F | Resp 16 | Ht 62.0 in | Wt 124.5 lb

## 2021-01-01 DIAGNOSIS — D649 Anemia, unspecified: Secondary | ICD-10-CM | POA: Insufficient documentation

## 2021-01-01 DIAGNOSIS — D473 Essential (hemorrhagic) thrombocythemia: Secondary | ICD-10-CM | POA: Diagnosis not present

## 2021-01-01 DIAGNOSIS — E119 Type 2 diabetes mellitus without complications: Secondary | ICD-10-CM | POA: Diagnosis not present

## 2021-01-01 DIAGNOSIS — Z5111 Encounter for antineoplastic chemotherapy: Secondary | ICD-10-CM

## 2021-01-01 DIAGNOSIS — E785 Hyperlipidemia, unspecified: Secondary | ICD-10-CM | POA: Diagnosis not present

## 2021-01-01 DIAGNOSIS — D751 Secondary polycythemia: Secondary | ICD-10-CM | POA: Diagnosis not present

## 2021-01-01 DIAGNOSIS — Z86718 Personal history of other venous thrombosis and embolism: Secondary | ICD-10-CM | POA: Diagnosis not present

## 2021-01-01 DIAGNOSIS — I2510 Atherosclerotic heart disease of native coronary artery without angina pectoris: Secondary | ICD-10-CM | POA: Diagnosis not present

## 2021-01-01 DIAGNOSIS — I10 Essential (primary) hypertension: Secondary | ICD-10-CM | POA: Insufficient documentation

## 2021-01-01 DIAGNOSIS — Z7984 Long term (current) use of oral hypoglycemic drugs: Secondary | ICD-10-CM | POA: Diagnosis not present

## 2021-01-01 DIAGNOSIS — Z7901 Long term (current) use of anticoagulants: Secondary | ICD-10-CM | POA: Diagnosis not present

## 2021-01-01 DIAGNOSIS — Z79899 Other long term (current) drug therapy: Secondary | ICD-10-CM | POA: Diagnosis not present

## 2021-01-01 DIAGNOSIS — I252 Old myocardial infarction: Secondary | ICD-10-CM | POA: Insufficient documentation

## 2021-01-01 LAB — CBC WITH DIFFERENTIAL (CANCER CENTER ONLY)
Abs Immature Granulocytes: 0 10*3/uL (ref 0.00–0.07)
Band Neutrophils: 2 %
Basophils Absolute: 0 10*3/uL (ref 0.0–0.1)
Basophils Relative: 1 %
Eosinophils Absolute: 0 10*3/uL (ref 0.0–0.5)
Eosinophils Relative: 1 %
HCT: 33.8 % — ABNORMAL LOW (ref 36.0–46.0)
Hemoglobin: 10.7 g/dL — ABNORMAL LOW (ref 12.0–15.0)
Lymphocytes Relative: 28 %
Lymphs Abs: 0.9 10*3/uL (ref 0.7–4.0)
MCH: 35.3 pg — ABNORMAL HIGH (ref 26.0–34.0)
MCHC: 31.7 g/dL (ref 30.0–36.0)
MCV: 111.6 fL — ABNORMAL HIGH (ref 80.0–100.0)
Monocytes Absolute: 0.1 10*3/uL (ref 0.1–1.0)
Monocytes Relative: 3 %
Neutro Abs: 2.1 10*3/uL (ref 1.7–7.7)
Neutrophils Relative %: 65 %
Platelet Count: 563 10*3/uL — ABNORMAL HIGH (ref 150–400)
RBC: 3.03 MIL/uL — ABNORMAL LOW (ref 3.87–5.11)
RDW: 13.9 % (ref 11.5–15.5)
WBC Count: 3.1 10*3/uL — ABNORMAL LOW (ref 4.0–10.5)
nRBC: 0 % (ref 0.0–0.2)

## 2021-01-01 LAB — CMP (CANCER CENTER ONLY)
ALT: 11 U/L (ref 0–44)
AST: 22 U/L (ref 15–41)
Albumin: 4.1 g/dL (ref 3.5–5.0)
Alkaline Phosphatase: 49 U/L (ref 38–126)
Anion gap: 9 (ref 5–15)
BUN: 18 mg/dL (ref 8–23)
CO2: 28 mmol/L (ref 22–32)
Calcium: 9.5 mg/dL (ref 8.9–10.3)
Chloride: 106 mmol/L (ref 98–111)
Creatinine: 0.86 mg/dL (ref 0.44–1.00)
GFR, Estimated: >60 mL/min (ref >60)
Glucose, Bld: 102 mg/dL — ABNORMAL HIGH (ref 70–99)
Potassium: 4.3 mmol/L (ref 3.5–5.1)
Sodium: 143 mmol/L (ref 135–145)
Total Bilirubin: 0.5 mg/dL (ref 0.3–1.2)
Total Protein: 6.7 g/dL (ref 6.5–8.1)

## 2021-01-01 LAB — LACTATE DEHYDROGENASE: LDH: 335 U/L — ABNORMAL HIGH (ref 98–192)

## 2021-01-01 NOTE — Telephone Encounter (Signed)
Scheduled appointment per 03/01 los. Patient is aware, received calender.

## 2021-01-01 NOTE — Progress Notes (Signed)
[facility] [phone]   [phone]  OFFICE PROGRESS NOTE  [name], MD [place] [address]  DIAGNOSIS: Essential thrombocythemia with positive JAK2 mutation V617F diagnosed in October 2020.  PRIOR THERAPY: None  CURRENT THERAPY: Hydroxyurea 500 mg p.o. daily.   INTERVAL HISTORY: [name] [name] 75 y.o. female returns to the clinic today for follow-up visit.  The patient is feeling much better today.  She discontinued her treatment with Trental.  She also was on hydroxyurea every other day for the last 2 weeks and tolerated it fairly well.  She denied having any current chest pain, shortness of breath, cough or hemoptysis.  She denied having any fever or chills.  She has no nausea, vomiting, diarrhea or constipation.  She has no bleeding, bruises or ecchymosis.  The patient is here today for evaluation and repeat blood work.    MEDICAL HISTORY: Past Medical History:  Diagnosis Date  . Anemia   . Coronary artery disease    s/p stent to LAD and LCx  . DM II (diabetes mellitus, type II), controlled ([facility])   . DVT (deep venous thrombosis) ([facility])   . Elevated LFTs   . Hyperlipidemia   . Hypertension   . Myocardial infarction ([place])    7 yrs ago    ALLERGIES:  is allergic to ampicillin, avelox [moxifloxacin hcl in nacl], bactrim [sulfamethoxazole-trimethoprim], ibuprofen, penicillins, codeine, and latex.  MEDICATIONS:  Current Outpatient Medications  Medication Sig Dispense Refill  . fenofibrate micronized (LOFIBRA) 200 MG capsule TAKE ONE CAPSULE BY MOUTH ONCE DAILY BEFORE BREAKFAST 90 capsule 2  . ferrous sulfate 325 (65 FE) MG EC tablet Take 325 mg by mouth daily.    . Fish Oil-Cholecalciferol (FISH OIL + D3) 1000-1000 MG-UNIT CAPS Take 1 tablet by mouth 3 (three) times daily.    . hydroxyurea (HYDREA) 500 MG capsule TAKE 1 CAPSULE BY MOUTH ONCE DAILY WITH FOOD TO  MINIMIZE  GI  SIDE  EFFECTS. 30 capsule 2  .  metFORMIN (GLUCOPHAGE) 500 MG tablet Take 500 mg by mouth 2 (two) times daily with a meal.    . metoprolol tartrate (LOPRESSOR) 25 MG tablet Take 1 tablet (25 mg total) by mouth 2 (two) times daily. 180 tablet 3  . nitroGLYCERIN (NITROSTAT) 0.4 MG SL tablet Place 1 tablet (0.4 mg total) under the tongue every 5 (five) minutes as needed. For chest pain. 25 tablet 0  . pentoxifylline (TRENTAL) 400 MG CR tablet Take 1 tablet (400 mg total) by mouth 3 (three) times daily with meals. 90 tablet 3  . prochlorperazine (COMPAZINE) 10 MG tablet Take 1 tablet (10 mg total) by mouth every 6 (six) hours as needed for nausea or vomiting. 30 tablet 2  . rivaroxaban (XARELTO) 20 MG TABS tablet Take 20 mg by mouth daily with supper.    . rosuvastatin (CRESTOR) 20 MG tablet Take 10 mg by mouth daily.     No current facility-administered medications for this visit.    SURGICAL HISTORY:  Past Surgical History:  Procedure Laterality Date  . CATARACT EXTRACTION    . CESAREAN SECTION  1983  . CORONARY ANGIOPLASTY WITH STENT PLACEMENT  04/26/2003   NORMAL. EF 65%  . I & D EXTREMITY  06/22/2012   Procedure: IRRIGATION AND DEBRIDEMENT EXTREMITY;  Surgeon: Tennis Must, MD;  Location: [facility];  Service: Orthopedics;  Laterality: Left;  . I & D EXTREMITY  06/29/2012   Procedure: IRRIGATION AND  DEBRIDEMENT EXTREMITY;  Surgeon: Tennis Must, MD;  Location: [facility];  Service: Orthopedics;  Laterality: Left;  . OPEN REDUCTION INTERNAL FIXATION (ORIF) DISTAL RADIAL FRACTURE Right 02/04/2018   Procedure: OPEN REDUCTION INTERNAL FIXATION (ORIF) RIGHT DISTAL RADIAL FRACTURE;  Surgeon: Leanora Cover, MD;  Location: [facility];  Service: Orthopedics;  Laterality: Right;    REVIEW OF SYSTEMS:  A comprehensive review of systems was negative except for: Constitutional: positive for fatigue   PHYSICAL EXAMINATION: General appearance: alert, cooperative and no distress Head: Normocephalic, without obvious abnormality,  atraumatic Neck: no adenopathy, no JVD, supple, symmetrical, trachea midline and thyroid not enlarged, symmetric, no tenderness/mass/nodules Lymph nodes: Cervical, supraclavicular, and axillary nodes normal. Resp: clear to auscultation bilaterally Back: symmetric, no curvature. ROM normal. No CVA tenderness. Cardio: regular rate and rhythm, S1, S2 normal, no murmur, click, rub or gallop GI: soft, non-tender; bowel sounds normal; no masses,  no organomegaly Extremities: extremities normal, atraumatic, no cyanosis or edema  ECOG PERFORMANCE STATUS: 1 - Symptomatic but completely ambulatory  Blood pressure (!) 119/46, pulse 61, temperature 98.8 F (37.1 C), temperature source Tympanic, resp. rate 16, height 5\' 2"  (1.575 m), weight 124 lb 8 oz (56.5 kg), SpO2 100 %.  LABORATORY DATA: Lab Results  Component Value Date   WBC 3.1 (L) 01/01/2021   HGB 10.7 (L) 01/01/2021   HCT 33.8 (L) 01/01/2021   MCV 111.6 (H) 01/01/2021   PLT 563 (H) 01/01/2021      Chemistry      Component Value Date/Time   NA 143 01/01/2021 1025   NA 146 (H) 01/04/2020 1006   K 4.3 01/01/2021 1025   CL 106 01/01/2021 1025   CO2 28 01/01/2021 1025   BUN 18 01/01/2021 1025   BUN 13 01/04/2020 1006   CREATININE 0.86 01/01/2021 1025      Component Value Date/Time   CALCIUM 9.5 01/01/2021 1025   ALKPHOS 49 01/01/2021 1025   AST 22 01/01/2021 1025   ALT 11 01/01/2021 1025   BILITOT 0.5 01/01/2021 1025       RADIOGRAPHIC STUDIES: No results found.  ASSESSMENT AND PLAN: This is a very pleasant 75 years old white female recently diagnosed with essential thrombocythemia with positive JAK2 mutation.  The patient also has anemia and leukocytosis secondary to the myeloproliferative disorder. The patient is currently on treatment with hydroxyurea initially at 500 mg increased to 1000 mg but the patient could not tolerate the higher dose well and we switched her back to 500 mg p.o. daily.  Over the last 2 weeks she  has been on 500 mg every other day. The patient continues to tolerate her treatment well with no concerning adverse effects. Repeat CBC today showed improvement of her total white blood count as well as hemoglobin and hematocrit but the platelets count increased to 563,000. I recommended for the patient to resume her treatment with hydroxyurea 500 mg on daily basis. I will see her back for follow-up visit in 3 weeks for evaluation and repeat blood work. She was advised to call immediately if she has any other concerning symptoms in the interval. The patient voices understanding of current disease status and treatment options and is in agreement with the current care plan.  All questions were answered. The patient knows to call the clinic with any problems, questions or concerns. We can certainly see the patient much sooner if necessary.  Disclaimer: This note was dictated with voice recognition software. Similar sounding words can inadvertently be  transcribed and may not be corrected upon review.

## 2021-01-18 ENCOUNTER — Other Ambulatory Visit: Payer: Self-pay | Admitting: Physician Assistant

## 2021-01-18 ENCOUNTER — Telehealth: Payer: Self-pay

## 2021-01-18 DIAGNOSIS — D473 Essential (hemorrhagic) thrombocythemia: Secondary | ICD-10-CM

## 2021-01-18 DIAGNOSIS — Z5111 Encounter for antineoplastic chemotherapy: Secondary | ICD-10-CM

## 2021-01-18 MED ORDER — HYDROXYUREA 500 MG PO CAPS: ORAL_CAPSULE | ORAL | 2 refills | Status: DC

## 2021-01-18 NOTE — Telephone Encounter (Signed)
Pt request a refill of Hydrea be sent to [facility] [address]

## 2021-01-22 ENCOUNTER — Inpatient Hospital Stay (HOSPITAL_BASED_OUTPATIENT_CLINIC_OR_DEPARTMENT_OTHER): Payer: Medicare Other | Admitting: Internal Medicine

## 2021-01-22 ENCOUNTER — Other Ambulatory Visit: Payer: Self-pay

## 2021-01-22 ENCOUNTER — Telehealth: Payer: Self-pay | Admitting: Internal Medicine

## 2021-01-22 ENCOUNTER — Inpatient Hospital Stay: Payer: Medicare Other

## 2021-01-22 VITALS — BP 123/47 | HR 63 | Temp 99.5°F | Resp 17 | Ht 62.0 in | Wt 125.6 lb

## 2021-01-22 DIAGNOSIS — E785 Hyperlipidemia, unspecified: Secondary | ICD-10-CM | POA: Diagnosis not present

## 2021-01-22 DIAGNOSIS — D473 Essential (hemorrhagic) thrombocythemia: Secondary | ICD-10-CM | POA: Diagnosis not present

## 2021-01-22 DIAGNOSIS — D751 Secondary polycythemia: Secondary | ICD-10-CM | POA: Diagnosis not present

## 2021-01-22 DIAGNOSIS — E119 Type 2 diabetes mellitus without complications: Secondary | ICD-10-CM | POA: Diagnosis not present

## 2021-01-22 DIAGNOSIS — I2510 Atherosclerotic heart disease of native coronary artery without angina pectoris: Secondary | ICD-10-CM | POA: Diagnosis not present

## 2021-01-22 DIAGNOSIS — Z5111 Encounter for antineoplastic chemotherapy: Secondary | ICD-10-CM

## 2021-01-22 DIAGNOSIS — I10 Essential (primary) hypertension: Secondary | ICD-10-CM | POA: Diagnosis not present

## 2021-01-22 DIAGNOSIS — D649 Anemia, unspecified: Secondary | ICD-10-CM | POA: Diagnosis not present

## 2021-01-22 LAB — CMP (CANCER CENTER ONLY)
ALT: 10 U/L (ref 0–44)
AST: 18 U/L (ref 15–41)
Albumin: 3.9 g/dL (ref 3.5–5.0)
Alkaline Phosphatase: 43 U/L (ref 38–126)
Anion gap: 9 (ref 5–15)
BUN: 20 mg/dL (ref 8–23)
CO2: 27 mmol/L (ref 22–32)
Calcium: 9.2 mg/dL (ref 8.9–10.3)
Chloride: 106 mmol/L (ref 98–111)
Creatinine: 0.83 mg/dL (ref 0.44–1.00)
GFR, Estimated: >60 mL/min (ref >60)
Glucose, Bld: 65 mg/dL — ABNORMAL LOW (ref 70–99)
Potassium: 4.1 mmol/L (ref 3.5–5.1)
Sodium: 142 mmol/L (ref 135–145)
Total Bilirubin: 0.6 mg/dL (ref 0.3–1.2)
Total Protein: 6.3 g/dL — ABNORMAL LOW (ref 6.5–8.1)

## 2021-01-22 LAB — CBC WITH DIFFERENTIAL (CANCER CENTER ONLY)
Abs Immature Granulocytes: 0 10*3/uL (ref 0.00–0.07)
Basophils Absolute: 0 10*3/uL (ref 0.0–0.1)
Basophils Relative: 0 %
Eosinophils Absolute: 0 10*3/uL (ref 0.0–0.5)
Eosinophils Relative: 1 %
HCT: 31.6 % — ABNORMAL LOW (ref 36.0–46.0)
Hemoglobin: 10.4 g/dL — ABNORMAL LOW (ref 12.0–15.0)
Lymphocytes Relative: 30 %
Lymphs Abs: 0.7 10*3/uL (ref 0.7–4.0)
MCH: 35.6 pg — ABNORMAL HIGH (ref 26.0–34.0)
MCHC: 32.9 g/dL (ref 30.0–36.0)
MCV: 108.2 fL — ABNORMAL HIGH (ref 80.0–100.0)
Monocytes Absolute: 0.2 10*3/uL (ref 0.1–1.0)
Monocytes Relative: 8 %
Neutro Abs: 1.5 10*3/uL — ABNORMAL LOW (ref 1.7–7.7)
Neutrophils Relative %: 61 %
Platelet Count: 439 10*3/uL — ABNORMAL HIGH (ref 150–400)
RBC: 2.92 MIL/uL — ABNORMAL LOW (ref 3.87–5.11)
RDW: 14.3 % (ref 11.5–15.5)
WBC Count: 2.4 10*3/uL — ABNORMAL LOW (ref 4.0–10.5)
nRBC: 0 % (ref 0.0–0.2)

## 2021-01-22 LAB — LACTATE DEHYDROGENASE: LDH: 259 U/L — ABNORMAL HIGH (ref 98–192)

## 2021-01-22 NOTE — Telephone Encounter (Signed)
Scheduled per los. Gave avs and calendar  

## 2021-01-22 NOTE — Progress Notes (Signed)
[facility] [phone]) [phone]   Fax:(336) [phone]  OFFICE PROGRESS NOTE  [name], MD [place] [address]  DIAGNOSIS: Essential thrombocythemia with positive JAK2 mutation V617F diagnosed in October 2020.  PRIOR THERAPY: None  CURRENT THERAPY: Hydroxyurea 500 mg p.o. daily.   INTERVAL HISTORY: [name] [name] 75 y.o. female returns to the clinic today for follow-up visit accompanied by [name].  The patient is feeling fine today with no concerning complaints.  She resumed her treatment with hydroxyurea 500 mg p.o. daily and tolerating it well.  She has no current chest pain, shortness of breath, cough or hemoptysis.  She denied having any fever or chills.  She has no nausea, vomiting, diarrhea or constipation.  She has no headache or visual changes.  She has no bleeding, bruises or ecchymosis.  The patient is here today for evaluation and repeat blood work.    MEDICAL HISTORY: Past Medical History:  Diagnosis Date  . Anemia   . Coronary artery disease    s/p stent to LAD and LCx  . DM II (diabetes mellitus, type II), controlled ([facility])   . DVT (deep venous thrombosis) ([facility])   . Elevated LFTs   . Hyperlipidemia   . Hypertension   . Myocardial infarction ([facility])    7 yrs ago    ALLERGIES:  is allergic to ampicillin, avelox [moxifloxacin hcl in nacl], bactrim [sulfamethoxazole-trimethoprim], ibuprofen, penicillins, codeine, and latex.  MEDICATIONS:  Current Outpatient Medications  Medication Sig Dispense Refill  . fenofibrate micronized (LOFIBRA) 200 MG capsule TAKE ONE CAPSULE BY MOUTH ONCE DAILY BEFORE BREAKFAST 90 capsule 2  . ferrous sulfate 325 (65 FE) MG EC tablet Take 325 mg by mouth daily. (Patient not taking: Reported on 01/01/2021)    . Fish Oil-Cholecalciferol (FISH OIL + D3) 1000-1000 MG-UNIT CAPS Take 1 tablet by mouth 3 (three) times daily.    . hydroxyurea (HYDREA) 500 MG capsule TAKE 1 CAPSULE BY MOUTH ONCE  DAILY WITH FOOD TO  MINIMIZE  GI  SIDE  EFFECTS. 30 capsule 2  . metFORMIN (GLUCOPHAGE) 500 MG tablet Take 500 mg by mouth 2 (two) times daily with a meal.    . metoprolol tartrate (LOPRESSOR) 25 MG tablet Take 1 tablet (25 mg total) by mouth 2 (two) times daily. 180 tablet 3  . nitroGLYCERIN (NITROSTAT) 0.4 MG SL tablet Place 1 tablet (0.4 mg total) under the tongue every 5 (five) minutes as needed. For chest pain. (Patient not taking: Reported on 01/01/2021) 25 tablet 0  . OVER THE COUNTER MEDICATION Take 25 mg by mouth daily. ZINC    . prochlorperazine (COMPAZINE) 10 MG tablet Take 1 tablet (10 mg total) by mouth every 6 (six) hours as needed for nausea or vomiting. (Patient not taking: Reported on 01/01/2021) 30 tablet 2  . rivaroxaban (XARELTO) 20 MG TABS tablet Take 20 mg by mouth daily with supper.    . rosuvastatin (CRESTOR) 20 MG tablet Take 10 mg by mouth daily.     No current facility-administered medications for this visit.    SURGICAL HISTORY:  Past Surgical History:  Procedure Laterality Date  . CATARACT EXTRACTION    . CESAREAN SECTION  1983  . CORONARY ANGIOPLASTY WITH STENT PLACEMENT  04/26/2003   NORMAL. EF 65%  . I & D EXTREMITY  06/22/2012   Procedure: IRRIGATION AND DEBRIDEMENT EXTREMITY;  Surgeon: Tennis Must, MD;  Location: [facility];  Service: Orthopedics;  Laterality: Left;  .  I & D EXTREMITY  06/29/2012   Procedure: IRRIGATION AND DEBRIDEMENT EXTREMITY;  Surgeon: Tennis Must, MD;  Location: [facility];  Service: Orthopedics;  Laterality: Left;  . OPEN REDUCTION INTERNAL FIXATION (ORIF) DISTAL RADIAL FRACTURE Right 02/04/2018   Procedure: OPEN REDUCTION INTERNAL FIXATION (ORIF) RIGHT DISTAL RADIAL FRACTURE;  Surgeon: Leanora Cover, MD;  Location: [facility];  Service: Orthopedics;  Laterality: Right;    REVIEW OF SYSTEMS:  A comprehensive review of systems was negative.   PHYSICAL EXAMINATION: General appearance: alert, cooperative and no distress Head:  Normocephalic, without obvious abnormality, atraumatic Neck: no adenopathy, no JVD, supple, symmetrical, trachea midline and thyroid not enlarged, symmetric, no tenderness/mass/nodules Lymph nodes: Cervical, supraclavicular, and axillary nodes normal. Resp: clear to auscultation bilaterally Back: symmetric, no curvature. ROM normal. No CVA tenderness. Cardio: regular rate and rhythm, S1, S2 normal, no murmur, click, rub or gallop GI: soft, non-tender; bowel sounds normal; no masses,  no organomegaly Extremities: extremities normal, atraumatic, no cyanosis or edema  ECOG PERFORMANCE STATUS: 1 - Symptomatic but completely ambulatory  Blood pressure (!) 123/47, pulse 63, temperature 99.5 F (37.5 C), temperature source Tympanic, resp. rate 17, height 5\' 2"  (1.575 m), weight 125 lb 9.6 oz (57 kg), SpO2 100 %.  LABORATORY DATA: Lab Results  Component Value Date   WBC 2.4 (L) 01/22/2021   HGB 10.4 (L) 01/22/2021   HCT 31.6 (L) 01/22/2021   MCV 108.2 (H) 01/22/2021   PLT 439 (H) 01/22/2021      Chemistry      Component Value Date/Time   NA 143 01/01/2021 1025   NA 146 (H) 01/04/2020 1006   K 4.3 01/01/2021 1025   CL 106 01/01/2021 1025   CO2 28 01/01/2021 1025   BUN 18 01/01/2021 1025   BUN 13 01/04/2020 1006   CREATININE 0.86 01/01/2021 1025      Component Value Date/Time   CALCIUM 9.5 01/01/2021 1025   ALKPHOS 49 01/01/2021 1025   AST 22 01/01/2021 1025   ALT 11 01/01/2021 1025   BILITOT 0.5 01/01/2021 1025       RADIOGRAPHIC STUDIES: No results found.  ASSESSMENT AND PLAN: This is a very pleasant 75 years old white female recently diagnosed with essential thrombocythemia with positive JAK2 mutation.  The patient also has anemia and leukocytosis secondary to the myeloproliferative disorder. The patient is currently on treatment with hydroxyurea initially at 500 mg increased to 1000 mg but the patient could not tolerate the higher dose well and we switched her back to 500  mg p.o. daily.  The patient continues to tolerate the hydroxyurea 500 mg p.o. daily fairly well. Repeat CBC today showed persistent leukocytopenia and anemia.  Her platelets count are decreasing to close the normal range. I recommended for her to continue on the same treatment for now.  I will see her back for follow-up visit in 1 months for evaluation and repeat blood work. She was advised to call immediately if she has any concerning symptoms in the interval. The patient voices understanding of current disease status and treatment options and is in agreement with the current care plan.  All questions were answered. The patient knows to call the clinic with any problems, questions or concerns. We can certainly see the patient much sooner if necessary.  Disclaimer: This note was dictated with voice recognition software. Similar sounding words can inadvertently be transcribed and may not be corrected upon review.

## 2021-01-28 ENCOUNTER — Encounter: Payer: Self-pay | Admitting: Cardiovascular Disease

## 2021-01-28 NOTE — Progress Notes (Signed)
Cardiology Office Note   Date:  01/29/2021   ID:  [name] [name], [name] [name] 1946-08-22, MRN [id number]  PCP:  [name], MD  Cardiologist:   Mertie Moores, MD   Chief Complaint  Patient presents with  . Coronary Artery Disease  . Hypertension   1. Coronary artery disease-status post PTCA and stenting of her left complex artery. She status post PTCA and stenting of her right coronary artery in [place] in 2005. 2. Hyperlipidemia 3. Hypertension 4. Bilateral DVT -  2015 , on anticoagulation   [name] [name] is a 75 y.o. female with the above noted hx. She has had lots of problems with her husband who has bipolar disease ( will not take his meds). Otherwise she is doing well. She is still walking some but needs to get back into it  Nov. 16, 2015:  [name] [name] is a 75 y.o. female who I see for hx of CAD - hx of PCI / stenting of her LCX She was found to have a DVT in both legs.  She was seen by Dr. Virgina Jock.  No clear etiology was found    Mar 19, 2015 : [name] [name] is a 75 y.o. female who presents for  CAD   She has done well.  She had DVT in Sept. 2015. Still on xarelto.   Nov. 15, 2016: Doing well.  Has some fatigue and chest tightness toward the end of the day. No CP with exertion.   Husband has been diagnosed with Parkinsons .  Lots of stress.   Mar 05, 2016:  [name] [name] is seen today  BP is a bit high.   Is not eating more salt than usual -   Oct. 30, 2017:  [name] [name] is seen today  No cardiac issues. No angina  Is frustrated with her husband who has Parkinsons and likely has some degree of dementia. He does not shower regularly   March 02, 2017:  [name] [name] is seen today for follow up visit for CAD, HTN,  and hyperlipidemia .  Is doing well .  No dyspnea. Has some fatigue at the end of the day which goes away after she relaxes.   Is not walking much  Able to mow her entire lawn without any problems.   January 03, 2020 [name] [name] is seen today for follow up of her CAD, HTN, and  hyperlpidemia  Seen with her daughter, [name] [name] today  She has a history of essential thrombocythemia with a positive JAK2 mutation V617F  which was diagnosed October 2020.  She is seeing Dr. Mora Appl. Has anemia and elevated platelet counts.   2 units of PRBC on New Years day   Has rare episodes in CP when she is out in the cold  No exercise,  Has not been walking  January 29, 2021: [name] [name] is seen today for follow up of her CAD, HTN, HLD Has had a dry mouth ,  Has seen ENT.  Has episodes of orthostatic hypotension   Past Medical History:  Diagnosis Date  . Anemia   . Coronary artery disease    s/p stent to LAD and LCx  . DM II (diabetes mellitus, type II), controlled ([facility])   . DVT (deep venous thrombosis) ([facility])   . Elevated LFTs   . Hyperlipidemia   . Hypertension   . Myocardial infarction ([facility])    7 yrs ago    Past Surgical History:  Procedure Laterality Date  . CATARACT EXTRACTION    . CESAREAN SECTION  [place] WITH STENT PLACEMENT  04/26/2003   NORMAL. EF 65%  . I & D EXTREMITY  06/22/2012   Procedure: IRRIGATION AND DEBRIDEMENT EXTREMITY;  Surgeon: Tennis Must, MD;  Location: [facility];  Service: Orthopedics;  Laterality: Left;  . I & D EXTREMITY  06/29/2012   Procedure: IRRIGATION AND DEBRIDEMENT EXTREMITY;  Surgeon: Tennis Must, MD;  Location: [facility];  Service: Orthopedics;  Laterality: Left;  . OPEN REDUCTION INTERNAL FIXATION (ORIF) DISTAL RADIAL FRACTURE Right 02/04/2018   Procedure: OPEN REDUCTION INTERNAL FIXATION (ORIF) RIGHT DISTAL RADIAL FRACTURE;  Surgeon: Leanora Cover, MD;  Location: [facility];  Service: Orthopedics;  Laterality: Right;     Current Outpatient Medications  Medication Sig Dispense Refill  . fenofibrate micronized (LOFIBRA) 200 MG capsule TAKE ONE CAPSULE BY MOUTH ONCE DAILY BEFORE BREAKFAST 90 capsule 2  . ferrous sulfate 325 (65 FE) MG EC tablet Take 325 mg by mouth daily.    . Fish Oil-Cholecalciferol (FISH OIL +  D3) 1000-1000 MG-UNIT CAPS Take 1 tablet by mouth 3 (three) times daily.    . hydroxyurea (HYDREA) 500 MG capsule TAKE 1 CAPSULE BY MOUTH ONCE DAILY WITH FOOD TO  MINIMIZE  GI  SIDE  EFFECTS. 30 capsule 2  . metFORMIN (GLUCOPHAGE) 500 MG tablet Take 500 mg by mouth 2 (two) times daily with a meal.    . nitroGLYCERIN (NITROSTAT) 0.4 MG SL tablet Place 1 tablet (0.4 mg total) under the tongue every 5 (five) minutes as needed. For chest pain. 25 tablet 0  . OVER THE COUNTER MEDICATION Take 25 mg by mouth daily. ZINC    . prochlorperazine (COMPAZINE) 10 MG tablet Take 1 tablet (10 mg total) by mouth every 6 (six) hours as needed for nausea or vomiting. 30 tablet 2  . rivaroxaban (XARELTO) 20 MG TABS tablet Take 20 mg by mouth daily with supper.    . rosuvastatin (CRESTOR) 20 MG tablet Take 10 mg by mouth daily.    . Zinc 25 MG TABS Take 1 tablet by mouth 2 (two) times daily.     No current facility-administered medications for this visit.    Allergies:   Ampicillin, Avelox [moxifloxacin hcl in nacl], Bactrim [sulfamethoxazole-trimethoprim], Ibuprofen, Penicillins, Codeine, and Latex    Social History:  The patient  reports that she has never smoked. She has never used smokeless tobacco. She reports current alcohol use. She reports that she does not use drugs.   Family History:  The patient's family history includes Aneurysm in her mother; Cirrhosis in her father; Dementia in her sister; Heart attack in her father and mother; Heart disease in her father and mother; Osteoarthritis in her sister; Stroke in her mother.    ROS:  Please see the history of present illness.     All other systems are reviewed and negative.   Physical Exam: Blood pressure (!) 102/50, pulse (!) 55, height 5\' 2"  (1.575 m), weight 126 lb 3.2 oz (57.2 kg), SpO2 99 %.  GEN:  Well nourished, well developed in no acute distress HEENT: Normal NECK: No JVD; No carotid bruits LYMPHATICS: No lymphadenopathy CARDIAC: RRR,  2/6  systolic mummur  RESPIRATORY:  Clear to auscultation without rales, wheezing or rhonchi  ABDOMEN: Soft, non-tender, non-distended MUSCULOSKELETAL:  No edema; No deformity  SKIN: Warm and dry NEUROLOGIC:  Alert and oriented x 3    EKG: January 29, 2021:  Sinus brady  , 1st degree AV block    Recent  Labs: 01/22/2021: ALT 10; BUN 20; Creatinine 0.83; Hemoglobin 10.4; Platelet Count 439; Potassium 4.1; Sodium 142    Lipid Panel    Component Value Date/Time   CHOL 101 01/04/2020 1006   TRIG 83 01/04/2020 1006   HDL 39 (L) 01/04/2020 1006   CHOLHDL 2.6 01/04/2020 1006   LDLCALC 45 01/04/2020 1006      Wt Readings from Last 3 Encounters:  01/29/21 126 lb 3.2 oz (57.2 kg)  01/22/21 125 lb 9.6 oz (57 kg)  01/01/21 124 lb 8 oz (56.5 kg)      Other studies Reviewed: Additional studies/ records that were reviewed today include: . Review of the above records demonstrates:    ASSESSMENT AND PLAN:  1. Coronary artery disease-   Stable ,  No angina   2. Hyperlipidemia-   Check lipds today ,  Cont meds.    3. Hypertension  - BP is a bit low today .    She has been having episodes of orthostatic hypotension . Will Dc metoprolol   4.DVT -   5.  Carotid bruit versus radiated systolic murmur:    6.  Systolic murmur.   - has mild AS   Current medicines are reviewed at length with the patient today.  The patient has concerns regarding medicines.  The following changes have been made:  no change  Labs/ tests ordered today include:   Orders Placed This Encounter  Procedures  . ALT  . Basic metabolic panel  . Lipid panel  . EKG 12-Lead     Disposition:   FU with me in 1 year     Mertie Moores, MD  01/29/2021 10:59 AM    [facility] [address], [place], [place]  [address] Phone: [phone]; Fax: [phone]

## 2021-01-29 ENCOUNTER — Ambulatory Visit (INDEPENDENT_AMBULATORY_CARE_PROVIDER_SITE_OTHER): Payer: Medicare Other | Admitting: Cardiovascular Disease

## 2021-01-29 ENCOUNTER — Other Ambulatory Visit: Payer: Self-pay

## 2021-01-29 ENCOUNTER — Encounter: Payer: Self-pay | Admitting: Cardiovascular Disease

## 2021-01-29 VITALS — BP 102/50 | HR 55 | Ht 62.0 in | Wt 126.2 lb

## 2021-01-29 DIAGNOSIS — I2510 Atherosclerotic heart disease of native coronary artery without angina pectoris: Secondary | ICD-10-CM | POA: Diagnosis not present

## 2021-01-29 LAB — BASIC METABOLIC PANEL
BUN/Creatinine Ratio: 21 (ref 12–28)
BUN: 17 mg/dL (ref 8–27)
CO2: 25 mmol/L (ref 20–29)
Calcium: 9.3 mg/dL (ref 8.7–10.3)
Chloride: 104 mmol/L (ref 96–106)
Creatinine, Ser: 0.82 mg/dL (ref 0.57–1.00)
Glucose: 88 mg/dL (ref 65–99)
Potassium: 5 mmol/L (ref 3.5–5.2)
Sodium: 141 mmol/L (ref 134–144)
eGFR: 75 mL/min/{1.73_m2} (ref >59)

## 2021-01-29 LAB — LIPID PANEL
Chol/HDL Ratio: 3.2 ratio (ref 0.0–4.4)
Cholesterol, Total: 97 mg/dL — ABNORMAL LOW (ref 100–199)
HDL: 30 mg/dL — ABNORMAL LOW (ref >39)
LDL Chol Calc (NIH): 47 mg/dL (ref 0–99)
Triglycerides: 105 mg/dL (ref 0–149)
VLDL Cholesterol Cal: 20 mg/dL (ref 5–40)

## 2021-01-29 LAB — ALT: ALT: 8 IU/L (ref 0–32)

## 2021-01-29 NOTE — Patient Instructions (Signed)
Medication Instructions:  Your physician has recommended you make the following change in your medication:   STOP Metoprolol tartrate (Lopressor)  *If you need a refill on your cardiac medications before your next appointment, please call your pharmacy*   Lab Work: Today: LIPIDS, ALT, BMET If you have labs (blood work) drawn today and your tests are completely normal, you will receive your results only by: Marland Kitchen MyChart Message (if you have MyChart) OR . A paper copy in the mail If you have any lab test that is abnormal or we need to change your treatment, we will call you to review the results.   Testing/Procedures: none   Follow-Up: At [facility], you and your health needs are our priority.  As part of our continuing mission to provide you with exceptional heart care, we have created designated Provider Care Teams.  These Care Teams include your primary Cardiologist (physician) and Advanced Practice Providers (APPs -  Physician Assistants and Nurse Practitioners) who all work together to provide you with the care you need, when you need it.  We recommend signing up for the patient portal called "MyChart".  Sign up information is provided on this After Visit Summary.  MyChart is used to connect with patients for Virtual Visits (Telemedicine).  Patients are able to view lab/test results, encounter notes, upcoming appointments, etc.  Non-urgent messages can be sent to your provider as well.   To learn more about what you can do with MyChart, go to [email or web address].    Your next appointment:   1 year(s)  The format for your next appointment:   In Person  Provider:   You may see Mertie Moores, MD or one of the following Advanced Practice Providers on your designated Care Team:    Richardson Dopp, PA-C  [place], [place]

## 2021-02-18 ENCOUNTER — Ambulatory Visit
Admission: RE | Admit: 2021-02-18 | Discharge: 2021-02-18 | Disposition: A | Discharge status: Home / Self Care | Payer: Medicare Other | Source: Ambulatory Visit | Attending: Internal Medicine | Admitting: Internal Medicine

## 2021-02-18 ENCOUNTER — Other Ambulatory Visit: Payer: Self-pay

## 2021-02-18 DIAGNOSIS — Z1231 Encounter for screening mammogram for malignant neoplasm of breast: Secondary | ICD-10-CM

## 2021-02-21 ENCOUNTER — Inpatient Hospital Stay: Payer: Medicare Other

## 2021-02-21 ENCOUNTER — Other Ambulatory Visit: Payer: Self-pay

## 2021-02-21 ENCOUNTER — Inpatient Hospital Stay: Payer: Medicare Other | Attending: Internal Medicine | Admitting: Internal Medicine

## 2021-02-21 VITALS — BP 128/49 | HR 69 | Temp 98.5°F | Resp 18 | Ht 62.0 in | Wt 125.5 lb

## 2021-02-21 DIAGNOSIS — D75839 Thrombocytosis, unspecified: Secondary | ICD-10-CM | POA: Insufficient documentation

## 2021-02-21 DIAGNOSIS — I252 Old myocardial infarction: Secondary | ICD-10-CM | POA: Insufficient documentation

## 2021-02-21 DIAGNOSIS — E785 Hyperlipidemia, unspecified: Secondary | ICD-10-CM | POA: Insufficient documentation

## 2021-02-21 DIAGNOSIS — I2510 Atherosclerotic heart disease of native coronary artery without angina pectoris: Secondary | ICD-10-CM

## 2021-02-21 DIAGNOSIS — I10 Essential (primary) hypertension: Secondary | ICD-10-CM | POA: Insufficient documentation

## 2021-02-21 DIAGNOSIS — E119 Type 2 diabetes mellitus without complications: Secondary | ICD-10-CM | POA: Diagnosis not present

## 2021-02-21 DIAGNOSIS — Z7901 Long term (current) use of anticoagulants: Secondary | ICD-10-CM | POA: Insufficient documentation

## 2021-02-21 DIAGNOSIS — Z86718 Personal history of other venous thrombosis and embolism: Secondary | ICD-10-CM | POA: Diagnosis not present

## 2021-02-21 DIAGNOSIS — D649 Anemia, unspecified: Secondary | ICD-10-CM | POA: Diagnosis not present

## 2021-02-21 DIAGNOSIS — Z7984 Long term (current) use of oral hypoglycemic drugs: Secondary | ICD-10-CM | POA: Insufficient documentation

## 2021-02-21 DIAGNOSIS — D473 Essential (hemorrhagic) thrombocythemia: Secondary | ICD-10-CM | POA: Diagnosis not present

## 2021-02-21 DIAGNOSIS — R7989 Other specified abnormal findings of blood chemistry: Secondary | ICD-10-CM | POA: Diagnosis not present

## 2021-02-21 DIAGNOSIS — Z79899 Other long term (current) drug therapy: Secondary | ICD-10-CM | POA: Diagnosis not present

## 2021-02-21 LAB — CMP (CANCER CENTER ONLY)
ALT: <6 U/L (ref 0–44)
AST: 16 U/L (ref 15–41)
Albumin: 3.7 g/dL (ref 3.5–5.0)
Alkaline Phosphatase: 42 U/L (ref 38–126)
Anion gap: 8 (ref 5–15)
BUN: 16 mg/dL (ref 8–23)
CO2: 27 mmol/L (ref 22–32)
Calcium: 8.8 mg/dL — ABNORMAL LOW (ref 8.9–10.3)
Chloride: 107 mmol/L (ref 98–111)
Creatinine: 0.79 mg/dL (ref 0.44–1.00)
GFR, Estimated: >60 mL/min (ref >60)
Glucose, Bld: 97 mg/dL (ref 70–99)
Potassium: 4.1 mmol/L (ref 3.5–5.1)
Sodium: 142 mmol/L (ref 135–145)
Total Bilirubin: 0.4 mg/dL (ref 0.3–1.2)
Total Protein: 5.9 g/dL — ABNORMAL LOW (ref 6.5–8.1)

## 2021-02-21 LAB — CBC WITH DIFFERENTIAL (CANCER CENTER ONLY)
Abs Immature Granulocytes: 0.01 10*3/uL (ref 0.00–0.07)
Basophils Absolute: 0 10*3/uL (ref 0.0–0.1)
Basophils Relative: 1 %
Eosinophils Absolute: 0 10*3/uL (ref 0.0–0.5)
Eosinophils Relative: 2 %
HCT: 29.4 % — ABNORMAL LOW (ref 36.0–46.0)
Hemoglobin: 9.5 g/dL — ABNORMAL LOW (ref 12.0–15.0)
Immature Granulocytes: 1 %
Lymphocytes Relative: 30 %
Lymphs Abs: 0.7 10*3/uL (ref 0.7–4.0)
MCH: 35.7 pg — ABNORMAL HIGH (ref 26.0–34.0)
MCHC: 32.3 g/dL (ref 30.0–36.0)
MCV: 110.5 fL — ABNORMAL HIGH (ref 80.0–100.0)
Monocytes Absolute: 0.2 10*3/uL (ref 0.1–1.0)
Monocytes Relative: 8 %
Neutro Abs: 1.3 10*3/uL — ABNORMAL LOW (ref 1.7–7.7)
Neutrophils Relative %: 58 %
Platelet Count: 415 10*3/uL — ABNORMAL HIGH (ref 150–400)
RBC: 2.66 MIL/uL — ABNORMAL LOW (ref 3.87–5.11)
RDW: 16.2 % — ABNORMAL HIGH (ref 11.5–15.5)
WBC Count: 2.2 10*3/uL — ABNORMAL LOW (ref 4.0–10.5)
nRBC: 0 % (ref 0.0–0.2)

## 2021-02-21 LAB — LACTATE DEHYDROGENASE: LDH: 253 U/L — ABNORMAL HIGH (ref 98–192)

## 2021-02-21 NOTE — Progress Notes (Signed)
[facility] [phone]   [phone]  OFFICE PROGRESS NOTE  [name], MD [place] [address]  DIAGNOSIS: Essential thrombocythemia with positive JAK2 mutation V617F diagnosed in October 2020.  PRIOR THERAPY: None  CURRENT THERAPY: Hydroxyurea 500 mg p.o. daily.   INTERVAL HISTORY: [name] [name] 75 y.o. female returns to the clinic today for follow-up visit.  The patient is feeling fine today with no concerning complaints.  She has no significant fatigue or weakness.  She denied having any bleeding, bruises or ecchymosis.  She has no nausea, vomiting, diarrhea or constipation she has no chest pain, shortness of breath, cough or hemoptysis.  She continues to tolerate her treatment with Hydrea fairly well.    MEDICAL HISTORY: Past Medical History:  Diagnosis Date  . Anemia   . Coronary artery disease    s/p stent to LAD and LCx  . DM II (diabetes mellitus, type II), controlled ([facility])   . DVT (deep venous thrombosis) ([facility])   . Elevated LFTs   . Hyperlipidemia   . Hypertension   . Myocardial infarction ([facility])    7 yrs ago    ALLERGIES:  is allergic to ampicillin, avelox [moxifloxacin hcl in nacl], bactrim [sulfamethoxazole-trimethoprim], ibuprofen, penicillins, codeine, and latex.  MEDICATIONS:  Current Outpatient Medications  Medication Sig Dispense Refill  . fenofibrate micronized (LOFIBRA) 200 MG capsule TAKE ONE CAPSULE BY MOUTH ONCE DAILY BEFORE BREAKFAST 90 capsule 2  . ferrous sulfate 325 (65 FE) MG EC tablet Take 325 mg by mouth daily.    . Fish Oil-Cholecalciferol (FISH OIL + D3) 1000-1000 MG-UNIT CAPS Take 1 tablet by mouth 3 (three) times daily.    . hydroxyurea (HYDREA) 500 MG capsule TAKE 1 CAPSULE BY MOUTH ONCE DAILY WITH FOOD TO  MINIMIZE  GI  SIDE  EFFECTS. 30 capsule 2  . metFORMIN (GLUCOPHAGE) 500 MG tablet Take 500 mg by mouth 2 (two) times daily with a meal.    . nitroGLYCERIN (NITROSTAT) 0.4 MG  SL tablet Place 1 tablet (0.4 mg total) under the tongue every 5 (five) minutes as needed. For chest pain. 25 tablet 0  . OVER THE COUNTER MEDICATION Take 25 mg by mouth daily. ZINC    . prochlorperazine (COMPAZINE) 10 MG tablet Take 1 tablet (10 mg total) by mouth every 6 (six) hours as needed for nausea or vomiting. 30 tablet 2  . rivaroxaban (XARELTO) 20 MG TABS tablet Take 20 mg by mouth daily with supper.    . rosuvastatin (CRESTOR) 20 MG tablet Take 10 mg by mouth daily.    . Zinc 25 MG TABS Take 1 tablet by mouth 2 (two) times daily.     No current facility-administered medications for this visit.    SURGICAL HISTORY:  Past Surgical History:  Procedure Laterality Date  . CATARACT EXTRACTION    . CESAREAN SECTION  1983  . CORONARY ANGIOPLASTY WITH STENT PLACEMENT  04/26/2003   NORMAL. EF 65%  . I & D EXTREMITY  06/22/2012   Procedure: IRRIGATION AND DEBRIDEMENT EXTREMITY;  Surgeon: Tennis Must, MD;  Location: [facility];  Service: Orthopedics;  Laterality: Left;  . I & D EXTREMITY  06/29/2012   Procedure: IRRIGATION AND DEBRIDEMENT EXTREMITY;  Surgeon: Tennis Must, MD;  Location: [facility];  Service: Orthopedics;  Laterality: Left;  . OPEN REDUCTION INTERNAL FIXATION (ORIF) DISTAL RADIAL FRACTURE Right 02/04/2018   Procedure: OPEN REDUCTION INTERNAL FIXATION (ORIF) RIGHT DISTAL RADIAL FRACTURE;  Surgeon: Leanora Cover, MD;  Location: [facility];  Service: Orthopedics;  Laterality: Right;    REVIEW OF SYSTEMS:  A comprehensive review of systems was negative.   PHYSICAL EXAMINATION: General appearance: alert, cooperative and no distress Head: Normocephalic, without obvious abnormality, atraumatic Neck: no adenopathy, no JVD, supple, symmetrical, trachea midline and thyroid not enlarged, symmetric, no tenderness/mass/nodules Lymph nodes: Cervical, supraclavicular, and axillary nodes normal. Resp: clear to auscultation bilaterally Back: symmetric, no curvature. ROM normal. No  CVA tenderness. Cardio: regular rate and rhythm, S1, S2 normal, no murmur, click, rub or gallop GI: soft, non-tender; bowel sounds normal; no masses,  no organomegaly Extremities: extremities normal, atraumatic, no cyanosis or edema  ECOG PERFORMANCE STATUS: 1 - Symptomatic but completely ambulatory  Blood pressure (!) 128/49, pulse 69, temperature 98.5 F (36.9 C), temperature source Tympanic, resp. rate 18, height 5\' 2"  (1.575 m), weight 125 lb 8 oz (56.9 kg), SpO2 99 %.  LABORATORY DATA: Lab Results  Component Value Date   WBC 2.2 (L) 02/21/2021   HGB 9.5 (L) 02/21/2021   HCT 29.4 (L) 02/21/2021   MCV 110.5 (H) 02/21/2021   PLT 415 (H) 02/21/2021      Chemistry      Component Value Date/Time   NA 142 02/21/2021 1106   NA 141 01/29/2021 1107   K 4.1 02/21/2021 1106   CL 107 02/21/2021 1106   CO2 27 02/21/2021 1106   BUN 16 02/21/2021 1106   BUN 17 01/29/2021 1107   CREATININE 0.79 02/21/2021 1106      Component Value Date/Time   CALCIUM 8.8 (L) 02/21/2021 1106   ALKPHOS 42 02/21/2021 1106   AST 16 02/21/2021 1106   ALT <6 02/21/2021 1106   BILITOT 0.4 02/21/2021 1106       RADIOGRAPHIC STUDIES: MM 3D SCREEN BREAST BILATERAL  Result Date: 02/18/2021 CLINICAL DATA:  Screening. EXAM: DIGITAL SCREENING BILATERAL MAMMOGRAM WITH TOMOSYNTHESIS AND CAD TECHNIQUE: Bilateral screening digital craniocaudal and mediolateral oblique mammograms were obtained. Bilateral screening digital breast tomosynthesis was performed. The images were evaluated with computer-aided detection. COMPARISON:  Previous exam(s). ACR Breast Density Category c: The breast tissue is heterogeneously dense, which may obscure small masses. FINDINGS: There are no findings suspicious for malignancy. The images were evaluated with computer-aided detection. IMPRESSION: No mammographic evidence of malignancy. A result letter of this screening mammogram will be mailed directly to the patient. RECOMMENDATION:  Screening mammogram in one year. (Code:SM-B-01Y) BI-RADS CATEGORY  1: Negative. Electronically Signed   By: Claudie Revering M.D.   On: 02/18/2021 13:48    ASSESSMENT AND PLAN: This is a very pleasant 75 years old white female recently diagnosed with essential thrombocythemia with positive JAK2 mutation.  The patient also has anemia and leukocytosis secondary to the myeloproliferative disorder. The patient is currently on treatment with hydroxyurea initially at 500 mg increased to 1000 mg but the patient could not tolerate the higher dose well and we switched her back to 500 mg p.o. daily.  The patient continues to tolerate the hydroxyurea 500 mg p.o. daily fairly well. CBC today showed low white blood count of 2.2 and platelets count of 415,000.  Her hemoglobin is a little bit down compared to the previous lab work with hemoglobin of 9.5. I recommended for her to continue her current treatment with hydroxyurea with the same dose. I will see her back for follow-up visit for follow-up visit in 1 months with repeat blood work. She was advised to call immediately if she  has any other concerning symptoms in the interval. The patient voices understanding of current disease status and treatment options and is in agreement with the current care plan.  All questions were answered. The patient knows to call the clinic with any problems, questions or concerns. We can certainly see the patient much sooner if necessary.  Disclaimer: This note was dictated with voice recognition software. Similar sounding words can inadvertently be transcribed and may not be corrected upon review.

## 2021-02-22 ENCOUNTER — Telehealth: Payer: Self-pay | Admitting: Internal Medicine

## 2021-02-22 NOTE — Telephone Encounter (Signed)
Scheduled per los. Called and spoke with patients daughter, dawn. Confirmed appt

## 2021-02-26 DIAGNOSIS — Z86718 Personal history of other venous thrombosis and embolism: Secondary | ICD-10-CM | POA: Diagnosis not present

## 2021-02-26 DIAGNOSIS — Z7901 Long term (current) use of anticoagulants: Secondary | ICD-10-CM | POA: Diagnosis not present

## 2021-03-06 DIAGNOSIS — Z7901 Long term (current) use of anticoagulants: Secondary | ICD-10-CM | POA: Diagnosis not present

## 2021-03-06 DIAGNOSIS — Z86718 Personal history of other venous thrombosis and embolism: Secondary | ICD-10-CM | POA: Diagnosis not present

## 2021-03-13 DIAGNOSIS — Z86718 Personal history of other venous thrombosis and embolism: Secondary | ICD-10-CM | POA: Diagnosis not present

## 2021-03-13 DIAGNOSIS — Z7901 Long term (current) use of anticoagulants: Secondary | ICD-10-CM | POA: Diagnosis not present

## 2021-03-21 ENCOUNTER — Encounter: Payer: Self-pay | Admitting: Internal Medicine

## 2021-03-21 ENCOUNTER — Inpatient Hospital Stay: Payer: Medicare Other | Attending: Internal Medicine | Admitting: Internal Medicine

## 2021-03-21 ENCOUNTER — Other Ambulatory Visit: Payer: Self-pay

## 2021-03-21 ENCOUNTER — Inpatient Hospital Stay: Payer: Medicare Other

## 2021-03-21 VITALS — BP 124/47 | HR 66 | Temp 98.5°F | Resp 18 | Ht 62.0 in | Wt 124.1 lb

## 2021-03-21 DIAGNOSIS — Z886 Allergy status to analgesic agent status: Secondary | ICD-10-CM | POA: Insufficient documentation

## 2021-03-21 DIAGNOSIS — Z885 Allergy status to narcotic agent status: Secondary | ICD-10-CM | POA: Diagnosis not present

## 2021-03-21 DIAGNOSIS — Z7901 Long term (current) use of anticoagulants: Secondary | ICD-10-CM | POA: Diagnosis not present

## 2021-03-21 DIAGNOSIS — D75839 Thrombocytosis, unspecified: Secondary | ICD-10-CM | POA: Insufficient documentation

## 2021-03-21 DIAGNOSIS — C946 Myelodysplastic disease, not classified: Secondary | ICD-10-CM | POA: Insufficient documentation

## 2021-03-21 DIAGNOSIS — D649 Anemia, unspecified: Secondary | ICD-10-CM | POA: Diagnosis not present

## 2021-03-21 DIAGNOSIS — D473 Essential (hemorrhagic) thrombocythemia: Secondary | ICD-10-CM

## 2021-03-21 DIAGNOSIS — Z79899 Other long term (current) drug therapy: Secondary | ICD-10-CM | POA: Insufficient documentation

## 2021-03-21 DIAGNOSIS — Z880 Allergy status to penicillin: Secondary | ICD-10-CM | POA: Insufficient documentation

## 2021-03-21 DIAGNOSIS — Z881 Allergy status to other antibiotic agents status: Secondary | ICD-10-CM | POA: Diagnosis not present

## 2021-03-21 DIAGNOSIS — I2510 Atherosclerotic heart disease of native coronary artery without angina pectoris: Secondary | ICD-10-CM

## 2021-03-21 DIAGNOSIS — Z5111 Encounter for antineoplastic chemotherapy: Secondary | ICD-10-CM

## 2021-03-21 LAB — CBC WITH DIFFERENTIAL (CANCER CENTER ONLY)
Abs Immature Granulocytes: 0.01 10*3/uL (ref 0.00–0.07)
Basophils Absolute: 0 10*3/uL (ref 0.0–0.1)
Basophils Relative: 0 %
Eosinophils Absolute: 0 10*3/uL (ref 0.0–0.5)
Eosinophils Relative: 1 %
HCT: 28.4 % — ABNORMAL LOW (ref 36.0–46.0)
Hemoglobin: 9.5 g/dL — ABNORMAL LOW (ref 12.0–15.0)
Immature Granulocytes: 0 %
Lymphocytes Relative: 27 %
Lymphs Abs: 0.7 10*3/uL (ref 0.7–4.0)
MCH: 36.5 pg — ABNORMAL HIGH (ref 26.0–34.0)
MCHC: 33.5 g/dL (ref 30.0–36.0)
MCV: 109.2 fL — ABNORMAL HIGH (ref 80.0–100.0)
Monocytes Absolute: 0.2 10*3/uL (ref 0.1–1.0)
Monocytes Relative: 7 %
Neutro Abs: 1.6 10*3/uL — ABNORMAL LOW (ref 1.7–7.7)
Neutrophils Relative %: 65 %
Platelet Count: 453 10*3/uL — ABNORMAL HIGH (ref 150–400)
RBC: 2.6 MIL/uL — ABNORMAL LOW (ref 3.87–5.11)
RDW: 17.3 % — ABNORMAL HIGH (ref 11.5–15.5)
WBC Count: 2.4 10*3/uL — ABNORMAL LOW (ref 4.0–10.5)
nRBC: 0 % (ref 0.0–0.2)

## 2021-03-21 LAB — IRON AND TIBC
Iron: 64 ug/dL (ref 41–142)
Saturation Ratios: 21 % (ref 21–57)
TIBC: 311 ug/dL (ref 236–444)
UIBC: 247 ug/dL (ref 120–384)

## 2021-03-21 LAB — FERRITIN: Ferritin: 250 ng/mL (ref 11–307)

## 2021-03-21 MED ORDER — HYDROXYUREA 500 MG PO CAPS: ORAL_CAPSULE | ORAL | 2 refills | Status: DC

## 2021-03-21 NOTE — Progress Notes (Signed)
[facility] [phone]   [phone]  OFFICE PROGRESS NOTE  [name], MD [place] [address]  DIAGNOSIS: Essential thrombocythemia with positive JAK2 mutation V617F diagnosed in October 2020.  PRIOR THERAPY: None  CURRENT THERAPY: Hydroxyurea 500 mg p.o. daily.   INTERVAL HISTORY: [name] [name] 75 y.o. female returns to the clinic today for follow-up visit accompanied by [name].  The patient is feeling fine today with no concerning complaints.  She denied having any fatigue or weakness.  She has some mouth sores especially in the morning.  She is using Biotene.  She denied having any current chest pain, shortness of breath, cough or hemoptysis.  She denied having any fever or chills.  She has no nausea, vomiting, diarrhea or constipation.  She denied having any headache or visual changes.  She continues to tolerate her treatment with hydroxyurea fairly well.  The patient is here today for evaluation with repeat blood work.     MEDICAL HISTORY: Past Medical History:  Diagnosis Date  . Anemia   . Coronary artery disease    s/p stent to LAD and LCx  . DM II (diabetes mellitus, type II), controlled ([facility])   . DVT (deep venous thrombosis) ([facility])   . Elevated LFTs   . Hyperlipidemia   . Hypertension   . Myocardial infarction ([facility])    7 yrs ago    ALLERGIES:  is allergic to ampicillin, avelox [moxifloxacin hcl in nacl], bactrim [sulfamethoxazole-trimethoprim], ibuprofen, penicillins, codeine, and latex.  MEDICATIONS:  Current Outpatient Medications  Medication Sig Dispense Refill  . fenofibrate micronized (LOFIBRA) 200 MG capsule TAKE ONE CAPSULE BY MOUTH ONCE DAILY BEFORE BREAKFAST 90 capsule 2  . ferrous sulfate 325 (65 FE) MG EC tablet Take 325 mg by mouth daily.    . Fish Oil-Cholecalciferol (FISH OIL + D3) 1000-1000 MG-UNIT CAPS Take 1 tablet by mouth 3 (three) times daily.    . hydroxyurea (HYDREA) 500  MG capsule TAKE 1 CAPSULE BY MOUTH ONCE DAILY WITH FOOD TO  MINIMIZE  GI  SIDE  EFFECTS. 30 capsule 2  . metFORMIN (GLUCOPHAGE) 500 MG tablet Take 500 mg by mouth 2 (two) times daily with a meal.    . nitroGLYCERIN (NITROSTAT) 0.4 MG SL tablet Place 1 tablet (0.4 mg total) under the tongue every 5 (five) minutes as needed. For chest pain. 25 tablet 0  . OVER THE COUNTER MEDICATION Take 25 mg by mouth daily. ZINC    . prochlorperazine (COMPAZINE) 10 MG tablet Take 1 tablet (10 mg total) by mouth every 6 (six) hours as needed for nausea or vomiting. 30 tablet 2  . rivaroxaban (XARELTO) 20 MG TABS tablet Take 20 mg by mouth daily with supper.    . rosuvastatin (CRESTOR) 20 MG tablet Take 10 mg by mouth daily.    . Zinc 25 MG TABS Take 1 tablet by mouth 2 (two) times daily.     No current facility-administered medications for this visit.    SURGICAL HISTORY:  Past Surgical History:  Procedure Laterality Date  . CATARACT EXTRACTION    . CESAREAN SECTION  1983  . CORONARY ANGIOPLASTY WITH STENT PLACEMENT  04/26/2003   NORMAL. EF 65%  . I & D EXTREMITY  06/22/2012   Procedure: IRRIGATION AND DEBRIDEMENT EXTREMITY;  Surgeon: Tennis Must, MD;  Location: [facility];  Service: Orthopedics;  Laterality: Left;  . I & D EXTREMITY  06/29/2012   Procedure:  IRRIGATION AND DEBRIDEMENT EXTREMITY;  Surgeon: Tennis Must, MD;  Location: [facility];  Service: Orthopedics;  Laterality: Left;  . OPEN REDUCTION INTERNAL FIXATION (ORIF) DISTAL RADIAL FRACTURE Right 02/04/2018   Procedure: OPEN REDUCTION INTERNAL FIXATION (ORIF) RIGHT DISTAL RADIAL FRACTURE;  Surgeon: Leanora Cover, MD;  Location: [facility];  Service: Orthopedics;  Laterality: Right;    REVIEW OF SYSTEMS:  A comprehensive review of systems was negative.   PHYSICAL EXAMINATION: General appearance: alert, cooperative and no distress Head: Normocephalic, without obvious abnormality, atraumatic Neck: no adenopathy, no JVD, supple, symmetrical,  trachea midline and thyroid not enlarged, symmetric, no tenderness/mass/nodules Lymph nodes: Cervical, supraclavicular, and axillary nodes normal. Resp: clear to auscultation bilaterally Back: symmetric, no curvature. ROM normal. No CVA tenderness. Cardio: regular rate and rhythm, S1, S2 normal, no murmur, click, rub or gallop GI: soft, non-tender; bowel sounds normal; no masses,  no organomegaly Extremities: extremities normal, atraumatic, no cyanosis or edema  ECOG PERFORMANCE STATUS: 1 - Symptomatic but completely ambulatory  Blood pressure (!) 124/47, pulse 66, temperature 98.5 F (36.9 C), temperature source Tympanic, resp. rate 18, height 5\' 2"  (1.575 m), weight 124 lb 1.6 oz (56.3 kg), SpO2 99 %.  LABORATORY DATA: Lab Results  Component Value Date   WBC 2.4 (L) 03/21/2021   HGB 9.5 (L) 03/21/2021   HCT 28.4 (L) 03/21/2021   MCV 109.2 (H) 03/21/2021   PLT 453 (H) 03/21/2021      Chemistry      Component Value Date/Time   NA 142 02/21/2021 1106   NA 141 01/29/2021 1107   K 4.1 02/21/2021 1106   CL 107 02/21/2021 1106   CO2 27 02/21/2021 1106   BUN 16 02/21/2021 1106   BUN 17 01/29/2021 1107   CREATININE 0.79 02/21/2021 1106      Component Value Date/Time   CALCIUM 8.8 (L) 02/21/2021 1106   ALKPHOS 42 02/21/2021 1106   AST 16 02/21/2021 1106   ALT <6 02/21/2021 1106   BILITOT 0.4 02/21/2021 1106       RADIOGRAPHIC STUDIES: No results found.  ASSESSMENT AND PLAN: This is a very pleasant 75 years old white female recently diagnosed with essential thrombocythemia with positive JAK2 mutation.  The patient also has anemia and leukocytosis secondary to the myeloproliferative disorder. The patient is currently on treatment with hydroxyurea initially at 500 mg increased to 1000 mg but the patient could not tolerate the higher dose well and we switched her back to 500 mg p.o. daily.  She continues to tolerate her treatment well with no concerning adverse effects. CBC  today showed a stable white blood count at 2.4 with a stable hemoglobin 9.5 and slightly increased platelets count to 453,000. I recommended for the patient to continue her current treatment with hydroxyurea with the same dose.  She is planning to resume her treatment with Xarelto for the heart disease and deep venous thrombosis. I will see her back for follow-up visit in 1 months for evaluation with repeat CBC and LDH. She was advised to call immediately if she has any concerning symptoms in the interval The patient voices understanding of current disease status and treatment options and is in agreement with the current care plan.  All questions were answered. The patient knows to call the clinic with any problems, questions or concerns. We can certainly see the patient much sooner if necessary.  Disclaimer: This note was dictated with voice recognition software. Similar sounding words can inadvertently be transcribed and may  not be corrected upon review.

## 2021-04-03 DIAGNOSIS — Z7901 Long term (current) use of anticoagulants: Secondary | ICD-10-CM | POA: Diagnosis not present

## 2021-04-03 DIAGNOSIS — Z86718 Personal history of other venous thrombosis and embolism: Secondary | ICD-10-CM | POA: Diagnosis not present

## 2021-04-24 ENCOUNTER — Other Ambulatory Visit: Payer: Self-pay

## 2021-04-24 ENCOUNTER — Inpatient Hospital Stay (HOSPITAL_BASED_OUTPATIENT_CLINIC_OR_DEPARTMENT_OTHER): Payer: Medicare Other | Admitting: Internal Medicine

## 2021-04-24 ENCOUNTER — Inpatient Hospital Stay: Payer: Medicare Other | Attending: Internal Medicine

## 2021-04-24 VITALS — BP 133/57 | HR 66 | Temp 97.8°F | Resp 18 | Ht 62.0 in | Wt 122.2 lb

## 2021-04-24 DIAGNOSIS — D72829 Elevated white blood cell count, unspecified: Secondary | ICD-10-CM | POA: Insufficient documentation

## 2021-04-24 DIAGNOSIS — Z79899 Other long term (current) drug therapy: Secondary | ICD-10-CM | POA: Diagnosis not present

## 2021-04-24 DIAGNOSIS — I2510 Atherosclerotic heart disease of native coronary artery without angina pectoris: Secondary | ICD-10-CM | POA: Insufficient documentation

## 2021-04-24 DIAGNOSIS — D649 Anemia, unspecified: Secondary | ICD-10-CM | POA: Insufficient documentation

## 2021-04-24 DIAGNOSIS — I252 Old myocardial infarction: Secondary | ICD-10-CM | POA: Insufficient documentation

## 2021-04-24 DIAGNOSIS — Z86718 Personal history of other venous thrombosis and embolism: Secondary | ICD-10-CM | POA: Diagnosis not present

## 2021-04-24 DIAGNOSIS — D473 Essential (hemorrhagic) thrombocythemia: Secondary | ICD-10-CM

## 2021-04-24 DIAGNOSIS — Z7901 Long term (current) use of anticoagulants: Secondary | ICD-10-CM | POA: Diagnosis not present

## 2021-04-24 DIAGNOSIS — Z881 Allergy status to other antibiotic agents status: Secondary | ICD-10-CM | POA: Insufficient documentation

## 2021-04-24 DIAGNOSIS — Z885 Allergy status to narcotic agent status: Secondary | ICD-10-CM | POA: Diagnosis not present

## 2021-04-24 DIAGNOSIS — Z886 Allergy status to analgesic agent status: Secondary | ICD-10-CM | POA: Insufficient documentation

## 2021-04-24 DIAGNOSIS — Z880 Allergy status to penicillin: Secondary | ICD-10-CM | POA: Insufficient documentation

## 2021-04-24 DIAGNOSIS — C946 Myelodysplastic disease, not classified: Secondary | ICD-10-CM | POA: Diagnosis not present

## 2021-04-24 DIAGNOSIS — R5383 Other fatigue: Secondary | ICD-10-CM | POA: Insufficient documentation

## 2021-04-24 LAB — CBC WITH DIFFERENTIAL (CANCER CENTER ONLY)
Abs Immature Granulocytes: 0 10*3/uL (ref 0.00–0.07)
Band Neutrophils: 2 %
Basophils Absolute: 0 10*3/uL (ref 0.0–0.1)
Basophils Relative: 0 %
Eosinophils Absolute: 0 10*3/uL (ref 0.0–0.5)
Eosinophils Relative: 0 %
HCT: 30.1 % — ABNORMAL LOW (ref 36.0–46.0)
Hemoglobin: 9.9 g/dL — ABNORMAL LOW (ref 12.0–15.0)
Lymphocytes Relative: 43 %
Lymphs Abs: 1 10*3/uL (ref 0.7–4.0)
MCH: 37.5 pg — ABNORMAL HIGH (ref 26.0–34.0)
MCHC: 32.9 g/dL (ref 30.0–36.0)
MCV: 114 fL — ABNORMAL HIGH (ref 80.0–100.0)
Monocytes Absolute: 0.1 10*3/uL (ref 0.1–1.0)
Monocytes Relative: 6 %
Neutro Abs: 1.2 10*3/uL — ABNORMAL LOW (ref 1.7–7.7)
Neutrophils Relative %: 49 %
Platelet Count: 444 10*3/uL — ABNORMAL HIGH (ref 150–400)
RBC: 2.64 MIL/uL — ABNORMAL LOW (ref 3.87–5.11)
RDW: 16.5 % — ABNORMAL HIGH (ref 11.5–15.5)
WBC Count: 2.3 10*3/uL — ABNORMAL LOW (ref 4.0–10.5)
nRBC: 0 % (ref 0.0–0.2)

## 2021-04-24 LAB — LACTATE DEHYDROGENASE: LDH: 236 U/L — ABNORMAL HIGH (ref 98–192)

## 2021-04-24 NOTE — Progress Notes (Signed)
[facility] Telephone:(336) [phone]   [phone]) [phone]  OFFICE PROGRESS NOTE  [name], MD [place] [address]  DIAGNOSIS: Essential thrombocythemia with positive JAK2 mutation V617F diagnosed in October 2020.  PRIOR THERAPY: None  CURRENT THERAPY: Hydroxyurea 500 mg p.o. daily.   INTERVAL HISTORY: [name] [name] 75 y.o. female returns to the clinic today for follow-up visit accompanied by [name].  The patient is feeling fine today with no concerning complaints except for mild fatigue.  She has no concerning adverse effects with the hydroxyurea.  She denied having any chest pain, shortness of breath, cough or hemoptysis.  She denied having any fever or chills.  She has no nausea, vomiting, diarrhea or constipation.  She has no headache or visual changes.  She is here today for evaluation and repeat blood work.     MEDICAL HISTORY: Past Medical History:  Diagnosis Date   Anemia    Coronary artery disease    s/p stent to LAD and LCx   DM II (diabetes mellitus, type II), controlled (HCC)    DVT (deep venous thrombosis) (HCC)    Elevated LFTs    Hyperlipidemia    Hypertension    Myocardial infarction ([facility])    7 yrs ago    ALLERGIES:  is allergic to ampicillin, avelox [moxifloxacin hcl in nacl], bactrim [sulfamethoxazole-trimethoprim], ibuprofen, penicillins, codeine, and latex.  MEDICATIONS:  Current Outpatient Medications  Medication Sig Dispense Refill   fenofibrate micronized (LOFIBRA) 200 MG capsule TAKE ONE CAPSULE BY MOUTH ONCE DAILY BEFORE BREAKFAST 90 capsule 2   ferrous sulfate 325 (65 FE) MG EC tablet Take 325 mg by mouth daily.     Fish Oil-Cholecalciferol (FISH OIL + D3) 1000-1000 MG-UNIT CAPS Take 1 tablet by mouth 3 (three) times daily.     hydroxyurea (HYDREA) 500 MG capsule TAKE 1 CAPSULE BY MOUTH ONCE DAILY WITH FOOD TO  MINIMIZE  GI  SIDE  EFFECTS. 30 capsule 2   metFORMIN (GLUCOPHAGE) 500 MG tablet Take  500 mg by mouth 2 (two) times daily with a meal.     nitroGLYCERIN (NITROSTAT) 0.4 MG SL tablet Place 1 tablet (0.4 mg total) under the tongue every 5 (five) minutes as needed. For chest pain. 25 tablet 0   OVER THE COUNTER MEDICATION Take 25 mg by mouth daily. ZINC     prochlorperazine (COMPAZINE) 10 MG tablet Take 1 tablet (10 mg total) by mouth every 6 (six) hours as needed for nausea or vomiting. 30 tablet 2   rivaroxaban (XARELTO) 20 MG TABS tablet Take 20 mg by mouth daily with supper.     rosuvastatin (CRESTOR) 20 MG tablet Take 10 mg by mouth daily.     Zinc 25 MG TABS Take 1 tablet by mouth 2 (two) times daily.     warfarin (COUMADIN) 5 MG tablet Take 5 mg by mouth daily. (Patient not taking: Reported on 04/24/2021)     No current facility-administered medications for this visit.    SURGICAL HISTORY:  Past Surgical History:  Procedure Laterality Date   CATARACT EXTRACTION     CESAREAN SECTION  1983   CORONARY ANGIOPLASTY WITH STENT PLACEMENT  04/26/2003   NORMAL. EF 65%   I & D EXTREMITY  06/22/2012   Procedure: IRRIGATION AND DEBRIDEMENT EXTREMITY;  Surgeon: Tennis Must, MD;  Location: [facility];  Service: Orthopedics;  Laterality: Left;   I & D EXTREMITY  06/29/2012   Procedure: IRRIGATION AND  DEBRIDEMENT EXTREMITY;  Surgeon: Tennis Must, MD;  Location: [facility];  Service: Orthopedics;  Laterality: Left;   OPEN REDUCTION INTERNAL FIXATION (ORIF) DISTAL RADIAL FRACTURE Right 02/04/2018   Procedure: OPEN REDUCTION INTERNAL FIXATION (ORIF) RIGHT DISTAL RADIAL FRACTURE;  Surgeon: Leanora Cover, MD;  Location: [facility];  Service: Orthopedics;  Laterality: Right;    REVIEW OF SYSTEMS:  A comprehensive review of systems was negative except for: Constitutional: positive for fatigue   PHYSICAL EXAMINATION: General appearance: alert, cooperative, and no distress Head: Normocephalic, without obvious abnormality, atraumatic Neck: no adenopathy, no JVD, supple, symmetrical,  trachea midline, and thyroid not enlarged, symmetric, no tenderness/mass/nodules Lymph nodes: Cervical, supraclavicular, and axillary nodes normal. Resp: clear to auscultation bilaterally Back: symmetric, no curvature. ROM normal. No CVA tenderness. Cardio: regular rate and rhythm, S1, S2 normal, no murmur, click, rub or gallop GI: soft, non-tender; bowel sounds normal; no masses,  no organomegaly Extremities: extremities normal, atraumatic, no cyanosis or edema  ECOG PERFORMANCE STATUS: 1 - Symptomatic but completely ambulatory  Blood pressure (!) 133/57, pulse 66, temperature 97.8 F (36.6 C), resp. rate 18, height 5\' 2"  (1.575 m), weight 122 lb 3.2 oz (55.4 kg), SpO2 100 %.  LABORATORY DATA: Lab Results  Component Value Date   WBC 2.3 (L) 04/24/2021   HGB 9.9 (L) 04/24/2021   HCT 30.1 (L) 04/24/2021   MCV 114.0 (H) 04/24/2021   PLT 444 (H) 04/24/2021      Chemistry      Component Value Date/Time   NA 142 02/21/2021 1106   NA 141 01/29/2021 1107   K 4.1 02/21/2021 1106   CL 107 02/21/2021 1106   CO2 27 02/21/2021 1106   BUN 16 02/21/2021 1106   BUN 17 01/29/2021 1107   CREATININE 0.79 02/21/2021 1106      Component Value Date/Time   CALCIUM 8.8 (L) 02/21/2021 1106   ALKPHOS 42 02/21/2021 1106   AST 16 02/21/2021 1106   ALT <6 02/21/2021 1106   BILITOT 0.4 02/21/2021 1106       RADIOGRAPHIC STUDIES: No results found.  ASSESSMENT AND PLAN: This is a very pleasant 75 years old white female recently diagnosed with essential thrombocythemia with positive JAK2 mutation.  The patient also has anemia and leukocytosis secondary to the myeloproliferative disorder. The patient is currently on treatment with hydroxyurea initially at 500 mg increased to 1000 mg but the patient could not tolerate the higher dose well and we switched her back to 500 mg p.o. daily.  The patient continues to tolerate her treatment with hydroxyurea fairly well. Repeat CBC today showed a stable but  decreased leukocyte, hemoglobin and hematocrit and slightly elevated platelets count. I recommended for the patient to continue with her current dose of hydroxyurea 500 mg p.o. daily. I will see her back for follow-up visit in 6 weeks for evaluation and repeat blood work. She was advised to call immediately if she has any other concerning symptoms in the interval. The patient voices understanding of current disease status and treatment options and is in agreement with the current care plan.  All questions were answered. The patient knows to call the clinic with any problems, questions or concerns. We can certainly see the patient much sooner if necessary.  Disclaimer: This note was dictated with voice recognition software. Similar sounding words can inadvertently be transcribed and may not be corrected upon review.

## 2021-04-25 DIAGNOSIS — E1165 Type 2 diabetes mellitus with hyperglycemia: Secondary | ICD-10-CM | POA: Diagnosis not present

## 2021-04-25 DIAGNOSIS — Z0000 Encounter for general adult medical examination without abnormal findings: Secondary | ICD-10-CM | POA: Diagnosis not present

## 2021-04-25 DIAGNOSIS — E785 Hyperlipidemia, unspecified: Secondary | ICD-10-CM | POA: Diagnosis not present

## 2021-04-29 DIAGNOSIS — M8580 Other specified disorders of bone density and structure, unspecified site: Secondary | ICD-10-CM | POA: Diagnosis not present

## 2021-04-29 DIAGNOSIS — D8989 Other specified disorders involving the immune mechanism, not elsewhere classified: Secondary | ICD-10-CM | POA: Diagnosis not present

## 2021-04-29 DIAGNOSIS — F329 Major depressive disorder, single episode, unspecified: Secondary | ICD-10-CM | POA: Diagnosis not present

## 2021-04-29 DIAGNOSIS — Z1331 Encounter for screening for depression: Secondary | ICD-10-CM | POA: Diagnosis not present

## 2021-04-29 DIAGNOSIS — D473 Essential (hemorrhagic) thrombocythemia: Secondary | ICD-10-CM | POA: Diagnosis not present

## 2021-04-29 DIAGNOSIS — E785 Hyperlipidemia, unspecified: Secondary | ICD-10-CM | POA: Diagnosis not present

## 2021-04-29 DIAGNOSIS — Z0000 Encounter for general adult medical examination without abnormal findings: Secondary | ICD-10-CM | POA: Diagnosis not present

## 2021-04-29 DIAGNOSIS — I252 Old myocardial infarction: Secondary | ICD-10-CM | POA: Diagnosis not present

## 2021-04-29 DIAGNOSIS — I119 Hypertensive heart disease without heart failure: Secondary | ICD-10-CM | POA: Diagnosis not present

## 2021-04-29 DIAGNOSIS — E1165 Type 2 diabetes mellitus with hyperglycemia: Secondary | ICD-10-CM | POA: Diagnosis not present

## 2021-04-29 DIAGNOSIS — Z1212 Encounter for screening for malignant neoplasm of rectum: Secondary | ICD-10-CM | POA: Diagnosis not present

## 2021-04-29 DIAGNOSIS — Z7901 Long term (current) use of anticoagulants: Secondary | ICD-10-CM | POA: Diagnosis not present

## 2021-04-29 DIAGNOSIS — I2510 Atherosclerotic heart disease of native coronary artery without angina pectoris: Secondary | ICD-10-CM | POA: Diagnosis not present

## 2021-04-29 DIAGNOSIS — R82998 Other abnormal findings in urine: Secondary | ICD-10-CM | POA: Diagnosis not present

## 2021-05-20 DIAGNOSIS — H26493 Other secondary cataract, bilateral: Secondary | ICD-10-CM | POA: Diagnosis not present

## 2021-05-20 DIAGNOSIS — Z961 Presence of intraocular lens: Secondary | ICD-10-CM | POA: Diagnosis not present

## 2021-05-20 DIAGNOSIS — H40013 Open angle with borderline findings, low risk, bilateral: Secondary | ICD-10-CM | POA: Diagnosis not present

## 2021-05-20 DIAGNOSIS — E119 Type 2 diabetes mellitus without complications: Secondary | ICD-10-CM | POA: Diagnosis not present

## 2021-06-05 ENCOUNTER — Other Ambulatory Visit: Payer: Self-pay | Admitting: Internal Medicine

## 2021-06-05 ENCOUNTER — Inpatient Hospital Stay: Payer: Medicare Other

## 2021-06-05 ENCOUNTER — Ambulatory Visit (HOSPITAL_COMMUNITY)
Admission: RE | Admit: 2021-06-05 | Discharge: 2021-06-05 | Disposition: A | Discharge status: Home / Self Care | Payer: Medicare Other | Source: Ambulatory Visit | Attending: Internal Medicine | Admitting: Internal Medicine

## 2021-06-05 ENCOUNTER — Other Ambulatory Visit: Payer: Self-pay

## 2021-06-05 ENCOUNTER — Inpatient Hospital Stay: Payer: Medicare Other | Attending: Internal Medicine | Admitting: Internal Medicine

## 2021-06-05 VITALS — BP 134/46 | HR 61 | Temp 98.8°F | Resp 18 | Ht 62.0 in | Wt 125.4 lb

## 2021-06-05 DIAGNOSIS — Z86718 Personal history of other venous thrombosis and embolism: Secondary | ICD-10-CM | POA: Diagnosis not present

## 2021-06-05 DIAGNOSIS — Z7901 Long term (current) use of anticoagulants: Secondary | ICD-10-CM | POA: Insufficient documentation

## 2021-06-05 DIAGNOSIS — Z79899 Other long term (current) drug therapy: Secondary | ICD-10-CM | POA: Insufficient documentation

## 2021-06-05 DIAGNOSIS — D473 Essential (hemorrhagic) thrombocythemia: Secondary | ICD-10-CM | POA: Insufficient documentation

## 2021-06-05 DIAGNOSIS — E785 Hyperlipidemia, unspecified: Secondary | ICD-10-CM | POA: Insufficient documentation

## 2021-06-05 DIAGNOSIS — I2510 Atherosclerotic heart disease of native coronary artery without angina pectoris: Secondary | ICD-10-CM | POA: Diagnosis not present

## 2021-06-05 DIAGNOSIS — R410 Disorientation, unspecified: Secondary | ICD-10-CM | POA: Insufficient documentation

## 2021-06-05 DIAGNOSIS — I252 Old myocardial infarction: Secondary | ICD-10-CM | POA: Diagnosis not present

## 2021-06-05 DIAGNOSIS — R413 Other amnesia: Secondary | ICD-10-CM

## 2021-06-05 DIAGNOSIS — Z7984 Long term (current) use of oral hypoglycemic drugs: Secondary | ICD-10-CM | POA: Insufficient documentation

## 2021-06-05 DIAGNOSIS — I10 Essential (primary) hypertension: Secondary | ICD-10-CM | POA: Insufficient documentation

## 2021-06-05 DIAGNOSIS — E119 Type 2 diabetes mellitus without complications: Secondary | ICD-10-CM | POA: Diagnosis not present

## 2021-06-05 LAB — CBC WITH DIFFERENTIAL (CANCER CENTER ONLY)
Abs Immature Granulocytes: 0.01 10*3/uL (ref 0.00–0.07)
Basophils Absolute: 0 10*3/uL (ref 0.0–0.1)
Basophils Relative: 0 %
Eosinophils Absolute: 0.1 10*3/uL (ref 0.0–0.5)
Eosinophils Relative: 2 %
HCT: 28 % — ABNORMAL LOW (ref 36.0–46.0)
Hemoglobin: 9.2 g/dL — ABNORMAL LOW (ref 12.0–15.0)
Immature Granulocytes: 0 %
Lymphocytes Relative: 26 %
Lymphs Abs: 0.6 10*3/uL — ABNORMAL LOW (ref 0.7–4.0)
MCH: 38.2 pg — ABNORMAL HIGH (ref 26.0–34.0)
MCHC: 32.9 g/dL (ref 30.0–36.0)
MCV: 116.2 fL — ABNORMAL HIGH (ref 80.0–100.0)
Monocytes Absolute: 0.2 10*3/uL (ref 0.1–1.0)
Monocytes Relative: 7 %
Neutro Abs: 1.5 10*3/uL — ABNORMAL LOW (ref 1.7–7.7)
Neutrophils Relative %: 65 %
Platelet Count: 431 10*3/uL — ABNORMAL HIGH (ref 150–400)
RBC: 2.41 MIL/uL — ABNORMAL LOW (ref 3.87–5.11)
RDW: 14.7 % (ref 11.5–15.5)
WBC Count: 2.3 10*3/uL — ABNORMAL LOW (ref 4.0–10.5)
nRBC: 0 % (ref 0.0–0.2)

## 2021-06-05 LAB — CMP (CANCER CENTER ONLY)
ALT: 11 U/L (ref 0–44)
AST: 20 U/L (ref 15–41)
Albumin: 3.7 g/dL (ref 3.5–5.0)
Alkaline Phosphatase: 39 U/L (ref 38–126)
Anion gap: 9 (ref 5–15)
BUN: 16 mg/dL (ref 8–23)
CO2: 26 mmol/L (ref 22–32)
Calcium: 9.2 mg/dL (ref 8.9–10.3)
Chloride: 107 mmol/L (ref 98–111)
Creatinine: 0.82 mg/dL (ref 0.44–1.00)
GFR, Estimated: >60 mL/min (ref >60)
Glucose, Bld: 157 mg/dL — ABNORMAL HIGH (ref 70–99)
Potassium: 4.2 mmol/L (ref 3.5–5.1)
Sodium: 142 mmol/L (ref 135–145)
Total Bilirubin: 0.6 mg/dL (ref 0.3–1.2)
Total Protein: 5.9 g/dL — ABNORMAL LOW (ref 6.5–8.1)

## 2021-06-05 LAB — LACTATE DEHYDROGENASE: LDH: 275 U/L — ABNORMAL HIGH (ref 98–192)

## 2021-06-05 NOTE — Progress Notes (Signed)
[facility] Telephone:(336) [phone]   Fax:([phone]  OFFICE PROGRESS NOTE  [name], MD [place] [address]  DIAGNOSIS: Essential thrombocythemia with positive JAK2 mutation V617F diagnosed in October 2020.  PRIOR THERAPY: None  CURRENT THERAPY: Hydroxyurea 500 mg p.o. daily.   INTERVAL HISTORY: [name] [name] 75 y.o. female returns to the clinic today for follow-up visit accompanied by her daughter [name] [name].  Her other daughter [name] [name] was available by phone during the visit because of some concern about the recent changes in her mom's condition.  They noticed that the patient has times of confusion and involuntary movement of her hands.  They are worried about the possibility of TIA.  They discussed their concern with her primary care physician Dr. Virgina Jock who recommended close monitoring.  She denied having any current chest pain, shortness of breath, cough or hemoptysis.  She has no nausea, vomiting, diarrhea or constipation.  She has no fever or chills.  She continues to tolerate her treatment with hydroxyurea fairly well.  The patient is here today for evaluation and repeat blood work.     MEDICAL HISTORY: Past Medical History:  Diagnosis Date   Anemia    Coronary artery disease    s/p stent to LAD and LCx   DM II (diabetes mellitus, type II), controlled (HCC)    DVT (deep venous thrombosis) (HCC)    Elevated LFTs    Hyperlipidemia    Hypertension    Myocardial infarction ([facility])    7 yrs ago    ALLERGIES:  is allergic to ampicillin, avelox [moxifloxacin hcl in nacl], bactrim [sulfamethoxazole-trimethoprim], ibuprofen, penicillins, codeine, and latex.  MEDICATIONS:  Current Outpatient Medications  Medication Sig Dispense Refill   fenofibrate micronized (LOFIBRA) 200 MG capsule TAKE ONE CAPSULE BY MOUTH ONCE DAILY BEFORE BREAKFAST 90 capsule 2   ferrous sulfate 325 (65 FE) MG EC tablet Take 325 mg by mouth daily.     Fish  Oil-Cholecalciferol (FISH OIL + D3) 1000-1000 MG-UNIT CAPS Take 1 tablet by mouth 3 (three) times daily.     hydroxyurea (HYDREA) 500 MG capsule TAKE 1 CAPSULE BY MOUTH ONCE DAILY WITH FOOD TO  MINIMIZE  GI  SIDE  EFFECTS. 30 capsule 2   metFORMIN (GLUCOPHAGE) 500 MG tablet Take 500 mg by mouth 2 (two) times daily with a meal.     nitroGLYCERIN (NITROSTAT) 0.4 MG SL tablet Place 1 tablet (0.4 mg total) under the tongue every 5 (five) minutes as needed. For chest pain. 25 tablet 0   OVER THE COUNTER MEDICATION Take 25 mg by mouth daily. ZINC     prochlorperazine (COMPAZINE) 10 MG tablet Take 1 tablet (10 mg total) by mouth every 6 (six) hours as needed for nausea or vomiting. 30 tablet 2   rivaroxaban (XARELTO) 20 MG TABS tablet Take 20 mg by mouth daily with supper.     rosuvastatin (CRESTOR) 20 MG tablet Take 10 mg by mouth daily.     warfarin (COUMADIN) 5 MG tablet Take 5 mg by mouth daily. (Patient not taking: Reported on 04/24/2021)     Zinc 25 MG TABS Take 1 tablet by mouth 2 (two) times daily.     No current facility-administered medications for this visit.    SURGICAL HISTORY:  Past Surgical History:  Procedure Laterality Date   CATARACT EXTRACTION     CESAREAN SECTION  1983   CORONARY ANGIOPLASTY WITH STENT PLACEMENT  04/26/2003   NORMAL. EF 65%  I & D EXTREMITY  06/22/2012   Procedure: IRRIGATION AND DEBRIDEMENT EXTREMITY;  Surgeon: Tennis Must, MD;  Location: [facility];  Service: Orthopedics;  Laterality: Left;   I & D EXTREMITY  06/29/2012   Procedure: IRRIGATION AND DEBRIDEMENT EXTREMITY;  Surgeon: Tennis Must, MD;  Location: [facility];  Service: Orthopedics;  Laterality: Left;   OPEN REDUCTION INTERNAL FIXATION (ORIF) DISTAL RADIAL FRACTURE Right 02/04/2018   Procedure: OPEN REDUCTION INTERNAL FIXATION (ORIF) RIGHT DISTAL RADIAL FRACTURE;  Surgeon: Leanora Cover, MD;  Location: [facility];  Service: Orthopedics;  Laterality: Right;    REVIEW OF SYSTEMS:  A comprehensive  review of systems was negative except for: Constitutional: positive for fatigue Neurological: positive for confusion    PHYSICAL EXAMINATION: General appearance: alert, cooperative, fatigued, and no distress Head: Normocephalic, without obvious abnormality, atraumatic Neck: no adenopathy, no JVD, supple, symmetrical, trachea midline, and thyroid not enlarged, symmetric, no tenderness/mass/nodules Lymph nodes: Cervical, supraclavicular, and axillary nodes normal. Resp: clear to auscultation bilaterally Back: symmetric, no curvature. ROM normal. No CVA tenderness. Cardio: regular rate and rhythm, S1, S2 normal, no murmur, click, rub or gallop GI: soft, non-tender; bowel sounds normal; no masses,  no organomegaly Extremities: extremities normal, atraumatic, no cyanosis or edema  ECOG PERFORMANCE STATUS: 1 - Symptomatic but completely ambulatory  Blood pressure (!) 134/46, pulse 61, temperature 98.8 F (37.1 C), temperature source Tympanic, resp. rate 18, height '5\' 2"'$  (1.575 m), weight 125 lb 6.4 oz (56.9 kg), SpO2 100 %.  LABORATORY DATA: Lab Results  Component Value Date   WBC 2.3 (L) 06/05/2021   HGB 9.2 (L) 06/05/2021   HCT 28.0 (L) 06/05/2021   MCV 116.2 (H) 06/05/2021   PLT 431 (H) 06/05/2021      Chemistry      Component Value Date/Time   NA 142 02/21/2021 1106   NA 141 01/29/2021 1107   K 4.1 02/21/2021 1106   CL 107 02/21/2021 1106   CO2 27 02/21/2021 1106   BUN 16 02/21/2021 1106   BUN 17 01/29/2021 1107   CREATININE 0.79 02/21/2021 1106      Component Value Date/Time   CALCIUM 8.8 (L) 02/21/2021 1106   ALKPHOS 42 02/21/2021 1106   AST 16 02/21/2021 1106   ALT <6 02/21/2021 1106   BILITOT 0.4 02/21/2021 1106       RADIOGRAPHIC STUDIES: No results found.  ASSESSMENT AND PLAN: This is a very pleasant 75 years old white female recently diagnosed with essential thrombocythemia with positive JAK2 mutation.  The patient also has anemia and leukocytosis secondary  to the myeloproliferative disorder. The patient is currently on treatment with hydroxyurea initially at 500 mg increased to 1000 mg but the patient could not tolerate the higher dose well and we switched her back to 500 mg p.o. daily.  She continues to tolerate her current dose of hydroxyurea 500 mg p.o. daily fairly well.  The patient mentions that her moments of confusion because she is under a lot of stress and dealing with a lot of financial issues. Repeat CBC today showed similar results to her previous lab work 6 weeks ago with persistent leukocytopenia, anemia and mild thrombocytosis. I recommended for the patient to continue her current treatment with hydroxyurea with the same dose. I will see her back for follow-up visit in 6 weeks for evaluation with repeat blood work. For the confusion and concern about possibility of TIA, the patient will follow with her primary care physician and she may need  referral to neurology for further evaluation if needed. She was advised to call immediately if she has any other concerning symptoms in the interval. The patient voices understanding of current disease status and treatment options and is in agreement with the current care plan.  All questions were answered. The patient knows to call the clinic with any problems, questions or concerns. We can certainly see the patient much sooner if necessary.  Disclaimer: This note was dictated with voice recognition software. Similar sounding words can inadvertently be transcribed and may not be corrected upon review.

## 2021-06-10 ENCOUNTER — Telehealth: Payer: Self-pay | Admitting: Internal Medicine

## 2021-06-10 NOTE — Telephone Encounter (Signed)
Scheduled per los. Called and spoke with patients daughter. Confirmed appts 

## 2021-06-17 ENCOUNTER — Ambulatory Visit: Payer: Medicare Other | Admitting: Neurology

## 2021-06-18 ENCOUNTER — Encounter: Payer: Self-pay | Admitting: Neurology

## 2021-06-20 ENCOUNTER — Telehealth: Payer: Self-pay

## 2021-06-20 DIAGNOSIS — Z5111 Encounter for antineoplastic chemotherapy: Secondary | ICD-10-CM

## 2021-06-20 DIAGNOSIS — D473 Essential (hemorrhagic) thrombocythemia: Secondary | ICD-10-CM

## 2021-06-20 MED ORDER — HYDROXYUREA 500 MG PO CAPS: ORAL_CAPSULE | ORAL | 2 refills | Status: DC

## 2021-06-20 NOTE — Telephone Encounter (Signed)
Pt LM requesting a refill of Hydroxurea.  Per Dr. Julien Nordmann, okay to send refill. I have called the pt back and advise rx has been sent.

## 2021-07-09 ENCOUNTER — Encounter: Payer: Self-pay | Admitting: Neurology

## 2021-07-09 ENCOUNTER — Ambulatory Visit (INDEPENDENT_AMBULATORY_CARE_PROVIDER_SITE_OTHER): Payer: Medicare Other | Admitting: Neurology

## 2021-07-09 ENCOUNTER — Telehealth: Payer: Self-pay | Admitting: Neurology

## 2021-07-09 VITALS — BP 129/60 | HR 60 | Ht 62.0 in | Wt 123.5 lb

## 2021-07-09 DIAGNOSIS — G308 Other Alzheimer's disease: Secondary | ICD-10-CM

## 2021-07-09 DIAGNOSIS — F430 Acute stress reaction: Secondary | ICD-10-CM | POA: Diagnosis not present

## 2021-07-09 DIAGNOSIS — G3184 Mild cognitive impairment, so stated: Secondary | ICD-10-CM | POA: Diagnosis not present

## 2021-07-09 NOTE — Patient Instructions (Signed)
B12 level  TSH level  24 hrs EEG    There are well-accepted and sensible ways to reduce risk for Alzheimers disease and other degenerative brain disorders .  Exercise Daily Walk A daily 20 minute walk should be part of your routine. Disease related apathy can be a significant roadblock to exercise and the only way to overcome this is to make it a daily routine and perhaps have a reward at the end (something your loved one loves to eat or drink perhaps) or a personal trainer coming to the home can also be very useful. Most importantly, the patient is much more likely to exercise if the caregiver / spouse does it with him/her. In general a structured, repetitive schedule is best.  General Health: Any diseases which effect your body will effect your brain such as a pneumonia, urinary infection, blood clot, heart attack or stroke. Keep contact with your primary care doctor for regular follow ups.  Sleep. A good nights sleep is healthy for the brain. Seven hours is recommended. If you have insomnia or poor sleep habits we can give you some instructions. If you have sleep apnea wear your mask.  Diet: Eating a heart healthy diet is also a good idea; fish and poultry instead of red meat, nuts (mostly non-peanuts), vegetables, fruits, olive oil or canola oil (instead of butter), minimal salt (use other spices to flavor foods), whole grain rice, bread, cereal and pasta and wine in moderation.Research is now showing that the MIND diet, which is a combination of The Mediterranean diet and the DASH diet, is beneficial for cognitive processing and longevity. Information about this diet can be found in The MIND Diet, a book by Doyne Keel, MS, RDN, and online at [employer]book[employer].si  Finances, Power of Attorney and Advance Directives: You should consider putting legal safeguards in place with regard to financial and medical decision making. While the spouse always has power of attorney for  medical and financial issues in the absence of any form, you should consider what you want in case the spouse / caregiver is no longer around or capable of making decisions.     Heart-head connection  New research shows there are things we can do to reduce the risk of mild cognitive impairment and dementia.  Several conditions known to increase the risk of cardiovascular disease -- such as high blood pressure, diabetes and high cholesterol -- also increase the risk of developing Alzheimer's. Some autopsy studies show that as many as 76 percent of individuals with Alzheimer's disease also have cardiovascular disease.  A longstanding question is why some people develop hallmark Alzheimer's plaques and tangles but do not develop the symptoms of Alzheimer's. Vascular disease may help researchers eventually find an answer. Some autopsy studies suggest that plaques and tangles may be present in the brain without causing symptoms of cognitive decline unless the brain also shows evidence of vascular disease. More research is needed to better understand the link between vascular health and Alzheimer's.  Physical exercise and diet Regular physical exercise may be a beneficial strategy to lower the risk of Alzheimer's and vascular dementia. Exercise may directly benefit brain cells by increasing blood and oxygen flow in the brain. Because of its known cardiovascular benefits, a medically approved exercise program is a valuable part of any overall wellness plan.  Current evidence suggests that heart-healthy eating may also help protect the brain. Heart-healthy eating includes limiting the intake of sugar and saturated fats and making sure to eat plenty of  fruits, vegetables, and whole grains. No one diet is best. Two diets that have been studied and may be beneficial are the DASH (Dietary Approaches to Stop Hypertension) diet and the Mediterranean diet. The DASH diet emphasizes vegetables, fruits and fat-free or  low-fat dairy products; includes whole grains, fish, poultry, beans, seeds, nuts and vegetable oils; and limits sodium, sweets, sugary beverages and red meats. A Mediterranean diet includes relatively little red meat and emphasizes whole grains, fruits and vegetables, fish and shellfish, and nuts, olive oil and other healthy fats.  Social connections and intellectual activity A number of studies indicate that maintaining strong social connections and keeping mentally active as we age might lower the risk of cognitive decline and Alzheimer's. Experts are not certain about the reason for this association. It may be due to direct mechanisms through which social and mental stimulation strengthen connections between nerve cells in the brain.  Head trauma There appears to be a strong link between future risk of Alzheimer's and serious head trauma, especially when injury involves loss of consciousness. You can help reduce your risk of Alzheimer's by protecting your head.  Wear a seat belt  Use a helmet when participating in sports  "Fall-proof" your home   What you can do now While research is not yet conclusive, certain lifestyle choices, such as physical activity and diet, may help support brain health and prevent Alzheimer's. Many of these lifestyle changes have been shown to lower the risk of other diseases, like heart disease and diabetes, which have been linked to Alzheimer's. With few drawbacks and plenty of known benefits, healthy lifestyle choices can improve your health and possibly protect your brain.  Learn more about brain health. You can help increase our knowledge by considering participation in a clinical study. Our free clinical trial matching services, Trial[employer], can help you find clinical trials in your area that are seeking volunteers.

## 2021-07-09 NOTE — Telephone Encounter (Signed)
24 hr EEG order faxed to [facility], they will reach out to pt to schedule.
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
## 2021-07-10 LAB — TSH: TSH: 1.17 u[IU]/mL (ref 0.450–4.500)

## 2021-07-10 LAB — VITAMIN B12: Vitamin B-12: 165 pg/mL — ABNORMAL LOW (ref 232–1245)

## 2021-07-10 MED ORDER — VITAMIN B-12 1000 MCG PO TABS: 1000.0000 ug | ORAL_TABLET | Freq: Every day | ORAL | 12 refills | Status: AC

## 2021-07-10 NOTE — Addendum Note (Signed)
Addended byAlric Ran on: 07/10/2021 02:57 PM   Modules accepted: Orders

## 2021-07-10 NOTE — Progress Notes (Signed)
Called patient, spoke with daughter [name].  I informed her that her vitamin B12 level was low 165 and her TSH was normal.  A prescription for vitamin B12 1000 mcg daily to sent the pharmacy.  Follow-up as scheduled

## 2021-07-15 NOTE — Telephone Encounter (Signed)
Patient is scheduled for her In-Home Video EEG from (9/23 - 07/27/21), per her request, with [facility].

## 2021-07-18 ENCOUNTER — Inpatient Hospital Stay: Payer: Medicare Other

## 2021-07-18 ENCOUNTER — Other Ambulatory Visit: Payer: Self-pay

## 2021-07-18 ENCOUNTER — Encounter: Payer: Self-pay | Admitting: Internal Medicine

## 2021-07-18 ENCOUNTER — Inpatient Hospital Stay: Payer: Medicare Other | Attending: Internal Medicine | Admitting: Internal Medicine

## 2021-07-18 VITALS — BP 142/55 | HR 70 | Temp 98.6°F | Resp 17 | Ht 62.0 in | Wt 124.7 lb

## 2021-07-18 DIAGNOSIS — Z86718 Personal history of other venous thrombosis and embolism: Secondary | ICD-10-CM | POA: Insufficient documentation

## 2021-07-18 DIAGNOSIS — E785 Hyperlipidemia, unspecified: Secondary | ICD-10-CM | POA: Insufficient documentation

## 2021-07-18 DIAGNOSIS — D473 Essential (hemorrhagic) thrombocythemia: Secondary | ICD-10-CM

## 2021-07-18 DIAGNOSIS — C946 Myelodysplastic disease, not classified: Secondary | ICD-10-CM | POA: Diagnosis not present

## 2021-07-18 DIAGNOSIS — E119 Type 2 diabetes mellitus without complications: Secondary | ICD-10-CM | POA: Insufficient documentation

## 2021-07-18 DIAGNOSIS — D649 Anemia, unspecified: Secondary | ICD-10-CM | POA: Diagnosis not present

## 2021-07-18 DIAGNOSIS — Z7984 Long term (current) use of oral hypoglycemic drugs: Secondary | ICD-10-CM | POA: Insufficient documentation

## 2021-07-18 DIAGNOSIS — Z7901 Long term (current) use of anticoagulants: Secondary | ICD-10-CM | POA: Insufficient documentation

## 2021-07-18 DIAGNOSIS — Z79899 Other long term (current) drug therapy: Secondary | ICD-10-CM | POA: Insufficient documentation

## 2021-07-18 DIAGNOSIS — I10 Essential (primary) hypertension: Secondary | ICD-10-CM | POA: Insufficient documentation

## 2021-07-18 DIAGNOSIS — I252 Old myocardial infarction: Secondary | ICD-10-CM | POA: Diagnosis not present

## 2021-07-18 LAB — CBC WITH DIFFERENTIAL (CANCER CENTER ONLY)
Abs Immature Granulocytes: 0.01 10*3/uL (ref 0.00–0.07)
Basophils Absolute: 0 10*3/uL (ref 0.0–0.1)
Basophils Relative: 0 %
Eosinophils Absolute: 0 10*3/uL (ref 0.0–0.5)
Eosinophils Relative: 1 %
HCT: 30.5 % — ABNORMAL LOW (ref 36.0–46.0)
Hemoglobin: 10 g/dL — ABNORMAL LOW (ref 12.0–15.0)
Immature Granulocytes: 0 %
Lymphocytes Relative: 21 %
Lymphs Abs: 0.7 10*3/uL (ref 0.7–4.0)
MCH: 37.3 pg — ABNORMAL HIGH (ref 26.0–34.0)
MCHC: 32.8 g/dL (ref 30.0–36.0)
MCV: 113.8 fL — ABNORMAL HIGH (ref 80.0–100.0)
Monocytes Absolute: 0.2 10*3/uL (ref 0.1–1.0)
Monocytes Relative: 6 %
Neutro Abs: 2.3 10*3/uL (ref 1.7–7.7)
Neutrophils Relative %: 72 %
Platelet Count: 625 10*3/uL — ABNORMAL HIGH (ref 150–400)
RBC: 2.68 MIL/uL — ABNORMAL LOW (ref 3.87–5.11)
RDW: 14.5 % (ref 11.5–15.5)
WBC Count: 3.2 10*3/uL — ABNORMAL LOW (ref 4.0–10.5)
nRBC: 0 % (ref 0.0–0.2)

## 2021-07-18 LAB — CMP (CANCER CENTER ONLY)
ALT: 6 U/L (ref 0–44)
AST: 19 U/L (ref 15–41)
Albumin: 4 g/dL (ref 3.5–5.0)
Alkaline Phosphatase: 44 U/L (ref 38–126)
Anion gap: 8 (ref 5–15)
BUN: 16 mg/dL (ref 8–23)
CO2: 27 mmol/L (ref 22–32)
Calcium: 9.3 mg/dL (ref 8.9–10.3)
Chloride: 107 mmol/L (ref 98–111)
Creatinine: 0.82 mg/dL (ref 0.44–1.00)
GFR, Estimated: >60 mL/min (ref >60)
Glucose, Bld: 76 mg/dL (ref 70–99)
Potassium: 4.1 mmol/L (ref 3.5–5.1)
Sodium: 142 mmol/L (ref 135–145)
Total Bilirubin: 0.5 mg/dL (ref 0.3–1.2)
Total Protein: 6.4 g/dL — ABNORMAL LOW (ref 6.5–8.1)

## 2021-07-18 NOTE — Progress Notes (Signed)
[name] [name] [phone]   [phone]  OFFICE PROGRESS NOTE  [name], MD [place] [address]  DIAGNOSIS: Essential thrombocythemia with positive JAK2 mutation [id number] diagnosed in October 2020.  PRIOR THERAPY: None  CURRENT THERAPY: Hydroxyurea 500 mg p.o. daily.   INTERVAL HISTORY: [name] [name] 75 y.o. female returns to the clinic today for follow-up visit accompanied by her daughter [name] [name].  The patient is feeling fine today with no concerning complaints.  She has been on treatment with hydroxyurea 500 mg p.o. daily and tolerating it well.  She has no bleeding, bruises or ecchymosis.  She has no nausea, vomiting, diarrhea or constipation.  She did The patient denied having any recent weight loss or night sweats.  She is here today for evaluation and repeat blood work.     MEDICAL HISTORY: Past Medical History:  Diagnosis Date   Anemia    Coronary artery disease    s/p stent to LAD and LCx   DM II (diabetes mellitus, type II), controlled ([place])    DVT (deep venous thrombosis) (HCC)    Elevated LFTs    Hyperlipidemia    Hypertension    Memory loss    Myocardial infarction ([place])    7 yrs ago    ALLERGIES:  is allergic to ampicillin, avelox [moxifloxacin hcl in nacl], bactrim [sulfamethoxazole-trimethoprim], ibuprofen, penicillins, codeine, latex, and whey.  MEDICATIONS:  Current Outpatient Medications  Medication Sig Dispense Refill   fenofibrate micronized (LOFIBRA) 200 MG capsule TAKE ONE CAPSULE BY MOUTH ONCE DAILY BEFORE BREAKFAST 90 capsule 2   ferrous sulfate 325 (65 FE) MG EC tablet Take 325 mg by mouth as needed.     Fish Oil-Cholecalciferol (FISH OIL + D3) 1000-1000 MG-UNIT CAPS Take 1 tablet by mouth 3 (three) times daily.     hydroxyurea (HYDREA) 500 MG capsule TAKE 1 CAPSULE BY MOUTH ONCE DAILY WITH FOOD TO  MINIMIZE  GI  SIDE  EFFECTS. 30 capsule 2   metFORMIN (GLUCOPHAGE) 500 MG tablet Take 500 mg by  mouth 2 (two) times daily with a meal.     rivaroxaban (XARELTO) 20 MG TABS tablet Take 20 mg by mouth daily with supper.     rosuvastatin (CRESTOR) 20 MG tablet Take 10 mg by mouth daily.     vitamin B-12 (CYANOCOBALAMIN) 1000 MCG tablet Take 1 tablet (1,000 mcg total) by mouth daily. 30 tablet 12   Zinc 25 MG TABS Take 1 tablet by mouth 2 (two) times daily.     nitroGLYCERIN (NITROSTAT) 0.4 MG SL tablet Place 1 tablet (0.4 mg total) under the tongue every 5 (five) minutes as needed. For chest pain. (Patient not taking: Reported on 07/18/2021) 25 tablet 0   prochlorperazine (COMPAZINE) 10 MG tablet Take 1 tablet (10 mg total) by mouth every 6 (six) hours as needed for nausea or vomiting. (Patient not taking: Reported on 07/18/2021) 30 tablet 2   No current facility-administered medications for this visit.    SURGICAL HISTORY:  Past Surgical History:  Procedure Laterality Date   CATARACT EXTRACTION     CESAREAN SECTION  1983   CORONARY ANGIOPLASTY WITH STENT PLACEMENT  04/26/2003   NORMAL. EF 65%   I & D EXTREMITY  06/22/2012   Procedure: IRRIGATION AND DEBRIDEMENT EXTREMITY;  Surgeon: Tennis Must, MD;  Location: [facility];  Service: Orthopedics;  Laterality: Left;   I & D EXTREMITY  06/29/2012   Procedure: IRRIGATION AND DEBRIDEMENT EXTREMITY;  Surgeon: Tennis Must, MD;  Location: [facility];  Service: Orthopedics;  Laterality: Left;   OPEN REDUCTION INTERNAL FIXATION (ORIF) DISTAL RADIAL FRACTURE Right 02/04/2018   Procedure: OPEN REDUCTION INTERNAL FIXATION (ORIF) RIGHT DISTAL RADIAL FRACTURE;  Surgeon: Leanora Cover, MD;  Location: [facility];  Service: Orthopedics;  Laterality: Right;    REVIEW OF SYSTEMS:  A comprehensive review of systems was negative.   PHYSICAL EXAMINATION: General appearance: alert, cooperative, and no distress Head: Normocephalic, without obvious abnormality, atraumatic Neck: no adenopathy, no JVD, supple, symmetrical, trachea midline, and thyroid not  enlarged, symmetric, no tenderness/mass/nodules Lymph nodes: Cervical, supraclavicular, and axillary nodes normal. Resp: clear to auscultation bilaterally Back: symmetric, no curvature. ROM normal. No CVA tenderness. Cardio: regular rate and rhythm, S1, S2 normal, no murmur, click, rub or gallop GI: soft, non-tender; bowel sounds normal; no masses,  no organomegaly Extremities: extremities normal, atraumatic, no cyanosis or edema  ECOG PERFORMANCE STATUS: 1 - Symptomatic but completely ambulatory  Blood pressure (!) 142/55, pulse 70, temperature 98.6 F (37 C), temperature source Tympanic, resp. rate 17, height '5\' 2"'$  (1.575 m), weight 124 lb 11.2 oz (56.6 kg), SpO2 100 %.  LABORATORY DATA: Lab Results  Component Value Date   WBC 3.2 (L) 07/18/2021   HGB 10.0 (L) 07/18/2021   HCT 30.5 (L) 07/18/2021   MCV 113.8 (H) 07/18/2021   PLT 625 (H) 07/18/2021      Chemistry      Component Value Date/Time   NA 142 07/18/2021 1043   NA 141 01/29/2021 1107   K 4.1 07/18/2021 1043   CL 107 07/18/2021 1043   CO2 27 07/18/2021 1043   BUN 16 07/18/2021 1043   BUN 17 01/29/2021 1107   CREATININE 0.82 07/18/2021 1043      Component Value Date/Time   CALCIUM 9.3 07/18/2021 1043   ALKPHOS 44 07/18/2021 1043   AST 19 07/18/2021 1043   ALT 6 07/18/2021 1043   BILITOT 0.5 07/18/2021 1043       RADIOGRAPHIC STUDIES: No results found.  ASSESSMENT AND PLAN: This is a very pleasant 75 years old white female recently diagnosed with essential thrombocythemia with positive JAK2 mutation.  The patient also has anemia and leukocytosis secondary to the myeloproliferative disorder. The patient is currently on treatment with hydroxyurea initially at 500 mg increased to 1000 mg but the patient could not tolerate the higher dose well and we switched her back to 500 mg p.o. daily.  The patient has been tolerating the once daily 500 mg dose fairly well. Repeat CBC today showed elevation of the platelets  count up to 625,000. I recommended for the patient to start taking hydroxyurea 1000 mg on Monday, Wednesday and Friday and 500 mg on the other days. I will see her back for follow-up visit in 1 months for evaluation and repeat blood work. She was advised to call immediately if she has any other concerning symptoms in the interval. For the confusion and concern about possibility of TIA, the patient will follow with her primary care physician and she may need referral to neurology for further evaluation if needed. She was advised to call immediately if she has any other concerning symptoms in the interval. The patient voices understanding of current disease status and treatment options and is in agreement with the current care plan.  All questions were answered. The patient knows to call the clinic with any problems, questions or concerns. We can certainly see the patient much sooner if  necessary.  Disclaimer: This note was dictated with voice recognition software. Similar sounding words can inadvertently be transcribed and may not be corrected upon review.

## 2021-07-19 ENCOUNTER — Telehealth: Payer: Self-pay | Admitting: Internal Medicine

## 2021-07-19 NOTE — Telephone Encounter (Signed)
Scheduled appt per 9/15 los - left message for daughter with appt date and time

## 2021-08-09 DIAGNOSIS — R569 Unspecified convulsions: Secondary | ICD-10-CM | POA: Diagnosis not present

## 2021-08-10 DIAGNOSIS — R569 Unspecified convulsions: Secondary | ICD-10-CM | POA: Diagnosis not present

## 2021-08-13 ENCOUNTER — Ambulatory Visit: Payer: Medicare Other | Admitting: Neurology

## 2021-08-13 ENCOUNTER — Other Ambulatory Visit: Payer: Self-pay | Admitting: Neurology

## 2021-08-13 DIAGNOSIS — G3184 Mild cognitive impairment, so stated: Secondary | ICD-10-CM

## 2021-08-13 NOTE — Procedures (Signed)
    Description 24 Hours Ambulatory Video EEG  TECHNICAL DESCRIPTION: This AVEEG was performed using equipment provided by [employer] utilizing Bluetooth (Trackit ) amplifiers with continuous EEGT attended video collection using encrypted remote transmission via [place] secured cellular tower network with data rates for each AVEEG performed. This is a Biomedical engineer AVEEG, obtained, according to the 10-20 international electrode placement system, reformatted digitally into referential and bipolar montages. Data was acquired with a minimum of 21 bipolar connections and sampled at a minimum rate of 250 cycles per second per channel, maximum rate of 450 cycles per second per channel and two channels for EKG. The entire AVEEG study was recorded through cable and or radio telemetry for subsequent analysis. Specified epochs of the AVEEG data were identified at the direction of the subject by the depression of a push button by the patient. Each patient's event file included data acquired two minutes prior to the push button activation and continuing until two minutes afterwards. AVEEG files were reviewed on [employer] Platform by [employer] with a digital high frequency filter set at 70 Hz and a low frequency filter set at 0.1 Hz with a paper speed of 33mm/s resulting in 10 seconds per digital page. This entire AVEEG was reviewed by the EEG Technologist. Random time samples, random sleep samples, clips, patient initiated push button files with included patient daily diary logs, EEG Technologist bookmarked note data was reviewed and verified for accuracy and validity by the governing reading neurologist in full details. This AEEGV was fully compliant with all requirements for CPT 97500 for setup, patient education, take down and administered by an EEG technologist.   INTERMITTENT MONITORING WITH VIDEO TECHNICAL DESCRIPTION:  This Long-Term AVEEG was monitored intermittently by a qualified EEG technologist for the  entirety of the recording; quality check-ins were performed at a minimum of every two hours, checking and documenting real-time data and video to assure the integrity and quality of the recording (e.g., camera position, electrode integrity and impedance), and identify the need for maintenance. For intermittent monitoring, an EEG Technologist monitored no more than 12 patients concurrently. Diagnostic video was captured at least 80% of the time during the recording.   TECHNOLOGIST EVENTS: Notes for BI temporal sharp waves with right temporal predominance were detected by the reviewing neurodiagnostic technologist during the recording for further evaluation.  PATIENT EVENTS:  The patient pushed the event button 0 times during the AVEEG recording for further evaluation. However, EEGT test press #1 time with pt at the beginning of the tracing.  TIME SAMPLES: 1 minute of every hour recorded are reviewed as random time samples.   SLEEP SAMPLES: 5 minutes of every 24 hour recorded sleep cycle are reviewed as random sleep samples.   BACKGROUND EEG: This AVEEG consists of well-modulated bilateral synchronous and symmetrical background in the alpha frequencies in the awake state.  AWAKE: The posterior dominant rhythm was characterized by symmetric and reactive 9-10 Hz activity with eyes closed.   SLEEP: Stage two sleep displayed Sleep Spindles and Vertex Waveform components. All other sleep stages appeared symmetrical and synchronous with regards to K-Complexes, and REM.  EKG: Normal sinus rhythm.    AVEEG Interpretations: This is an abnormal video ambulatory EEG due to presence of bilateral right greater than left temporal regions epileptiform discharges. This EEG is consistent with bitemporal regions of epileptogenic potential right greater than left.     Alric Ran, MD [facility]

## 2021-08-13 NOTE — Procedures (Signed)
    Technical Data: This RVEEG was performed using equipment provided by [employer] utilizing Bluetooth ( Trackit ) amplifiers with EEGT attended video collected and performed with a 19-channel digital VEEG, Data was acquired with a minimum of 21 bipolar connections and sampled at a minimum rate of 250 cycles per second per channel and one channel for EKG, obtained according to 10-20 international electrode placement system, reformatted digitally into referential and bipolar montages, reviewed and verified for accuracy and validity by the governing reading neurologist in full details.   Activating Procedure:  Photic Stimulation was Performed. Photic Driving Response seen. Hyperventilation was not performed due to pt contraindication of Droplet Precaution-COVID-19.   Notes for bi temporal sharp waves were detected by the reviewing neurodiagnostic technologist for further evaluation.   Intermittent artifacts could not be resolved. All impedance under 10K ohms.   Pt is awake, alert, cooperative and orientated Xs 4 of 4? correct.   EEGT Attended Video Recording: Yes   Hardware - (Trackit) by [place] Description:  This RVEEG was obtained during awake and drowsy states, during wakefulness posterior dominant rhythm was 9-10 Hz, alpha, well-modulated, moderate in voltage, and attenuated appropriately with eye opening. During the recording there was no generalized or focal slowing noted. There were presence of bitemporal sharps. Drowsiness was marked by stage I sleep. Hyperventilation was not performed due to pt contraindication of Droplet Precaution. Photic stimulation resulted in posterior driving responses at multiple frequencies, without any abnormal photo paroxysmal responses. EKG normal sinus rhythm.    RVEEG Interpretations:  This is an abnormal video routine EEG due to presence of bitemporal sharps. Bitemporal sharps are consistent with an area of epileptogenic  potential in the bitemporal regions.     Alric Ran, MD [facility]

## 2021-08-14 ENCOUNTER — Emergency Department (HOSPITAL_BASED_OUTPATIENT_CLINIC_OR_DEPARTMENT_OTHER): Payer: Medicare Other

## 2021-08-14 ENCOUNTER — Encounter (HOSPITAL_BASED_OUTPATIENT_CLINIC_OR_DEPARTMENT_OTHER): Payer: Self-pay

## 2021-08-14 ENCOUNTER — Emergency Department (HOSPITAL_BASED_OUTPATIENT_CLINIC_OR_DEPARTMENT_OTHER)
Admission: EM | Admit: 2021-08-14 | Discharge: 2021-08-14 | Disposition: A | Discharge status: Home / Self Care | Payer: Medicare Other | Attending: Emergency Medicine | Admitting: Emergency Medicine

## 2021-08-14 ENCOUNTER — Other Ambulatory Visit: Payer: Self-pay

## 2021-08-14 ENCOUNTER — Telehealth: Payer: Self-pay | Admitting: Neurology

## 2021-08-14 DIAGNOSIS — I10 Essential (primary) hypertension: Secondary | ICD-10-CM | POA: Diagnosis not present

## 2021-08-14 DIAGNOSIS — Z955 Presence of coronary angioplasty implant and graft: Secondary | ICD-10-CM | POA: Diagnosis not present

## 2021-08-14 DIAGNOSIS — I2510 Atherosclerotic heart disease of native coronary artery without angina pectoris: Secondary | ICD-10-CM | POA: Insufficient documentation

## 2021-08-14 DIAGNOSIS — Z7984 Long term (current) use of oral hypoglycemic drugs: Secondary | ICD-10-CM | POA: Insufficient documentation

## 2021-08-14 DIAGNOSIS — S0591XA Unspecified injury of right eye and orbit, initial encounter: Secondary | ICD-10-CM | POA: Diagnosis present

## 2021-08-14 DIAGNOSIS — S0990XA Unspecified injury of head, initial encounter: Secondary | ICD-10-CM | POA: Diagnosis not present

## 2021-08-14 DIAGNOSIS — R0781 Pleurodynia: Secondary | ICD-10-CM | POA: Diagnosis not present

## 2021-08-14 DIAGNOSIS — Z7901 Long term (current) use of anticoagulants: Secondary | ICD-10-CM | POA: Diagnosis not present

## 2021-08-14 DIAGNOSIS — W19XXXA Unspecified fall, initial encounter: Secondary | ICD-10-CM

## 2021-08-14 DIAGNOSIS — R519 Headache, unspecified: Secondary | ICD-10-CM | POA: Diagnosis not present

## 2021-08-14 DIAGNOSIS — G319 Degenerative disease of nervous system, unspecified: Secondary | ICD-10-CM | POA: Diagnosis not present

## 2021-08-14 DIAGNOSIS — E119 Type 2 diabetes mellitus without complications: Secondary | ICD-10-CM | POA: Diagnosis not present

## 2021-08-14 DIAGNOSIS — S01311A Laceration without foreign body of right ear, initial encounter: Secondary | ICD-10-CM | POA: Diagnosis not present

## 2021-08-14 DIAGNOSIS — Z91040 Latex allergy status: Secondary | ICD-10-CM | POA: Insufficient documentation

## 2021-08-14 DIAGNOSIS — W01198A Fall on same level from slipping, tripping and stumbling with subsequent striking against other object, initial encounter: Secondary | ICD-10-CM | POA: Diagnosis not present

## 2021-08-14 LAB — CBC WITH DIFFERENTIAL/PLATELET
Abs Immature Granulocytes: 0.02 10*3/uL (ref 0.00–0.07)
Basophils Absolute: 0 10*3/uL (ref 0.0–0.1)
Basophils Relative: 0 %
Eosinophils Absolute: 0 10*3/uL (ref 0.0–0.5)
Eosinophils Relative: 1 %
HCT: 27 % — ABNORMAL LOW (ref 36.0–46.0)
Hemoglobin: 8.9 g/dL — ABNORMAL LOW (ref 12.0–15.0)
Immature Granulocytes: 1 %
Lymphocytes Relative: 28 %
Lymphs Abs: 0.5 10*3/uL — ABNORMAL LOW (ref 0.7–4.0)
MCH: 36.8 pg — ABNORMAL HIGH (ref 26.0–34.0)
MCHC: 33 g/dL (ref 30.0–36.0)
MCV: 111.6 fL — ABNORMAL HIGH (ref 80.0–100.0)
Monocytes Absolute: 0.3 10*3/uL (ref 0.1–1.0)
Monocytes Relative: 18 %
Neutro Abs: 1 10*3/uL — ABNORMAL LOW (ref 1.7–7.7)
Neutrophils Relative %: 52 %
Platelets: 272 10*3/uL (ref 150–400)
RBC: 2.42 MIL/uL — ABNORMAL LOW (ref 3.87–5.11)
RDW: 15.8 % — ABNORMAL HIGH (ref 11.5–15.5)
WBC: 1.9 10*3/uL — ABNORMAL LOW (ref 4.0–10.5)
nRBC: 0 % (ref 0.0–0.2)

## 2021-08-14 LAB — BASIC METABOLIC PANEL
Anion gap: 8 (ref 5–15)
BUN: 28 mg/dL — ABNORMAL HIGH (ref 8–23)
CO2: 26 mmol/L (ref 22–32)
Calcium: 8.5 mg/dL — ABNORMAL LOW (ref 8.9–10.3)
Chloride: 103 mmol/L (ref 98–111)
Creatinine, Ser: 1.03 mg/dL — ABNORMAL HIGH (ref 0.44–1.00)
GFR, Estimated: 57 mL/min — ABNORMAL LOW (ref >60)
Glucose, Bld: 107 mg/dL — ABNORMAL HIGH (ref 70–99)
Potassium: 3.8 mmol/L (ref 3.5–5.1)
Sodium: 137 mmol/L (ref 135–145)

## 2021-08-14 MED ORDER — LEVETIRACETAM 250 MG PO TABS: 250.0000 mg | ORAL_TABLET | Freq: Two times a day (BID) | ORAL | 4 refills | Status: AC

## 2021-08-14 NOTE — ED Provider Notes (Signed)
[name] EMERGENCY DEPARTMENT Provider Note   CSN: [id number] Arrival date & time: 08/14/21  1103     History Chief Complaint  Patient presents with   [name] [name] is a 75 y.o. female.  75 yo F with a chief complaints of a fall.  The patient is actually fallen twice in the past few days.  She struck her head both times and also struck her back.  Patient has no complaints, states that her daughters have made her come here.  She seems to have suffered a laceration to the right here as well as has had some bruising around that ear to the posterior auricular area.  She denies any neck pain denies any spinal pain.  Points to the left posterior rib angle as area of some discomfort.  Denies any other injury denies confusion denies vomiting.  Some other history obtained by family, patient has had progressive decline and is seen neurology recently with an MRI in the past month as well as an EEG that did not show any specific cause of her symptoms.  The history is provided by the patient.  Fall This is a new problem. The current episode started 2 days ago. The problem occurs rarely. The problem has been resolved. Associated symptoms include headaches. Pertinent negatives include no chest pain and no shortness of breath. Nothing aggravates the symptoms. Nothing relieves the symptoms. She has tried nothing for the symptoms. The treatment provided no relief.      Past Medical History:  Diagnosis Date   Anemia    Coronary artery disease    s/p stent to LAD and LCx   DM II (diabetes mellitus, type II), controlled ([place])    DVT (deep venous thrombosis) (H[facility])    Elevated LFTs    Hyperlipidemia    Hypertension    Memory loss    Myocardial infarction ([place])    7 yrs ago    Patient Active Problem List   Diagnosis Date Noted   Essential thrombocythemia ([place]) 10/05/2019   Nausea without vomiting 10/05/2019   Encounter for antineoplastic chemotherapy 09/21/2019   Goals  of care, counseling/discussion 09/21/2019   Anemia 09/07/2019   Leukocytosis 09/07/2019   Mixed hyperlipidemia 09/01/2016   DVT (deep venous thrombosis) ([place]) 09/18/2014   Drug-induced hypersensitivity reaction 06/28/2012   Fever 06/28/2012   Lactic acidosis 06/28/2012   Hand abscess 06/28/2012   Hyponatremia 06/28/2012   Hypertension 06/28/2012   DM2 (diabetes mellitus, type 2) ([place]) 06/28/2012   Petechiae 06/28/2012   CAD (coronary artery disease) 04/28/2011    Past Surgical History:  Procedure Laterality Date   CATARACT EXTRACTION     CESAREAN SECTION  1983   CORONARY ANGIOPLASTY WITH STENT PLACEMENT  04/26/2003   NORMAL. EF 65%   I & D EXTREMITY  06/22/2012   Procedure: IRRIGATION AND DEBRIDEMENT EXTREMITY;  Surgeon: Tennis Must, MD;  Location: [facility];  Service: Orthopedics;  Laterality: Left;   I & D EXTREMITY  06/29/2012   Procedure: IRRIGATION AND DEBRIDEMENT EXTREMITY;  Surgeon: Tennis Must, MD;  Location: [facility];  Service: Orthopedics;  Laterality: Left;   OPEN REDUCTION INTERNAL FIXATION (ORIF) DISTAL RADIAL FRACTURE Right 02/04/2018   Procedure: OPEN REDUCTION INTERNAL FIXATION (ORIF) RIGHT DISTAL RADIAL FRACTURE;  Surgeon: Leanora Cover, MD;  Location: [facility];  Service: Orthopedics;  Laterality: Right;     OB History   No obstetric history on file.     Family History  Problem Relation Age of Onset   Heart attack Mother        X2   Aneurysm Mother        AAA   Stroke Mother    Heart disease Mother    Heart attack Father    Heart disease Father    Cirrhosis Father    Osteoarthritis Sister    Dementia Sister     Social History   Tobacco Use   Smoking status: Never   Smokeless tobacco: Never  Vaping Use   Vaping Use: Never used  Substance Use Topics   Alcohol use: Yes    Comment: social    Drug use: No    Home Medications Prior to Admission medications   Medication Sig Start Date End Date Taking? Authorizing Provider   fenofibrate micronized (LOFIBRA) 200 MG capsule TAKE ONE CAPSULE BY MOUTH ONCE DAILY BEFORE BREAKFAST 05/18/17   Nahser, Wonda Cheng, MD  ferrous sulfate 325 (65 FE) MG EC tablet Take 325 mg by mouth as needed.    [provider]  Fish Oil-Cholecalciferol (FISH OIL + D3) 1000-1000 MG-UNIT CAPS Take 1 tablet by mouth 3 (three) times daily.    [provider]  hydroxyurea (HYDREA) 500 MG capsule TAKE 1 CAPSULE BY MOUTH ONCE DAILY WITH FOOD TO  MINIMIZE  GI  SIDE  EFFECTS. 06/20/21   Curt Bears, MD  levETIRAcetam (KEPPRA) 250 MG tablet Take 1 tablet (250 mg total) by mouth 2 (two) times daily. 08/14/21 11/12/21  Alric Ran, MD  metFORMIN (GLUCOPHAGE) 500 MG tablet Take 500 mg by mouth 2 (two) times daily with a meal.    [provider]  nitroGLYCERIN (NITROSTAT) 0.4 MG SL tablet Place 1 tablet (0.4 mg total) under the tongue every 5 (five) minutes as needed. For chest pain. Patient not taking: Reported on 07/18/2021 01/04/20   Nahser, Wonda Cheng, MD  prochlorperazine (COMPAZINE) 10 MG tablet Take 1 tablet (10 mg total) by mouth every 6 (six) hours as needed for nausea or vomiting. Patient not taking: Reported on 07/18/2021 10/05/19   Heilingoetter, Cassandra L, PA-C  rivaroxaban (XARELTO) 20 MG TABS tablet Take 20 mg by mouth daily with supper.    [provider]  rosuvastatin (CRESTOR) 20 MG tablet Take 10 mg by mouth daily.    [provider]  Zinc 25 MG TABS Take 1 tablet by mouth 2 (two) times daily.    [provider]    Allergies    Ampicillin, Avelox [moxifloxacin hcl in nacl], Bactrim [sulfamethoxazole-trimethoprim], Ibuprofen, Penicillins, Codeine, Latex, and Whey  Review of Systems   Review of Systems  Constitutional:  Negative for chills and fever.  HENT:  Negative for congestion and rhinorrhea.   Eyes:  Negative for redness and visual disturbance.  Respiratory:  Negative for shortness of breath and wheezing.   Cardiovascular:   Negative for chest pain and palpitations.  Gastrointestinal:  Negative for nausea and vomiting.  Genitourinary:  Negative for dysuria and urgency.  Musculoskeletal:  Negative for arthralgias and myalgias.  Skin:  Positive for wound. Negative for pallor.  Neurological:  Positive for headaches. Negative for dizziness.   Physical Exam Updated Vital Signs BP (!) 117/52   Pulse 63   Temp 98.3 F (36.8 C) (Oral)   Resp 18   Ht 5\' 2"  (1.575 m)   Wt 55.3 kg   SpO2 98%   BMI 22.31 kg/m   Physical Exam Vitals and nursing note reviewed.  Constitutional:  General: She is not in acute distress.    Appearance: She is well-developed. She is not diaphoretic.  HENT:     Head: Normocephalic and atraumatic.     Ears:     Comments: Laceration on both the anterior and posterior aspects of the right ear.  Scabbed over.  Some mild edema.  Mild bruising to the mastoid process on the right. Eyes:     Pupils: Pupils are equal, round, and reactive to light.  Cardiovascular:     Rate and Rhythm: Normal rate and regular rhythm.     Heart sounds: No murmur heard.   No friction rub. No gallop.  Pulmonary:     Effort: Pulmonary effort is normal.     Breath sounds: No wheezing or rales.  Abdominal:     General: There is no distension.     Palpations: Abdomen is soft.     Tenderness: There is no abdominal tenderness.  Musculoskeletal:        General: Tenderness present.     Cervical back: Normal range of motion and neck supple.     Comments: Mild pain to the left lateral ribs.  No spinal step-offs or deformities.  No obvious midline spinal tenderness.  Able to rotate her head 45 degrees in either direction without pain.  Palpated from head to toe without any noted areas of bony tenderness.  Skin:    General: Skin is warm and dry.  Neurological:     Mental Status: She is alert and oriented to person, place, and time.  Psychiatric:        Behavior: Behavior normal.    ED Results / Procedures /  Treatments   Labs (all labs ordered are listed, but only abnormal results are displayed) Labs Reviewed  CBC WITH DIFFERENTIAL/PLATELET - Abnormal; Notable for the following components:      Result Value   WBC 1.9 (*)    RBC 2.42 (*)    Hemoglobin 8.9 (*)    HCT 27.0 (*)    MCV 111.6 (*)    MCH 36.8 (*)    RDW 15.8 (*)    Neutro Abs 1.0 (*)    Lymphs Abs 0.5 (*)    All other components within normal limits  BASIC METABOLIC PANEL - Abnormal; Notable for the following components:   Glucose, Bld 107 (*)    BUN 28 (*)    Creatinine, Ser 1.03 (*)    Calcium 8.5 (*)    GFR, Estimated 57 (*)    All other components within normal limits    EKG EKG Interpretation  Date/Time:  Wednesday August 14 2021 13:00:56 EDT Ventricular Rate:  65 PR Interval:  177 QRS Duration: 109 QT Interval:  405 QTC Calculation: 422 R Axis:   50 Text Interpretation: Sinus rhythm Low voltage, precordial leads frequent pvc resolved t waves inverted in inferior leads seen on prior Confirmed by Deno Etienne [phone]) on 08/14/2021 2:10:45 PM  Radiology DG Ribs Unilateral W/Chest Left  Result Date: 08/14/2021 CLINICAL DATA:  Left rib pain after fall. EXAM: LEFT RIBS AND CHEST - 3+ VIEW COMPARISON:  June 29, 2012. FINDINGS: No fracture or other bone lesions are seen involving the ribs. There is no evidence of pneumothorax or pleural effusion. Both lungs are clear. Heart size and mediastinal contours are within normal limits. IMPRESSION: Negative. Electronically Signed   By: Marijo Conception M.D.   On: 08/14/2021 14:07   CT Head Wo Contrast  Result Date: 08/14/2021 CLINICAL DATA:  Minor head trauma.  Fall. EXAM: CT HEAD WITHOUT CONTRAST TECHNIQUE: Contiguous axial images were obtained from the base of the skull through the vertex without intravenous contrast. COMPARISON:  06/28/2012.  MRI head 06/05/2021 FINDINGS: Brain: No evidence of acute infarction, hemorrhage, hydrocephalus, extra-axial collection or mass  lesion/mass effect. Mild atrophy and mild white matter hypodensity bilaterally, with progression from the prior study. Vascular: Negative for hyperdense vessel Skull: Negative Sinuses/Orbits: Mucosal edema paranasal sinuses.  Negative orbit Other: None IMPRESSION: No acute intracranial abnormality Mild atrophy and mild chronic microvascular ischemic change, with progression since 2013 Electronically Signed   By: Franchot Gallo M.D.   On: 08/14/2021 14:11   EEG adult  Result Date: 08/13/2021 Alric Ran, MD     08/13/2021  9:17 PM Technical Data: This RVEEG was performed using equipment provided by [employer] utilizing Bluetooth ( Trackit ) amplifiers with EEGT attended video collected and performed with a 19-channel digital VEEG, Data was acquired with a minimum of 21 bipolar connections and sampled at a minimum rate of 250 cycles per second per channel and one channel for EKG, obtained according to 10-20 international electrode placement system, reformatted digitally into referential and bipolar montages, reviewed and verified for accuracy and validity by the governing reading neurologist in full details. Activating Procedure: Photic Stimulation was Performed. Photic Driving Response seen. Hyperventilation was not performed due to pt contraindication of Droplet Precaution-COVID-19. Notes for bi temporal sharp waves were detected by the reviewing neurodiagnostic technologist for further evaluation. Intermittent artifacts could not be resolved. All impedance under 10K ohms. Pt is awake, alert, cooperative and orientated Xs 4 of 4? correct. EEGT Attended Video Recording: Yes Hardware - (Trackit) by [place] Description: This RVEEG was obtained during awake and drowsy states, during wakefulness posterior dominant rhythm was 9-10 Hz, alpha, well-modulated, moderate in voltage, and attenuated appropriately with eye opening. During the recording there was no generalized or focal slowing  noted. There were presence of bitemporal sharps. Drowsiness was marked by stage I sleep. Hyperventilation was not performed due to pt contraindication of Droplet Precaution. Photic stimulation resulted in posterior driving responses at multiple frequencies, without any abnormal photo paroxysmal responses. EKG normal sinus rhythm.  RVEEG Interpretations:  This is an abnormal video routine EEG due to presence of bitemporal sharps. Bitemporal sharps are consistent with an area of epileptogenic potential in the bitemporal regions. Alric Ran, MD [facility]   AMBULATORY EEG  Result Date: 08/13/2021 Alric Ran, MD     08/13/2021  9:17 PM Description 24 Hours Ambulatory Video EEG TECHNICAL DESCRIPTION: This AVEEG was performed using equipment provided by [employer] utilizing Bluetooth (Trackit ) amplifiers with continuous EEGT attended video collection using encrypted remote transmission via [place] secured cellular tower network with data rates for each AVEEG performed. This is a Biomedical engineer AVEEG, obtained, according to the 10-20 international electrode placement system, reformatted digitally into referential and bipolar montages. Data was acquired with a minimum of 21 bipolar connections and sampled at a minimum rate of 250 cycles per second per channel, maximum rate of 450 cycles per second per channel and two channels for EKG. The entire AVEEG study was recorded through cable and or radio telemetry for subsequent analysis. Specified epochs of the AVEEG data were identified at the direction of the subject by the depression of a push button by the patient. Each patient's event file included data acquired two minutes prior to the push button activation and continuing until two minutes afterwards.  AVEEG files were reviewed on [employer] by [employer] with a digital high frequency filter set at 70 Hz and a low frequency filter set at 0.1 Hz with a paper speed of 16mm/s  resulting in 10 seconds per digital page. This entire AVEEG was reviewed by the EEG Technologist. Random time samples, random sleep samples, clips, patient initiated push button files with included patient daily diary logs, EEG Technologist bookmarked note data was reviewed and verified for accuracy and validity by the governing reading neurologist in full details. This AEEGV was fully compliant with all requirements for CPT 97500 for setup, patient education, take down and administered by an EEG technologist. INTERMITTENT MONITORING WITH VIDEO TECHNICAL DESCRIPTION:  This Long-Term AVEEG was monitored intermittently by a qualified EEG technologist for the entirety of the recording; quality check-ins were performed at a minimum of every two hours, checking and documenting real-time data and video to assure the integrity and quality of the recording (e.g., camera position, electrode integrity and impedance), and identify the need for maintenance. For intermittent monitoring, an EEG Technologist monitored no more than 12 patients concurrently. Diagnostic video was captured at least 80% of the time during the recording. TECHNOLOGIST EVENTS: Notes for BI temporal sharp waves with right temporal predominance were detected by the reviewing neurodiagnostic technologist during the recording for further evaluation. PATIENT EVENTS:  The patient pushed the event button 0 times during the AVEEG recording for further evaluation. However, EEGT test press #1 time with pt at the beginning of the tracing. TIME SAMPLES: 1 minute of every hour recorded are reviewed as random time samples. SLEEP SAMPLES: 5 minutes of every 24 hour recorded sleep cycle are reviewed as random sleep samples. BACKGROUND EEG: This AVEEG consists of well-modulated bilateral synchronous and symmetrical background in the alpha frequencies in the awake state. AWAKE: The posterior dominant rhythm was characterized by symmetric and reactive 9-10 Hz activity with  eyes closed. SLEEP: Stage two sleep displayed Sleep Spindles and Vertex Waveform components. All other sleep stages appeared symmetrical and synchronous with regards to K-Complexes, and REM. EKG: Normal sinus rhythm. AVEEG Interpretations: This is an abnormal video ambulatory EEG due to presence of bilateral right greater than left temporal regions epileptiform discharges. This EEG is consistent with bitemporal regions of epileptogenic potential right greater than left. Alric Ran, MD [facility]    Procedures Procedures   Medications Ordered in ED Medications - No data to display  ED Course  I have reviewed the triage vital signs and the nursing notes.  Pertinent labs & imaging results that were available during my care of the patient were reviewed by me and considered in my medical decision making (see chart for details).    MDM Rules/Calculators/A&P                           75 yo F with a cc of a fall.  Two in the past week.  Both sound nonsyncopal by history.  Patient states she was bending over to put her close on both times.  Has had a bit of a progressive decline with her memory and to see neurology recently.  We will obtain a CT scan of the head plain film of the chest blood work IV fluids reassess.  The patient CBC has resulted with some progressive decrease in their cell counts.  Their white count is now below 2 neutrophils of 1, hemoglobin 8.9.  Patient's renal function is slightly bumped from  0.8-1.  No significant electrolyte abnormality.  Chest x-ray viewed by me without focal infiltrate or pneumothorax.  CT scan of the head negative for intracranial hemorrhage.  I discussed the lab results with the patient and family.  They received a phone call while they are here about the patient's EEG which did show some subclinical seizure activity and the plan is to start her on Keppra.  I encouraged him to follow-up with her family doctor to have her medications  reviewed and to be assessed for possible physical therapy or setting up the home for safety.  2:44 PM:  I have discussed the diagnosis/risks/treatment options with the patient and family and believe the pt to be eligible for discharge home to follow-up with PCP, neuro, heme/onc. We also discussed returning to the ED immediately if new or worsening sx occur. We discussed the sx which are most concerning (e.g., sudden worsening pain, fever, inability to tolerate by mouth) that necessitate immediate return. Medications administered to the patient during their visit and any new prescriptions provided to the patient are listed below.  Medications given during this visit Medications - No data to display   The patient appears reasonably screen and/or stabilized for discharge and I doubt any other medical condition or other [facility] requiring further screening, evaluation, or treatment in the ED at this time prior to discharge.   Final Clinical Impression(s) / ED Diagnoses Final diagnoses:  Fall, initial encounter  Ear lobe laceration, right, initial encounter    Rx / DC Orders ED Discharge Orders     None        Deno Etienne, DO 08/14/21 1444

## 2021-08-14 NOTE — Telephone Encounter (Signed)
Spoke with daughter [name], informed her that patient [name] [name] ambulatory EEG was abnormal showing bitemporal regions of epileptogenic potential.  Explained to her that due to the abnormal EEG and events of staring/confusion I will start her on levetiracetam 250 mg twice daily (low GFR).  I will follow her as scheduled and at that time we will get a Keppra level.  Advised her to contact me if patient had any additional seizures or seizure-like activity.  Alric Ran, MD

## 2021-08-14 NOTE — Progress Notes (Signed)
Spoke with daughter [name], informed her that patient ambulatory EEG was abnormal showing bitemporal regions of epileptogenic potential.  I will start her on levetiracetam 250 mg twice daily (low GFR) and see her in clinic as scheduled.  At that time we will obtain a Keppra level.  Alric Ran, MD

## 2021-08-14 NOTE — ED Notes (Signed)
Patient transported to CT 

## 2021-08-14 NOTE — Discharge Instructions (Signed)
Eat and drink as well as you can for the next 48 hours.  Please follow-up with your family doctor in the office.  You likely need to follow-up with your hematologist as well and discuss with them your continued issues with your cell counts.

## 2021-08-14 NOTE — ED Triage Notes (Signed)
Per pt and daughter pt fell 10/10 and injured back-daughter thinks pt fell again yesterday- injured right ear and behind right ear-pt states she does recall both falls and that she lost balance/fell in her closet putting on her clothes-daughter is concerned with pt's injuries and with fall x 2-pt NAD-slow steady gait

## 2021-08-22 ENCOUNTER — Other Ambulatory Visit: Payer: Medicare Other

## 2021-08-22 ENCOUNTER — Ambulatory Visit: Payer: Medicare Other | Admitting: Internal Medicine

## 2021-09-02 NOTE — Progress Notes (Signed)
[facility] OFFICE PROGRESS NOTE  [name], [place] [address]  DIAGNOSIS: Essential thrombocythemia with positive JAK2 mutation [id number] diagnosed in October 2020  PRIOR THERAPY: None  CURRENT THERAPY: Hydroxyurea 500 mg p.o. daily. First dose on 09/24/2019. Increased to 1000 mg p.o. daily of hydroxyurea on 10/05/2019. Reduced to 500 mg p.o. daily on 11/03/2019. Increased to 1000 mg on Monday, Wednesday, and Fridays and 500 mg on the other days of the week starting at her last appointment on 07/18/2021. Reduced to 500 mg p.o. daily on 09/05/21.   INTERVAL HISTORY: [name] [name] 75 y.o. female returns to the clinic today for a follow-up visit accompanied by her daughter.  The patient is feeling fairly well today without any concerning complaints. She was recently diagnosed with seizures and started on keppra. She did present to the emergency room on 08/14/2021 after a fall while getting dressed.  The patient was cleared for discharge. She also was diagnosed with COVID-19 about 2-3 weeks ago and recovered well. The patient is currently undergoing treatment for her ET with hydrea alternating between 1000 on Monday Wednesday, and Friday and 500 mg p.o. daily on the other days.  She tolerated this well without any adverse side effects.  She denies any abnormal bleeding or bruising besides some bruising after her fall.  She denies any current signs and symptoms of infection,  besides when she had COVID a few weeks ago when she had cough, congestion, and body aches. She denies current fever, chills, night sweats, sore throat, nasal congestion, skin infections, cough, dysuria, abdominal pain, or diarrhea. She denies any chest pain, shortness of breath, lightheadedness, chest pain or dyspnea on exertion.  She is here today for evaluation and repeat blood work.     MEDICAL HISTORY: Past Medical History:  Diagnosis Date   Anemia    Coronary artery disease    s/p  stent to LAD and LCx   DM II (diabetes mellitus, type II), controlled ([facility])    DVT (deep venous thrombosis) ([facility])    Elevated LFTs    Hyperlipidemia    Hypertension    Memory loss    Myocardial infarction ([place])    7 yrs ago    ALLERGIES:  is allergic to ampicillin, avelox [moxifloxacin hcl in nacl], bactrim [sulfamethoxazole-trimethoprim], ibuprofen, penicillins, codeine, latex, and whey.  MEDICATIONS:  Current Outpatient Medications  Medication Sig Dispense Refill   fenofibrate micronized (LOFIBRA) 200 MG capsule TAKE ONE CAPSULE BY MOUTH ONCE DAILY BEFORE BREAKFAST 90 capsule 2   ferrous sulfate 325 (65 FE) MG EC tablet Take 325 mg by mouth as needed.     Fish Oil-Cholecalciferol (FISH OIL + D3) 1000-1000 MG-UNIT CAPS Take 1 tablet by mouth 3 (three) times daily.     levETIRAcetam (KEPPRA) 250 MG tablet Take 1 tablet (250 mg total) by mouth 2 (two) times daily. 180 tablet 4   metFORMIN (GLUCOPHAGE) 500 MG tablet Take 500 mg by mouth 2 (two) times daily with a meal.     rivaroxaban (XARELTO) 20 MG TABS tablet Take 20 mg by mouth daily with supper.     rosuvastatin (CRESTOR) 20 MG tablet Take 10 mg by mouth daily.     Zinc 25 MG TABS Take 1 tablet by mouth 2 (two) times daily.     hydroxyurea (HYDREA) 500 MG capsule TAKE 1 CAPSULE BY MOUTH ONCE DAILY WITH FOOD TO  MINIMIZE  GI  SIDE  EFFECTS. 30 capsule 2   nitroGLYCERIN (  NITROSTAT) 0.4 MG SL tablet Place 1 tablet (0.4 mg total) under the tongue every 5 (five) minutes as needed. For chest pain. (Patient not taking: No sig reported) 25 tablet 0   prochlorperazine (COMPAZINE) 10 MG tablet Take 1 tablet (10 mg total) by mouth every 6 (six) hours as needed for nausea or vomiting. (Patient not taking: No sig reported) 30 tablet 2   No current facility-administered medications for this visit.   Facility-Administered Medications Ordered in Other Visits  Medication Dose Route Frequency Provider Last Rate Last Admin   influenza vaccine  adjuvanted (FLUAD) injection 0.5 mL  0.5 mL Intramuscular Once Axie Hayne L, PA-C        SURGICAL HISTORY:  Past Surgical History:  Procedure Laterality Date   CATARACT EXTRACTION     CESAREAN SECTION  1983   CORONARY ANGIOPLASTY WITH STENT PLACEMENT  04/26/2003   NORMAL. EF 65%   I & D EXTREMITY  06/22/2012   Procedure: IRRIGATION AND DEBRIDEMENT EXTREMITY;  Surgeon: Tennis Must, MD;  Location: [facility];  Service: Orthopedics;  Laterality: Left;   I & D EXTREMITY  06/29/2012   Procedure: IRRIGATION AND DEBRIDEMENT EXTREMITY;  Surgeon: Tennis Must, MD;  Location: [facility];  Service: Orthopedics;  Laterality: Left;   OPEN REDUCTION INTERNAL FIXATION (ORIF) DISTAL RADIAL FRACTURE Right 02/04/2018   Procedure: OPEN REDUCTION INTERNAL FIXATION (ORIF) RIGHT DISTAL RADIAL FRACTURE;  Surgeon: Leanora Cover, MD;  Location: [facility];  Service: Orthopedics;  Laterality: Right;    REVIEW OF SYSTEMS:   Review of Systems  Constitutional: Negative for appetite change, chills, fatigue, fever and unexpected weight change.  HENT: Negative for mouth sores, nosebleeds, sore throat and trouble swallowing.   Eyes: Negative for eye problems and icterus.  Respiratory: Negative for cough, hemoptysis, shortness of breath and wheezing.   Cardiovascular: Negative for chest pain and leg swelling.  Gastrointestinal: Negative for abdominal pain, constipation, diarrhea, nausea and vomiting.  Genitourinary: Negative for bladder incontinence, difficulty urinating, dysuria, frequency and hematuria.   Musculoskeletal: Negative for back pain, gait problem, neck pain and neck stiffness.  Skin: Negative for itching and rash.  Neurological: Negative for dizziness, extremity weakness, gait problem, headaches, light-headedness and seizures.  Hematological: Negative for adenopathy. Does not bruise/bleed easily.  Psychiatric/Behavioral: Negative for confusion, depression and sleep disturbance. The patient  is not nervous/anxious.     PHYSICAL EXAMINATION:  Blood pressure (!) 120/53, pulse 70, temperature (!) 97.2 F (36.2 C), resp. rate 17, weight 119 lb 9 oz (54.2 kg), SpO2 100 %.  ECOG PERFORMANCE STATUS: 1  Physical Exam  Constitutional: Oriented to person, place, and time and well-developed, well-nourished, and in no distress.   HENT:  Head: Normocephalic and atraumatic.  Mouth/Throat: Oropharynx is clear and moist. No oropharyngeal exudate.  Eyes: Conjunctivae are normal. Right eye exhibits no discharge. Left eye exhibits no discharge. No scleral icterus.  Neck: Normal range of motion. Neck supple.  Cardiovascular: Normal rate, regular rhythm, systolic murmur noted and intact distal pulses.   Pulmonary/Chest: Effort normal and breath sounds normal. No respiratory distress. No wheezes. No rales.  Abdominal: Soft. Bowel sounds are normal. Exhibits no distension and no mass. There is no tenderness.  Musculoskeletal: Normal range of motion. Exhibits no edema.  Lymphadenopathy:    No cervical adenopathy.  Neurological: Alert and oriented to person, place, and time. Exhibits normal muscle tone. Gait normal. Coordination normal.  Skin: Skin is warm and dry. No rash noted. Not diaphoretic. No erythema. No  pallor.  Psychiatric: Mood, memory and judgment normal.  Vitals reviewed.  LABORATORY DATA: Lab Results  Component Value Date   WBC 1.8 (L) 09/05/2021   HGB 9.0 (L) 09/05/2021   HCT 27.8 (L) 09/05/2021   MCV 111.6 (H) 09/05/2021   PLT 394 09/05/2021      Chemistry      Component Value Date/Time   NA 144 09/05/2021 0826   NA 141 01/29/2021 1107   K 4.0 09/05/2021 0826   CL 111 09/05/2021 0826   CO2 25 09/05/2021 0826   BUN 19 09/05/2021 0826   BUN 17 01/29/2021 1107   CREATININE 0.76 09/05/2021 0826      Component Value Date/Time   CALCIUM 8.7 (L) 09/05/2021 0826   ALKPHOS 40 09/05/2021 0826   AST 19 09/05/2021 0826   ALT 6 09/05/2021 0826   BILITOT 0.7 09/05/2021  0826       RADIOGRAPHIC STUDIES:  DG Ribs Unilateral W/Chest Left  Result Date: 08/14/2021 CLINICAL DATA:  Left rib pain after fall. EXAM: LEFT RIBS AND CHEST - 3+ VIEW COMPARISON:  June 29, 2012. FINDINGS: No fracture or other bone lesions are seen involving the ribs. There is no evidence of pneumothorax or pleural effusion. Both lungs are clear. Heart size and mediastinal contours are within normal limits. IMPRESSION: Negative. Electronically Signed   By: Marijo Conception M.D.   On: 08/14/2021 14:07   CT Head Wo Contrast  Result Date: 08/14/2021 CLINICAL DATA:  Minor head trauma.  Fall. EXAM: CT HEAD WITHOUT CONTRAST TECHNIQUE: Contiguous axial images were obtained from the base of the skull through the vertex without intravenous contrast. COMPARISON:  06/28/2012.  MRI head 06/05/2021 FINDINGS: Brain: No evidence of acute infarction, hemorrhage, hydrocephalus, extra-axial collection or mass lesion/mass effect. Mild atrophy and mild white matter hypodensity bilaterally, with progression from the prior study. Vascular: Negative for hyperdense vessel Skull: Negative Sinuses/Orbits: Mucosal edema paranasal sinuses.  Negative orbit Other: None IMPRESSION: No acute intracranial abnormality Mild atrophy and mild chronic microvascular ischemic change, with progression since 2013 Electronically Signed   By: Franchot Gallo M.D.   On: 08/14/2021 14:11   EEG adult  Result Date: 08/13/2021 Alric Ran, MD     08/13/2021  9:17 PM Technical Data: This RVEEG was performed using equipment provided by [employer] utilizing Bluetooth ( Trackit ) amplifiers with EEGT attended video collected and performed with a 19-channel digital VEEG, Data was acquired with a minimum of 21 bipolar connections and sampled at a minimum rate of 250 cycles per second per channel and one channel for EKG, obtained according to 10-20 international electrode placement system, reformatted digitally into referential and bipolar  montages, reviewed and verified for accuracy and validity by the governing reading neurologist in full details. Activating Procedure: Photic Stimulation was Performed. Photic Driving Response seen. Hyperventilation was not performed due to pt contraindication of Droplet Precaution-COVID-19. Notes for bi temporal sharp waves were detected by the reviewing neurodiagnostic technologist for further evaluation. Intermittent artifacts could not be resolved. All impedance under 10K ohms. Pt is awake, alert, cooperative and orientated Xs 4 of 4? correct. EEGT Attended Video Recording: Yes Hardware - (Trackit) by [place] Description: This RVEEG was obtained during awake and drowsy states, during wakefulness posterior dominant rhythm was 9-10 Hz, alpha, well-modulated, moderate in voltage, and attenuated appropriately with eye opening. During the recording there was no generalized or focal slowing noted. There were presence of bitemporal sharps. Drowsiness was marked by stage I  sleep. Hyperventilation was not performed due to pt contraindication of Droplet Precaution. Photic stimulation resulted in posterior driving responses at multiple frequencies, without any abnormal photo paroxysmal responses. EKG normal sinus rhythm.  RVEEG Interpretations:  This is an abnormal video routine EEG due to presence of bitemporal sharps. Bitemporal sharps are consistent with an area of epileptogenic potential in the bitemporal regions. Alric Ran, MD [facility]   AMBULATORY EEG  Result Date: 08/13/2021 Alric Ran, MD     08/13/2021  9:17 PM Description 24 Hours Ambulatory Video EEG TECHNICAL DESCRIPTION: This AVEEG was performed using equipment provided by [employer] utilizing Bluetooth (Trackit ) amplifiers with continuous EEGT attended video collection using encrypted remote transmission via [place] secured cellular tower network with data rates for each AVEEG performed.  This is a Biomedical engineer AVEEG, obtained, according to the 10-20 international electrode placement system, reformatted digitally into referential and bipolar montages. Data was acquired with a minimum of 21 bipolar connections and sampled at a minimum rate of 250 cycles per second per channel, maximum rate of 450 cycles per second per channel and two channels for EKG. The entire AVEEG study was recorded through cable and or radio telemetry for subsequent analysis. Specified epochs of the AVEEG data were identified at the direction of the subject by the depression of a push button by the patient. Each patient's event file included data acquired two minutes prior to the push button activation and continuing until two minutes afterwards. AVEEG files were reviewed on [employer] by [employer] with a digital high frequency filter set at 70 Hz and a low frequency filter set at 0.1 Hz with a paper speed of 28mm/s resulting in 10 seconds per digital page. This entire AVEEG was reviewed by the EEG Technologist. Random time samples, random sleep samples, clips, patient initiated push button files with included patient daily diary logs, EEG Technologist bookmarked note data was reviewed and verified for accuracy and validity by the governing reading neurologist in full details. This AEEGV was fully compliant with all requirements for CPT 97500 for setup, patient education, take down and administered by an EEG technologist. INTERMITTENT MONITORING WITH VIDEO TECHNICAL DESCRIPTION:  This Long-Term AVEEG was monitored intermittently by a qualified EEG technologist for the entirety of the recording; quality check-ins were performed at a minimum of every two hours, checking and documenting real-time data and video to assure the integrity and quality of the recording (e.g., camera position, electrode integrity and impedance), and identify the need for maintenance. For intermittent monitoring, an EEG Technologist monitored no  more than 12 patients concurrently. Diagnostic video was captured at least 80% of the time during the recording. TECHNOLOGIST EVENTS: Notes for BI temporal sharp waves with right temporal predominance were detected by the reviewing neurodiagnostic technologist during the recording for further evaluation. PATIENT EVENTS:  The patient pushed the event button 0 times during the AVEEG recording for further evaluation. However, EEGT test press #1 time with pt at the beginning of the tracing. TIME SAMPLES: 1 minute of every hour recorded are reviewed as random time samples. SLEEP SAMPLES: 5 minutes of every 24 hour recorded sleep cycle are reviewed as random sleep samples. BACKGROUND EEG: This AVEEG consists of well-modulated bilateral synchronous and symmetrical background in the alpha frequencies in the awake state. AWAKE: The posterior dominant rhythm was characterized by symmetric and reactive 9-10 Hz activity with eyes closed. SLEEP: Stage two sleep displayed Sleep Spindles and Vertex Waveform components. All other sleep stages appeared symmetrical and  synchronous with regards to K-Complexes, and REM. EKG: Normal sinus rhythm. AVEEG Interpretations: This is an abnormal video ambulatory EEG due to presence of bilateral right greater than left temporal regions epileptiform discharges. This EEG is consistent with bitemporal regions of epileptogenic potential right greater than left. Alric Ran, MD [facility]     ASSESSMENT/PLAN:  This is a very pleasant 75 years old Caucasian female diagnosed with essential thrombocythemia with positive JAK2 mutation.  The patient also has anemia and leukocytosis secondary to the myeloproliferative disorder. She was diagnosed in November of 2020 although she had evidence of this since approximately 2013-2014.     She is currently undergoing treatment with 500 mg p.o. daily or hydroxyurea. She started this on 09/24/2019 and is status post 1.5 weeks of  treatment. This was increased to 1000 mg daily starting on 10/05/2019.  This was switched back to 500 mg due to patient not being able to tolerate the higher dose.  When the patient was last seen in clinic on 07/18/2021, platelet count was elevated at 625K.  Dr. Julien Nordmann recommended starting her hydroxyurea at 1000 mg on Mondays, Wednesdays, and Fridays and 500 mg on the other day.  I reviewed the patient's labs with Dr. Julien Nordmann. Her ANC is 1.0 and total WBC is 1.8. Hgb 9.0. Plt count within normal limits at 394.   Dr. Julien Nordmann recommends that she reduce her dose to hydrea to 500 mg p.o. daily.   We will see her back for a follow up visit in 1 month for labs and follow up.   Per Dr. Julien Nordmann, she is ok to proceed with her flu vaccine today with an [place] of 1.0.    I have refilled her hydrea and sent the prescription to her pharmacy.   The patient was advised to call immediately if she has any concerning symptoms in the interval. The patient voices understanding of current disease status and treatment options and is in agreement with the current care plan. All questions were answered. The patient knows to call the clinic with any problems, questions or concerns. We can certainly see the patient much sooner if necessary      Orders Placed This Encounter  Procedures   CBC with Differential ([facility] Only)    Standing Status:   Future    Standing Expiration Date:   09/05/2022   CMP ([facility] only)    Standing Status:   Future    Standing Expiration Date:   09/05/2022   Lactate dehydrogenase (LDH)    Standing Status:   Future    Standing Expiration Date:   09/05/2022    The total time spent in the appointment was 20-29 minutes,.   Bralynn Donado L Jaiona Simien, PA-C 09/05/21

## 2021-09-05 ENCOUNTER — Encounter: Payer: Self-pay | Admitting: Physician Assistant

## 2021-09-05 ENCOUNTER — Other Ambulatory Visit: Payer: Self-pay

## 2021-09-05 ENCOUNTER — Inpatient Hospital Stay: Payer: Medicare Other | Attending: Internal Medicine

## 2021-09-05 ENCOUNTER — Inpatient Hospital Stay (HOSPITAL_BASED_OUTPATIENT_CLINIC_OR_DEPARTMENT_OTHER): Payer: Medicare Other | Admitting: Physician Assistant

## 2021-09-05 ENCOUNTER — Inpatient Hospital Stay: Payer: Medicare Other

## 2021-09-05 VITALS — BP 120/53 | HR 70 | Temp 97.2°F | Resp 17 | Wt 119.6 lb

## 2021-09-05 DIAGNOSIS — C946 Myelodysplastic disease, not classified: Secondary | ICD-10-CM | POA: Diagnosis not present

## 2021-09-05 DIAGNOSIS — D473 Essential (hemorrhagic) thrombocythemia: Secondary | ICD-10-CM | POA: Diagnosis not present

## 2021-09-05 DIAGNOSIS — I252 Old myocardial infarction: Secondary | ICD-10-CM | POA: Insufficient documentation

## 2021-09-05 DIAGNOSIS — I10 Essential (primary) hypertension: Secondary | ICD-10-CM | POA: Diagnosis not present

## 2021-09-05 DIAGNOSIS — I2510 Atherosclerotic heart disease of native coronary artery without angina pectoris: Secondary | ICD-10-CM | POA: Diagnosis not present

## 2021-09-05 DIAGNOSIS — Z23 Encounter for immunization: Secondary | ICD-10-CM | POA: Diagnosis not present

## 2021-09-05 DIAGNOSIS — Z5111 Encounter for antineoplastic chemotherapy: Secondary | ICD-10-CM

## 2021-09-05 DIAGNOSIS — Z8616 Personal history of COVID-19: Secondary | ICD-10-CM | POA: Diagnosis not present

## 2021-09-05 DIAGNOSIS — E785 Hyperlipidemia, unspecified: Secondary | ICD-10-CM | POA: Insufficient documentation

## 2021-09-05 DIAGNOSIS — E119 Type 2 diabetes mellitus without complications: Secondary | ICD-10-CM | POA: Insufficient documentation

## 2021-09-05 DIAGNOSIS — Z7984 Long term (current) use of oral hypoglycemic drugs: Secondary | ICD-10-CM | POA: Diagnosis not present

## 2021-09-05 DIAGNOSIS — Z7901 Long term (current) use of anticoagulants: Secondary | ICD-10-CM | POA: Insufficient documentation

## 2021-09-05 DIAGNOSIS — Z86718 Personal history of other venous thrombosis and embolism: Secondary | ICD-10-CM | POA: Diagnosis not present

## 2021-09-05 LAB — CBC WITH DIFFERENTIAL (CANCER CENTER ONLY)
Abs Immature Granulocytes: 0.01 10*3/uL (ref 0.00–0.07)
Basophils Absolute: 0 10*3/uL (ref 0.0–0.1)
Basophils Relative: 0 %
Eosinophils Absolute: 0 10*3/uL (ref 0.0–0.5)
Eosinophils Relative: 2 %
HCT: 27.8 % — ABNORMAL LOW (ref 36.0–46.0)
Hemoglobin: 9 g/dL — ABNORMAL LOW (ref 12.0–15.0)
Immature Granulocytes: 1 %
Lymphocytes Relative: 32 %
Lymphs Abs: 0.6 10*3/uL — ABNORMAL LOW (ref 0.7–4.0)
MCH: 36.1 pg — ABNORMAL HIGH (ref 26.0–34.0)
MCHC: 32.4 g/dL (ref 30.0–36.0)
MCV: 111.6 fL — ABNORMAL HIGH (ref 80.0–100.0)
Monocytes Absolute: 0.1 10*3/uL (ref 0.1–1.0)
Monocytes Relative: 8 %
Neutro Abs: 1 10*3/uL — ABNORMAL LOW (ref 1.7–7.7)
Neutrophils Relative %: 57 %
Platelet Count: 394 10*3/uL (ref 150–400)
RBC: 2.49 MIL/uL — ABNORMAL LOW (ref 3.87–5.11)
RDW: 16.5 % — ABNORMAL HIGH (ref 11.5–15.5)
WBC Count: 1.8 10*3/uL — ABNORMAL LOW (ref 4.0–10.5)
nRBC: 0 % (ref 0.0–0.2)

## 2021-09-05 LAB — CMP (CANCER CENTER ONLY)
ALT: 6 U/L (ref 0–44)
AST: 19 U/L (ref 15–41)
Albumin: 3.6 g/dL (ref 3.5–5.0)
Alkaline Phosphatase: 40 U/L (ref 38–126)
Anion gap: 8 (ref 5–15)
BUN: 19 mg/dL (ref 8–23)
CO2: 25 mmol/L (ref 22–32)
Calcium: 8.7 mg/dL — ABNORMAL LOW (ref 8.9–10.3)
Chloride: 111 mmol/L (ref 98–111)
Creatinine: 0.76 mg/dL (ref 0.44–1.00)
GFR, Estimated: >60 mL/min (ref >60)
Glucose, Bld: 95 mg/dL (ref 70–99)
Potassium: 4 mmol/L (ref 3.5–5.1)
Sodium: 144 mmol/L (ref 135–145)
Total Bilirubin: 0.7 mg/dL (ref 0.3–1.2)
Total Protein: 5.8 g/dL — ABNORMAL LOW (ref 6.5–8.1)

## 2021-09-05 LAB — LACTATE DEHYDROGENASE: LDH: 261 U/L — ABNORMAL HIGH (ref 98–192)

## 2021-09-05 MED ORDER — INFLUENZA VAC A&B SA ADJ QUAD 0.5 ML IM PRSY
0.5000 mL | PREFILLED_SYRINGE | Freq: Once | INTRAMUSCULAR | Status: AC
  Administered 2021-09-05: 0.5 mL via INTRAMUSCULAR
  Filled 2021-09-05: qty 0.5

## 2021-09-05 MED ORDER — HYDROXYUREA 500 MG PO CAPS: ORAL_CAPSULE | ORAL | 2 refills | Status: DC

## 2021-09-11 ENCOUNTER — Telehealth: Payer: Self-pay | Admitting: Neurology

## 2021-09-11 NOTE — Telephone Encounter (Signed)
I returned the call to patient's dgt on DPR. Recent abnormal EEG. Per [place] law, she should not drive until six months episode free. She should continue the prescribed medication. Six month follow up scheduled. Instructed to call with any further events.

## 2021-09-11 NOTE — Telephone Encounter (Signed)
Pt's daughter is asking for a call to discuss pt's driving restrictions while on levETIRAcetam (KEPPRA) 250 MG tablet

## 2021-10-09 ENCOUNTER — Inpatient Hospital Stay: Payer: Medicare Other | Attending: Internal Medicine

## 2021-10-09 ENCOUNTER — Other Ambulatory Visit: Payer: Self-pay

## 2021-10-09 ENCOUNTER — Inpatient Hospital Stay (HOSPITAL_BASED_OUTPATIENT_CLINIC_OR_DEPARTMENT_OTHER): Payer: Medicare Other | Admitting: Internal Medicine

## 2021-10-09 VITALS — BP 125/50 | HR 75 | Temp 97.9°F | Resp 18 | Ht 62.0 in | Wt 121.1 lb

## 2021-10-09 DIAGNOSIS — D473 Essential (hemorrhagic) thrombocythemia: Secondary | ICD-10-CM

## 2021-10-09 DIAGNOSIS — Z79899 Other long term (current) drug therapy: Secondary | ICD-10-CM | POA: Diagnosis not present

## 2021-10-09 DIAGNOSIS — R42 Dizziness and giddiness: Secondary | ICD-10-CM | POA: Insufficient documentation

## 2021-10-09 DIAGNOSIS — E119 Type 2 diabetes mellitus without complications: Secondary | ICD-10-CM | POA: Insufficient documentation

## 2021-10-09 DIAGNOSIS — Z86718 Personal history of other venous thrombosis and embolism: Secondary | ICD-10-CM | POA: Diagnosis not present

## 2021-10-09 DIAGNOSIS — D649 Anemia, unspecified: Secondary | ICD-10-CM | POA: Diagnosis not present

## 2021-10-09 DIAGNOSIS — Z7901 Long term (current) use of anticoagulants: Secondary | ICD-10-CM | POA: Insufficient documentation

## 2021-10-09 DIAGNOSIS — I252 Old myocardial infarction: Secondary | ICD-10-CM | POA: Insufficient documentation

## 2021-10-09 DIAGNOSIS — I10 Essential (primary) hypertension: Secondary | ICD-10-CM | POA: Diagnosis not present

## 2021-10-09 DIAGNOSIS — I2510 Atherosclerotic heart disease of native coronary artery without angina pectoris: Secondary | ICD-10-CM | POA: Insufficient documentation

## 2021-10-09 DIAGNOSIS — E785 Hyperlipidemia, unspecified: Secondary | ICD-10-CM | POA: Insufficient documentation

## 2021-10-09 LAB — CMP (CANCER CENTER ONLY)
ALT: 10 U/L (ref 0–44)
AST: 23 U/L (ref 15–41)
Albumin: 4 g/dL (ref 3.5–5.0)
Alkaline Phosphatase: 43 U/L (ref 38–126)
Anion gap: 11 (ref 5–15)
BUN: 16 mg/dL (ref 8–23)
CO2: 23 mmol/L (ref 22–32)
Calcium: 8.7 mg/dL — ABNORMAL LOW (ref 8.9–10.3)
Chloride: 108 mmol/L (ref 98–111)
Creatinine: 0.87 mg/dL (ref 0.44–1.00)
GFR, Estimated: >60 mL/min (ref >60)
Glucose, Bld: 135 mg/dL — ABNORMAL HIGH (ref 70–99)
Potassium: 4.1 mmol/L (ref 3.5–5.1)
Sodium: 142 mmol/L (ref 135–145)
Total Bilirubin: 0.4 mg/dL (ref 0.3–1.2)
Total Protein: 6.5 g/dL (ref 6.5–8.1)

## 2021-10-09 LAB — CBC WITH DIFFERENTIAL (CANCER CENTER ONLY)
Abs Immature Granulocytes: 0.01 10*3/uL (ref 0.00–0.07)
Basophils Absolute: 0 10*3/uL (ref 0.0–0.1)
Basophils Relative: 1 %
Eosinophils Absolute: 0 10*3/uL (ref 0.0–0.5)
Eosinophils Relative: 1 %
HCT: 29.6 % — ABNORMAL LOW (ref 36.0–46.0)
Hemoglobin: 9.5 g/dL — ABNORMAL LOW (ref 12.0–15.0)
Immature Granulocytes: 0 %
Lymphocytes Relative: 18 %
Lymphs Abs: 0.6 10*3/uL — ABNORMAL LOW (ref 0.7–4.0)
MCH: 35.8 pg — ABNORMAL HIGH (ref 26.0–34.0)
MCHC: 32.1 g/dL (ref 30.0–36.0)
MCV: 111.7 fL — ABNORMAL HIGH (ref 80.0–100.0)
Monocytes Absolute: 0.2 10*3/uL (ref 0.1–1.0)
Monocytes Relative: 5 %
Neutro Abs: 2.6 10*3/uL (ref 1.7–7.7)
Neutrophils Relative %: 75 %
Platelet Count: 675 10*3/uL — ABNORMAL HIGH (ref 150–400)
RBC: 2.65 MIL/uL — ABNORMAL LOW (ref 3.87–5.11)
RDW: 16.3 % — ABNORMAL HIGH (ref 11.5–15.5)
WBC Count: 3.5 10*3/uL — ABNORMAL LOW (ref 4.0–10.5)
nRBC: 0 % (ref 0.0–0.2)

## 2021-10-09 LAB — LACTATE DEHYDROGENASE: LDH: 442 U/L — ABNORMAL HIGH (ref 98–192)

## 2021-10-09 NOTE — Progress Notes (Signed)
[facility] [phone]   [phone]  OFFICE PROGRESS NOTE  [name], MD [place] [address]  DIAGNOSIS: Essential thrombocythemia with positive JAK2 mutation V617F diagnosed in October 2020.  PRIOR THERAPY: None  CURRENT THERAPY: Hydroxyurea 500 mg p.o. daily.  This will be alternating with 1000 mg on Mondays, Wednesday and Friday.  INTERVAL HISTORY: [name] [name] 75 y.o. female returns to the clinic today for follow-up visit accompanied by her daughter [name] [name].  The patient is feeling fine today with no concerning complaints except for occasional fatigue and dizzy spells.  She is currently on Keppra for questionable seizure activity.  She denied having any current chest pain, shortness of breath, cough or hemoptysis.  She denied having any fever or chills.  She has no nausea, vomiting, diarrhea or constipation.  She has no headache or visual changes.  She is here today for evaluation and repeat blood work.    MEDICAL HISTORY: Past Medical History:  Diagnosis Date   Anemia    Coronary artery disease    s/p stent to LAD and LCx   DM II (diabetes mellitus, type II), controlled ([facility] [place])    DVT (deep venous thrombosis) (HCC)    Elevated LFTs    Hyperlipidemia    Hypertension    Memory loss    Myocardial infarction ([place])    7 yrs ago    ALLERGIES:  is allergic to ampicillin, avelox [moxifloxacin hcl in nacl], bactrim [sulfamethoxazole-trimethoprim], ibuprofen, penicillins, sulfa antibiotics, codeine, latex, and whey.  MEDICATIONS:  Current Outpatient Medications  Medication Sig Dispense Refill   fenofibrate micronized (LOFIBRA) 200 MG capsule TAKE ONE CAPSULE BY MOUTH ONCE DAILY BEFORE BREAKFAST 90 capsule 2   ferrous sulfate 325 (65 FE) MG EC tablet Take 325 mg by mouth as needed.     Fish Oil-Cholecalciferol (FISH OIL + D3) 1000-1000 MG-UNIT CAPS Take 1 tablet by mouth 3 (three) times daily.     hydroxyurea (HYDREA)  500 MG capsule TAKE 1 CAPSULE BY MOUTH ONCE DAILY WITH FOOD TO  MINIMIZE  GI  SIDE  EFFECTS. 30 capsule 2   levETIRAcetam (KEPPRA) 250 MG tablet Take 1 tablet (250 mg total) by mouth 2 (two) times daily. 180 tablet 4   metFORMIN (GLUCOPHAGE) 500 MG tablet Take 500 mg by mouth 2 (two) times daily with a meal.     nitroGLYCERIN (NITROSTAT) 0.4 MG SL tablet Place 1 tablet (0.4 mg total) under the tongue every 5 (five) minutes as needed. For chest pain. (Patient not taking: No sig reported) 25 tablet 0   prochlorperazine (COMPAZINE) 10 MG tablet Take 1 tablet (10 mg total) by mouth every 6 (six) hours as needed for nausea or vomiting. (Patient not taking: No sig reported) 30 tablet 2   rivaroxaban (XARELTO) 20 MG TABS tablet Take 20 mg by mouth daily with supper.     rosuvastatin (CRESTOR) 20 MG tablet Take 10 mg by mouth daily.     Zinc 25 MG TABS Take 1 tablet by mouth 2 (two) times daily.     No current facility-administered medications for this visit.    SURGICAL HISTORY:  Past Surgical History:  Procedure Laterality Date   CATARACT EXTRACTION     CESAREAN SECTION  1983   CORONARY ANGIOPLASTY WITH STENT PLACEMENT  04/26/2003   NORMAL. EF 65%   I & D EXTREMITY  06/22/2012   Procedure: IRRIGATION AND DEBRIDEMENT EXTREMITY;  Surgeon: Tennis Must, MD;  Location: [facility];  Service: Orthopedics;  Laterality: Left;   I & D EXTREMITY  06/29/2012   Procedure: IRRIGATION AND DEBRIDEMENT EXTREMITY;  Surgeon: Tennis Must, MD;  Location: [facility];  Service: Orthopedics;  Laterality: Left;   OPEN REDUCTION INTERNAL FIXATION (ORIF) DISTAL RADIAL FRACTURE Right 02/04/2018   Procedure: OPEN REDUCTION INTERNAL FIXATION (ORIF) RIGHT DISTAL RADIAL FRACTURE;  Surgeon: Leanora Cover, MD;  Location: [facility];  Service: Orthopedics;  Laterality: Right;    REVIEW OF SYSTEMS:  A comprehensive review of systems was negative except for: Constitutional: positive for fatigue Neurological: positive for  dizziness   PHYSICAL EXAMINATION: General appearance: alert, cooperative, fatigued, and no distress Head: Normocephalic, without obvious abnormality, atraumatic Neck: no adenopathy, no JVD, supple, symmetrical, trachea midline, and thyroid not enlarged, symmetric, no tenderness/mass/nodules Lymph nodes: Cervical, supraclavicular, and axillary nodes normal. Resp: clear to auscultation bilaterally Back: symmetric, no curvature. ROM normal. No CVA tenderness. Cardio: regular rate and rhythm, S1, S2 normal, no murmur, click, rub or gallop GI: soft, non-tender; bowel sounds normal; no masses,  no organomegaly Extremities: extremities normal, atraumatic, no cyanosis or edema  ECOG PERFORMANCE STATUS: 1 - Symptomatic but completely ambulatory  Blood pressure (!) 125/50, pulse 75, temperature 97.9 F (36.6 C), temperature source Tympanic, resp. rate 18, height 5\' 2"  (1.575 m), weight 121 lb 1.6 oz (54.9 kg), SpO2 100 %.  LABORATORY DATA: Lab Results  Component Value Date   WBC 3.5 (L) 10/09/2021   HGB 9.5 (L) 10/09/2021   HCT 29.6 (L) 10/09/2021   MCV 111.7 (H) 10/09/2021   PLT 675 (H) 10/09/2021      Chemistry      Component Value Date/Time   NA 144 09/05/2021 0826   NA 141 01/29/2021 1107   K 4.0 09/05/2021 0826   CL 111 09/05/2021 0826   CO2 25 09/05/2021 0826   BUN 19 09/05/2021 0826   BUN 17 01/29/2021 1107   CREATININE 0.76 09/05/2021 0826      Component Value Date/Time   CALCIUM 8.7 (L) 09/05/2021 0826   ALKPHOS 40 09/05/2021 0826   AST 19 09/05/2021 0826   ALT 6 09/05/2021 0826   BILITOT 0.7 09/05/2021 0826       RADIOGRAPHIC STUDIES: No results found.  ASSESSMENT AND PLAN: This is a very pleasant 75 years old white female recently diagnosed with essential thrombocythemia with positive JAK2 mutation.  The patient also has anemia and leukocytosis secondary to the myeloproliferative disorder. She is current on treatment with hydroxyurea 500 mg p.o. daily and  tolerating it well. Repeat CBC today showed improvement of her total white blood count to 3.5 and also improvement of her hemoglobin to 9.5 but there was significant increase in the platelet count up to 675,000. I recommended for the patient to increase the dose of hydroxyurea to 1000 mg p.o. on Monday, Wednesday and Friday and 500 mg on the other days. I will see her back for follow-up visit in 1 months for evaluation and repeat blood work. She was advised to call immediately if she has any other concerning symptoms in the interval.  The patient voices understanding of current disease status and treatment options and is in agreement with the current care plan.  All questions were answered. The patient knows to call the clinic with any problems, questions or concerns. We can certainly see the patient much sooner if necessary.  Disclaimer: This note was dictated with voice recognition software. Similar sounding words can inadvertently be transcribed  and may not be corrected upon review.

## 2021-10-11 ENCOUNTER — Telehealth: Payer: Self-pay | Admitting: Internal Medicine

## 2021-10-11 NOTE — Telephone Encounter (Signed)
Sch per 12/9 los, pt daughter aware

## 2021-11-08 DIAGNOSIS — I2510 Atherosclerotic heart disease of native coronary artery without angina pectoris: Secondary | ICD-10-CM | POA: Diagnosis not present

## 2021-11-08 DIAGNOSIS — E785 Hyperlipidemia, unspecified: Secondary | ICD-10-CM | POA: Diagnosis not present

## 2021-11-08 DIAGNOSIS — Z7901 Long term (current) use of anticoagulants: Secondary | ICD-10-CM | POA: Diagnosis not present

## 2021-11-08 DIAGNOSIS — I119 Hypertensive heart disease without heart failure: Secondary | ICD-10-CM | POA: Diagnosis not present

## 2021-11-08 DIAGNOSIS — M8580 Other specified disorders of bone density and structure, unspecified site: Secondary | ICD-10-CM | POA: Diagnosis not present

## 2021-11-08 DIAGNOSIS — R42 Dizziness and giddiness: Secondary | ICD-10-CM | POA: Diagnosis not present

## 2021-11-08 DIAGNOSIS — Z86718 Personal history of other venous thrombosis and embolism: Secondary | ICD-10-CM | POA: Diagnosis not present

## 2021-11-08 DIAGNOSIS — G40909 Epilepsy, unspecified, not intractable, without status epilepticus: Secondary | ICD-10-CM | POA: Diagnosis not present

## 2021-11-08 DIAGNOSIS — E1165 Type 2 diabetes mellitus with hyperglycemia: Secondary | ICD-10-CM | POA: Diagnosis not present

## 2021-11-08 DIAGNOSIS — F329 Major depressive disorder, single episode, unspecified: Secondary | ICD-10-CM | POA: Diagnosis not present

## 2021-11-08 DIAGNOSIS — D8989 Other specified disorders involving the immune mechanism, not elsewhere classified: Secondary | ICD-10-CM | POA: Diagnosis not present

## 2021-11-08 DIAGNOSIS — D473 Essential (hemorrhagic) thrombocythemia: Secondary | ICD-10-CM | POA: Diagnosis not present

## 2021-11-12 ENCOUNTER — Encounter: Payer: Self-pay | Admitting: Internal Medicine

## 2021-11-12 ENCOUNTER — Inpatient Hospital Stay: Payer: Medicare Other | Attending: Internal Medicine

## 2021-11-12 ENCOUNTER — Inpatient Hospital Stay (HOSPITAL_BASED_OUTPATIENT_CLINIC_OR_DEPARTMENT_OTHER): Payer: Medicare Other | Admitting: Internal Medicine

## 2021-11-12 ENCOUNTER — Other Ambulatory Visit: Payer: Self-pay

## 2021-11-12 VITALS — BP 128/57 | HR 66 | Temp 97.8°F | Resp 17 | Wt 120.0 lb

## 2021-11-12 DIAGNOSIS — D75839 Thrombocytosis, unspecified: Secondary | ICD-10-CM | POA: Insufficient documentation

## 2021-11-12 DIAGNOSIS — D649 Anemia, unspecified: Secondary | ICD-10-CM | POA: Diagnosis not present

## 2021-11-12 DIAGNOSIS — D473 Essential (hemorrhagic) thrombocythemia: Secondary | ICD-10-CM | POA: Diagnosis not present

## 2021-11-12 DIAGNOSIS — D72829 Elevated white blood cell count, unspecified: Secondary | ICD-10-CM | POA: Diagnosis not present

## 2021-11-12 DIAGNOSIS — I10 Essential (primary) hypertension: Secondary | ICD-10-CM | POA: Insufficient documentation

## 2021-11-12 DIAGNOSIS — D471 Chronic myeloproliferative disease: Secondary | ICD-10-CM | POA: Insufficient documentation

## 2021-11-12 DIAGNOSIS — E119 Type 2 diabetes mellitus without complications: Secondary | ICD-10-CM | POA: Diagnosis not present

## 2021-11-12 DIAGNOSIS — Z5111 Encounter for antineoplastic chemotherapy: Secondary | ICD-10-CM

## 2021-11-12 LAB — LACTATE DEHYDROGENASE: LDH: 261 U/L — ABNORMAL HIGH (ref 98–192)

## 2021-11-12 LAB — CBC WITH DIFFERENTIAL (CANCER CENTER ONLY)
Abs Immature Granulocytes: 0.01 10*3/uL (ref 0.00–0.07)
Basophils Absolute: 0 10*3/uL (ref 0.0–0.1)
Basophils Relative: 1 %
Eosinophils Absolute: 0 10*3/uL (ref 0.0–0.5)
Eosinophils Relative: 1 %
HCT: 26.6 % — ABNORMAL LOW (ref 36.0–46.0)
Hemoglobin: 8.2 g/dL — ABNORMAL LOW (ref 12.0–15.0)
Immature Granulocytes: 1 %
Lymphocytes Relative: 20 %
Lymphs Abs: 0.4 10*3/uL — ABNORMAL LOW (ref 0.7–4.0)
MCH: 34.6 pg — ABNORMAL HIGH (ref 26.0–34.0)
MCHC: 30.8 g/dL (ref 30.0–36.0)
MCV: 112.2 fL — ABNORMAL HIGH (ref 80.0–100.0)
Monocytes Absolute: 0.1 10*3/uL (ref 0.1–1.0)
Monocytes Relative: 6 %
Neutro Abs: 1.6 10*3/uL — ABNORMAL LOW (ref 1.7–7.7)
Neutrophils Relative %: 71 %
Platelet Count: 447 10*3/uL — ABNORMAL HIGH (ref 150–400)
RBC: 2.37 MIL/uL — ABNORMAL LOW (ref 3.87–5.11)
RDW: 17.5 % — ABNORMAL HIGH (ref 11.5–15.5)
WBC Count: 2.1 10*3/uL — ABNORMAL LOW (ref 4.0–10.5)
nRBC: 0 % (ref 0.0–0.2)

## 2021-11-12 LAB — CMP (CANCER CENTER ONLY)
ALT: 82 U/L — ABNORMAL HIGH (ref 0–44)
AST: 55 U/L — ABNORMAL HIGH (ref 15–41)
Albumin: 3.9 g/dL (ref 3.5–5.0)
Alkaline Phosphatase: 71 U/L (ref 38–126)
Anion gap: 5 (ref 5–15)
BUN: 19 mg/dL (ref 8–23)
CO2: 27 mmol/L (ref 22–32)
Calcium: 9 mg/dL (ref 8.9–10.3)
Chloride: 110 mmol/L (ref 98–111)
Creatinine: 0.83 mg/dL (ref 0.44–1.00)
GFR, Estimated: >60 mL/min (ref >60)
Glucose, Bld: 160 mg/dL — ABNORMAL HIGH (ref 70–99)
Potassium: 4 mmol/L (ref 3.5–5.1)
Sodium: 142 mmol/L (ref 135–145)
Total Bilirubin: 0.6 mg/dL (ref 0.3–1.2)
Total Protein: 5.9 g/dL — ABNORMAL LOW (ref 6.5–8.1)

## 2021-11-12 NOTE — Progress Notes (Signed)
[facility] [phone]   [phone]) [phone]  OFFICE PROGRESS NOTE  [name], MD [place] [address]  DIAGNOSIS: Essential thrombocythemia with positive JAK2 mutation V617F diagnosed in October 2020.  PRIOR THERAPY: None  CURRENT THERAPY: Hydroxyurea 500 mg p.o. daily.    INTERVAL HISTORY: [name] [name] 76 y.o. female returns to the clinic today for follow-up visit accompanied by her daughter [name] [name].  The patient is feeling fine today with no concerning complaints except for mild fatigue.  She denied having any bleeding, bruises or ecchymosis.  She denied having any nausea, vomiting, diarrhea or constipation.  She has no chest pain, shortness of breath, cough or hemoptysis.  She has been on treatment with hydroxyurea 500 mg daily alternating with 1000 mg on Monday, Wednesday and Friday every week.  She has been tolerating this treatment well.  She is here today for evaluation and repeat blood work.    MEDICAL HISTORY: Past Medical History:  Diagnosis Date   Anemia    Coronary artery disease    s/p stent to LAD and LCx   DM II (diabetes mellitus, type II), controlled ([facility])    DVT (deep venous thrombosis) (HCC)    Elevated LFTs    Hyperlipidemia    Hypertension    Memory loss    Myocardial infarction ([facility])    7 yrs ago    ALLERGIES:  is allergic to ampicillin, avelox [moxifloxacin hcl in nacl], bactrim [sulfamethoxazole-trimethoprim], ibuprofen, penicillins, sulfa antibiotics, codeine, latex, and whey.  MEDICATIONS:  Current Outpatient Medications  Medication Sig Dispense Refill   fenofibrate micronized (LOFIBRA) 200 MG capsule TAKE ONE CAPSULE BY MOUTH ONCE DAILY BEFORE BREAKFAST 90 capsule 2   ferrous sulfate 325 (65 FE) MG EC tablet Take 325 mg by mouth as needed.     Fish Oil-Cholecalciferol (FISH OIL + D3) 1000-1000 MG-UNIT CAPS Take 1 tablet by mouth 3 (three) times daily.     hydroxyurea (HYDREA) 500 MG capsule TAKE  1 CAPSULE BY MOUTH ONCE DAILY WITH FOOD TO  MINIMIZE  GI  SIDE  EFFECTS. 30 capsule 2   levETIRAcetam (KEPPRA) 250 MG tablet Take 1 tablet (250 mg total) by mouth 2 (two) times daily. 180 tablet 4   metFORMIN (GLUCOPHAGE) 500 MG tablet Take 500 mg by mouth 2 (two) times daily with a meal.     rosuvastatin (CRESTOR) 20 MG tablet Take 10 mg by mouth daily.     warfarin (COUMADIN) 2 MG tablet Take 2 mg by mouth daily.     Zinc 25 MG TABS Take 1 tablet by mouth 2 (two) times daily.     nitroGLYCERIN (NITROSTAT) 0.4 MG SL tablet Place 1 tablet (0.4 mg total) under the tongue every 5 (five) minutes as needed. For chest pain. (Patient not taking: Reported on 07/18/2021) 25 tablet 0   prochlorperazine (COMPAZINE) 10 MG tablet Take 1 tablet (10 mg total) by mouth every 6 (six) hours as needed for nausea or vomiting. (Patient not taking: Reported on 07/18/2021) 30 tablet 2   No current facility-administered medications for this visit.    SURGICAL HISTORY:  Past Surgical History:  Procedure Laterality Date   CATARACT EXTRACTION     CESAREAN SECTION  1983   CORONARY ANGIOPLASTY WITH STENT PLACEMENT  04/26/2003   NORMAL. EF 65%   I & D EXTREMITY  06/22/2012   Procedure: IRRIGATION AND DEBRIDEMENT EXTREMITY;  Surgeon: Tennis Must, MD;  Location: [facility];  Service:  Orthopedics;  Laterality: Left;   I & D EXTREMITY  06/29/2012   Procedure: IRRIGATION AND DEBRIDEMENT EXTREMITY;  Surgeon: Tennis Must, MD;  Location: [facility];  Service: Orthopedics;  Laterality: Left;   OPEN REDUCTION INTERNAL FIXATION (ORIF) DISTAL RADIAL FRACTURE Right 02/04/2018   Procedure: OPEN REDUCTION INTERNAL FIXATION (ORIF) RIGHT DISTAL RADIAL FRACTURE;  Surgeon: Leanora Cover, MD;  Location: [facility];  Service: Orthopedics;  Laterality: Right;    REVIEW OF SYSTEMS:  A comprehensive review of systems was negative except for: Constitutional: positive for fatigue   PHYSICAL EXAMINATION: General appearance: alert,  cooperative, fatigued, and no distress Head: Normocephalic, without obvious abnormality, atraumatic Neck: no adenopathy, no JVD, supple, symmetrical, trachea midline, and thyroid not enlarged, symmetric, no tenderness/mass/nodules Lymph nodes: Cervical, supraclavicular, and axillary nodes normal. Resp: clear to auscultation bilaterally Back: symmetric, no curvature. ROM normal. No CVA tenderness. Cardio: regular rate and rhythm, S1, S2 normal, no murmur, click, rub or gallop GI: soft, non-tender; bowel sounds normal; no masses,  no organomegaly Extremities: extremities normal, atraumatic, no cyanosis or edema  ECOG PERFORMANCE STATUS: 1 - Symptomatic but completely ambulatory  Blood pressure (!) 128/57, pulse 66, temperature 97.8 F (36.6 C), temperature source Oral, resp. rate 17, weight 120 lb (54.4 kg), SpO2 100 %.  LABORATORY DATA: Lab Results  Component Value Date   WBC 2.1 (L) 11/12/2021   HGB 8.2 (L) 11/12/2021   HCT 26.6 (L) 11/12/2021   MCV 112.2 (H) 11/12/2021   PLT 447 (H) 11/12/2021      Chemistry      Component Value Date/Time   NA 142 11/12/2021 1049   NA 141 01/29/2021 1107   K 4.0 11/12/2021 1049   CL 110 11/12/2021 1049   CO2 27 11/12/2021 1049   BUN 19 11/12/2021 1049   BUN 17 01/29/2021 1107   CREATININE 0.83 11/12/2021 1049      Component Value Date/Time   CALCIUM 9.0 11/12/2021 1049   ALKPHOS 71 11/12/2021 1049   AST 55 (H) 11/12/2021 1049   ALT 82 (H) 11/12/2021 1049   BILITOT 0.6 11/12/2021 1049       RADIOGRAPHIC STUDIES: No results found.  ASSESSMENT AND PLAN: This is a very pleasant 76 years old white female recently diagnosed with essential thrombocythemia with positive JAK2 mutation.  The patient also has anemia and leukocytosis secondary to the myeloproliferative disorder. She is current on treatment with hydroxyurea 500 mg p.o. daily and tolerating it well. CBC today showed further decline in the total white blood count to 2.1,  hemoglobin 8.2 and hematocrit 26.6% with platelets count 447,000. I recommended for the patient to reduce the dose of hydroxyurea to 500 mg p.o. daily and instead of the alternating schedule that she has been doing last months. I will see her back for follow-up visit in 1 months for evaluation and repeat CBC. She was advised to call immediately if she has any other concerning symptoms in the interval. I recommended for the patient to increase the dose of hydroxyurea to 1000 mg p.o. on Monday, Wednesday and Friday and 500 mg on the other days. I will see her back for follow-up visit in 1 months for evaluation and repeat blood work. She was advised to call immediately if she has any other concerning symptoms in the interval. The patient voices understanding of current disease status and treatment options and is in agreement with the current care plan.  All questions were answered. The patient knows to call the  clinic with any problems, questions or concerns. We can certainly see the patient much sooner if necessary.  Disclaimer: This note was dictated with voice recognition software. Similar sounding words can inadvertently be transcribed and may not be corrected upon review.

## 2021-11-18 DIAGNOSIS — Z86718 Personal history of other venous thrombosis and embolism: Secondary | ICD-10-CM | POA: Diagnosis not present

## 2021-11-18 DIAGNOSIS — D473 Essential (hemorrhagic) thrombocythemia: Secondary | ICD-10-CM | POA: Diagnosis not present

## 2021-11-18 DIAGNOSIS — Z7901 Long term (current) use of anticoagulants: Secondary | ICD-10-CM | POA: Diagnosis not present

## 2021-12-02 DIAGNOSIS — Z86718 Personal history of other venous thrombosis and embolism: Secondary | ICD-10-CM | POA: Diagnosis not present

## 2021-12-02 DIAGNOSIS — D473 Essential (hemorrhagic) thrombocythemia: Secondary | ICD-10-CM | POA: Diagnosis not present

## 2021-12-02 DIAGNOSIS — Z7901 Long term (current) use of anticoagulants: Secondary | ICD-10-CM | POA: Diagnosis not present

## 2021-12-12 DIAGNOSIS — Z7901 Long term (current) use of anticoagulants: Secondary | ICD-10-CM | POA: Diagnosis not present

## 2021-12-12 DIAGNOSIS — D473 Essential (hemorrhagic) thrombocythemia: Secondary | ICD-10-CM | POA: Diagnosis not present

## 2021-12-12 DIAGNOSIS — Z86718 Personal history of other venous thrombosis and embolism: Secondary | ICD-10-CM | POA: Diagnosis not present

## 2021-12-13 ENCOUNTER — Other Ambulatory Visit: Payer: Self-pay

## 2021-12-13 DIAGNOSIS — D473 Essential (hemorrhagic) thrombocythemia: Secondary | ICD-10-CM

## 2021-12-16 ENCOUNTER — Inpatient Hospital Stay (HOSPITAL_BASED_OUTPATIENT_CLINIC_OR_DEPARTMENT_OTHER): Payer: Medicare Other | Admitting: Internal Medicine

## 2021-12-16 ENCOUNTER — Inpatient Hospital Stay: Payer: Medicare Other | Attending: Internal Medicine

## 2021-12-16 ENCOUNTER — Other Ambulatory Visit: Payer: Self-pay

## 2021-12-16 VITALS — BP 127/48 | HR 68 | Temp 98.3°F | Resp 18 | Ht 62.0 in | Wt 118.0 lb

## 2021-12-16 DIAGNOSIS — D471 Chronic myeloproliferative disease: Secondary | ICD-10-CM | POA: Insufficient documentation

## 2021-12-16 DIAGNOSIS — Z5111 Encounter for antineoplastic chemotherapy: Secondary | ICD-10-CM

## 2021-12-16 DIAGNOSIS — D72829 Elevated white blood cell count, unspecified: Secondary | ICD-10-CM | POA: Insufficient documentation

## 2021-12-16 DIAGNOSIS — D649 Anemia, unspecified: Secondary | ICD-10-CM | POA: Insufficient documentation

## 2021-12-16 DIAGNOSIS — D75839 Thrombocytosis, unspecified: Secondary | ICD-10-CM | POA: Insufficient documentation

## 2021-12-16 DIAGNOSIS — D473 Essential (hemorrhagic) thrombocythemia: Secondary | ICD-10-CM

## 2021-12-16 DIAGNOSIS — E119 Type 2 diabetes mellitus without complications: Secondary | ICD-10-CM | POA: Diagnosis not present

## 2021-12-16 DIAGNOSIS — I10 Essential (primary) hypertension: Secondary | ICD-10-CM | POA: Diagnosis not present

## 2021-12-16 LAB — CBC WITH DIFFERENTIAL (CANCER CENTER ONLY)
Abs Immature Granulocytes: 0.02 10*3/uL (ref 0.00–0.07)
Basophils Absolute: 0 10*3/uL (ref 0.0–0.1)
Basophils Relative: 1 %
Eosinophils Absolute: 0 10*3/uL (ref 0.0–0.5)
Eosinophils Relative: 1 %
HCT: 27.3 % — ABNORMAL LOW (ref 36.0–46.0)
Hemoglobin: 8.8 g/dL — ABNORMAL LOW (ref 12.0–15.0)
Immature Granulocytes: 1 %
Lymphocytes Relative: 19 %
Lymphs Abs: 0.6 10*3/uL — ABNORMAL LOW (ref 0.7–4.0)
MCH: 35.1 pg — ABNORMAL HIGH (ref 26.0–34.0)
MCHC: 32.2 g/dL (ref 30.0–36.0)
MCV: 108.8 fL — ABNORMAL HIGH (ref 80.0–100.0)
Monocytes Absolute: 0.2 10*3/uL (ref 0.1–1.0)
Monocytes Relative: 5 %
Neutro Abs: 2.5 10*3/uL (ref 1.7–7.7)
Neutrophils Relative %: 73 %
Platelet Count: 591 10*3/uL — ABNORMAL HIGH (ref 150–400)
RBC: 2.51 MIL/uL — ABNORMAL LOW (ref 3.87–5.11)
RDW: 17.7 % — ABNORMAL HIGH (ref 11.5–15.5)
WBC Count: 3.3 10*3/uL — ABNORMAL LOW (ref 4.0–10.5)
nRBC: 0 % (ref 0.0–0.2)

## 2021-12-16 LAB — CMP (CANCER CENTER ONLY)
ALT: 12 U/L (ref 0–44)
AST: 19 U/L (ref 15–41)
Albumin: 4.1 g/dL (ref 3.5–5.0)
Alkaline Phosphatase: 39 U/L (ref 38–126)
Anion gap: 3 — ABNORMAL LOW (ref 5–15)
BUN: 20 mg/dL (ref 8–23)
CO2: 30 mmol/L (ref 22–32)
Calcium: 9.1 mg/dL (ref 8.9–10.3)
Chloride: 107 mmol/L (ref 98–111)
Creatinine: 0.89 mg/dL (ref 0.44–1.00)
GFR, Estimated: >60 mL/min (ref >60)
Glucose, Bld: 93 mg/dL (ref 70–99)
Potassium: 4.2 mmol/L (ref 3.5–5.1)
Sodium: 140 mmol/L (ref 135–145)
Total Bilirubin: 0.5 mg/dL (ref 0.3–1.2)
Total Protein: 6.1 g/dL — ABNORMAL LOW (ref 6.5–8.1)

## 2021-12-16 LAB — LACTATE DEHYDROGENASE: LDH: 295 U/L — ABNORMAL HIGH (ref 98–192)

## 2021-12-16 NOTE — Progress Notes (Signed)
[facility] Telephone:(336) [phone]   Fax:(336) [phone]  OFFICE PROGRESS NOTE  [name] Baton, MD [name] [address]  DIAGNOSIS: Essential thrombocythemia with positive JAK2 mutation V617F diagnosed in October 2020.  PRIOR THERAPY: None  CURRENT THERAPY: Hydroxyurea 500 mg p.o. daily alternating with 1000 mg p.o. on Monday, Wednesday and Friday.  INTERVAL HISTORY: [name] [name] 76 y.o. female returns to the clinic today for follow-up visit accompanied by her daughter [name] [name].  The patient is feeling fine today with no concerning complaints.  She denied having any fatigue or weakness.  She denied having any nausea, vomiting, diarrhea or constipation.  She has no headache or visual changes.  She has no bleeding, bruises or ecchymosis.  She has occasional dizzy spells especially when she bends over.  She continues to tolerate her treatment with hydroxyurea fairly well.  The patient is here today for evaluation and repeat blood work.    MEDICAL HISTORY: Past Medical History:  Diagnosis Date   Anemia    Coronary artery disease    s/p stent to LAD and LCx   DM II (diabetes mellitus, type II), controlled ([facility])    DVT (deep venous thrombosis) (HCC)    Elevated LFTs    Hyperlipidemia    Hypertension    Memory loss    Myocardial infarction ([place])    7 yrs ago    ALLERGIES:  is allergic to ampicillin, avelox [moxifloxacin hcl in nacl], bactrim [sulfamethoxazole-trimethoprim], ibuprofen, penicillins, sulfa antibiotics, codeine, latex, and whey.  MEDICATIONS:  Current Outpatient Medications  Medication Sig Dispense Refill   fenofibrate micronized (LOFIBRA) 200 MG capsule TAKE ONE CAPSULE BY MOUTH ONCE DAILY BEFORE BREAKFAST 90 capsule 2   ferrous sulfate 325 (65 FE) MG EC tablet Take 325 mg by mouth as needed.     Fish Oil-Cholecalciferol (FISH OIL + D3) 1000-1000 MG-UNIT CAPS Take 1 tablet by mouth 3 (three) times daily.     hydroxyurea (HYDREA)  500 MG capsule TAKE 1 CAPSULE BY MOUTH ONCE DAILY WITH FOOD TO  MINIMIZE  GI  SIDE  EFFECTS. 30 capsule 2   metFORMIN (GLUCOPHAGE) 500 MG tablet Take 500 mg by mouth 2 (two) times daily with a meal.     nitroGLYCERIN (NITROSTAT) 0.4 MG SL tablet Place 1 tablet (0.4 mg total) under the tongue every 5 (five) minutes as needed. For chest pain. 25 tablet 0   prochlorperazine (COMPAZINE) 10 MG tablet Take 1 tablet (10 mg total) by mouth every 6 (six) hours as needed for nausea or vomiting. 30 tablet 2   rosuvastatin (CRESTOR) 20 MG tablet Take 10 mg by mouth daily.     warfarin (COUMADIN) 2 MG tablet Take 2 mg by mouth daily.     Zinc 25 MG TABS Take 1 tablet by mouth 2 (two) times daily.     levETIRAcetam (KEPPRA) 250 MG tablet Take 1 tablet (250 mg total) by mouth 2 (two) times daily. 180 tablet 4   No current facility-administered medications for this visit.    SURGICAL HISTORY:  Past Surgical History:  Procedure Laterality Date   CATARACT EXTRACTION     CESAREAN SECTION  1983   CORONARY ANGIOPLASTY WITH STENT PLACEMENT  04/26/2003   NORMAL. EF 65%   I & D EXTREMITY  06/22/2012   Procedure: IRRIGATION AND DEBRIDEMENT EXTREMITY;  Surgeon: Tennis Must, MD;  Location: [facility];  Service: Orthopedics;  Laterality: Left;   I & D EXTREMITY  06/29/2012  Procedure: IRRIGATION AND DEBRIDEMENT EXTREMITY;  Surgeon: Tennis Must, MD;  Location: [facility];  Service: Orthopedics;  Laterality: Left;   OPEN REDUCTION INTERNAL FIXATION (ORIF) DISTAL RADIAL FRACTURE Right 02/04/2018   Procedure: OPEN REDUCTION INTERNAL FIXATION (ORIF) RIGHT DISTAL RADIAL FRACTURE;  Surgeon: Leanora Cover, MD;  Location: [facility];  Service: Orthopedics;  Laterality: Right;    REVIEW OF SYSTEMS:  A comprehensive review of systems was negative except for: Constitutional: positive for fatigue Neurological: positive for dizziness   PHYSICAL EXAMINATION: General appearance: alert, cooperative, fatigued, and no  distress Head: Normocephalic, without obvious abnormality, atraumatic Neck: no adenopathy, no JVD, supple, symmetrical, trachea midline, and thyroid not enlarged, symmetric, no tenderness/mass/nodules Lymph nodes: Cervical, supraclavicular, and axillary nodes normal. Resp: clear to auscultation bilaterally Back: symmetric, no curvature. ROM normal. No CVA tenderness. Cardio: regular rate and rhythm, S1, S2 normal, no murmur, click, rub or gallop GI: soft, non-tender; bowel sounds normal; no masses,  no organomegaly Extremities: extremities normal, atraumatic, no cyanosis or edema  ECOG PERFORMANCE STATUS: 1 - Symptomatic but completely ambulatory  Blood pressure (!) 127/48, pulse 68, temperature 98.3 F (36.8 C), temperature source Tympanic, resp. rate 18, height 5\' 2"  (1.575 m), weight 118 lb (53.5 kg), SpO2 100 %.  LABORATORY DATA: Lab Results  Component Value Date   WBC 3.3 (L) 12/16/2021   HGB 8.8 (L) 12/16/2021   HCT 27.3 (L) 12/16/2021   MCV 108.8 (H) 12/16/2021   PLT 591 (H) 12/16/2021      Chemistry      Component Value Date/Time   NA 140 12/16/2021 1058   NA 141 01/29/2021 1107   K 4.2 12/16/2021 1058   CL 107 12/16/2021 1058   CO2 30 12/16/2021 1058   BUN 20 12/16/2021 1058   BUN 17 01/29/2021 1107   CREATININE 0.89 12/16/2021 1058      Component Value Date/Time   CALCIUM 9.1 12/16/2021 1058   ALKPHOS 39 12/16/2021 1058   AST 19 12/16/2021 1058   ALT 12 12/16/2021 1058   BILITOT 0.5 12/16/2021 1058       RADIOGRAPHIC STUDIES: No results found.  ASSESSMENT AND PLAN: This is a very pleasant 76 years old white female recently diagnosed with essential thrombocythemia with positive JAK2 mutation.  The patient also has anemia and leukocytosis secondary to the myeloproliferative disorder. The patient is currently on treatment with hydroxyurea 500 mg p.o. daily and has been tolerating it fairly well. Repeat CBC today showed improvement of her total white blood  count as well as anemia but there was also further increase in the platelet count. I recommended for the patient to change her dose of hydroxyurea to 1000 mg p.o. on Monday, Wednesday and Friday and 500 on the other days. I will see her back for follow-up visit in 1 months for evaluation and repeat blood work. She was advised to call immediately if she has any other concerning symptoms in the interval. The patient voices understanding of current disease status and treatment options and is in agreement with the current care plan.  All questions were answered. The patient knows to call the clinic with any problems, questions or concerns. We can certainly see the patient much sooner if necessary.  Disclaimer: This note was dictated with voice recognition software. Similar sounding words can inadvertently be transcribed and may not be corrected upon review.

## 2022-01-09 DIAGNOSIS — Z86718 Personal history of other venous thrombosis and embolism: Secondary | ICD-10-CM | POA: Diagnosis not present

## 2022-01-09 DIAGNOSIS — Z7901 Long term (current) use of anticoagulants: Secondary | ICD-10-CM | POA: Diagnosis not present

## 2022-01-09 DIAGNOSIS — K219 Gastro-esophageal reflux disease without esophagitis: Secondary | ICD-10-CM | POA: Diagnosis not present

## 2022-01-09 DIAGNOSIS — D473 Essential (hemorrhagic) thrombocythemia: Secondary | ICD-10-CM | POA: Diagnosis not present

## 2022-01-13 ENCOUNTER — Other Ambulatory Visit: Payer: Self-pay

## 2022-01-13 ENCOUNTER — Inpatient Hospital Stay: Payer: Medicare Other | Attending: Internal Medicine

## 2022-01-13 ENCOUNTER — Inpatient Hospital Stay (HOSPITAL_BASED_OUTPATIENT_CLINIC_OR_DEPARTMENT_OTHER): Payer: Medicare Other | Admitting: Internal Medicine

## 2022-01-13 VITALS — BP 132/53 | HR 73 | Temp 98.6°F | Resp 16 | Wt 114.8 lb

## 2022-01-13 DIAGNOSIS — D649 Anemia, unspecified: Secondary | ICD-10-CM | POA: Diagnosis not present

## 2022-01-13 DIAGNOSIS — D473 Essential (hemorrhagic) thrombocythemia: Secondary | ICD-10-CM | POA: Insufficient documentation

## 2022-01-13 DIAGNOSIS — D471 Chronic myeloproliferative disease: Secondary | ICD-10-CM | POA: Diagnosis not present

## 2022-01-13 DIAGNOSIS — I10 Essential (primary) hypertension: Secondary | ICD-10-CM | POA: Insufficient documentation

## 2022-01-13 DIAGNOSIS — E119 Type 2 diabetes mellitus without complications: Secondary | ICD-10-CM | POA: Diagnosis not present

## 2022-01-13 DIAGNOSIS — Z5111 Encounter for antineoplastic chemotherapy: Secondary | ICD-10-CM

## 2022-01-13 DIAGNOSIS — D72829 Elevated white blood cell count, unspecified: Secondary | ICD-10-CM | POA: Insufficient documentation

## 2022-01-13 LAB — CMP (CANCER CENTER ONLY)
ALT: 28 U/L (ref 0–44)
AST: 21 U/L (ref 15–41)
Albumin: 4 g/dL (ref 3.5–5.0)
Alkaline Phosphatase: 117 U/L (ref 38–126)
Anion gap: 6 (ref 5–15)
BUN: 16 mg/dL (ref 8–23)
CO2: 28 mmol/L (ref 22–32)
Calcium: 8.9 mg/dL (ref 8.9–10.3)
Chloride: 106 mmol/L (ref 98–111)
Creatinine: 0.75 mg/dL (ref 0.44–1.00)
GFR, Estimated: >60 mL/min (ref >60)
Glucose, Bld: 69 mg/dL — ABNORMAL LOW (ref 70–99)
Potassium: 4.1 mmol/L (ref 3.5–5.1)
Sodium: 140 mmol/L (ref 135–145)
Total Bilirubin: 0.8 mg/dL (ref 0.3–1.2)
Total Protein: 5.9 g/dL — ABNORMAL LOW (ref 6.5–8.1)

## 2022-01-13 LAB — CBC WITH DIFFERENTIAL (CANCER CENTER ONLY)
Abs Immature Granulocytes: 0 10*3/uL (ref 0.00–0.07)
Basophils Absolute: 0 10*3/uL (ref 0.0–0.1)
Basophils Relative: 0 %
Eosinophils Absolute: 0 10*3/uL (ref 0.0–0.5)
Eosinophils Relative: 1 %
HCT: 26.8 % — ABNORMAL LOW (ref 36.0–46.0)
Hemoglobin: 8.5 g/dL — ABNORMAL LOW (ref 12.0–15.0)
Immature Granulocytes: 0 %
Lymphocytes Relative: 28 %
Lymphs Abs: 0.5 10*3/uL — ABNORMAL LOW (ref 0.7–4.0)
MCH: 36.5 pg — ABNORMAL HIGH (ref 26.0–34.0)
MCHC: 31.7 g/dL (ref 30.0–36.0)
MCV: 115 fL — ABNORMAL HIGH (ref 80.0–100.0)
Monocytes Absolute: 0.1 10*3/uL (ref 0.1–1.0)
Monocytes Relative: 6 %
Neutro Abs: 1.2 10*3/uL — ABNORMAL LOW (ref 1.7–7.7)
Neutrophils Relative %: 65 %
Platelet Count: 201 10*3/uL (ref 150–400)
RBC: 2.33 MIL/uL — ABNORMAL LOW (ref 3.87–5.11)
RDW: 19.9 % — ABNORMAL HIGH (ref 11.5–15.5)
WBC Count: 1.8 10*3/uL — ABNORMAL LOW (ref 4.0–10.5)
nRBC: 0 % (ref 0.0–0.2)

## 2022-01-13 LAB — LACTATE DEHYDROGENASE: LDH: 178 U/L (ref 98–192)

## 2022-01-13 NOTE — Progress Notes (Signed)
?    [place] ?Telephone:([phone]) [phone]   [phone] ? ?OFFICE PROGRESS NOTE ? ?[name], MD ?[address] ?[place] [address] ? ?DIAGNOSIS: Essential thrombocythemia with positive JAK2 mutation V617F diagnosed in October 2020. ? ?PRIOR THERAPY: None ? ?CURRENT THERAPY: Hydroxyurea 500 mg p.o. daily alternating with 1000 mg p.o. on Monday, Wednesday and Friday. ? ?INTERVAL HISTORY: ?[name] [name] 76 y.o. female returns to the clinic today for follow-up visit accompanied by her daughter [name].  The patient is feeling fine today with no concerning complaints except for scratchy throat and she was seen by her primary care provider and was given prescription for acid reflux.  She is feeling a little bit better.  She denied having any chest pain, shortness of breath, cough or hemoptysis.  She denied having any fever or chills.  She has no nausea, vomiting, diarrhea or constipation.  She has no bleeding, bruises or ecchymosis.  She is here today for evaluation and repeat blood work. ?   ?MEDICAL HISTORY: ?Past Medical History:  ?Diagnosis Date  ? Anemia   ? Coronary artery disease   ? s/p stent to LAD and LCx  ? DM II (diabetes mellitus, type II), controlled ([place])   ? DVT (deep venous thrombosis) ([place])   ? Elevated LFTs   ? Hyperlipidemia   ? Hypertension   ? Memory loss   ? Myocardial infarction [facility])   ? 7 yrs ago  ? ? ?ALLERGIES:  is allergic to ampicillin, avelox [moxifloxacin hcl in nacl], bactrim [sulfamethoxazole-trimethoprim], ibuprofen, penicillins, sulfa antibiotics, codeine, latex, and whey. ? ?MEDICATIONS:  ?Current Outpatient Medications  ?Medication Sig Dispense Refill  ? fenofibrate micronized (LOFIBRA) 200 MG capsule TAKE ONE CAPSULE BY MOUTH ONCE DAILY BEFORE BREAKFAST 90 capsule 2  ? ferrous sulfate 325 (65 FE) MG EC tablet Take 325 mg by mouth as needed.    ? Fish Oil-Cholecalciferol (FISH OIL + D3) 1000-1000 MG-UNIT CAPS Take 1 tablet by mouth 3 (three) times  daily.    ? hydroxyurea (HYDREA) 500 MG capsule TAKE 1 CAPSULE BY MOUTH ONCE DAILY WITH FOOD TO  MINIMIZE  GI  SIDE  EFFECTS. 30 capsule 2  ? levETIRAcetam (KEPPRA) 250 MG tablet Take 1 tablet (250 mg total) by mouth 2 (two) times daily. 180 tablet 4  ? metFORMIN (GLUCOPHAGE) 500 MG tablet Take 500 mg by mouth 2 (two) times daily with a meal.    ? nitroGLYCERIN (NITROSTAT) 0.4 MG SL tablet Place 1 tablet (0.4 mg total) under the tongue every 5 (five) minutes as needed. For chest pain. 25 tablet 0  ? prochlorperazine (COMPAZINE) 10 MG tablet Take 1 tablet (10 mg total) by mouth every 6 (six) hours as needed for nausea or vomiting. 30 tablet 2  ? rosuvastatin (CRESTOR) 20 MG tablet Take 10 mg by mouth daily.    ? warfarin (COUMADIN) 2 MG tablet Take 2 mg by mouth daily.    ? Zinc 25 MG TABS Take 1 tablet by mouth 2 (two) times daily.    ? ?No current facility-administered medications for this visit.  ? ? ?SURGICAL HISTORY:  ?Past Surgical History:  ?Procedure Laterality Date  ? CATARACT EXTRACTION    ? [place]  ? CORONARY ANGIOPLASTY WITH STENT PLACEMENT  04/26/2003  ? NORMAL. EF 65%  ? I & D EXTREMITY  06/22/2012  ? Procedure: IRRIGATION AND DEBRIDEMENT EXTREMITY;  Surgeon: Tennis Must, MD;  Location: [place];  Service: Orthopedics;  Laterality:  Left;  ? I & D EXTREMITY  06/29/2012  ? Procedure: IRRIGATION AND DEBRIDEMENT EXTREMITY;  Surgeon: Tennis Must, MD;  Location: [place];  Service: Orthopedics;  Laterality: Left;  ? OPEN REDUCTION INTERNAL FIXATION (ORIF) DISTAL RADIAL FRACTURE Right 02/04/2018  ? Procedure: OPEN REDUCTION INTERNAL FIXATION (ORIF) RIGHT DISTAL RADIAL FRACTURE;  Surgeon: Leanora Cover, MD;  Location: [place];  Service: Orthopedics;  Laterality: Right;  ? ? ?REVIEW OF SYSTEMS:  A comprehensive review of systems was negative.  ? ?PHYSICAL EXAMINATION: General appearance: alert, cooperative, and no distress ?Head: Normocephalic, without obvious abnormality,  atraumatic ?Neck: no adenopathy, no JVD, supple, symmetrical, trachea midline, and thyroid not enlarged, symmetric, no tenderness/mass/nodules ?Lymph nodes: Cervical, supraclavicular, and axillary nodes normal. ?Resp: clear to auscultation bilaterally ?Back: symmetric, no curvature. ROM normal. No CVA tenderness. ?Cardio: regular rate and rhythm, S1, S2 normal, no murmur, click, rub or gallop ?GI: soft, non-tender; bowel sounds normal; no masses,  no organomegaly ?Extremities: extremities normal, atraumatic, no cyanosis or edema ? ?ECOG PERFORMANCE STATUS: 1 - Symptomatic but completely ambulatory ? ?Blood pressure (!) 132/53, pulse 73, temperature 98.6 ?F (37 ?C), temperature source Tympanic, resp. rate 16, weight 114 lb 12.8 oz (52.1 kg), SpO2 100 %. ? ?LABORATORY DATA: ?Lab Results  ?Component Value Date  ? WBC 3.3 (L) 12/16/2021  ? HGB 8.8 (L) 12/16/2021  ? HCT 27.3 (L) 12/16/2021  ? MCV 108.8 (H) 12/16/2021  ? PLT 591 (H) 12/16/2021  ? ? ?  Chemistry   ?   ?Component Value Date/Time  ? NA 140 12/16/2021 1058  ? NA 141 01/29/2021 1107  ? K 4.2 12/16/2021 1058  ? CL 107 12/16/2021 1058  ? CO2 30 12/16/2021 1058  ? BUN 20 12/16/2021 1058  ? BUN 17 01/29/2021 1107  ? CREATININE 0.89 12/16/2021 1058  ?    ?Component Value Date/Time  ? CALCIUM 9.1 12/16/2021 1058  ? ALKPHOS 39 12/16/2021 1058  ? AST 19 12/16/2021 1058  ? ALT 12 12/16/2021 1058  ? BILITOT 0.5 12/16/2021 1058  ?  ? ? ? ?RADIOGRAPHIC STUDIES: ?No results found. ? ?ASSESSMENT AND PLAN: This is a very pleasant 76 years old white female recently diagnosed with essential thrombocythemia with positive JAK2 mutation.  The patient also has anemia and leukocytosis secondary to the myeloproliferative disorder. ?She is currently on Hydroxyurea to 1000 mg p.o. on Monday, Wednesday and Friday and 500 on the other days. ?Her CBC today showed stable hemoglobin and hematocrit as well as improvement of the platelets count but there was significant decrease in the total  white blood count. ?I recommended for the patient to reduce the dose of her hydroxyurea to 500 mg p.o. daily until her next visit in 1 months. ?I will see her back for follow-up visit in 1 months for evaluation with repeat CBC and LDH. ?She was advised to call immediately if she has any other concerning symptoms in the interval. ?The patient voices understanding of current disease status and treatment options and is in agreement with the current care plan. ? ?All questions were answered. The patient knows to call the clinic with any problems, questions or concerns. We can certainly see the patient much sooner if necessary. ? ?Disclaimer: This note was dictated with voice recognition software. Similar sounding words can inadvertently be transcribed and may not be corrected upon review. ? ? ?  ?  ?

## 2022-01-14 ENCOUNTER — Ambulatory Visit (INDEPENDENT_AMBULATORY_CARE_PROVIDER_SITE_OTHER): Payer: Medicare Other | Admitting: Neurology

## 2022-01-14 ENCOUNTER — Encounter: Payer: Self-pay | Admitting: Neurology

## 2022-01-14 VITALS — BP 122/68 | HR 67 | Ht 62.0 in | Wt 114.0 lb

## 2022-01-14 DIAGNOSIS — E538 Deficiency of other specified B group vitamins: Secondary | ICD-10-CM

## 2022-01-14 DIAGNOSIS — G40909 Epilepsy, unspecified, not intractable, without status epilepticus: Secondary | ICD-10-CM

## 2022-01-14 MED ORDER — VITAMIN B-12 1000 MCG PO TABS: 1000.0000 ug | ORAL_TABLET | Freq: Every day | ORAL | 6 refills | Status: AC
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
## 2022-01-14 NOTE — Patient Instructions (Addendum)
Continue current medications  ?Start vitamin B12 supplement, 1000 mcg daily. ?Follow up with your primary care physician  ?Return in a year  ?

## 2022-01-17 ENCOUNTER — Telehealth: Payer: Self-pay | Admitting: Internal Medicine

## 2022-01-17 NOTE — Telephone Encounter (Signed)
Scheduled per 03/13 los, patient has been called and voicemail was left. ?

## 2022-01-28 ENCOUNTER — Other Ambulatory Visit: Payer: Self-pay | Admitting: Internal Medicine

## 2022-01-28 DIAGNOSIS — D473 Essential (hemorrhagic) thrombocythemia: Secondary | ICD-10-CM

## 2022-02-10 DIAGNOSIS — Z86718 Personal history of other venous thrombosis and embolism: Secondary | ICD-10-CM | POA: Diagnosis not present

## 2022-02-10 DIAGNOSIS — W010XXA Fall on same level from slipping, tripping and stumbling without subsequent striking against object, initial encounter: Secondary | ICD-10-CM | POA: Diagnosis not present

## 2022-02-10 DIAGNOSIS — Z7901 Long term (current) use of anticoagulants: Secondary | ICD-10-CM | POA: Diagnosis not present

## 2022-02-10 DIAGNOSIS — L89153 Pressure ulcer of sacral region, stage 3: Secondary | ICD-10-CM | POA: Diagnosis not present

## 2022-02-11 ENCOUNTER — Inpatient Hospital Stay: Payer: Medicare Other

## 2022-02-11 ENCOUNTER — Other Ambulatory Visit: Payer: Self-pay | Admitting: *Deleted

## 2022-02-11 ENCOUNTER — Inpatient Hospital Stay: Payer: Medicare Other | Attending: Internal Medicine

## 2022-02-11 ENCOUNTER — Other Ambulatory Visit: Payer: Self-pay

## 2022-02-11 ENCOUNTER — Inpatient Hospital Stay (HOSPITAL_BASED_OUTPATIENT_CLINIC_OR_DEPARTMENT_OTHER): Payer: Medicare Other | Admitting: Internal Medicine

## 2022-02-11 ENCOUNTER — Other Ambulatory Visit: Payer: Self-pay | Admitting: Medical Oncology

## 2022-02-11 VITALS — BP 124/55 | HR 83 | Temp 98.3°F | Resp 17 | Ht 62.0 in | Wt 113.1 lb

## 2022-02-11 DIAGNOSIS — D471 Chronic myeloproliferative disease: Secondary | ICD-10-CM | POA: Diagnosis not present

## 2022-02-11 DIAGNOSIS — D508 Other iron deficiency anemias: Secondary | ICD-10-CM | POA: Diagnosis not present

## 2022-02-11 DIAGNOSIS — D649 Anemia, unspecified: Secondary | ICD-10-CM | POA: Insufficient documentation

## 2022-02-11 DIAGNOSIS — E119 Type 2 diabetes mellitus without complications: Secondary | ICD-10-CM | POA: Diagnosis not present

## 2022-02-11 DIAGNOSIS — D72829 Elevated white blood cell count, unspecified: Secondary | ICD-10-CM | POA: Insufficient documentation

## 2022-02-11 DIAGNOSIS — D473 Essential (hemorrhagic) thrombocythemia: Secondary | ICD-10-CM

## 2022-02-11 DIAGNOSIS — I10 Essential (primary) hypertension: Secondary | ICD-10-CM | POA: Insufficient documentation

## 2022-02-11 LAB — CBC WITH DIFFERENTIAL (CANCER CENTER ONLY)
Abs Immature Granulocytes: 0.03 10*3/uL (ref 0.00–0.07)
Basophils Absolute: 0 10*3/uL (ref 0.0–0.1)
Basophils Relative: 0 %
Eosinophils Absolute: 0 10*3/uL (ref 0.0–0.5)
Eosinophils Relative: 0 %
HCT: 24.8 % — ABNORMAL LOW (ref 36.0–46.0)
Hemoglobin: 7.5 g/dL — ABNORMAL LOW (ref 12.0–15.0)
Immature Granulocytes: 1 %
Lymphocytes Relative: 26 %
Lymphs Abs: 0.6 10*3/uL — ABNORMAL LOW (ref 0.7–4.0)
MCH: 35 pg — ABNORMAL HIGH (ref 26.0–34.0)
MCHC: 30.2 g/dL (ref 30.0–36.0)
MCV: 115.9 fL — ABNORMAL HIGH (ref 80.0–100.0)
Monocytes Absolute: 0.2 10*3/uL (ref 0.1–1.0)
Monocytes Relative: 7 %
Neutro Abs: 1.6 10*3/uL — ABNORMAL LOW (ref 1.7–7.7)
Neutrophils Relative %: 66 %
Platelet Count: 516 10*3/uL — ABNORMAL HIGH (ref 150–400)
RBC: 2.14 MIL/uL — ABNORMAL LOW (ref 3.87–5.11)
RDW: 18.4 % — ABNORMAL HIGH (ref 11.5–15.5)
WBC Count: 2.4 10*3/uL — ABNORMAL LOW (ref 4.0–10.5)
nRBC: 0 % (ref 0.0–0.2)

## 2022-02-11 LAB — SAMPLE TO BLOOD BANK

## 2022-02-11 LAB — PREPARE RBC (CROSSMATCH)

## 2022-02-11 LAB — LACTATE DEHYDROGENASE: LDH: 268 U/L — ABNORMAL HIGH (ref 98–192)

## 2022-02-11 NOTE — Progress Notes (Signed)
?    [place] ?Telephone:(336) [phone]   [phone] ? ?OFFICE PROGRESS NOTE ? ?[name], MD ?[address] ?[place] [address] ? ?DIAGNOSIS: Essential thrombocythemia with positive JAK2 mutation V617F diagnosed in October 2020. ? ?PRIOR THERAPY: None ? ?CURRENT THERAPY: Hydroxyurea 500 mg p.o. daily.  ? ?INTERVAL HISTORY: ?[name] [name] 76 y.o. female returns to the clinic today for follow-up visit accompanied by her 2 daughters D[place] and [name].  The patient is feeling fine today except for the baseline fatigue that has been getting worse recently.  She is also looking pale.  She has a fall few weeks ago but landed on the grass.  She denied having any current chest pain, shortness of breath, cough or hemoptysis.  She denied having any fever or chills.  She has no nausea, vomiting, diarrhea or constipation.  She has a grade 2 coccygeal ulcer and she was evaluated by the wound clinic and her 2 daughters who are nurses are taking good care of it.  She has been on hydroxyurea 500 mg p.o. daily the last few weeks.  She is here today for evaluation and repeat blood work. ?   ?MEDICAL HISTORY: ?Past Medical History:  ?Diagnosis Date  ? Anemia   ? Coronary artery disease   ? s/p stent to LAD and LCx  ? DM II (diabetes mellitus, type II), controlled ([place])   ? DVT (deep venous thrombosis) ([place])   ? Elevated LFTs   ? Hyperlipidemia   ? Hypertension   ? Memory loss   ? Myocardial infarction [facility])   ? 7 yrs ago  ? ? ?ALLERGIES:  is allergic to ampicillin, avelox [moxifloxacin hcl in nacl], bactrim [sulfamethoxazole-trimethoprim], ibuprofen, penicillins, sulfa antibiotics, codeine, latex, and whey. ? ?MEDICATIONS:  ?Current Outpatient Medications  ?Medication Sig Dispense Refill  ? fenofibrate micronized (LOFIBRA) 200 MG capsule TAKE ONE CAPSULE BY MOUTH ONCE DAILY BEFORE BREAKFAST 90 capsule 2  ? ferrous sulfate 325 (65 FE) MG EC tablet Take 325 mg by mouth as needed.    ? Fish  Oil-Cholecalciferol (FISH OIL + D3) 1000-1000 MG-UNIT CAPS Take 1 tablet by mouth 3 (three) times daily.    ? hydroxyurea (HYDREA) 500 MG capsule TAKE 1 CAPSULE BY MOUTH ONCE DAILY. MAY TAKE WITH FOOD TO  MINIMIZE  GI  SIDE  EFFECTS. 30 capsule 0  ? levETIRAcetam (KEPPRA) 250 MG tablet Take 1 tablet (250 mg total) by mouth 2 (two) times daily. 180 tablet 4  ? metFORMIN (GLUCOPHAGE) 500 MG tablet Take 500 mg by mouth 2 (two) times daily with a meal.    ? nitroGLYCERIN (NITROSTAT) 0.4 MG SL tablet Place 1 tablet (0.4 mg total) under the tongue every 5 (five) minutes as needed. For chest pain. 25 tablet 0  ? omeprazole (PRILOSEC) 40 MG capsule Take by mouth.    ? prochlorperazine (COMPAZINE) 10 MG tablet Take 1 tablet (10 mg total) by mouth every 6 (six) hours as needed for nausea or vomiting. 30 tablet 2  ? rosuvastatin (CRESTOR) 20 MG tablet Take 10 mg by mouth daily.    ? vitamin B-12 (CYANOCOBALAMIN) 1000 MCG tablet Take 1 tablet (1,000 mcg total) by mouth daily. 30 tablet 6  ? warfarin (COUMADIN) 2 MG tablet Take 2 mg by mouth daily.    ? Zinc 25 MG TABS Take 1 tablet by mouth 2 (two) times daily.    ? ?No current facility-administered medications for this visit.  ? ? ?SURGICAL HISTORY:  ?Past  Surgical History:  ?Procedure Laterality Date  ? CATARACT EXTRACTION    ? [place]  ? CORONARY ANGIOPLASTY WITH STENT PLACEMENT  04/26/2003  ? NORMAL. EF 65%  ? I & D EXTREMITY  06/22/2012  ? Procedure: IRRIGATION AND DEBRIDEMENT EXTREMITY;  Surgeon: Tennis Must, MD;  Location: [place];  Service: Orthopedics;  Laterality: Left;  ? I & D EXTREMITY  06/29/2012  ? Procedure: IRRIGATION AND DEBRIDEMENT EXTREMITY;  Surgeon: Tennis Must, MD;  Location: [place];  Service: Orthopedics;  Laterality: Left;  ? OPEN REDUCTION INTERNAL FIXATION (ORIF) DISTAL RADIAL FRACTURE Right 02/04/2018  ? Procedure: OPEN REDUCTION INTERNAL FIXATION (ORIF) RIGHT DISTAL RADIAL FRACTURE;  Surgeon: Leanora Cover, MD;  Location: [place];  Service: Orthopedics;  Laterality: Right;  ? ? ?REVIEW OF SYSTEMS:  A comprehensive review of systems was negative except for: Constitutional: positive for fatigue ?Musculoskeletal: positive for muscle weakness  ? ?PHYSICAL EXAMINATION: General appearance: alert, cooperative, fatigued, and no distress ?Head: Normocephalic, without obvious abnormality, atraumatic ?Neck: no adenopathy, no JVD, supple, symmetrical, trachea midline, and thyroid not enlarged, symmetric, no tenderness/mass/nodules ?Lymph nodes: Cervical, supraclavicular, and axillary nodes normal. ?Resp: clear to auscultation bilaterally ?Back: symmetric, no curvature. ROM normal. No CVA tenderness. ?Cardio: regular rate and rhythm, S1, S2 normal, no murmur, click, rub or gallop ?GI: soft, non-tender; bowel sounds normal; no masses,  no organomegaly ?Extremities: extremities normal, atraumatic, no cyanosis or edema ? ?ECOG PERFORMANCE STATUS: 1 - Symptomatic but completely ambulatory ? ?Blood pressure (!) 124/55, pulse 83, temperature 98.3 ?F (36.8 ?C), temperature source Tympanic, resp. rate 17, height '5\' 2"'$  (1.575 m), weight 113 lb 1.6 oz (51.3 kg), SpO2 100 %. ? ?LABORATORY DATA: ?Lab Results  ?Component Value Date  ? WBC 2.4 (L) 02/11/2022  ? HGB 7.5 (L) 02/11/2022  ? HCT 24.8 (L) 02/11/2022  ? MCV 115.9 (H) 02/11/2022  ? PLT 516 (H) 02/11/2022  ? ? ?  Chemistry   ?   ?Component Value Date/Time  ? NA 140 01/13/2022 1333  ? NA 141 01/29/2021 1107  ? K 4.1 01/13/2022 1333  ? CL 106 01/13/2022 1333  ? CO2 28 01/13/2022 1333  ? BUN 16 01/13/2022 1333  ? BUN 17 01/29/2021 1107  ? CREATININE 0.75 01/13/2022 1333  ?    ?Component Value Date/Time  ? CALCIUM 8.9 01/13/2022 1333  ? ALKPHOS 117 01/13/2022 1333  ? AST 21 01/13/2022 1333  ? ALT 28 01/13/2022 1333  ? BILITOT 0.8 01/13/2022 1333  ?  ? ? ? ?RADIOGRAPHIC STUDIES: ?No results found. ? ?ASSESSMENT AND PLAN: This is a very pleasant 76 years old white female recently diagnosed with  essential thrombocythemia with positive JAK2 mutation.  The patient also has anemia and leukocytosis secondary to the myeloproliferative disorder. ?Over the last 4 weeks she was on treatment with hydroxyurea 500 mg p.o. daily. ?The patient has been tolerating her treatment with hydroxyurea fairly well. ?Repeat CBC today showed improvement of her total white blood count up to 2.4 but there is decrease in her hemoglobin to 7.5.  Platelets count 516,000. ?I recommended for the patient to continue her current treatment with hydroxyurea with the same dose 500 mg p.o. daily. ?For the anemia, we will arrange for the patient to receive 1 unit of PRBCs transfusion in the next few days. ?I will see her back for follow-up visit in 4 weeks for evaluation and repeat blood work including CBC, iron study and ferritin. ?The patient was  advised to call immediately if she has any other concerning symptoms in the interval. ?The patient voices understanding of current disease status and treatment options and is in agreement with the current care plan. ? ?All questions were answered. The patient knows to call the clinic with any problems, questions or concerns. We can certainly see the patient much sooner if necessary. ? ?Disclaimer: This note was dictated with voice recognition software. Similar sounding words can inadvertently be transcribed and may not be corrected upon review. ? ? ?  ?  ?

## 2022-02-11 NOTE — Progress Notes (Signed)
Order's released

## 2022-02-12 ENCOUNTER — Inpatient Hospital Stay: Payer: Medicare Other | Admitting: Dietician

## 2022-02-12 ENCOUNTER — Inpatient Hospital Stay: Payer: Medicare Other

## 2022-02-12 DIAGNOSIS — D471 Chronic myeloproliferative disease: Secondary | ICD-10-CM | POA: Diagnosis not present

## 2022-02-12 DIAGNOSIS — D649 Anemia, unspecified: Secondary | ICD-10-CM | POA: Diagnosis not present

## 2022-02-12 DIAGNOSIS — D473 Essential (hemorrhagic) thrombocythemia: Secondary | ICD-10-CM

## 2022-02-12 DIAGNOSIS — D72829 Elevated white blood cell count, unspecified: Secondary | ICD-10-CM | POA: Diagnosis not present

## 2022-02-12 DIAGNOSIS — E119 Type 2 diabetes mellitus without complications: Secondary | ICD-10-CM | POA: Diagnosis not present

## 2022-02-12 DIAGNOSIS — I10 Essential (primary) hypertension: Secondary | ICD-10-CM | POA: Diagnosis not present

## 2022-02-12 MED ORDER — SODIUM CHLORIDE 0.9% IV SOLUTION
250.0000 mL | Freq: Once | INTRAVENOUS | Status: AC
  Administered 2022-02-12: 250 mL via INTRAVENOUS

## 2022-02-12 MED ORDER — ACETAMINOPHEN 325 MG PO TABS
650.0000 mg | ORAL_TABLET | Freq: Once | ORAL | Status: AC
  Administered 2022-02-12: 650 mg via ORAL
  Filled 2022-02-12: qty 2

## 2022-02-12 MED ORDER — DIPHENHYDRAMINE HCL 25 MG PO CAPS
25.0000 mg | ORAL_CAPSULE | Freq: Once | ORAL | Status: AC
  Administered 2022-02-12: 25 mg via ORAL
  Filled 2022-02-12: qty 1

## 2022-02-12 NOTE — Progress Notes (Signed)
Nutrition Assessment ? ? ?Reason for Assessment: MST (+wt loss) ? ? ?ASSESSMENT: 76 year old female with thrombocytopenia with +JAK2. She is taking hydroxyurea. Patient receiving 1 unit PRBC today.  ? ?Past medical history includes anemia, CAD, DM2, HLD, HTN, memory loss, MI ? ?Met with patient during infusion. Patient reports poor appetite. Nothing taste good to her. Patient typically eats 2 meals/day. Patient recalls toast or bagel with creamed cheese or peanut butter/jelly, kind bars for breakfast. This morning she ate a cranberry orange muffin. Her daughter lives next door and often will bring her a dinner plate. She ate left over Easter dinner last night. Patient does not eat much meat. She would enjoy having ice cream at the house, but reports her freezer is not working. Patient shares in the last year one by one, her appliances have stopped working. Her microwave is her "breadbox" She does not keep many things in the fridge as it does not keep things very cold. The dishwasher also stopped working. Patient lives off her social security check and does not have the money to replace the appliances. Patient endorses ~20 lb wt loss over the last few years. She shares she lost her husband 3 years ago then her son passed the year after. Patient does not have much interest in cooking anymore. She does not "trust" oral supplements as she is allergic to milk protein.  ? ?Nutrition Focused Physical Exam:  ? ?Orbital Region: moderate ?Buccal Region: severe ?Upper Arm Region: severe ?Temple Region: moderate ?Clavicle Bone Region: severe ?Dorsal Hand: severe ?Hair: reviewed ?Eyes: reviewed ?Skin: reviewed ?Nails: reviewed ? ? ?Medications: fish oil, keppra, ferrous sulfate, fenofibrate, metformin, prilosec, compazine, crestor, coumadin, zinc, B12 ? ? ?Labs: Hgb 7.5 ? ? ?Anthropometrics:  ? ?Height: 5'2" ?Weight: 113 lb 1.6 oz  ?UBW: 125 lb (August 2022) ?BMI: 20.69 ? ? ?MALNUTRITION DIAGNOSIS: Patient meets criteria for  severe malnutrition in the context of social/environmental circumstances as evidenced by moderate fat/muscle depletion, intake meets <75% >/= 3 months per dietary recall, ~10% decrease from usual weight in the last 8 months which is insignificant for time frame, however concerning.  ? ? ?INTERVENTION:  ?Educated on strategies for diminished taste - handout with tips provided ?Discussed high calorie, high protein snack ideas and encouraged small frequent meals - handout provided ?Discussed foods that provide iron - handout provided ?Offered samples of [employer] [place] supplements which are dairy free - pt politely declined ?Suggested Meals on Wheels as option to receive daily meal - she is not interested as she would prefer to make her own food ?Will place referral to LCSW ? ? ?MONITORING, EVALUATION, GOAL: Patient will tolerate increased calories and protein to promote weight gain  ? ? ?Next Visit: To be scheduled ? ? ? ? ? ?

## 2022-02-12 NOTE — Patient Instructions (Signed)
Blood Transfusion, Adult, Care After This sheet gives you information about how to care for yourself after your procedure. Your doctor may also give you more specific instructions. If you have problems or questions, contact your doctor. What can I expect after the procedure? After the procedure, it is common to have: Bruising and soreness at the IV site. A headache. Follow these instructions at home: Insertion site care   Follow instructions from your doctor about how to take care of your insertion site. This is where an IV tube was put into your vein. Make sure you: Wash your hands with soap and water before and after you change your bandage (dressing). If you cannot use soap and water, use hand sanitizer. Change your bandage as told by your doctor. Check your insertion site every day for signs of infection. Check for: Redness, swelling, or pain. Bleeding from the site. Warmth. Pus or a bad smell. General instructions Take over-the-counter and prescription medicines only as told by your doctor. Rest as told by your doctor. Go back to your normal activities as told by your doctor. Keep all follow-up visits as told by your doctor. This is important. Contact a doctor if: You have itching or red, swollen areas of skin (hives). You feel worried or nervous (anxious). You feel weak after doing your normal activities. You have redness, swelling, warmth, or pain around the insertion site. You have blood coming from the insertion site, and the blood does not stop with pressure. You have pus or a bad smell coming from the insertion site. Get help right away if: You have signs of a serious reaction. This may be coming from an allergy or the body's defense system (immune system). Signs include: Trouble breathing or shortness of breath. Swelling of the face or feeling warm (flushed). Fever or chills. Head, chest, or back pain. Dark pee (urine) or blood in the pee. Widespread rash. Fast  heartbeat. Feeling dizzy or light-headed. You may receive your blood transfusion in an outpatient setting. If so, you will be told whom to contact to report any reactions. These symptoms may be an emergency. Do not wait to see if the symptoms will go away. Get medical help right away. Call your local emergency services (911 in the [place] Do not drive yourself to the hospital. Summary Bruising and soreness at the IV site are common. Check your insertion site every day for signs of infection. Rest as told by your doctor. Go back to your normal activities as told by your doctor. Get help right away if you have signs of a serious reaction. This information is not intended to replace advice given to you by your health care provider. Make sure you discuss any questions you have with your health care provider. Document Revised: 02/14/2021 Document Reviewed: 04/14/2019 Elsevier Patient Education  2022 [employer]vier Inc.  

## 2022-02-13 LAB — BPAM RBC
Blood Product Expiration Date: 202304232359
ISSUE DATE / TIME: 202304121404
Unit Type and Rh: 6200

## 2022-02-13 LAB — TYPE AND SCREEN
ABO/RH(D): A POS
Antibody Screen: NEGATIVE
Unit division: 0

## 2022-02-19 ENCOUNTER — Encounter: Payer: Self-pay | Admitting: Licensed Clinical Social Worker

## 2022-02-19 DIAGNOSIS — D473 Essential (hemorrhagic) thrombocythemia: Secondary | ICD-10-CM

## 2022-02-19 NOTE — Progress Notes (Signed)
[facility] CSW Progress Note ? ?Clinical Social Worker  received referral from dietician  to follow up with pt.  Writer spoke w/ pt's daughter [name]) over the phone to assess concerns.  Pt reportedly has physically declined over the past few months with her appetite becoming less as time goes by.  Pt has experienced significant loss over the past couple of years losing both her husband and son which has taken a significant emotional toll.  CSW provided daughter with contact information for the social worker in [place] to explore supportive services which may be available locally.  CSW also suggested pt's PCP could be contacted to see if a home care referral may be appropriate for RN/PT services to address the physical decline.  Emotional support provided.  CSW to remain available to address any additional concerns which may arise. ? ? ? ?Henriette Combs , LCSW ?

## 2022-02-20 ENCOUNTER — Encounter: Payer: Self-pay | Admitting: Cardiovascular Disease

## 2022-02-20 DIAGNOSIS — L89153 Pressure ulcer of sacral region, stage 3: Secondary | ICD-10-CM | POA: Diagnosis not present

## 2022-02-20 DIAGNOSIS — Z7901 Long term (current) use of anticoagulants: Secondary | ICD-10-CM | POA: Diagnosis not present

## 2022-02-20 DIAGNOSIS — Z86718 Personal history of other venous thrombosis and embolism: Secondary | ICD-10-CM | POA: Diagnosis not present

## 2022-02-20 NOTE — Progress Notes (Signed)
? ?Cardiology Office Note ? ? ?Date:  02/21/2022  ? ?ID:  [name] [name], DOB [birth year], MRN [id number] ? ?PCP:  [name], MD  ?Cardiologist:   Mertie Moores, MD  ? ?No chief complaint on file. ? ?1. Coronary artery disease-status post PTCA and stenting of her left complex artery. She status post PTCA and stenting of her right coronary artery in [place] in 2005. ?2. Hyperlipidemia ?3. Hypertension ?4. Bilateral DVT -  2015 , on anticoagulation  ? ?[name] [name] is a 76 y.o. female with the above noted hx.  She has had lots of problems with her husband who has bipolar disease ( will not take his meds).  Otherwise she is doing well.  She is still walking some but needs to get back into it ? ?Nov. 16, 2015: ? ?[name] [name] is a 76 y.o. female who I see for hx of CAD - hx of PCI / stenting of her LCX ?She was found to have a DVT in both legs.   ?She was seen by Dr.  Virgina Jock.    ?No clear etiology was found  ?  ?Mar 19, 2015 : ?[name] [name] is a 76 y.o. female who presents for  CAD  ? ?She has done well.  ?She had DVT in Sept. 2015. ?Still on xarelto.  ? ?Nov. 15, 2016: ?Doing well.  Has some fatigue and chest tightness toward the end of the day. ?No CP with exertion.   ?Husband has been diagnosed with Parkinsons .  Lots of stress.  ? ?Mar 05, 2016: ? ?[name] [name] is seen today  ?BP is a bit high.   ?Is not eating more salt than usual -  ? ?Oct. 30, 2017: ? ?[name] [name] is seen today  ?No cardiac issues. ?No angina  ?Is frustrated with her husband who has Parkinsons and likely has some degree of dementia. He does not shower regularly  ? ?March 02, 2017: ? ?[name] [name] is seen today for follow up visit for CAD, HTN,  and hyperlipidemia .  ?Is doing well .  ?No dyspnea. ?Has some fatigue at the end of the day which goes away after she relaxes.   ?Is not walking much  ?Able to mow her entire lawn without any problems.  ? ?January 03, 2020 ?[name] [name] is seen today for follow up of her CAD, HTN, and hyperlpidemia  ?Seen with her daughter, [name] today  ?She has a history  of essential thrombocythemia with a positive JAK2 mutation [id number]  which was diagnosed October 2020.  She is seeing Dr. Mora Appl. ?Has anemia and elevated platelet counts.   2 units of PRBC on New Years day  ? ?Has rare episodes in CP when she is out in the cold  ?No exercise,  Has not been walking ? ?January 29, 2021: ?[name] [name] is seen today for follow up of her CAD, HTN, HLD ?Has had a dry mouth ,  Has seen ENT.  ?Has episodes of orthostatic hypotension  ? ? ?February 21, 2022 ? ?[name] [name] is seen today for follow up of her CAd, HTN, HLD  ?Has been losing lots of weight over the past 6 months ( since husband passed away )  ? ?Occasional episodes of chest pain .  ?Does not feel like her previous angina .  ? ? ? ? ?Past Medical History:  ?Diagnosis Date  ? Anemia   ? Coronary artery disease   ? s/p stent to LAD and LCx  ? DM II (diabetes mellitus, type II), controlled ([place])   ?  DVT (deep venous thrombosis) ([place])   ? Elevated LFTs   ? Hyperlipidemia   ? Hypertension   ? Memory loss   ? Myocardial infarction [facility])   ? 7 yrs ago  ? ? ?Past Surgical History:  ?Procedure Laterality Date  ? CATARACT EXTRACTION    ? [place]  ? CORONARY ANGIOPLASTY WITH STENT PLACEMENT  04/26/2003  ? NORMAL. EF 65%  ? I & D EXTREMITY  06/22/2012  ? Procedure: IRRIGATION AND DEBRIDEMENT EXTREMITY;  Surgeon: Tennis Must, MD;  Location: [facility];  Service: Orthopedics;  Laterality: Left;  ? I & D EXTREMITY  06/29/2012  ? Procedure: IRRIGATION AND DEBRIDEMENT EXTREMITY;  Surgeon: Tennis Must, MD;  Location: [facility];  Service: Orthopedics;  Laterality: Left;  ? OPEN REDUCTION INTERNAL FIXATION (ORIF) DISTAL RADIAL FRACTURE Right 02/04/2018  ? Procedure: OPEN REDUCTION INTERNAL FIXATION (ORIF) RIGHT DISTAL RADIAL FRACTURE;  Surgeon: Leanora Cover, MD;  Location: [facility];  Service: Orthopedics;  Laterality: Right;  ? ? ? ?Current Outpatient Medications  ?Medication Sig Dispense Refill  ? fenofibrate micronized (LOFIBRA) 200 MG capsule  TAKE ONE CAPSULE BY MOUTH ONCE DAILY BEFORE BREAKFAST 90 capsule 2  ? ferrous sulfate 325 (65 FE) MG EC tablet Take 325 mg by mouth as needed.    ? Fish Oil-Cholecalciferol (FISH OIL + D3) 1000-1000 MG-UNIT CAPS Take 1 tablet by mouth 3 (three) times daily.    ? hydroxyurea (HYDREA) 500 MG capsule TAKE 1 CAPSULE BY MOUTH ONCE DAILY. MAY TAKE WITH FOOD TO  MINIMIZE  GI  SIDE  EFFECTS. 30 capsule 0  ? levETIRAcetam (KEPPRA) 250 MG tablet Take 1 tablet (250 mg total) by mouth 2 (two) times daily. 180 tablet 4  ? metFORMIN (GLUCOPHAGE) 500 MG tablet Take 1 tablet by mouth daily.    ? nitroGLYCERIN (NITROSTAT) 0.4 MG SL tablet Place 1 tablet (0.4 mg total) under the tongue every 5 (five) minutes as needed. For chest pain. 25 tablet 0  ? omeprazole (PRILOSEC) 40 MG capsule Take by mouth.    ? prochlorperazine (COMPAZINE) 10 MG tablet Take 1 tablet (10 mg total) by mouth every 6 (six) hours as needed for nausea or vomiting. 30 tablet 2  ? rosuvastatin (CRESTOR) 20 MG tablet Take 10 mg by mouth daily.    ? warfarin (COUMADIN) 2 MG tablet Take 2 mg by mouth daily.    ? Zinc 25 MG TABS Take 1 tablet by mouth 2 (two) times daily.    ? ?No current facility-administered medications for this visit.  ? ? ?Allergies:   Ampicillin, Avelox [moxifloxacin hcl in nacl], Bactrim [sulfamethoxazole-trimethoprim], Ibuprofen, Penicillins, Diphenhydramine, Sulfa antibiotics, Codeine, Latex, and Whey  ? ? ?Social History:  The patient  reports that she has never smoked. She has never used smokeless tobacco. She reports current alcohol use. She reports that she does not use drugs.  ? ?Family History:  The patient's family history includes Aneurysm in her mother; Cirrhosis in her father; Dementia in her sister; Heart attack in her father and mother; Heart disease in her father and mother; Osteoarthritis in her sister; Stroke in her mother.  ? ? ?ROS:  Please see the history of present illness.  ? ?  All other systems are reviewed and negative.   ? ?Physical Exam: ?Blood pressure (!) 112/54, pulse 76, height '5\' 2"'$  (1.575 m), weight 113 lb 9.6 oz (51.5 kg), SpO2 96 %. ? ?GEN:  Well nourished, well developed in no acute  distress ?HEENT: Normal ?NECK: No JVD; No carotid bruits ?LYMPHATICS: No lymphadenopathy ?CARDIAC: RRR , soft systolic murmur radiating to the L axilla  ?RESPIRATORY:  Clear to auscultation without rales, wheezing or rhonchi  ?ABDOMEN: Soft, non-tender, non-distended ?MUSCULOSKELETAL:  No edema; No deformity  ?SKIN: Warm and dry ?NEUROLOGIC:  Alert and oriented x 3 ? ? ?EKG:  ? ? ?Recent Labs: ?07/09/2021: TSH 1.170 ?01/13/2022: ALT 28; BUN 16; Creatinine 0.75; Potassium 4.1; Sodium 140 ?02/11/2022: Hemoglobin 7.5; Platelet Count 516  ? ? ?Lipid Panel ?   ?Component Value Date/Time  ? CHOL 97 (L) 01/29/2021 1107  ? TRIG 105 01/29/2021 1107  ? HDL 30 (L) 01/29/2021 1107  ? CHOLHDL 3.2 01/29/2021 1107  ? LDLCALC 47 01/29/2021 1107  ? ?  ? ?Wt Readings from Last 3 Encounters:  ?02/21/22 113 lb 9.6 oz (51.5 kg)  ?02/11/22 113 lb 1.6 oz (51.3 kg)  ?01/14/22 114 lb (51.7 kg)  ?  ? ? ?Other studies Reviewed: ?Additional studies/ records that were reviewed today include: . ?Review of the above records demonstrates:  ? ? ?ASSESSMENT AND PLAN: ? ?1. Coronary artery disease-   MI in 2005.   No angan  ? ?2. Hyperlipidemia-    lipids from last year were reviewed,  look good.  ?Repeat labs with primary md later this summer  ? ? ?3. Hypertension  - BP is well controlled ? ?4.DVT -  ? ?5.  Carotid bruit versus radiated systolic murmur:   ? ?6.  Systolic murmur.   - stable  ? ?Current medicines are reviewed at length with the patient today.  The patient has concerns regarding medicines. ? ?The following changes have been made:  no change ? ?Labs/ tests ordered today include:  ? ?No orders of the defined types were placed in this encounter. ? ? ? ?Disposition:  ?  ? ?Mertie Moores, MD  ?02/21/2022 9:23 AM    ?[facility] [address],  [place], [name]  [address] ?Phone: [phone]; Fax: [phone]  ?

## 2022-02-21 ENCOUNTER — Ambulatory Visit (INDEPENDENT_AMBULATORY_CARE_PROVIDER_SITE_OTHER): Payer: Medicare Other | Admitting: Cardiovascular Disease

## 2022-02-21 ENCOUNTER — Encounter: Payer: Self-pay | Admitting: Cardiovascular Disease

## 2022-02-21 VITALS — BP 112/54 | HR 76 | Ht 62.0 in | Wt 113.6 lb

## 2022-02-21 DIAGNOSIS — I2510 Atherosclerotic heart disease of native coronary artery without angina pectoris: Secondary | ICD-10-CM | POA: Diagnosis not present

## 2022-02-21 DIAGNOSIS — E782 Mixed hyperlipidemia: Secondary | ICD-10-CM | POA: Diagnosis not present

## 2022-02-21 NOTE — Patient Instructions (Signed)
Medication Instructions:  ?Your physician recommends that you continue on your current medications as directed. Please refer to the Current Medication list given to you today. ? ?*If you need a refill on your cardiac medications before your next appointment, please call your pharmacy* ? ?Lab Work: ?NONE ?If you have labs (blood work) drawn today and your tests are completely normal, you will receive your results only by: ?MyChart Message (if you have MyChart) OR ?A paper copy in the mail ?If you have any lab test that is abnormal or we need to change your treatment, we will call you to review the results. ? ?Testing/Procedures: ?NONE ? ?Follow-Up: ?At [facility], you and your health needs are our priority.  As part of our continuing mission to provide you with exceptional heart care, we have created designated Provider Care Teams.  These Care Teams include your primary Cardiologist (physician) and Advanced Practice Providers (APPs -  Physician Assistants and Nurse Practitioners) who all work together to provide you with the care you need, when you need it. ? ?We recommend signing up for the patient portal called "MyChart".  Sign up information is provided on this After Visit Summary.  MyChart is used to connect with patients for Virtual Visits (Telemedicine).  Patients are able to view lab/test results, encounter notes, upcoming appointments, etc.  Non-urgent messages can be sent to your provider as well.   ?To learn more about what you can do with MyChart, go to [email or web address].   ? ?Your next appointment:   ?1 year(s) ? ?The format for your next appointment:   ?In Person ? ?Provider:   ?Mertie Moores, MD  or Robbie Lis, PA-C, Christen Bame, NP, or Richardson Dopp, PA-C      ? ? ?Other Instructions ? ?Important Information About Sugar ? ? ? ? ?  ?

## 2022-02-26 ENCOUNTER — Telehealth: Payer: Self-pay

## 2022-02-26 ENCOUNTER — Other Ambulatory Visit: Payer: Self-pay | Admitting: Internal Medicine

## 2022-02-26 DIAGNOSIS — D473 Essential (hemorrhagic) thrombocythemia: Secondary | ICD-10-CM

## 2022-02-26 MED ORDER — HYDROXYUREA 500 MG PO CAPS: ORAL_CAPSULE | ORAL | 0 refills | Status: DC

## 2022-02-26 NOTE — Telephone Encounter (Signed)
Pts daughter called stating pt needs a refill of Hydrea sent to the [facility] on [address], ?

## 2022-03-05 ENCOUNTER — Encounter (HOSPITAL_BASED_OUTPATIENT_CLINIC_OR_DEPARTMENT_OTHER): Payer: Medicare Other | Attending: General Surgery | Admitting: General Surgery

## 2022-03-05 DIAGNOSIS — I10 Essential (primary) hypertension: Secondary | ICD-10-CM | POA: Insufficient documentation

## 2022-03-05 DIAGNOSIS — Z6820 Body mass index (BMI) 20.0-20.9, adult: Secondary | ICD-10-CM | POA: Insufficient documentation

## 2022-03-05 DIAGNOSIS — E11622 Type 2 diabetes mellitus with other skin ulcer: Secondary | ICD-10-CM | POA: Diagnosis not present

## 2022-03-05 DIAGNOSIS — E43 Unspecified severe protein-calorie malnutrition: Secondary | ICD-10-CM | POA: Insufficient documentation

## 2022-03-05 DIAGNOSIS — L89313 Pressure ulcer of right buttock, stage 3: Secondary | ICD-10-CM | POA: Insufficient documentation

## 2022-03-05 DIAGNOSIS — D473 Essential (hemorrhagic) thrombocythemia: Secondary | ICD-10-CM | POA: Diagnosis not present

## 2022-03-05 DIAGNOSIS — I2510 Atherosclerotic heart disease of native coronary artery without angina pectoris: Secondary | ICD-10-CM | POA: Diagnosis not present

## 2022-03-05 NOTE — Progress Notes (Signed)
Troeger, Tymesha A. ([id number]) ?Visit Report for 03/05/2022 ?Chief Complaint Document Details ?Patient Name: Date of Service: ?[name] [name] [name], [name] [name] [name] [name] A. 03/05/2022 9:00 A M ?Medical Record Number: [id number] ?Patient Account Number: [id number] ?Date of Birth/Sex: Treating RN: ?[birth year] (76 y.o. F) [name], [name] [name] ?Primary Care Provider: [name] Other Clinician: ?Referring Provider: ?Treating Provider/Extender: Fredirick Maudlin ?[name] ?Weeks in Treatment: 0 ?Information Obtained from: Patient ?Chief Complaint ?Patient is at the clinic for treatment of an open pressure ulcer ?Electronic Signature(s) ?Signed: 03/05/2022 10:46:03 AM By: Fredirick Maudlin MD FACS ?Entered By: Fredirick Maudlin on 03/05/2022 10:46:03 ?-------------------------------------------------------------------------------- ?Debridement Details ?Patient Name: Date of Service: ?[name] [name] [name], [name] [name] [name] [name] A. 03/05/2022 9:00 A M ?Medical Record Number: [id number] ?Patient Account Number: [id number] ?Date of Birth/Sex: Treating RN: ?04-12-1946 (76 y.o. F) [name], [name] [name] ?Primary Care Provider: [name] Other Clinician: ?Referring Provider: ?Treating Provider/Extender: Fredirick Maudlin ?[name] ?Weeks in Treatment: 0 ?Debridement Performed for Assessment: Wound #1 Right Gluteus ?Performed By: Physician Fredirick Maudlin, MD ?Debridement Type: Debridement ?Level of Consciousness (Pre-procedure): Awake and Alert ?Pre-procedure Verification/Time Out Yes - 10:00 ?Taken: ?Start Time: 10:01 ?Pain Control: Lidocaine 4% T opical Solution ?T Area Debrided (L x W): ?otal 1.6 (cm) x 1.6 (cm) = 2.56 (cm?) ?Tissue and other material debrided: Viable, Non-Viable, Slough, Subcutaneous, Slough ?Level: Skin/Subcutaneous Tissue ?Debridement Description: Excisional ?Instrument: Curette, Forceps, Scissors ?Bleeding: Minimum ?Hemostasis Achieved: Pressure ?Procedural Pain: 6 ?Post Procedural Pain: 4 ?Response to Treatment: Procedure was tolerated well ?Level of Consciousness (Post-  Awake and Alert ?procedure): ?Post Debridement Measurements of Total Wound ?Length: (cm) 1.6 ?Stage: Category/Stage III ?Width: (cm) 1.6 ?Depth: (cm) 0.1 ?Volume: (cm?) 0.201 ?Character of Wound/Ulcer Post Debridement: Requires Further Debridement ?Post Procedure Diagnosis ?Same as Pre-procedure ?Electronic Signature(s) ?Signed: 03/05/2022 11:35:57 AM By: Fredirick Maudlin MD FACS ?Signed: 03/05/2022 5:38:18 PM By: Baruch Gouty RN, BSN ?Entered By: Baruch Gouty on 03/05/2022 10:25:31 ?-------------------------------------------------------------------------------- ?HPI Details ?Patient Name: Date of Service: ?[name] [name] [name], [name] [name] [name] [name] A. 03/05/2022 9:00 A M ?Medical Record Number: [id number] ?Patient Account Number: [id number] ?Date of Birth/Sex: Treating RN: ?08/10/1946 (76 y.o. F) [name], [name] [name] ?Primary Care Provider: [name] Other Clinician: ?Referring Provider: ?Treating Provider/Extender: Fredirick Maudlin ?[name] ?Weeks in Treatment: 0 ?History of Present Illness ?HPI Description: ADMISSION ?03/05/2022 ?This is a 76 year old woman with a past medical history notable for type 2 diabetes mellitus, hypertension, coronary artery disease, and essential ?thrombocythemia. Her husband died fairly recently and in that time she has lost a substantial amount of weight. She is accompanied by one of her daughters ?who is a Marine scientist. Her other daughter is also a Marine scientist and lives next door. On February 05, 2022, the patient notified her daughters that she had a sore on her right ?buttock. Apparently 2 weeks prior to that, she fell in the yard and landed on her backside, but landed on grass and did not seem to sustain any obvious ?trauma. Since reporting the wound to her daughters, they have used some Santyl that they had leftover and have been using Medihoney since they ran out of ?that. She does not take insulin and her last hemoglobin A1c was reported to be 5.7. She is on hydroxyurea for essential thrombocythemia. On her right  gluteus, ?there is a circular wound with thick fibrinous slough. There is some visible pink tissue underneath the slough at the periphery of the wound. No odor or ?significant drainage. ?Electronic Signature(s) ?Signed: 03/05/2022 10:54:56 AM By: Fredirick Maudlin MD FACS ?Previous Signature: 03/05/2022 10:48:33 AM Version By: Fredirick Maudlin MD FACS ?Entered  By: Fredirick Maudlin on 03/05/2022 10:54:56 ?-------------------------------------------------------------------------------- ?Physical Exam Details ?Patient Name: Date of Service: ?[name] [name] [name], [name] [name] [name] [name] A. 03/05/2022 9:00 A M ?Medical Record Number: [id number] ?Patient Account Number: [id number] ?Date of Birth/Sex: Treating RN: ?Nov 17, 1945 (76 y.o. F) [name], [name] [name] ?Primary Care Provider: [name] Other Clinician: ?Referring Provider: ?Treating Provider/Extender: Fredirick Maudlin ?[name] ?Weeks in Treatment: 0 ?Constitutional ?. . . . No acute distress. ?Respiratory ?Normal work of breathing on room air.Marland Kitchen ?Notes ?03/05/2022: On the right gluteus, there is a circular wound with thick fibrinous slough. There is some pink tissue faintly visible underneath the slough. There is no ?odor or significant drainage. The periwound skin is intact. ?Electronic Signature(s) ?Signed: 03/05/2022 10:57:25 AM By: Fredirick Maudlin MD FACS ?Entered By: Fredirick Maudlin on 03/05/2022 10:57:25 ?-------------------------------------------------------------------------------- ?Physician Orders Details ?Patient Name: ?Date of Service: ?[name] [name] [name], [name] [name] [name] [name] A. 03/05/2022 9:00 A M ?Medical Record Number: [id number] ?Patient Account Number: [id number] ?Date of Birth/Sex: ?Treating RN: ?[birth year] (76 y.o. F) [name], [name] [name] ?Primary Care Provider: [name] ?Other Clinician: ?Referring Provider: ?Treating Provider/Extender: Fredirick Maudlin ?[name] ?Weeks in Treatment: 0 ?Verbal / Phone Orders: No ?Diagnosis Coding ?ICD-10 Coding ?Code Description ?L89.313 Pressure ulcer of right buttock, stage  3 ?E11.622 Type 2 diabetes mellitus with other skin ulcer ?E43 Unspecified severe protein-calorie malnutrition ?D47.3 Essential (hemorrhagic) thrombocythemia ?Follow-up Appointments ?ppointment in 1 week. - Dr. Celine Ahr Rm 1 ?Return A ?5/9 @ 11:15 am ?Bathing/ Shower/ Hygiene ?May shower and wash wound with soap and water. ?Off-Loading ?Other: - stand at least hourly during the day ?Additional Orders / Instructions ?Follow Nutritious Diet - add protein supplements to diet ?Wound Treatment ?Wound #1 - Gluteus Wound Laterality: Right ?Prim Dressing: Santyl Ointment 1 x Per Day/30 Days ?ary ?Discharge Instructions: Apply nickel thick amount to wound bed as instructed ?Secondary Dressing: Zetuvit Plus Silicone Border Dressing 4x4 (in/in) 1 x Per Day/30 Days ?Discharge Instructions: Apply silicone border over primary dressing as directed. ?Patient Medications ?llergies: ampicillin, Avelox, Sulfa (Sulfonamide Antibiotics), ibuprofen, penicillin, codeine, latex, whey ?A ?Notifications Medication Indication Start End ?prior to debridement 03/05/2022 ?lidocaine ?DOSE topical 5 % ointment - ointment topical ?03/05/2022 ?Santyl ?DOSE topical 250 unit/gram ointment - apply to wound with daily dressing change ?Electronic Signature(s) ?Signed: 03/05/2022 11:35:57 AM By: Fredirick Maudlin MD FACS ?Previous Signature: 03/05/2022 10:59:15 AM Version By: Fredirick Maudlin MD FACS ?Entered By: Fredirick Maudlin on 03/05/2022 10:59:25 ?-------------------------------------------------------------------------------- ?Problem List Details ?Patient Name: ?Date of Service: ?[name] [name] [name], [name] [name] [name] [name] A. 03/05/2022 9:00 A M ?Medical Record Number: [id number] ?Patient Account Number: [id number] ?Date of Birth/Sex: ?Treating RN: ?December 11, 1945 (76 y.o. F) [name], [name] [name] ?Primary Care Provider: [name] ?Other Clinician: ?Referring Provider: ?Treating Provider/Extender: Fredirick Maudlin ?[name] ?Weeks in Treatment: 0 ?Active Problems ?ICD-10 ?Encounter ?Code  Description Active Date MDM ?Diagnosis ?L89.313 Pressure ulcer of right buttock, stage 3 03/05/2022 No Yes ?E11.622 Type 2 diabetes mellitus with other skin ulcer 03/05/2022 No Yes ?E43 Unspecified severe protein-calor

## 2022-03-05 NOTE — Progress Notes (Signed)
Burkle, Kimiko A. ([id number]) ?Visit Report for 03/05/2022 ?Allergy List Details ?Patient Name: Date of Service: ?[name] [name], [name] [name] 03/05/2022 9:00 A M ?Medical Record Number: [id number] ?Patient Account Number: [id number] ?Date of Birth/Sex: Treating RN: ?[birth year] (76 y.o. F) [name], [name] ?Primary Care Adelfo Diebel: [name] Other Clinician: ?Referring Lark Runk: ?Treating Shaquelle Hernon/Extender: Fredirick Maudlin ?[name] ?Weeks in Treatment: 0 ?Allergies ?Active Allergies ?ampicillin ?Reaction: hives, swelling ?Avelox ?Reaction: swelling ?Sulfa (Sulfonamide Antibiotics) ?Reaction: hives, swelling ?ibuprofen ?Reaction: swollen eyes, rash ?penicillin ?Reaction: hives ?codeine ?Reaction: rash ?latex ?Reaction: rash ?whey ?Reaction: rash ?Allergy Notes ?Electronic Signature(s) ?Signed: 03/05/2022 5:38:18 PM By: Baruch Gouty RN, BSN ?Entered By: Baruch Gouty on 03/05/2022 09:15:48 ?-------------------------------------------------------------------------------- ?Arrival Information Details ?Patient Name: Date of Service: ?[name] [name], [name] [name] 03/05/2022 9:00 A M ?Medical Record Number: [id number] ?Patient Account Number: [id number] ?Date of Birth/Sex: Treating RN: ?1945/11/25 (76 y.o. F) [name], [name] ?Primary Care Delonda Coley: [name] Other Clinician: ?Referring Marci Polito: ?Treating Aydia Maj/Extender: Fredirick Maudlin ?[name] ?Weeks in Treatment: 0 ?Visit Information ?Patient Arrived: Ambulatory ?Arrival Time: 09:07 ?Accompanied By: daughter ?Transfer Assistance: None ?Patient Identification Verified: Yes ?Secondary Verification Process Completed: Yes ?Patient Requires Transmission-Based Precautions: No ?Patient Has Alerts: Yes ?Patient Alerts: Patient on Blood Thinner ?Electronic Signature(s) ?Signed: 03/05/2022 5:38:18 PM By: Baruch Gouty RN, BSN ?Entered By: Baruch Gouty on 03/05/2022 09:10:30 ?-------------------------------------------------------------------------------- ?Clinic Level of Care Assessment  Details ?Patient Name: Date of Service: ?[name] [name], [name] [name] 03/05/2022 9:00 A M ?Medical Record Number: [id number] ?Patient Account Number: [id number] ?Date of Birth/Sex: Treating RN: ?February 20, 1946 (76 y.o. F) [name], [name] ?Primary Care Pamela Intrieri: [name] Other Clinician: ?Referring Tamario Heal: ?Treating Terrez Ander/Extender: Fredirick Maudlin ?[name] ?Weeks in Treatment: 0 ?Clinic Level of Care Assessment Items ?TOOL 1 Quantity Score ?'[]'$  - 0 ?Use when EandM and Procedure is performed on INITIAL visit ?ASSESSMENTS - Nursing Assessment / Reassessment ?X- 1 20 ?General Physical Exam (combine w/ comprehensive assessment (listed just below) when performed on new pt. evals) ?X- 1 25 ?Comprehensive Assessment (HX, ROS, Risk Assessments, Wounds Hx, etc.) ?ASSESSMENTS - Wound and Skin Assessment / Reassessment ?'[]'$  - 0 ?Dermatologic / Skin Assessment (not related to wound area) ?ASSESSMENTS - Ostomy and/or Continence Assessment and Care ?'[]'$  - 0 ?Incontinence Assessment and Management ?'[]'$  - 0 ?Ostomy Care Assessment and Management (repouching, etc.) ?PROCESS - Coordination of Care ?X - Simple Patient / Family Education for ongoing care 1 15 ?'[]'$  - 0 ?Complex (extensive) Patient / Family Education for ongoing care ?X- 1 10 ?Staff obtains Consents, Records, T Results / Process Orders ?est ?'[]'$  - 0 ?Staff telephones HHA, Nursing Homes / Clarify orders / etc ?'[]'$  - 0 ?Routine Transfer to another Facility (non-emergent condition) ?'[]'$  - 0 ?Routine Hospital Admission (non-emergent condition) ?X- 1 15 ?New Admissions / Biomedical engineer / Ordering NPWT Apligraf, etc. ?, ?'[]'$  - 0 ?Emergency Hospital Admission (emergent condition) ?PROCESS - Special Needs ?'[]'$  - 0 ?Pediatric / Minor Patient Management ?'[]'$  - 0 ?Isolation Patient Management ?'[]'$  - 0 ?Hearing / Language / Visual special needs ?'[]'$  - 0 ?Assessment of Community assistance (transportation, D/C planning, etc.) ?'[]'$  - 0 ?Additional assistance / Altered mentation ?'[]'$  -  0 ?Support Surface(s) Assessment (bed, cushion, seat, etc.) ?INTERVENTIONS - Miscellaneous ?'[]'$  - 0 ?External ear exam ?'[]'$  - 0 ?Patient Transfer (multiple staff / Civil Service fast streamer / Similar devices) ?'[]'$  - 0 ?Simple Staple / Suture removal (25 or less) ?'[]'$  - 0 ?Complex Staple / Suture removal (26 or more) ?'[]'$  - 0 ?Hypo/Hyperglycemic Management (do not check if billed  separately) ?'[]'$  - 0 ?Ankle / Brachial Index (ABI) - do not check if billed separately ?Has the patient been seen at the hospital within the last three years: Yes ?Total Score: 85 ?Level Of Care: New/Established - Level 3 ?Electronic Signature(s) ?Signed: 03/05/2022 5:38:18 PM By: Baruch Gouty RN, BSN ?Entered By: Baruch Gouty on 03/05/2022 10:07:17 ?-------------------------------------------------------------------------------- ?Encounter Discharge Information Details ?Patient Name: Date of Service: ?[name] [name], [name] [name] 03/05/2022 9:00 A M ?Medical Record Number: [id number] ?Patient Account Number: [id number] ?Date of Birth/Sex: Treating RN: ?July 15, 1946 (76 y.o. F) [name], [name] ?Primary Care Apolo Cutshaw: [name] Other Clinician: ?Referring Juwann Sherk: ?Treating [place] Dodgen/Extender: Fredirick Maudlin ?[name] ?Weeks in Treatment: 0 ?Encounter Discharge Information Items Post Procedure Vitals ?Discharge Condition: Stable ?Temperature (F): 97.9 ?Ambulatory Status: Ambulatory ?Pulse (bpm): 76 ?Discharge Destination: Home ?Respiratory Rate (breaths/min): 18 ?Transportation: Private Auto ?Blood Pressure (mmHg): 135/62 ?Accompanied By: self ?Schedule Follow-up Appointment: Yes ?Clinical Summary of Care: Patient Declined ?Electronic Signature(s) ?Signed: 03/05/2022 5:38:18 PM By: Baruch Gouty RN, BSN ?Entered By: Baruch Gouty on 03/05/2022 10:26:37 ?-------------------------------------------------------------------------------- ?Lower Extremity Assessment Details ?Patient Name: Date of Service: ?[name] [name], [name] [name] 03/05/2022 9:00 A M ?Medical Record Number:  [id number] ?Patient Account Number: [id number] ?Date of Birth/Sex: Treating RN: ?November 21, 1945 (76 y.o. F) [name], [name] ?Primary Care Draxton Luu: [name] Other Clinician: ?Referring Arleen Bar: ?Treating Dhrithi Riche/Extender: Fredirick Maudlin ?[name] ?Weeks in Treatment: 0 ?Electronic Signature(s) ?Signed: 03/05/2022 5:38:18 PM By: Baruch Gouty RN, BSN ?Entered By: Baruch Gouty on 03/05/2022 09:31:29 ?-------------------------------------------------------------------------------- ?Multi Wound Chart Details ?Patient Name: ?Date of Service: ?[name] [name], [name] [name] 03/05/2022 9:00 A M ?Medical Record Number: [id number] ?Patient Account Number: [id number] ?Date of Birth/Sex: ?Treating RN: ?[birth year] (76 y.o. F) [name], [name] ?Primary Care Racine Erby: [name] ?Other Clinician: ?Referring Kaytelynn Scripter: ?Treating Yomaira Solar/Extender: Fredirick Maudlin ?[name] ?Weeks in Treatment: 0 ?Vital Signs ?Height(in): 62 ?Capillary Blood Glucose(mg/dl): 124 ?Weight(lbs): 114 ?Pulse(bpm): 76 ?Body Mass Index(BMI): 20.8 ?Blood Pressure(mmHg): 135/62 ?Temperature(??F): 97.9 ?Respiratory Rate(breaths/min): 18 ?Photos: [N/A:N/A] ?Right Gluteus N/A N/A ?Wound Location: ?Pressure Injury N/A N/A ?Wounding Event: ?Pressure Ulcer N/A N/A ?Primary Etiology: ?Cataracts, Anemia, Coronary Artery N/A N/A ?Comorbid History: ?Disease, Deep Vein Thrombosis, ?Hypertension, Myocardial Infarction, ?Type II Diabetes, Seizure Disorder, ?Anorexia/bulimia ?02/05/2022 N/A N/A ?Date Acquired: ?0 N/A N/A ?Weeks of Treatment: ?Open N/A N/A ?Wound Status: ?No N/A N/A ?Wound Recurrence: ?1.6x1.6x0.1 N/A N/A ?Measurements L x W x D (cm) ?2.011 N/A N/A ?A (cm?) : ?rea ?0.201 N/A N/A ?Volume (cm?) : ?0.00% N/A N/A ?% Reduction in A rea: ?0.00% N/A N/A ?% Reduction in Volume: ?Category/Stage III N/A N/A ?Classification: ?Medium N/A N/A ?Exudate A mount: ?Serous N/A N/A ?Exudate Type: ?amber N/A N/A ?Exudate Color: ?Distinct, outline attached N/A N/A ?Wound  Margin: ?Small (1-33%) N/A N/A ?Granulation A mount: ?Pink N/A N/A ?Granulation Quality: ?Large (67-100%) N/A N/A ?Necrotic A mount: ?Fat Layer (Subcutaneous Tissue): Yes N/A N/A ?Exposed Structures: ?Fascia: No ?Tendon: No

## 2022-03-05 NOTE — Progress Notes (Signed)
Delpino, Yerlin A. ([id number]) ?Visit Report for 03/05/2022 ?Abuse Risk Screen Details ?Patient Name: Date of Service: ?[name] [name], [name] [name] 03/05/2022 9:00 A M ?Medical Record Number: [id number] ?Patient Account Number: [id number] ?Date of Birth/Sex: Treating RN: ?07-24-1946 (76 y.o. F) [name], [name] ?Primary Care Fatmata Legere: [name] [name] Other Clinician: ?Referring Jospeh Mangel: ?Treating Vedanth Sirico/Extender: Fredirick Maudlin ?[name] [name] ?Weeks in Treatment: 0 ?Abuse Risk Screen Items ?Answer ?ABUSE RISK SCREEN: ?Has anyone close to you tried to hurt or harm you recentlyo No ?Do you feel uncomfortable with anyone in your familyo No ?Has anyone forced you do things that you didnt want to doo No ?Electronic Signature(s) ?Signed: 03/05/2022 5:38:18 PM By: Baruch Gouty RN, BSN ?Entered By: Baruch Gouty on 03/05/2022 09:28:19 ?-------------------------------------------------------------------------------- ?Activities of Daily Living Details ?Patient Name: Date of Service: ?[name] [name], [name] [name] 03/05/2022 9:00 A M ?Medical Record Number: [id number] ?Patient Account Number: [id number] ?Date of Birth/Sex: Treating RN: ?January 04, 1946 (76 y.o. F) [name], [name] ?Primary Care Jaequan Propes: [name] [name] Other Clinician: ?Referring Jean Alejos: ?Treating Tal Neer/Extender: Fredirick Maudlin ?[name] [name] ?Weeks in Treatment: 0 ?Activities of Daily Living Items ?Answer ?Activities of Daily Living (Please select one for each item) ?[place] ?T Medications ?ake Completely Able ?Use T elephone Completely Able ?Care for Appearance Completely Able ?Use T oilet Completely Able ?Bath / Shower Completely Able ?Dress Self Completely Able ?Feed Self Completely Able ?Walk Completely Able ?Get In / Out Bed Completely Able ?Housework Completely Able ?Prepare Meals Completely Able ?Handle Money Completely Able ?Shop for Self Completely Able ?Electronic Signature(s) ?Signed: 03/05/2022 5:38:18 PM By: Baruch Gouty RN, BSN ?Entered By:  Baruch Gouty on 03/05/2022 35:00:93 ?-------------------------------------------------------------------------------- ?Education Screening Details ?Patient Name: ?Date of Service: ?[name] [name], [name] [name] 03/05/2022 9:00 A M ?Medical Record Number: [id number] ?Patient Account Number: [id number] ?Date of Birth/Sex: ?Treating RN: ?1946-01-01 (76 y.o. F) [name], [name] ?Primary Care Anamaria Dusenbury: [name] [name] ?Other Clinician: ?Referring Xylia Scherger: ?Treating Elyn Krogh/Extender: Fredirick Maudlin ?[name] [name] ?Weeks in Treatment: 0 ?Primary Learner Assessed: Patient ?Learning Preferences/Education Level/Primary Language ?Learning Preference: Explanation, Demonstration, Printed Material ?Highest Education Level: High School ?Preferred Language: English ?Cognitive Barrier ?Language Barrier: No ?Translator Needed: No ?Memory Deficit: No ?Emotional Barrier: No ?Cultural/Religious Beliefs Affecting Medical Care: No ?Physical Barrier ?Impaired Vision: Yes Glasses ?Impaired Hearing: No ?Decreased Hand dexterity: No ?Knowledge/Comprehension ?Knowledge Level: High ?Comprehension Level: High ?Ability to understand written instructions: High ?Ability to understand verbal instructions: High ?Motivation ?Anxiety Level: Calm ?Cooperation: Cooperative ?Education Importance: Acknowledges Need ?Interest in Health Problems: Asks Questions ?Perception: Coherent ?Willingness to Engage in Self-Management High ?Activities: ?Readiness to Engage in Self-Management High ?Activities: ?Electronic Signature(s) ?Signed: 03/05/2022 5:38:18 PM By: Baruch Gouty RN, BSN ?Entered By: Baruch Gouty on 03/05/2022 09:29:16 ?-------------------------------------------------------------------------------- ?Fall Risk Assessment Details ?Patient Name: ?Date of Service: ?[name] [name], [name] [name] 03/05/2022 9:00 A M ?Medical Record Number: [id number] ?Patient Account Number: [id number] ?Date of Birth/Sex: ?Treating RN: ?1946-08-19 (76 y.o. F) [name], [name] ?Primary Care  Naome Brigandi: [name] [name] ?Other Clinician: ?Referring Huberta Tompkins: ?Treating Dalayza Zambrana/Extender: Fredirick Maudlin ?[name] [name] ?Weeks in Treatment: 0 ?Fall Risk Assessment Items ?Have you had 2 or more falls in the last 12 monthso 0 No ?Have you had any fall that resulted in injury in the last 12 monthso 0 No ?FALLS RISK SCREEN ?History of falling - immediate or within 3 months 25 Yes ?Secondary diagnosis (Do you have 2 or more medical diagnoseso) 0 No ?Ambulatory aid ?None/bed rest/wheelchair/nurse 0 No ?Crutches/cane/walker 0 No ?Furniture 0 No ?Intravenous therapy Access/Saline/Heparin Lock 0 No ?Gait/Transferring ?Normal/  bed rest/ wheelchair 0 No ?Weak (short steps with or without shuffle, stooped but able to lift head while walking, may seek 0 No ?support from furniture) ?Impaired (short steps with shuffle, may have difficulty arising from chair, head down, impaired 0 No ?balance) ?Mental Status ?Oriented to own ability 0 No ?Electronic Signature(s) ?Signed: 03/05/2022 5:38:18 PM By: Baruch Gouty RN, BSN ?Entered By: Baruch Gouty on 03/05/2022 09:30:13 ?-------------------------------------------------------------------------------- ?Foot Assessment Details ?Patient Name: ?Date of Service: ?[name] [name], [name] [name] 03/05/2022 9:00 A M ?Medical Record Number: [id number] ?Patient Account Number: [id number] ?Date of Birth/Sex: ?Treating RN: ?1946-01-02 (76 y.o. F) [name], [name] ?Primary Care Stephie Xu: [name] [name] ?Other Clinician: ?Referring Tiaja Hagan: ?Treating Camaria Gerald/Extender: Fredirick Maudlin ?[name] [name] ?Weeks in Treatment: 0 ?Foot Assessment Items ?Site Locations ?+ = Sensation present, - = Sensation absent, C = Callus, U = Ulcer ?R = Redness, W = Warmth, M = Maceration, PU = Pre-ulcerative lesion ?F = Fissure, S = Swelling, D = Dryness ?Assessment ?Right: Left: ?Other Deformity: No No ?Prior Foot Ulcer: No No ?Prior Amputation: No No ?Charcot Joint: No No ?Ambulatory Status: Ambulatory Without Help ?Gait:  Steady ?Electronic Signature(s) ?Signed: 03/05/2022 5:38:18 PM By: Baruch Gouty RN, BSN ?Entered By: Baruch Gouty on 03/05/2022 09:31:18 ?-------------------------------------------------------------------------------- ?Nutrition Risk Screening Details ?Patient Name: ?Date of Service: ?[name] [name], [name] [name] 03/05/2022 9:00 A M ?Medical Record Number: [id number] ?Patient Account Number: [id number] ?Date of Birth/Sex: ?Treating RN: ?1945-12-27 (76 y.o. F) [name], [name] ?Primary Care Aariah Godette: [name] [name] ?Other Clinician: ?Referring Ermin Parisien: ?Treating Briant Angelillo/Extender: Fredirick Maudlin ?[name] [name] ?Weeks in Treatment: 0 ?Height (in): 62 ?Weight (lbs): 114 ?Body Mass Index (BMI): 20.8 ?Nutrition Risk Screening Items ?Score Screening ?NUTRITION RISK SCREEN: ?I have an illness or condition that made me change the kind and/or amount of food I eat 0 No ?I eat fewer than two meals per day 0 No ?I eat few fruits and vegetables, or milk products 0 No ?I have three or more drinks of beer, liquor or wine almost every day 0 No ?I have tooth or mouth problems that make it hard for me to eat 0 No ?I don't always have enough money to buy the food I need 0 No ?I eat alone most of the time 1 Yes ?I take three or more different prescribed or over-the-counter drugs a day 0 No ?Without wanting to, I have lost or gained 10 pounds in the last six months 2 Yes ?I am not always physically able to shop, cook and/or feed myself 0 No ?Nutrition Protocols ?Good Risk Protocol ?Moderate Risk Protocol 0 Provide education on nutrition ?High Risk Proctocol ?Risk Level: Moderate Risk ?Score: 3 ?Electronic Signature(s) ?Signed: 03/05/2022 5:38:18 PM By: Baruch Gouty RN, BSN ?Entered By: Baruch Gouty on 03/05/2022 09:31:11 ?

## 2022-03-10 ENCOUNTER — Telehealth: Payer: Self-pay | Admitting: Internal Medicine

## 2022-03-10 NOTE — Telephone Encounter (Signed)
Called patient regarding upcoming appointment, patient is notified. °

## 2022-03-11 ENCOUNTER — Inpatient Hospital Stay: Payer: Medicare Other | Attending: Internal Medicine

## 2022-03-11 ENCOUNTER — Other Ambulatory Visit: Payer: Self-pay

## 2022-03-11 ENCOUNTER — Inpatient Hospital Stay (HOSPITAL_BASED_OUTPATIENT_CLINIC_OR_DEPARTMENT_OTHER): Payer: Medicare Other | Admitting: Internal Medicine

## 2022-03-11 ENCOUNTER — Encounter (HOSPITAL_BASED_OUTPATIENT_CLINIC_OR_DEPARTMENT_OTHER): Payer: Medicare Other | Admitting: General Surgery

## 2022-03-11 VITALS — BP 117/47 | HR 70 | Temp 97.6°F | Resp 16 | Wt 116.8 lb

## 2022-03-11 DIAGNOSIS — D473 Essential (hemorrhagic) thrombocythemia: Secondary | ICD-10-CM

## 2022-03-11 DIAGNOSIS — Z79899 Other long term (current) drug therapy: Secondary | ICD-10-CM | POA: Insufficient documentation

## 2022-03-11 DIAGNOSIS — Z7901 Long term (current) use of anticoagulants: Secondary | ICD-10-CM | POA: Diagnosis not present

## 2022-03-11 DIAGNOSIS — I2510 Atherosclerotic heart disease of native coronary artery without angina pectoris: Secondary | ICD-10-CM | POA: Insufficient documentation

## 2022-03-11 DIAGNOSIS — Z86718 Personal history of other venous thrombosis and embolism: Secondary | ICD-10-CM | POA: Insufficient documentation

## 2022-03-11 DIAGNOSIS — E785 Hyperlipidemia, unspecified: Secondary | ICD-10-CM | POA: Diagnosis not present

## 2022-03-11 DIAGNOSIS — D75839 Thrombocytosis, unspecified: Secondary | ICD-10-CM | POA: Insufficient documentation

## 2022-03-11 DIAGNOSIS — I10 Essential (primary) hypertension: Secondary | ICD-10-CM | POA: Insufficient documentation

## 2022-03-11 DIAGNOSIS — I252 Old myocardial infarction: Secondary | ICD-10-CM | POA: Diagnosis not present

## 2022-03-11 DIAGNOSIS — Z7984 Long term (current) use of oral hypoglycemic drugs: Secondary | ICD-10-CM | POA: Diagnosis not present

## 2022-03-11 DIAGNOSIS — E11622 Type 2 diabetes mellitus with other skin ulcer: Secondary | ICD-10-CM | POA: Diagnosis not present

## 2022-03-11 DIAGNOSIS — D72829 Elevated white blood cell count, unspecified: Secondary | ICD-10-CM | POA: Insufficient documentation

## 2022-03-11 DIAGNOSIS — E43 Unspecified severe protein-calorie malnutrition: Secondary | ICD-10-CM | POA: Diagnosis not present

## 2022-03-11 DIAGNOSIS — D649 Anemia, unspecified: Secondary | ICD-10-CM | POA: Insufficient documentation

## 2022-03-11 DIAGNOSIS — E119 Type 2 diabetes mellitus without complications: Secondary | ICD-10-CM | POA: Insufficient documentation

## 2022-03-11 DIAGNOSIS — L89313 Pressure ulcer of right buttock, stage 3: Secondary | ICD-10-CM | POA: Diagnosis not present

## 2022-03-11 DIAGNOSIS — D471 Chronic myeloproliferative disease: Secondary | ICD-10-CM | POA: Insufficient documentation

## 2022-03-11 DIAGNOSIS — D6489 Other specified anemias: Secondary | ICD-10-CM | POA: Diagnosis not present

## 2022-03-11 LAB — FERRITIN: Ferritin: 288 ng/mL (ref 11–307)

## 2022-03-11 LAB — CBC WITH DIFFERENTIAL (CANCER CENTER ONLY)
Abs Immature Granulocytes: 0.02 10*3/uL (ref 0.00–0.07)
Basophils Absolute: 0 10*3/uL (ref 0.0–0.1)
Basophils Relative: 0 %
Eosinophils Absolute: 0 10*3/uL (ref 0.0–0.5)
Eosinophils Relative: 1 %
HCT: 27.1 % — ABNORMAL LOW (ref 36.0–46.0)
Hemoglobin: 8.6 g/dL — ABNORMAL LOW (ref 12.0–15.0)
Immature Granulocytes: 1 %
Lymphocytes Relative: 18 %
Lymphs Abs: 0.6 10*3/uL — ABNORMAL LOW (ref 0.7–4.0)
MCH: 35.8 pg — ABNORMAL HIGH (ref 26.0–34.0)
MCHC: 31.7 g/dL (ref 30.0–36.0)
MCV: 112.9 fL — ABNORMAL HIGH (ref 80.0–100.0)
Monocytes Absolute: 0.2 10*3/uL (ref 0.1–1.0)
Monocytes Relative: 6 %
Neutro Abs: 2.5 10*3/uL (ref 1.7–7.7)
Neutrophils Relative %: 74 %
Platelet Count: 575 10*3/uL — ABNORMAL HIGH (ref 150–400)
RBC: 2.4 MIL/uL — ABNORMAL LOW (ref 3.87–5.11)
RDW: 18.6 % — ABNORMAL HIGH (ref 11.5–15.5)
WBC Count: 3.4 10*3/uL — ABNORMAL LOW (ref 4.0–10.5)
nRBC: 0 % (ref 0.0–0.2)

## 2022-03-11 LAB — IRON AND IRON BINDING CAPACITY (CC-WL,HP ONLY)
Iron: 43 ug/dL (ref 28–170)
Saturation Ratios: 13 % (ref 10.4–31.8)
TIBC: 343 ug/dL (ref 250–450)
UIBC: 300 ug/dL (ref 148–442)

## 2022-03-11 MED ORDER — HYDROXYUREA 500 MG PO CAPS: ORAL_CAPSULE | ORAL | 2 refills | Status: AC

## 2022-03-11 NOTE — Progress Notes (Signed)
?    [facility] ?[phone]   [phone] ? ?OFFICE PROGRESS NOTE ? ?[name] Baton, MD ?[address] ?[place] [address] ? ?DIAGNOSIS: Essential thrombocythemia with positive JAK2 mutation [id number] diagnosed in October 2020. ? ?PRIOR THERAPY: None ? ?CURRENT THERAPY: Hydroxyurea 500 mg p.o. daily.  ? ?INTERVAL HISTORY: ?[name] [name] 76 y.o. female returns to the clinic today for follow-up visit accompanied by her daughter [name] and her granddaughter Nicolette.  The patient is feeling fine today with no concerning complaints except for the sacral area pain after debridement at the wound clinic earlier today.  She denied having any current chest pain, shortness of breath, cough or hemoptysis.  She denied having any nausea, vomiting, diarrhea or constipation.  She has no headache or visual changes.  She continues to tolerate her treatment with hydroxyurea fairly well.  She is here today for evaluation and repeat blood work. ?   ?MEDICAL HISTORY: ?Past Medical History:  ?Diagnosis Date  ? Anemia   ? Coronary artery disease   ? s/p stent to LAD and LCx  ? DM II (diabetes mellitus, type II), controlled ([place])   ? DVT (deep venous thrombosis) ([place])   ? Elevated LFTs   ? Hyperlipidemia   ? Hypertension   ? Memory loss   ? Myocardial infarction [facility])   ? 7 yrs ago  ? ? ?ALLERGIES:  is allergic to ampicillin, avelox [moxifloxacin hcl in nacl], bactrim [sulfamethoxazole-trimethoprim], ibuprofen, penicillins, diphenhydramine, sulfa antibiotics, codeine, latex, and whey. ? ?MEDICATIONS:  ?Current Outpatient Medications  ?Medication Sig Dispense Refill  ? fenofibrate micronized (LOFIBRA) 200 MG capsule TAKE ONE CAPSULE BY MOUTH ONCE DAILY BEFORE BREAKFAST 90 capsule 2  ? ferrous sulfate 325 (65 FE) MG EC tablet Take 325 mg by mouth as needed.    ? Fish Oil-Cholecalciferol (FISH OIL + D3) 1000-1000 MG-UNIT CAPS Take 1 tablet by mouth 3 (three) times daily.    ? hydroxyurea (HYDREA) 500 MG  capsule TAKE 1 CAPSULE BY MOUTH ONCE DAILY. MAY TAKE WITH FOOD TO  MINIMIZE  GI  SIDE  EFFECTS. 30 capsule 0  ? levETIRAcetam (KEPPRA) 250 MG tablet Take 1 tablet (250 mg total) by mouth 2 (two) times daily. 180 tablet 4  ? metFORMIN (GLUCOPHAGE) 500 MG tablet Take 1 tablet by mouth daily.    ? nitroGLYCERIN (NITROSTAT) 0.4 MG SL tablet Place 1 tablet (0.4 mg total) under the tongue every 5 (five) minutes as needed. For chest pain. 25 tablet 0  ? omeprazole (PRILOSEC) 40 MG capsule Take by mouth.    ? prochlorperazine (COMPAZINE) 10 MG tablet Take 1 tablet (10 mg total) by mouth every 6 (six) hours as needed for nausea or vomiting. 30 tablet 2  ? rosuvastatin (CRESTOR) 20 MG tablet Take 10 mg by mouth daily.    ? warfarin (COUMADIN) 2 MG tablet Take 2 mg by mouth daily.    ? Zinc 25 MG TABS Take 1 tablet by mouth 2 (two) times daily.    ? ?No current facility-administered medications for this visit.  ? ? ?SURGICAL HISTORY:  ?Past Surgical History:  ?Procedure Laterality Date  ? CATARACT EXTRACTION    ? [place]  ? CORONARY ANGIOPLASTY WITH STENT PLACEMENT  04/26/2003  ? NORMAL. EF 65%  ? I & D EXTREMITY  06/22/2012  ? Procedure: IRRIGATION AND DEBRIDEMENT EXTREMITY;  Surgeon: Tennis Must, MD;  Location: [facility];  Service: Orthopedics;  Laterality: Left;  ? I &  D EXTREMITY  06/29/2012  ? Procedure: IRRIGATION AND DEBRIDEMENT EXTREMITY;  Surgeon: Tennis Must, MD;  Location: [place];  Service: Orthopedics;  Laterality: Left;  ? OPEN REDUCTION INTERNAL FIXATION (ORIF) DISTAL RADIAL FRACTURE Right 02/04/2018  ? Procedure: OPEN REDUCTION INTERNAL FIXATION (ORIF) RIGHT DISTAL RADIAL FRACTURE;  Surgeon: Leanora Cover, MD;  Location: [place];  Service: Orthopedics;  Laterality: Right;  ? ? ?REVIEW OF SYSTEMS:  A comprehensive review of systems was negative except for: Constitutional: positive for fatigue ?Musculoskeletal: positive for back pain  ? ?PHYSICAL EXAMINATION: General appearance: alert,  cooperative, fatigued, and no distress ?Head: Normocephalic, without obvious abnormality, atraumatic ?Neck: no adenopathy, no JVD, supple, symmetrical, trachea midline, and thyroid not enlarged, symmetric, no tenderness/mass/nodules ?Lymph nodes: Cervical, supraclavicular, and axillary nodes normal. ?Resp: clear to auscultation bilaterally ?Back: symmetric, no curvature. ROM normal. No CVA tenderness. ?Cardio: regular rate and rhythm, S1, S2 normal, no murmur, click, rub or gallop ?GI: soft, non-tender; bowel sounds normal; no masses,  no organomegaly ?Extremities: extremities normal, atraumatic, no cyanosis or edema ? ?ECOG PERFORMANCE STATUS: 1 - Symptomatic but completely ambulatory ? ?Blood pressure (!) 117/47, pulse 70, temperature 97.6 ?F (36.4 ?C), temperature source Axillary, resp. rate 16, weight 116 lb 12.8 oz (53 kg), SpO2 100 %. ? ?LABORATORY DATA: ?Lab Results  ?Component Value Date  ? WBC 3.4 (L) 03/11/2022  ? HGB 8.6 (L) 03/11/2022  ? HCT 27.1 (L) 03/11/2022  ? MCV 112.9 (H) 03/11/2022  ? PLT 575 (H) 03/11/2022  ? ? ?  Chemistry   ?   ?Component Value Date/Time  ? NA 140 01/13/2022 1333  ? NA 141 01/29/2021 1107  ? K 4.1 01/13/2022 1333  ? CL 106 01/13/2022 1333  ? CO2 28 01/13/2022 1333  ? BUN 16 01/13/2022 1333  ? BUN 17 01/29/2021 1107  ? CREATININE 0.75 01/13/2022 1333  ?    ?Component Value Date/Time  ? CALCIUM 8.9 01/13/2022 1333  ? ALKPHOS 117 01/13/2022 1333  ? AST 21 01/13/2022 1333  ? ALT 28 01/13/2022 1333  ? BILITOT 0.8 01/13/2022 1333  ?  ? ? ? ?RADIOGRAPHIC STUDIES: ?No results found. ? ?ASSESSMENT AND PLAN: This is a very pleasant 76 years old white female recently diagnosed with essential thrombocythemia with positive JAK2 mutation.  The patient also has anemia and leukocytosis secondary to the myeloproliferative disorder. ?Over the last 4 weeks she was on treatment with hydroxyurea 500 mg p.o. daily. ?The patient has been tolerating her treatment with hydroxyurea fairly well. ?Repeat  CBC today showed improvement of the total white blood count up to 3.4.  Hemoglobin was better at 8.6 but platelets count increased to 575,000. ?I recommended for the patient to continue her current treatment with hydroxyurea 500 mg p.o. daily except on Monday and Thursday she will be on 1000 mg. ?I will see her back for follow-up visit in around 5 weeks for evaluation and repeat CBC. ?Her iron study and ferritin are still pending for today and if there is significant deficiency I will call the patient with additional recommendation. ?She was advised to call immediately if she has any other concerning symptoms in the interval. ? ?The patient voices understanding of current disease status and treatment options and is in agreement with the current care plan. ? ?All questions were answered. The patient knows to call the clinic with any problems, questions or concerns. We can certainly see the patient much sooner if necessary. ? ?Disclaimer: This note was dictated with  voice recognition software. Similar sounding words can inadvertently be transcribed and may not be corrected upon review. ? ? ?  ?  ?

## 2022-03-13 NOTE — Progress Notes (Signed)
[name], [name] A. ([id number]) ?Visit Report for 03/11/2022 ?Arrival Information Details ?Patient Name: Date of Service: ?[name] [name], [name] A. 03/11/2022 11:15 A M ?Medical Record Number: [id number] ?Patient Account Number: [id number] ?Date of Birth/Sex: Treating RN: ?1946/02/22 (76 y.o. [name] [name], [name] ?Primary Care Kmari Brian: [name] Other Clinician: ?Referring Kaelee Pfeffer: ?Treating Lastacia Solum/Extender: Fredirick Maudlin ?[name] ?Weeks in Treatment: 0 ?Visit Information History Since Last Visit ?Added or deleted any medications: No ?Patient Arrived: Ambulatory ?Any new allergies or adverse reactions: No ?Arrival Time: 11:25 ?Had a fall or experienced change in No ?Accompanied By: daughter/granddtr ?activities of daily living that may affect ?Transfer Assistance: None ?risk of falls: ?Patient Identification Verified: Yes ?Signs or symptoms of abuse/neglect since last visito No ?Secondary Verification Process Completed: Yes ?Hospitalized since last visit: No ?Patient Requires Transmission-Based Precautions: No ?Implantable device outside of the clinic excluding No ?Patient Has Alerts: Yes ?cellular tissue based products placed in the center ?Patient Alerts: Patient on Blood Thinner since last visit: ?Has Dressing in Place as Prescribed: Yes ?Pain Present Now: Yes ?Electronic Signature(s) ?Signed: 03/13/2022 5:21:10 PM By: Levan Hurst RN, BSN ?Entered By: Levan Hurst on 03/11/2022 11:26:15 ?-------------------------------------------------------------------------------- ?Encounter Discharge Information Details ?Patient Name: Date of Service: ?[name] [name], [name] A. 03/11/2022 11:15 A M ?Medical Record Number: [id number] ?Patient Account Number: [id number] ?Date of Birth/Sex: Treating RN: ?05-13-1946 (76 y.o. [name] [name], [name] ?Primary Care Jarek Longton: [name] Other Clinician: ?Referring Jalasia Eskridge: ?Treating Aubreana Cornacchia/Extender: Fredirick Maudlin ?[name] ?Weeks in Treatment: 0 ?Encounter Discharge Information Items  Post Procedure Vitals ?Discharge Condition: Stable ?Temperature (F): 97.7 ?Ambulatory Status: Ambulatory ?Pulse (bpm): 69 ?Discharge Destination: Home ?Respiratory Rate (breaths/min): 18 ?Transportation: Private Auto ?Blood Pressure (mmHg): 126/64 ?Accompanied By: daughter ?Schedule Follow-up Appointment: Yes ?Clinical Summary of Care: Patient Declined ?Electronic Signature(s) ?Signed: 03/13/2022 5:21:10 PM By: Levan Hurst RN, BSN ?Entered By: Levan Hurst on 03/11/2022 11:59:00 ?-------------------------------------------------------------------------------- ?Multi Wound Chart Details ?Patient Name: ?Date of Service: ?[name] [name], [name] [name] 03/11/2022 11:15 A M ?Medical Record Number: [id number] ?Patient Account Number: [id number] ?Date of Birth/Sex: ?Treating RN: ?[birth year] (76 y.o. F) [name], [name] ?Primary Care Audiel Scheiber: [name] ?Other Clinician: ?Referring Daylah Sayavong: ?Treating Ewelina Naves/Extender: Fredirick Maudlin ?[name] ?Weeks in Treatment: 0 ?Vital Signs ?Height(in): 62 ?Capillary Blood Glucose(mg/dl): 129 ?Weight(lbs): 114 ?Pulse(bpm): 69 ?Body Mass Index(BMI): 20.8 ?Blood Pressure(mmHg): 126/64 ?Temperature(??F): 97.7 ?Respiratory Rate(breaths/min): 18 ?Photos: [N/A:N/A] ?Right Gluteus N/A N/A ?Wound Location: ?Pressure Injury N/A N/A ?Wounding Event: ?Pressure Ulcer N/A N/A ?Primary Etiology: ?Cataracts, Anemia, Coronary Artery N/A N/A ?Comorbid History: ?Disease, Deep Vein Thrombosis, ?Hypertension, Myocardial Infarction, ?Type II Diabetes, Seizure Disorder, ?Anorexia/bulimia ?02/05/2022 N/A N/A ?Date Acquired: ?0 N/A N/A ?Weeks of Treatment: ?Open N/A N/A ?Wound Status: ?No N/A N/A ?Wound Recurrence: ?2.1x1.3x0.1 N/A N/A ?Measurements L x W x D (cm) ?2.144 N/A N/A ?A (cm?) : ?rea ?0.214 N/A N/A ?Volume (cm?) : ?-6.60% N/A N/A ?% Reduction in A rea: ?-6.50% N/A N/A ?% Reduction in Volume: ?Category/Stage III N/A N/A ?Classification: ?Medium N/A N/A ?Exudate A mount: ?Serous N/A N/A ?Exudate  Type: ?amber N/A N/A ?Exudate Color: ?Distinct, outline attached N/A N/A ?Wound Margin: ?Small (1-33%) N/A N/A ?Granulation A mount: ?Pink N/A N/A ?Granulation Quality: ?Large (67-100%) N/A N/A ?Necrotic A mount: ?Fat Layer (Subcutaneous Tissue): Yes N/A N/A ?Exposed Structures: ?Fascia: No ?Tendon: No ?Muscle: No ?Joint: No ?Bone: No ?Small (1-33%) N/A N/A ?Epithelialization: ?Debridement - Selective/Open Wound N/A N/A ?Debridement: ?Pre-procedure Verification/Time Out 11:39 N/A N/A ?Taken: ?[facility] N/A N/A ?Tissue Debrided: ?Non-Viable Tissue N/A N/A ?Level: ?2.73 N/A N/A ?  Debridement A (sq cm): ?rea ?Curette N/A N/A ?Instrument: ?Minimum N/A N/A ?Bleeding: ?Pressure N/A N/A ?Hemostasis A chieved: ?4 N/A N/A ?Procedural Pain: ?2 N/A N/A ?Post Procedural Pain: ?Procedure was tolerated well N/A N/A ?Debridement Treatment Response: ?2.1x1.3x0.1 N/A N/A ?Post Debridement Measurements L x ?W x D (cm) ?0.214 N/A N/A ?Post Debridement Volume: (cm?) ?Category/Stage III N/A N/A ?Post Debridement Stage: ?Debridement N/A N/A ?Procedures Performed: ?Treatment Notes ?Electronic Signature(s) ?Signed: 03/11/2022 11:47:17 AM By: Fredirick Maudlin MD FACS ?Signed: 03/12/2022 5:37:56 PM By: Baruch Gouty RN, BSN ?Entered By: Fredirick Maudlin on 03/11/2022 11:47:17 ?-------------------------------------------------------------------------------- ?Multi-Disciplinary Care Plan Details ?Patient Name: ?Date of Service: ?[name] [name], [name] [name] 03/11/2022 11:15 A M ?Medical Record Number: [id number] ?Patient Account Number: [id number] ?Date of Birth/Sex: ?Treating RN: ?Jan 02, 1946 (76 y.o. [name] [name], [name] ?Primary Care Juliza Machnik: [name] ?Other Clinician: ?Referring Derwin Reddy: ?Treating Donel Osowski/Extender: Fredirick Maudlin ?[name] ?Weeks in Treatment: 0 ?Multidisciplinary Care Plan reviewed with physician ?Active Inactive ?Abuse / Safety / Falls / Self Care Management ?Nursing Diagnoses: ?History of Falls ?Potential for  falls ?Goals: ?Patient/caregiver will verbalize/demonstrate measures taken to prevent injury and/or falls ?Date Initiated: 03/05/2022 ?Target Resolution Date: 04/02/2022 ?Goal Status: Active ?Interventions: ?Assess fall risk on admission and as needed ?Assess impairment of mobility on admission and as needed per policy ?Notes: ?Nutrition ?Nursing Diagnoses: ?Imbalanced nutrition ?Impaired glucose control: actual or potential ?Potential for alteratiion in Nutrition/Potential for imbalanced nutrition ?Goals: ?Patient/caregiver will maintain therapeutic glucose control ?Date Initiated: 03/05/2022 ?Target Resolution Date: 04/02/2022 ?Goal Status: Active ?Interventions: ?Assess patient nutrition upon admission and as needed per policy ?Provide education on elevated blood sugars and impact on wound healing ?Provide education on nutrition ?Treatment Activities: ?Dietary management education, guidance and counseling : 03/05/2022 ?Education provided on Nutrition : 03/05/2022 ?Giving encouragement to exercise : 03/05/2022 ?Patient referred to Primary Care Physician for further nutritional evaluation : 03/05/2022 ?Notes: ?Wound/Skin Impairment ?Nursing Diagnoses: ?Impaired tissue integrity ?Knowledge deficit related to ulceration/compromised skin integrity ?Goals: ?Patient/caregiver will verbalize understanding of skin care regimen ?Date Initiated: 03/05/2022 ?Target Resolution Date: 04/02/2022 ?Goal Status: Active ?Ulcer/skin breakdown will have a volume reduction of 30% by week 4 ?Date Initiated: 03/05/2022 ?Target Resolution Date: 04/02/2022 ?Goal Status: Active ?Interventions: ?Assess patient/caregiver ability to obtain necessary supplies ?Assess patient/caregiver ability to perform ulcer/skin care regimen upon admission and as needed ?Assess ulceration(s) every visit ?Provide education on ulcer and skin care ?Treatment Activities: ?Skin care regimen initiated : 03/05/2022 ?Topical wound management initiated : 03/05/2022 ?Notes: ?Electronic  Signature(s) ?Signed: 03/13/2022 5:21:10 PM By: Levan Hurst RN, BSN ?Entered By: Levan Hurst on 03/11/2022 11:38:44 ?-------------------------------------------------------------------------------- ?Pain Assessment Details ?Patient N

## 2022-03-13 NOTE — Progress Notes (Signed)
Popelka, Tamicka A. ([id number]) ?Visit Report for 03/11/2022 ?Chief Complaint Document Details ?Patient Name: Date of Service: ?[name] [name], [name] A. 03/11/2022 11:15 A M ?Medical Record Number: [id number] ?Patient Account Number: [id number] ?Date of Birth/Sex: Treating RN: ?30-Aug-1946 (76 y.o. F) [name] [name] ?Primary Care Provider: Shon [name] Other Clinician: ?Referring Provider: ?Treating Provider/Extender: [name] [name] ?[name] [name] ?Weeks in Treatment: 0 ?Information Obtained from: Patient ?Chief Complaint ?Patient is at the clinic for treatment of an open pressure ulcer ?Electronic Signature(s) ?Signed: 03/11/2022 11:47:23 AM By: [name] Maudlin MD FACS ?Entered By: [name] [name] on 03/11/2022 11:47:23 ?-------------------------------------------------------------------------------- ?Debridement Details ?Patient Name: Date of Service: ?[name] [name], [name] A. 03/11/2022 11:15 A M ?Medical Record Number: [id number] ?Patient Account Number: [id number] ?Date of Birth/Sex: Treating RN: ?[birth year] (76 y.o. [name] [name] ?Primary Care Provider: Shon [name] Other Clinician: ?Referring Provider: ?Treating Provider/Extender: [name] [name] ?[name] [name] ?Weeks in Treatment: 0 ?Debridement Performed for Assessment: Wound #1 Right Gluteus ?Performed By: Physician [name] Maudlin, MD ?Debridement Type: Debridement ?Level of Consciousness (Pre-procedure): Awake and Alert ?Pre-procedure Verification/Time Out Yes - 11:39 ?Taken: ?Start Time: 11:39 ?T Area Debrided (L x W): ?otal 2.1 (cm) x 1.3 (cm) = 2.73 (cm?) ?Tissue and other material debrided: Non-Viable, [name] [name] ?Level: Non-Viable Tissue ?Debridement Description: Selective/Open Wound ?Instrument: Curette ?Bleeding: Minimum ?Hemostasis Achieved: Pressure ?End Time: 11:40 ?Procedural Pain: 4 ?Post Procedural Pain: 2 ?Response to Treatment: Procedure was tolerated well ?Level of Consciousness (Post- Awake and Alert ?procedure): ?Post Debridement Measurements of Total  Wound ?Length: (cm) 2.1 ?Stage: Category/Stage III ?Width: (cm) 1.3 ?Depth: (cm) 0.1 ?Volume: (cm?) 0.214 ?Character of Wound/Ulcer Post Debridement: Requires Further Debridement ?Post Procedure Diagnosis ?Same as Pre-procedure ?Electronic Signature(s) ?Signed: 03/11/2022 11:52:00 AM By: [name] Maudlin MD FACS ?Signed: 03/13/2022 5:21:10 PM By: [name] Hurst RN, BSN ?Entered By: [name] [name] on 03/11/2022 11:40:40 ?-------------------------------------------------------------------------------- ?HPI Details ?Patient Name: Date of Service: ?[name] [name], [name] A. 03/11/2022 11:15 A M ?Medical Record Number: [id number] ?Patient Account Number: [id number] ?Date of Birth/Sex: Treating RN: ?January 09, 1946 (76 y.o. F) [name] [name] ?Primary Care Provider: Shon [name] Other Clinician: ?Referring Provider: ?Treating Provider/Extender: [name] [name] ?[name] [name] ?Weeks in Treatment: 0 ?History of Present Illness ?HPI Description: ADMISSION ?03/05/2022 ?This is a 76 year old woman with a past medical history notable for type 2 diabetes mellitus, hypertension, coronary artery disease, and essential ?thrombocythemia. Her husband died fairly recently and in that time she has lost a substantial amount of weight. She is accompanied by one of her daughters ?who is a Marine scientist. Her other daughter is also a Marine scientist and lives next door. On February 05, 2022, the patient notified her daughters that she had a sore on her right ?buttock. Apparently 2 weeks prior to that, she fell in the yard and landed on her backside, but landed on grass and did not seem to sustain any obvious ?trauma. Since reporting the wound to her daughters, they have used some Santyl that they had leftover and have been using Medihoney since they ran out of ?that. She does not take insulin and her last hemoglobin A1c was reported to be 5.7. She is on hydroxyurea for essential thrombocythemia. On her right gluteus, ?there is a circular wound with thick fibrinous slough. There is  some visible pink tissue underneath the slough at the periphery of the wound. No odor or ?significant drainage. ?03/11/2022: The wound measured slightly larger today. It is a bit cleaner with less slough accumulation. There are buds of granulation tissue beginning to emerge. ?No concern for infection. ?Electronic  Signature(s) ?Signed: 03/11/2022 11:48:03 AM By: [name] Maudlin MD FACS ?Entered By: [name] [name] on 03/11/2022 11:48:03 ?-------------------------------------------------------------------------------- ?Physical Exam Details ?Patient Name: Date of Service: ?[name] [name], [name] A. 03/11/2022 11:15 A M ?Medical Record Number: [id number] ?Patient Account Number: [id number] ?Date of Birth/Sex: Treating RN: ?03/16/1946 (76 y.o. F) [name] [name] ?Primary Care Provider: Shon [name] Other Clinician: ?Referring Provider: ?Treating Provider/Extender: [name] [name] ?[name] [name] ?Weeks in Treatment: 0 ?Constitutional ?. . . . No acute distress. ?Respiratory ?Normal work of breathing on room air. ?Notes ?03/11/2022: The wound measured slightly larger today. It is a bit cleaner with less slough accumulation. There are buds of granulation tissue beginning to emerge. ?No concern for infection. ?Electronic Signature(s) ?Signed: 03/11/2022 11:48:48 AM By: [name] Maudlin MD FACS ?Entered By: [name] [name] on 03/11/2022 11:48:47 ?-------------------------------------------------------------------------------- ?Physician Orders Details ?Patient Name: ?Date of Service: ?[name] [name], [name] [name] 03/11/2022 11:15 A M ?Medical Record Number: [id number] ?Patient Account Number: [id number] ?Date of Birth/Sex: ?Treating RN: ?02/26/1946 (76 y.o. [name] [name] ?Primary Care Provider: Shon [name] ?Other Clinician: ?Referring Provider: ?Treating Provider/Extender: [name] [name] ?[name] [name] ?Weeks in Treatment: 0 ?Verbal / Phone Orders: No ?Diagnosis Coding ?ICD-10 Coding ?Code Description ?L89.313 Pressure ulcer of right buttock, stage  3 ?E11.622 Type 2 diabetes mellitus with other skin ulcer ?E43 Unspecified severe protein-calorie malnutrition ?D47.3 Essential (hemorrhagic) thrombocythemia ?Follow-up Appointments ?ppointment in 1 week. - Dr. Celine Ahr - Room 1 - Wednesday 5/17 at 11:15 ?Return A ?Bathing/ Shower/ Hygiene ?May shower and wash wound with soap and water. ?Off-Loading ?Other: - stand at least hourly during the day ?Additional Orders / Instructions ?Follow Nutritious Diet - add protein supplements to diet ?Wound Treatment ?Wound #1 - Gluteus Wound Laterality: Right ?Peri-Wound Care: Skin Prep 1 x Per Day/30 Days ?Discharge Instructions: Use skin prep as directed ?Prim Dressing: Santyl Ointment 1 x Per Day/30 Days ?ary ?Discharge Instructions: Apply nickel thick amount to wound bed as instructed ?Secondary Dressing: Zetuvit Plus Silicone Border Dressing 4x4 (in/in) 1 x Per Day/30 Days ?Discharge Instructions: Apply silicone border over primary dressing as directed. ?Electronic Signature(s) ?Signed: 03/11/2022 11:52:00 AM By: [name] Maudlin MD FACS ?Entered By: [name] [name] on 03/11/2022 11:50:26 ?-------------------------------------------------------------------------------- ?Problem List Details ?Patient Name: ?Date of Service: ?[name] [name], [name] [name] 03/11/2022 11:15 A M ?Medical Record Number: [id number] ?Patient Account Number: [id number] ?Date of Birth/Sex: ?Treating RN: ?06-09-1946 (76 y.o. [name] [name] ?Primary Care Provider: Shon [name] ?Other Clinician: ?Referring Provider: ?Treating Provider/Extender: [name] [name] ?[name] [name] ?Weeks in Treatment: 0 ?Active Problems ?ICD-10 ?Encounter ?Code Description Active Date MDM ?Diagnosis ?L89.313 Pressure ulcer of right buttock, stage 3 03/05/2022 No Yes ?E11.622 Type 2 diabetes mellitus with other skin ulcer 03/05/2022 No Yes ?E43 Unspecified severe protein-calorie malnutrition 03/05/2022 No Yes ?D47.3 Essential (hemorrhagic) thrombocythemia 03/05/2022 No Yes ?Inactive Problems ?Resolved  Problems ?Electronic Signature(s) ?Signed: 03/11/2022 11:47:11 AM By: [name] Maudlin MD FACS ?Entered By: [name] [name] on 03/11/2022 11:47:11 ?------------------------------------------------------

## 2022-03-19 ENCOUNTER — Encounter (HOSPITAL_BASED_OUTPATIENT_CLINIC_OR_DEPARTMENT_OTHER): Payer: Medicare Other | Admitting: General Surgery

## 2022-03-19 DIAGNOSIS — E11622 Type 2 diabetes mellitus with other skin ulcer: Secondary | ICD-10-CM | POA: Diagnosis not present

## 2022-03-19 DIAGNOSIS — I10 Essential (primary) hypertension: Secondary | ICD-10-CM | POA: Diagnosis not present

## 2022-03-19 DIAGNOSIS — E43 Unspecified severe protein-calorie malnutrition: Secondary | ICD-10-CM | POA: Diagnosis not present

## 2022-03-19 DIAGNOSIS — L89313 Pressure ulcer of right buttock, stage 3: Secondary | ICD-10-CM | POA: Diagnosis not present

## 2022-03-19 DIAGNOSIS — I2510 Atherosclerotic heart disease of native coronary artery without angina pectoris: Secondary | ICD-10-CM | POA: Diagnosis not present

## 2022-03-19 DIAGNOSIS — D473 Essential (hemorrhagic) thrombocythemia: Secondary | ICD-10-CM | POA: Diagnosis not present

## 2022-03-19 NOTE — Progress Notes (Signed)
[name], [name] A. ([id number]) ?Visit Report for 03/19/2022 ?Arrival Information Details ?Patient Name: Date of Service: ?[name] [name], [name] A. 03/19/2022 11:15 A M ?Medical Record Number: [id number] ?Patient Account Number: [id number] ?Date of Birth/Sex: Treating RN: ?1946/09/20 (76 y.o. F) [name], [name] ?Primary Care Cambrie Sonnenfeld: [name] Other Clinician: ?Referring Noelly Lasseigne: ?Treating Posey Jasmin/Extender: Fredirick Maudlin ?[name] ?Weeks in Treatment: 2 ?Visit Information History Since Last Visit ?Added or deleted any medications: No ?Patient Arrived: Ambulatory ?Any new allergies or adverse reactions: No ?Arrival Time: 11:27 ?Had a fall or experienced change in No ?Accompanied By: daughter ?activities of daily living that may affect ?Transfer Assistance: None ?risk of falls: ?Patient Identification Verified: Yes ?Signs or symptoms of abuse/neglect since last visito No ?Secondary Verification Process Completed: Yes ?Hospitalized since last visit: No ?Patient Requires Transmission-Based Precautions: No ?Implantable device outside of the clinic excluding No ?Patient Has Alerts: Yes ?cellular tissue based products placed in the center ?Patient Alerts: Patient on Blood Thinner since last visit: ?Has Dressing in Place as Prescribed: Yes ?Pain Present Now: Yes ?Electronic Signature(s) ?Signed: 03/19/2022 6:28:42 PM By: Baruch Gouty RN, BSN ?Entered By: Baruch Gouty on 03/19/2022 11:37:33 ?-------------------------------------------------------------------------------- ?Encounter Discharge Information Details ?Patient Name: Date of Service: ?[name] [name], [name] A. 03/19/2022 11:15 A M ?Medical Record Number: [id number] ?Patient Account Number: [id number] ?Date of Birth/Sex: Treating RN: ?08-May-1946 (76 y.o. F) [name], [name] ?Primary Care Abelina Ketron: [name] Other Clinician: ?Referring Candy Ziegler: ?Treating Devony Mcgrady/Extender: Fredirick Maudlin ?[name] ?Weeks in Treatment: 2 ?Encounter Discharge Information Items Post  Procedure Vitals ?Discharge Condition: Stable ?Temperature (F): 98.4 ?Ambulatory Status: Ambulatory ?Pulse (bpm): 68 ?Discharge Destination: Home ?Respiratory Rate (breaths/min): 18 ?Transportation: Private Auto ?Blood Pressure (mmHg): 116/52 ?Accompanied By: dAUGHTER ?Schedule Follow-up Appointment: Yes ?Clinical Summary of Care: Patient Declined ?Electronic Signature(s) ?Signed: 03/19/2022 6:28:42 PM By: Baruch Gouty RN, BSN ?Entered By: Baruch Gouty on 03/19/2022 12:08:43 ?-------------------------------------------------------------------------------- ?Lower Extremity Assessment Details ?Patient Name: ?Date of Service: ?[name] [name], [name] [name] 03/19/2022 11:15 A M ?Medical Record Number: [id number] ?Patient Account Number: [id number] ?Date of Birth/Sex: ?Treating RN: ?Jun 13, 1946 (76 y.o. F) [name], [name] ?Primary Care Moyses Pavey: [name] ?Other Clinician: ?Referring Virgilio Broadhead: ?Treating Zacharee Gaddie/Extender: Fredirick Maudlin ?[name] ?Weeks in Treatment: 2 ?Electronic Signature(s) ?Signed: 03/19/2022 6:28:42 PM By: Baruch Gouty RN, BSN ?Entered By: Baruch Gouty on 03/19/2022 11:43:14 ?-------------------------------------------------------------------------------- ?Multi Wound Chart Details ?Patient Name: ?Date of Service: ?[name] [name], [name] [name] 03/19/2022 11:15 A M ?Medical Record Number: [id number] ?Patient Account Number: [id number] ?Date of Birth/Sex: ?Treating RN: ?November 13, 1945 (76 y.o. F) [name], [name] ?Primary Care Danamarie Minami: [name] ?Other Clinician: ?Referring Latica Hohmann: ?Treating Kendalynn Wideman/Extender: Fredirick Maudlin ?[name] ?Weeks in Treatment: 2 ?Vital Signs ?Height(in): 62 ?Capillary Blood Glucose(mg/dl): 129 ?Weight(lbs): 114 ?Pulse(bpm): 68 ?Body Mass Index(BMI): 20.8 ?Blood Pressure(mmHg): 116/52 ?Temperature(??F): 98.4 ?Respiratory Rate(breaths/min): 18 ?Photos: [N/A:N/A] ?Right Gluteus N/A N/A ?Wound Location: ?Pressure Injury N/A N/A ?Wounding Event: ?Pressure Ulcer N/A N/A ?Primary  Etiology: ?Cataracts, Anemia, Coronary Artery N/A N/A ?Comorbid History: ?Disease, Deep Vein Thrombosis, ?Hypertension, Myocardial Infarction, ?Type II Diabetes, Seizure Disorder, ?Anorexia/bulimia ?02/05/2022 N/A N/A ?Date Acquired: ?2 N/A N/A ?Weeks of Treatment: ?Open N/A N/A ?Wound Status: ?No N/A N/A ?Wound Recurrence: ?1.4x1.3x0.1 N/A N/A ?Measurements L x W x D (cm) ?1.429 N/A N/A ?A (cm?) : ?rea ?0.143 N/A N/A ?Volume (cm?) : ?28.90% N/A N/A ?% Reduction in A rea: ?28.90% N/A N/A ?% Reduction in Volume: ?Category/Stage III N/A N/A ?Classification: ?Medium N/A N/A ?Exudate A mount: ?Serous N/A N/A ?Exudate Type: ?amber N/A N/A ?Exudate Color: ?Distinct, outline  attached N/A N/A ?Wound Margin: ?Medium (34-66%) N/A N/A ?Granulation Amount: ?Pink N/A N/A ?Granulation Quality: ?Medium (34-66%) N/A N/A ?Necrotic Amount: ?Fat Layer (Subcutaneous Tissue): Yes N/A N/A ?Exposed Structures: ?Fascia: No ?Tendon: No ?Muscle: No ?Joint: No ?Bone: No ?Small (1-33%) N/A N/A ?Epithelialization: ?Debridement - Excisional N/A N/A ?Debridement: ?Pre-procedure Verification/Time Out 11:55 N/A N/A ?Taken: ?Lidocaine 4% Topical Solution N/A N/A ?Pain Control: ?Subcutaneous, Slough N/A N/A ?Tissue Debrided: ?Skin/Subcutaneous Tissue N/A N/A ?Level: ?1.82 N/A N/A ?Debridement A (sq cm): ?rea ?Curette N/A N/A ?Instrument: ?Minimum N/A N/A ?Bleeding: ?Pressure N/A N/A ?Hemostasis A chieved: ?4 N/A N/A ?Procedural Pain: ?3 N/A N/A ?Post Procedural Pain: ?Procedure was tolerated well N/A N/A ?Debridement Treatment Response: ?1.4x1.3x0.1 N/A N/A ?Post Debridement Measurements L x ?W x D (cm) ?0.143 N/A N/A ?Post Debridement Volume: (cm?) ?Category/Stage III N/A N/A ?Post Debridement Stage: ?Debridement N/A N/A ?Procedures Performed: ?Treatment Notes ?Wound #1 (Gluteus) Wound Laterality: Right ?Cleanser ?Peri-Wound Care ?Skin Prep ?Discharge Instruction: Use skin prep as directed ?Topical ?Primary Dressing ?Santyl Ointment ?Discharge  Instruction: Apply nickel thick amount to wound bed as instructed ?Secondary Dressing ?Zetuvit Plus Silicone Border Dressing 4x4 (in/in) ?Discharge Instruction: Apply silicone border over primary dressing as directed. ?Secured With ?Compression Wrap ?Compression Stockings ?Add-Ons ?Electronic Signature(s) ?Signed: 03/19/2022 12:23:20 PM By: Fredirick Maudlin MD FACS ?Signed: 03/19/2022 6:28:42 PM By: Baruch Gouty RN, BSN ?Entered By: Fredirick Maudlin on 03/19/2022 12:23:20 ?-------------------------------------------------------------------------------- ?Multi-Disciplinary Care Plan Details ?Patient Name: ?Date of Service: ?[name] [name], [name] [name] 03/19/2022 11:15 A M ?Medical Record Number: [id number] ?Patient Account Number: [id number] ?Date of Birth/Sex: ?Treating RN: ?[birth year] (76 y.o. F) [name], [name] ?Primary Care Kenyia Wambolt: [name] ?Other Clinician: ?Referring Isla Sabree: ?Treating Nazarene Bunning/Extender: Fredirick Maudlin ?[name] ?Weeks in Treatment: 2 ?Multidisciplinary Care Plan reviewed with physician ?Active Inactive ?Abuse / Safety / Falls / Self Care Management ?Nursing Diagnoses: ?History of Falls ?Potential for falls ?Goals: ?Patient/caregiver will verbalize/demonstrate measures taken to prevent injury and/or falls ?Date Initiated: 03/05/2022 ?Target Resolution Date: 04/02/2022 ?Goal Status: Active ?Interventions: ?Assess fall risk on admission and as needed ?Assess impairment of mobility on admission and as needed per policy ?Notes: ?Nutrition ?Nursing Diagnoses: ?Imbalanced nutrition ?Impaired glucose control: actual or potential ?Potential for alteratiion in Nutrition/Potential for imbalanced nutrition ?Goals: ?Patient/caregiver will maintain therapeutic glucose control ?Date Initiated: 03/05/2022 ?Target Resolution Date: 04/02/2022 ?Goal Status: Active ?Interventions: ?Assess patient nutrition upon admission and as needed per policy ?Provide education on elevated blood sugars and impact on wound  healing ?Provide education on nutrition ?Treatment Activities: ?Dietary management education, guidance and counseling : 03/05/2022 ?Education provided on Nutrition : 03/05/2022 ?Giving encouragement to exercise : 03/05/2022 ?

## 2022-03-19 NOTE — Progress Notes (Signed)
[name] [name], [name] A. ([id number]) ?Visit Report for 03/19/2022 ?Chief Complaint Document Details ?Patient Name: Date of Service: ?[name] [name], [place] A. 03/19/2022 11:15 A M ?Medical Record Number: [id number] ?Patient Account Number: [id number] ?Date of Birth/Sex: Treating RN: ?11-Nov-1945 (76 y.o. F) [name], [name] ?Primary Care Provider: [name] Other Clinician: ?Referring Provider: ?Treating Provider/Extender: Fredirick Maudlin ?[name] ?Weeks in Treatment: 2 ?Information Obtained from: Patient ?Chief Complaint ?Patient is at the clinic for treatment of an open pressure ulcer ?Electronic Signature(s) ?Signed: 03/19/2022 12:23:26 PM By: Fredirick Maudlin MD FACS ?Entered By: Fredirick Maudlin on 03/19/2022 12:23:26 ?-------------------------------------------------------------------------------- ?Debridement Details ?Patient Name: Date of Service: ?[name] [name], [place] A. 03/19/2022 11:15 A M ?Medical Record Number: [id number] ?Patient Account Number: [id number] ?Date of Birth/Sex: Treating RN: ?[birth year] (76 y.o. F) [name], [name] ?Primary Care Provider: [name] Other Clinician: ?Referring Provider: ?Treating Provider/Extender: Fredirick Maudlin ?[name] ?Weeks in Treatment: 2 ?Debridement Performed for Assessment: Wound #1 Right Gluteus ?Performed By: Physician Fredirick Maudlin, MD ?Debridement Type: Debridement ?Level of Consciousness (Pre-procedure): Awake and Alert ?Pre-procedure Verification/Time Out Yes - 11:55 ?Taken: ?Start Time: 11:57 ?Pain Control: Lidocaine 4% T opical Solution ?T Area Debrided (L x W): ?otal 1.4 (cm) x 1.3 (cm) = 1.82 (cm?) ?Tissue and other material debrided: Viable, Non-Viable, Slough, Subcutaneous, Slough ?Level: Skin/Subcutaneous Tissue ?Debridement Description: Excisional ?Instrument: Curette ?Bleeding: Minimum ?Hemostasis Achieved: Pressure ?Procedural Pain: 4 ?Post Procedural Pain: 3 ?Response to Treatment: Procedure was tolerated well ?Level of Consciousness (Post- Awake and  Alert ?procedure): ?Post Debridement Measurements of Total Wound ?Length: (cm) 1.4 ?Stage: Category/Stage III ?Width: (cm) 1.3 ?Depth: (cm) 0.1 ?Volume: (cm?) 0.143 ?Character of Wound/Ulcer Post Debridement: Requires Further Debridement ?Post Procedure Diagnosis ?Same as Pre-procedure ?Electronic Signature(s) ?Signed: 03/19/2022 2:52:08 PM By: Fredirick Maudlin MD FACS ?Signed: 03/19/2022 6:28:42 PM By: Baruch Gouty RN, BSN ?Entered By: Baruch Gouty on 03/19/2022 11:59:22 ?-------------------------------------------------------------------------------- ?HPI Details ?Patient Name: Date of Service: ?[name] [name], [place] A. 03/19/2022 11:15 A M ?Medical Record Number: [id number] ?Patient Account Number: [id number] ?Date of Birth/Sex: Treating RN: ?15-Sep-1946 (76 y.o. F) [name], [name] ?Primary Care Provider: [name] Other Clinician: ?Referring Provider: ?Treating Provider/Extender: Fredirick Maudlin ?[name] ?Weeks in Treatment: 2 ?History of Present Illness ?HPI Description: ADMISSION ?03/05/2022 ?This is a 76 year old woman with a past medical history notable for type 2 diabetes mellitus, hypertension, coronary artery disease, and essential ?thrombocythemia. Her husband died fairly recently and in that time she has lost a substantial amount of weight. She is accompanied by one of her daughters ?who is a Marine scientist. Her other daughter is also a Marine scientist and lives next door. On February 05, 2022, the patient notified her daughters that she had a sore on her right ?buttock. Apparently 2 weeks prior to that, she fell in the yard and landed on her backside, but landed on grass and did not seem to sustain any obvious ?trauma. Since reporting the wound to her daughters, they have used some Santyl that they had leftover and have been using Medihoney since they ran out of ?that. She does not take insulin and her last hemoglobin A1c was reported to be 5.7. She is on hydroxyurea for essential thrombocythemia. On her right  gluteus, ?there is a circular wound with thick fibrinous slough. There is some visible pink tissue underneath the slough at the periphery of the wound. No odor or ?significant drainage. ?03/11/2022: The wound measured slightly larger today. It is a bit cleaner with less slough accumulation. There are buds of granulation tissue beginning to emerge. ?  No concern for infection. ?03/19/2022: The wound is a little bit smaller today. It still has about 50% surface covered with slough. There is granulation tissue continuing to emerge. ?Electronic Signature(s) ?Signed: 03/19/2022 12:23:57 PM By: Fredirick Maudlin MD FACS ?Entered By: Fredirick Maudlin on 03/19/2022 12:23:56 ?-------------------------------------------------------------------------------- ?Physical Exam Details ?Patient Name: Date of Service: ?[name] [name], [place] A. 03/19/2022 11:15 A M ?Medical Record Number: [id number] ?Patient Account Number: [id number] ?Date of Birth/Sex: Treating RN: ?April 05, 1946 (76 y.o. F) [name], [name] ?Primary Care Provider: [name] Other Clinician: ?Referring Provider: ?Treating Provider/Extender: Fredirick Maudlin ?[name] ?Weeks in Treatment: 2 ?Constitutional ?. . . . No acute distress. ?Respiratory ?Normal work of breathing on room air. ?Notes ?03/19/2022: The wound is a little bit smaller today. It still has about 50% surface covered with slough. There is granulation tissue continuing to emerge. ?Electronic Signature(s) ?Signed: 03/19/2022 12:24:24 PM By: Fredirick Maudlin MD FACS ?Signed: 03/19/2022 12:24:24 PM By: Fredirick Maudlin MD FACS ?Entered By: Fredirick Maudlin on 03/19/2022 12:24:24 ?-------------------------------------------------------------------------------- ?Physician Orders Details ?Patient Name: ?Date of Service: ?[name] [name], [name] [name] 03/19/2022 11:15 A M ?Medical Record Number: [id number] ?Patient Account Number: [id number] ?Date of Birth/Sex: ?Treating RN: ?05-08-1946 (76 y.o. F) [name], [name] ?Primary Care Provider:  [name] ?Other Clinician: ?Referring Provider: ?Treating Provider/Extender: Fredirick Maudlin ?[name] ?Weeks in Treatment: 2 ?Verbal / Phone Orders: No ?Diagnosis Coding ?ICD-10 Coding ?Code Description ?L89.313 Pressure ulcer of right buttock, stage 3 ?E11.622 Type 2 diabetes mellitus with other skin ulcer ?E43 Unspecified severe protein-calorie malnutrition ?D47.3 Essential (hemorrhagic) thrombocythemia ?Follow-up Appointments ?ppointment in 1 week. - Dr. Celine Ahr - Room 1 ?Return A ?Tuesday 5/23 @ 2:45 pm ?Bathing/ Shower/ Hygiene ?May shower and wash wound with soap and water. ?Off-Loading ?Other: - stand at least hourly during the day ?Additional Orders / Instructions ?Follow Nutritious Diet - add protein supplements to diet ?Wound Treatment ?Wound #1 - Gluteus Wound Laterality: Right ?Peri-Wound Care: Skin Prep 1 x Per Day/30 Days ?Discharge Instructions: Use skin prep as directed ?Prim Dressing: Santyl Ointment 1 x Per Day/30 Days ?ary ?Discharge Instructions: Apply nickel thick amount to wound bed as instructed ?Secondary Dressing: Zetuvit Plus Silicone Border Dressing 4x4 (in/in) 1 x Per Day/30 Days ?Discharge Instructions: Apply silicone border over primary dressing as directed. ?Electronic Signature(s) ?Signed: 03/19/2022 2:52:08 PM By: Fredirick Maudlin MD FACS ?Entered By: Fredirick Maudlin on 03/19/2022 12:24:35 ?-------------------------------------------------------------------------------- ?Problem List Details ?Patient Name: ?Date of Service: ?[name] [name], [name] [name] 03/19/2022 11:15 A M ?Medical Record Number: [id number] ?Patient Account Number: [id number] ?Date of Birth/Sex: ?Treating RN: ?[birth year] (76 y.o. F) [name], [name] ?Primary Care Provider: [name] ?Other Clinician: ?Referring Provider: ?Treating Provider/Extender: Fredirick Maudlin ?[name] ?Weeks in Treatment: 2 ?Active Problems ?ICD-10 ?Encounter ?Code Description Active Date MDM ?Diagnosis ?L89.313 Pressure ulcer of right  buttock, stage 3 03/05/2022 No Yes ?E11.622 Type 2 diabetes mellitus with other skin ulcer 03/05/2022 No Yes ?E43 Unspecified severe protein-calorie malnutrition 03/05/2022 No Yes ?D47.3 Essential (hemorrhagic) thrombocythemia 5/3/

## 2022-03-24 DIAGNOSIS — L89153 Pressure ulcer of sacral region, stage 3: Secondary | ICD-10-CM | POA: Diagnosis not present

## 2022-03-24 DIAGNOSIS — Z7901 Long term (current) use of anticoagulants: Secondary | ICD-10-CM | POA: Diagnosis not present

## 2022-03-24 DIAGNOSIS — Z86718 Personal history of other venous thrombosis and embolism: Secondary | ICD-10-CM | POA: Diagnosis not present

## 2022-03-25 ENCOUNTER — Encounter (HOSPITAL_BASED_OUTPATIENT_CLINIC_OR_DEPARTMENT_OTHER): Payer: Medicare Other | Admitting: General Surgery

## 2022-03-25 DIAGNOSIS — D473 Essential (hemorrhagic) thrombocythemia: Secondary | ICD-10-CM | POA: Diagnosis not present

## 2022-03-25 DIAGNOSIS — I10 Essential (primary) hypertension: Secondary | ICD-10-CM | POA: Diagnosis not present

## 2022-03-25 DIAGNOSIS — L89313 Pressure ulcer of right buttock, stage 3: Secondary | ICD-10-CM | POA: Diagnosis not present

## 2022-03-25 DIAGNOSIS — E43 Unspecified severe protein-calorie malnutrition: Secondary | ICD-10-CM | POA: Diagnosis not present

## 2022-03-25 DIAGNOSIS — I2510 Atherosclerotic heart disease of native coronary artery without angina pectoris: Secondary | ICD-10-CM | POA: Diagnosis not present

## 2022-03-25 DIAGNOSIS — E1169 Type 2 diabetes mellitus with other specified complication: Secondary | ICD-10-CM | POA: Diagnosis not present

## 2022-03-25 DIAGNOSIS — E11622 Type 2 diabetes mellitus with other skin ulcer: Secondary | ICD-10-CM | POA: Diagnosis not present

## 2022-03-28 DIAGNOSIS — R413 Other amnesia: Secondary | ICD-10-CM | POA: Diagnosis not present

## 2022-03-28 DIAGNOSIS — Z7901 Long term (current) use of anticoagulants: Secondary | ICD-10-CM | POA: Diagnosis not present

## 2022-03-28 DIAGNOSIS — L0100 Impetigo, unspecified: Secondary | ICD-10-CM | POA: Diagnosis not present

## 2022-04-01 NOTE — Progress Notes (Signed)
Gorder, Kiaraliz A. ([id number]) Visit Report for 03/25/2022 Chief Complaint Document Details Patient Name: Date of Service: [name] [name], [place] A. 03/25/2022 2:45 PM Medical Record Number: [id number] Patient Account Number: [id number] Date of Birth/Sex: Treating RN: 07-Jan-1946 (76 y.o. [name] Other Clinician: Referring Provider: Treating Provider/Extender: [name] in Treatment: 2 Information Obtained from: Patient Chief Complaint Patient is at the clinic for treatment of an open pressure ulcer Electronic Signature(s) Signed: 03/25/2022 4:50:55 PM By: Fredirick Maudlin MD FACS Entered By: Fredirick Maudlin on 03/25/2022 16:50:55 -------------------------------------------------------------------------------- Debridement Details Patient Name: Date of Service: [name] Locket, [name] 03/25/2022 2:45 PM Medical Record Number: [id number] Patient Account Number: [id number] Date of Birth/Sex: Treating RN: 1946/02/15 (76 y.o. [name] Primary Care Provider: [name] Other Clinician: Referring Provider: Treating Provider/Extender: [name] in Treatment: 2 Debridement Performed for Assessment: Wound #1 Right Gluteus Performed By: Physician Fredirick Maudlin, MD Debridement Type: Debridement Level of Consciousness (Pre-procedure): Awake and Alert Pre-procedure Verification/Time Out Yes - 15:58 Taken: Start Time: 15:58 Pain Control: Other : Benzocaine 20% T Area Debrided (L x W): otal 1.3 (cm) x 1.1 (cm) = 1.43 (cm) Tissue and other material debrided: Non-Viable, Slough, Slough Level: Non-Viable Tissue Debridement Description: Selective/Open Wound Instrument: Curette Bleeding: Minimum Procedural Pain: 0 Post Procedural Pain: 0 Response to Treatment: Procedure was tolerated well Level of Consciousness (Post- Awake and Alert procedure): Post Debridement Measurements of Total  Wound Length: (cm) 1.3 Stage: Category/Stage III Width: (cm) 1.1 Depth: (cm) 0.1 Volume: (cm) 0.112 Character of Wound/Ulcer Post Debridement: Improved Post Procedure Diagnosis Same as Pre-procedure Electronic Signature(s) Signed: 03/25/2022 5:26:11 PM By: Fredirick Maudlin MD FACS Signed: 04/01/2022 4:31:14 PM By: Sharyn Creamer RN, BSN Entered By: Sharyn Creamer on 03/25/2022 15:59:54 -------------------------------------------------------------------------------- HPI Details Patient Name: Date of Service: [name] Locket, [name] 03/25/2022 2:45 PM Medical Record Number: [id number] Patient Account Number: [id number] Date of Birth/Sex: Treating RN: Nov 24, 1945 (76 y.o. [name] Other Clinician: Referring Provider: Treating Provider/Extender: [name] in Treatment: 2 History of Present Illness HPI Description: ADMISSION 03/05/2022 This is a 76 year old woman with a past medical history notable for type 2 diabetes mellitus, hypertension, coronary artery disease, and essential thrombocythemia. Her husband died fairly recently and in that time she has lost a substantial amount of weight. She is accompanied by one of her daughters who is a Marine scientist. Her other daughter is also a Marine scientist and lives next door. On February 05, 2022, the patient notified her daughters that she had a sore on her right buttock. Apparently 2 weeks prior to that, she fell in the yard and landed on her backside, but landed on grass and did not seem to sustain any obvious trauma. Since reporting the wound to her daughters, they have used some Santyl that they had leftover and have been using Medihoney since they ran out of that. She does not take insulin and her last hemoglobin A1c was reported to be 5.7. She is on hydroxyurea for essential thrombocythemia. On her right gluteus, there is a circular wound with thick fibrinous slough. There is some visible pink  tissue underneath the slough at the periphery of the wound. No odor or significant drainage. 03/11/2022: The wound measured slightly larger today. It is a bit cleaner with less slough accumulation. There are buds of granulation tissue beginning to emerge. No concern for infection. 03/19/2022: The wound is a little  bit smaller today. It still has about 50% surface covered with slough. There is granulation tissue continuing to emerge. 03/25/2022: The wound is smaller today. There is granulation tissue emerging through a thin layer of slough. The patient states that she is "gagging down" the protein supplements. Electronic Signature(s) Signed: 03/25/2022 4:52:49 PM By: Fredirick Maudlin MD FACS Entered By: Fredirick Maudlin on 03/25/2022 16:52:48 -------------------------------------------------------------------------------- Physical Exam Details Patient Name: Date of Service: [name] Locket, [name] 03/25/2022 2:45 PM Medical Record Number: [id number] Patient Account Number: [id number] Date of Birth/Sex: Treating RN: 1946-06-13 (76 y.o. [name] Other Clinician: Referring Provider: Treating Provider/Extender: [name] in Treatment: 2 Constitutional . . . . No acute distress. Respiratory Normal work of breathing on room air. Notes 03/25/2022: The wound continues to contract. There is more granulation tissue emerging. There is still a thin layer of slough on the surface. Electronic Signature(s) Signed: 03/25/2022 4:53:32 PM By: Fredirick Maudlin MD FACS Entered By: Fredirick Maudlin on 03/25/2022 16:53:32 -------------------------------------------------------------------------------- Physician Orders Details Patient Name: Date of Service: [name] Locket, [name] 03/25/2022 2:45 PM Medical Record Number: [id number] Patient Account Number: [id number] Date of Birth/Sex: Treating RN: 01-18-1946 (76 y.o. [name] Primary Care  Provider: [name] Other Clinician: Referring Provider: Treating Provider/Extender: [name] in Treatment: 2 Verbal / Phone Orders: No Diagnosis Coding ICD-10 Coding Code Description L89.313 Pressure ulcer of right buttock, stage 3 E11.622 Type 2 diabetes mellitus with other skin ulcer E43 Unspecified severe protein-calorie malnutrition D47.3 Essential (hemorrhagic) thrombocythemia Follow-up Appointments ppointment in 1 week. - Dr. Celine Ahr - Room 1 Return A Tuesday 5/31 @ 9:45 am Bathing/ Shower/ Hygiene May shower and wash wound with soap and water. Off-Loading Other: - stand at least hourly during the day Additional Orders / Instructions Follow Nutritious Diet - add protein supplements to diet Wound Treatment Wound #1 - Gluteus Wound Laterality: Right Peri-Wound Care: Skin Prep 1 x Per Day/30 Days Discharge Instructions: Use skin prep as directed Prim Dressing: Santyl Ointment 1 x Per Day/30 Days ary Discharge Instructions: Apply nickel thick amount to wound bed as instructed Secondary Dressing: Zetuvit Plus Silicone Border Dressing 4x4 (in/in) 1 x Per Day/30 Days Discharge Instructions: Apply silicone border over primary dressing as directed. Electronic Signature(s) Signed: 03/25/2022 5:26:11 PM By: Fredirick Maudlin MD FACS Entered By: Fredirick Maudlin on 03/25/2022 16:53:46 -------------------------------------------------------------------------------- Problem List Details Patient Name: Date of Service: [name] Locket, [name] 03/25/2022 2:45 PM Medical Record Number: [id number] Patient Account Number: [id number] Date of Birth/Sex: Treating RN: 07/29/1946 (76 y.o. Nancy Fetter Primary Care Provider: [name] Other Clinician: Referring Provider: Treating Provider/Extender: [name] in Treatment: 2 Active Problems ICD-10 Encounter Code Description Active Date MDM Diagnosis L89.313 Pressure ulcer of right  buttock, stage 3 03/05/2022 No Yes E11.622 Type 2 diabetes mellitus with other skin ulcer 03/05/2022 No Yes E43 Unspecified severe protein-calorie malnutrition 03/05/2022 No Yes D47.3 Essential (hemorrhagic) thrombocythemia 03/05/2022 No Yes Inactive Problems Resolved Problems Electronic Signature(s) Signed: 03/25/2022 4:48:20 PM By: Fredirick Maudlin MD FACS Entered By: Fredirick Maudlin on 03/25/2022 16:48:19 -------------------------------------------------------------------------------- Progress Note Details Patient Name: Date of Service: [name] Locket, [name] 03/25/2022 2:45 PM Medical Record Number: [id number] Patient Account Number: [id number] Date of Birth/Sex: Treating RN: December 06, 1945 (76 y.o. [name] Other Clinician: Referring Provider: Treating Provider/Extender: [name] in Treatment: 2 Subjective Chief Complaint Information obtained from  Patient Patient is at the clinic for treatment of an open pressure ulcer History of Present Illness (HPI) ADMISSION 03/05/2022 This is a 76 year old woman with a past medical history notable for type 2 diabetes mellitus, hypertension, coronary artery disease, and essential thrombocythemia. Her husband died fairly recently and in that time she has lost a substantial amount of weight. She is accompanied by one of her daughters who is a Marine scientist. Her other daughter is also a Marine scientist and lives next door. On February 05, 2022, the patient notified her daughters that she had a sore on her right buttock. Apparently 2 weeks prior to that, she fell in the yard and landed on her backside, but landed on grass and did not seem to sustain any obvious trauma. Since reporting the wound to her daughters, they have used some Santyl that they had leftover and have been using Medihoney since they ran out of that. She does not take insulin and her last hemoglobin A1c was reported to be 5.7. She is on hydroxyurea  for essential thrombocythemia. On her right gluteus, there is a circular wound with thick fibrinous slough. There is some visible pink tissue underneath the slough at the periphery of the wound. No odor or significant drainage. 03/11/2022: The wound measured slightly larger today. It is a bit cleaner with less slough accumulation. There are buds of granulation tissue beginning to emerge. No concern for infection. 03/19/2022: The wound is a little bit smaller today. It still has about 50% surface covered with slough. There is granulation tissue continuing to emerge. 03/25/2022: The wound is smaller today. There is granulation tissue emerging through a thin layer of slough. The patient states that she is "gagging down" the protein supplements. Patient History Information obtained from Patient, Chart. Family History Heart Disease - Child,Father, Hypertension - Mother, No family history of Cancer, Diabetes, Hereditary Spherocytosis, Kidney Disease, Lung Disease, Seizures, Stroke, Thyroid Problems, Tuberculosis. Social History Never smoker, Marital Status - Widowed, Alcohol Use - Rarely, Drug Use - No History, Caffeine Use - Rarely. Medical History Eyes Patient has history of Cataracts - bil removed Denies history of Glaucoma, Optic Neuritis Hematologic/Lymphatic Patient has history of Anemia Denies history of Hemophilia, Human Immunodeficiency Virus, Lymphedema, Sickle Cell Disease Cardiovascular Patient has history of Coronary Artery Disease, Deep Vein Thrombosis - leg, Hypertension, Myocardial Infarction Endocrine Patient has history of Type II Diabetes Denies history of Type I Diabetes Genitourinary Denies history of End Stage Renal Disease Integumentary (Skin) Denies history of History of Burn Neurologic Patient has history of Seizure Disorder - focal sz Oncologic Denies history of Received Chemotherapy, Received Radiation Psychiatric Patient has history of Anorexia/bulimia - poor  appetite Denies history of Confinement Anxiety Hospitalization/Surgery History - ORIF right radius 2019. - left hand IandD. - coronary angioplasty with stent 2004. - c-section. - bil cataract ectraction. Medical A Surgical History Notes nd Cardiovascular hyperlipidemia Objective Constitutional No acute distress. Vitals Time Taken: 3:09 PM, Height: 62 in, Weight: 114 lbs, BMI: 20.8, Temperature: 98.5 F, Pulse: 66 bpm, Respiratory Rate: 18 breaths/min, Blood Pressure: 117/68 mmHg, Capillary Blood Glucose: 124 mg/dl. Respiratory Normal work of breathing on room air. General Notes: 03/25/2022: The wound continues to contract. There is more granulation tissue emerging. There is still a thin layer of slough on the surface. Integumentary (Hair, Skin) Wound #1 status is Open. Original cause of wound was Pressure Injury. The date acquired was: 02/05/2022. The wound has been in treatment 2 weeks. The wound is located on the Right Gluteus. The  wound measures 1.3cm length x 1.1cm width x 0.1cm depth; 1.123cm^2 area and 0.112cm^3 volume. There is Fat Layer (Subcutaneous Tissue) exposed. There is no tunneling or undermining noted. There is a medium amount of serous drainage noted. The wound margin is distinct with the outline attached to the wound base. There is medium (34-66%) pink granulation within the wound bed. There is a medium (34-66%) amount of necrotic tissue within the wound bed including Adherent Slough. Assessment Active Problems ICD-10 Pressure ulcer of right buttock, stage 3 Type 2 diabetes mellitus with other skin ulcer Unspecified severe protein-calorie malnutrition Essential (hemorrhagic) thrombocythemia Procedures Wound #1 Pre-procedure diagnosis of Wound #1 is a Pressure Ulcer located on the Right Gluteus . There was a Selective/Open Wound Non-Viable Tissue Debridement with a total area of 1.43 sq cm performed by Fredirick Maudlin, MD. With the following instrument(s): Curette to  remove Non-Viable tissue/material. Material removed includes [facility] after achieving pain control using Other (Benzocaine 20%). No specimens were taken. A time out was conducted at 15:58, prior to the start of the procedure. A Minimum amount of bleeding was controlled with N/A. The procedure was tolerated well with a pain level of 0 throughout and a pain level of 0 following the procedure. Post Debridement Measurements: 1.3cm length x 1.1cm width x 0.1cm depth; 0.112cm^3 volume. Post debridement Stage noted as Category/Stage III. Character of Wound/Ulcer Post Debridement is improved. Post procedure Diagnosis Wound #1: Same as Pre-Procedure Plan Follow-up Appointments: Return Appointment in 1 week. - Dr. Celine Ahr - Room 1 Tuesday 5/31 @ 9:45 am Bathing/ Shower/ Hygiene: May shower and wash wound with soap and water. Off-Loading: Other: - stand at least hourly during the day Additional Orders / Instructions: Follow Nutritious Diet - add protein supplements to diet WOUND #1: - Gluteus Wound Laterality: Right Peri-Wound Care: Skin Prep 1 x Per Day/30 Days Discharge Instructions: Use skin prep as directed Prim Dressing: Santyl Ointment 1 x Per Day/30 Days ary Discharge Instructions: Apply nickel thick amount to wound bed as instructed Secondary Dressing: Zetuvit Plus Silicone Border Dressing 4x4 (in/in) 1 x Per Day/30 Days Discharge Instructions: Apply silicone border over primary dressing as directed. 03/25/2022: The wound continues to contract. There is more granulation tissue emerging. There is still a thin layer of slough on the surface. I used a curette to debride the slough from the wound. We will continue using Santyl. She was commended for her efforts and increasing her protein intake, despite her distaste for the supplements. I will see her back in 1 week. Electronic Signature(s) Signed: 03/25/2022 4:54:22 PM By: Fredirick Maudlin MD FACS Entered By: Fredirick Maudlin on 03/25/2022  16:54:22 -------------------------------------------------------------------------------- HxROS Details Patient Name: Date of Service: [name] Locket, [name] 03/25/2022 2:45 PM Medical Record Number: [id number] Patient Account Number: [id number] Date of Birth/Sex: Treating RN: 1946/06/28 (76 y.o. [name] Other Clinician: Referring Provider: Treating Provider/Extender: [name] in Treatment: 2 Information Obtained From Patient Chart Eyes Medical History: Positive for: Cataracts - bil removed Negative for: Glaucoma; Optic Neuritis Hematologic/Lymphatic Medical History: Positive for: Anemia Negative for: Hemophilia; Human Immunodeficiency Virus; Lymphedema; Sickle Cell Disease Cardiovascular Medical History: Positive for: Coronary Artery Disease; Deep Vein Thrombosis - leg; Hypertension; Myocardial Infarction Past Medical History Notes: hyperlipidemia Endocrine Medical History: Positive for: Type II Diabetes Negative for: Type I Diabetes Time with diabetes: age 1 Treated with: Oral agents Blood sugar tested every day: Yes Tested : once Genitourinary Medical History: Negative for: End Stage  Renal Disease Integumentary (Skin) Medical History: Negative for: History of Burn Neurologic Medical History: Positive for: Seizure Disorder - focal sz Oncologic Medical History: Negative for: Received Chemotherapy; Received Radiation Psychiatric Medical History: Positive for: Anorexia/bulimia - poor appetite Negative for: Confinement Anxiety HBO Extended History Items Eyes: Cataracts Immunizations Pneumococcal Vaccine: Received Pneumococcal Vaccination: Yes Received Pneumococcal Vaccination On or After 60th Birthday: Yes Implantable Devices No devices added Hospitalization / Surgery History Type of Hospitalization/Surgery ORIF right radius 2019 left hand IandD coronary angioplasty with stent  2004 c-section bil cataract ectraction Family and Social History Cancer: No; Diabetes: No; Heart Disease: Yes - Child,Father; Hereditary Spherocytosis: No; Hypertension: Yes - Mother; Kidney Disease: No; Lung Disease: No; Seizures: No; Stroke: No; Thyroid Problems: No; Tuberculosis: No; Never smoker; Marital Status - Widowed; Alcohol Use: Rarely; Drug Use: No History; Caffeine Use: Rarely; Financial Concerns: No; Food, Clothing or Shelter Needs: No; Support System Lacking: No; Transportation Concerns: No Engineer, maintenance) Signed: 03/25/2022 5:26:11 PM By: Fredirick Maudlin MD FACS Signed: 03/27/2022 6:06:15 PM By: Baruch Gouty RN, BSN Entered By: Fredirick Maudlin on 03/25/2022 16:52:53 -------------------------------------------------------------------------------- [place] Details Patient Name: Date of Service: [name] Locket, [name] 03/25/2022 Medical Record Number: [id number] Patient Account Number: [id number] Date of Birth/Sex: Treating RN: 05-Jul-1946 (76 y.o. [name] Other Clinician: Referring Provider: Treating Provider/Extender: [name] in Treatment: 2 Diagnosis Coding ICD-10 Codes Code Description [phone] Pressure ulcer of right buttock, stage 3 E11.622 Type 2 diabetes mellitus with other skin ulcer E43 Unspecified severe protein-calorie malnutrition D47.3 Essential (hemorrhagic) thrombocythemia Facility Procedures CPT4 Code: [id number] Description: 7597 - DEBRIDE WOUND 1ST 20 SQ CM OR < ICD-10 Diagnosis Description L89.313 Pressure ulcer of right buttock, stage 3 Modifier: Quantity: 1 Physician Procedures : CPT4 Code Description Modifier [id number] - WC PHYS LEVEL 3 - EST PT 25 ICD-10 Diagnosis Description L89.313 Pressure ulcer of right buttock, stage 3 E11.622 Type 2 diabetes mellitus with other skin ulcer Quantity: 1 : [id number] - WC PHYS DEBR WO ANESTH 20 SQ CM ICD-10 Diagnosis  Description L89.313 Pressure ulcer of right buttock, stage 3 Quantity: 1 Electronic Signature(s) Signed: 03/25/2022 4:55:40 PM By: Fredirick Maudlin MD FACS Entered By: Fredirick Maudlin on 03/25/2022 16:55:40

## 2022-04-01 NOTE — Progress Notes (Signed)
Tolles, Sherrin A. ([id number]) Visit Report for 03/25/2022 Arrival Information Details Patient Name: Date of Service: [name] [name], [name] A. 03/25/2022 2:45 PM Medical Record Number: [id number] Patient Account Number: [id number] Date of Birth/Sex: Treating RN: [birth year] (76 y.o. [name] [name] Primary Care Kema Santaella: [name] Other Clinician: Referring [name] [name]: Treating [name] [name]/Extender: [name] in Treatment: 2 Visit Information History Since Last Visit Added or deleted any medications: No Patient Arrived: Ambulatory Any new allergies or adverse reactions: No Arrival Time: 15:08 Had a fall or experienced change in No Accompanied By: daughter activities of daily living that may affect Transfer Assistance: None risk of falls: Patient Identification Verified: Yes Signs or symptoms of abuse/neglect since last visito No Secondary Verification Process Completed: Yes Hospitalized since last visit: No Patient Requires Transmission-Based Precautions: No Implantable device outside of the clinic excluding No Patient Has Alerts: Yes cellular tissue based products placed in the center Patient Alerts: Patient on Blood Thinner since last visit: Has Dressing in Place as Prescribed: Yes Pain Present Now: Yes Electronic Signature(s) Signed: 03/26/2022 3:12:02 PM By: Sandre Kitty Entered By: Sandre Kitty on 03/25/2022 15:09:02 -------------------------------------------------------------------------------- Encounter Discharge Information Details Patient Name: Date of Service: [name] Locket, PA [name] A. 03/25/2022 2:45 PM Medical Record Number: [id number] Patient Account Number: [id number] Date of Birth/Sex: Treating RN: [birth year] (76 y.o. [name] [name] Primary Care Frayda Egley: [name] Other Clinician: Referring [name] [name]: Treating [name] [name]/Extender: [name] in Treatment: 2 Encounter Discharge Information Items Post Procedure  Vitals Discharge Condition: Stable Temperature (F): 98.5 Ambulatory Status: Ambulatory Pulse (bpm): 66 Discharge Destination: Home Respiratory Rate (breaths/min): 18 Transportation: Private Auto Blood Pressure (mmHg): 117/68 Accompanied By: daughter Schedule Follow-up Appointment: Yes Clinical Summary of Care: Patient Declined Electronic Signature(s) Signed: 04/01/2022 4:31:14 PM By: Sharyn Creamer RN, BSN Entered By: Sharyn Creamer on 03/25/2022 17:27:54 -------------------------------------------------------------------------------- Lower Extremity Assessment Details Patient Name: Date of Service: [name] [name], [name] [name] A. 03/25/2022 2:45 PM Medical Record Number: [id number] Patient Account Number: [id number] Date of Birth/Sex: Treating RN: 10-22-1946 (76 y.o. [name] [name] Primary Care Koby Pickup: [name] Other Clinician: Referring Andelyn Spade: Treating [name] [name]/Extender: [name] in Treatment: 2 Electronic Signature(s) Signed: 04/01/2022 4:31:14 PM By: Sharyn Creamer RN, BSN Entered By: Sharyn Creamer on 03/25/2022 15:31:52 -------------------------------------------------------------------------------- Multi Wound Chart Details Patient Name: Date of Service: [name] Locket, PA [name] A. 03/25/2022 2:45 PM Medical Record Number: [id number] Patient Account Number: [id number] Date of Birth/Sex: Treating RN: 04/25/1946 (76 y.o. [name] [name] Primary Care Marquee Fuchs: [name] Other Clinician: Referring [employer]: Treating [name] [name]/Extender: [name] in Treatment: 2 Vital Signs Height(in): 79 Capillary Blood Glucose(mg/dl): 124 Weight(lbs): 114 Pulse(bpm): 2 Body Mass Index(BMI): 20.8 Blood Pressure(mmHg): 117/68 Temperature(F): 98.5 Respiratory Rate(breaths/min): 18 Photos: [N/A:N/A] Right Gluteus N/A N/A Wound Location: Pressure Injury N/A N/A Wounding Event: Pressure Ulcer N/A N/A Primary Etiology: Cataracts,  Anemia, Coronary Artery N/A N/A Comorbid History: Disease, Deep Vein Thrombosis, Hypertension, Myocardial Infarction, Type II Diabetes, Seizure Disorder, Anorexia/bulimia 02/05/2022 N/A N/A Date Acquired: 2 N/A N/A Weeks of Treatment: Open N/A N/A Wound Status: No N/A N/A Wound Recurrence: 1.3x1.1x0.1 N/A N/A Measurements L x W x D (cm) 1.123 N/A N/A A (cm) : rea 0.112 N/A N/A Volume (cm) : 44.20% N/A N/A % Reduction in A rea: 44.30% N/A N/A % Reduction in Volume: Category/Stage III N/A N/A Classification: Medium N/A N/A Exudate A mount: Serous N/A N/A Exudate Type: amber N/A N/A Exudate Color: Distinct, outline attached N/A N/A Wound Margin: Medium (  34-66%) N/A N/A Granulation Amount: Pink N/A N/A Granulation Quality: Medium (34-66%) N/A N/A Necrotic Amount: Fat Layer (Subcutaneous Tissue): Yes N/A N/A Exposed Structures: Fascia: No Tendon: No Muscle: No Joint: No Bone: No Small (1-33%) N/A N/A Epithelialization: Debridement - Selective/Open Wound N/A N/A Debridement: Pre-procedure Verification/Time Out 15:58 N/A N/A Taken: Other N/A N/A Pain Control: Slough N/A N/A Tissue Debrided: Non-Viable Tissue N/A N/A Level: 1.43 N/A N/A Debridement A (sq cm): rea Curette N/A N/A Instrument: Minimum N/A N/A Bleeding: 0 N/A N/A Procedural Pain: 0 N/A N/A Post Procedural Pain: Procedure was tolerated well N/A N/A Debridement Treatment Response: 1.3x1.1x0.1 N/A N/A Post Debridement Measurements L x W x D (cm) 0.112 N/A N/A Post Debridement Volume: (cm) Category/Stage III N/A N/A Post Debridement Stage: Debridement N/A N/A Procedures Performed: Treatment Notes Electronic Signature(s) Signed: 03/25/2022 4:48:25 PM By: Fredirick Maudlin MD FACS Signed: 03/27/2022 6:06:15 PM By: Baruch Gouty RN, BSN Entered By: Fredirick Maudlin on 03/25/2022  16:48:25 -------------------------------------------------------------------------------- Multi-Disciplinary Care Plan Details Patient Name: Date of Service: [name] [name], [name] [name] A. 03/25/2022 2:45 PM Medical Record Number: [id number] Patient Account Number: [id number] Date of Birth/Sex: Treating RN: [birth year] (75 y.o. [name] [name] Primary Care Belicia Difatta: [name] Other Clinician: Referring Iris Hairston: Treating Cayden Rautio/Extender: [name] in Treatment: 2 Multidisciplinary Care Plan reviewed with physician Active Inactive Abuse / Safety / Falls / Self Care Management Nursing Diagnoses: History of Falls Potential for falls Goals: Patient/caregiver will verbalize/demonstrate measures taken to prevent injury and/or falls Date Initiated: 03/05/2022 Target Resolution Date: 04/02/2022 Goal Status: Active Interventions: Assess fall risk on admission and as needed Assess impairment of mobility on admission and as needed per policy Notes: Nutrition Nursing Diagnoses: Imbalanced nutrition Impaired glucose control: actual or potential Potential for alteratiion in Nutrition/Potential for imbalanced nutrition Goals: Patient/caregiver will maintain therapeutic glucose control Date Initiated: 03/05/2022 Target Resolution Date: 04/02/2022 Goal Status: Active Interventions: Assess patient nutrition upon admission and as needed per policy Provide education on elevated blood sugars and impact on wound healing Provide education on nutrition Treatment Activities: Dietary management education, guidance and counseling : 03/05/2022 Education provided on Nutrition : 03/05/2022 Giving encouragement to exercise : 03/05/2022 Patient referred to Primary Care Physician for further nutritional evaluation : 03/05/2022 Notes: Wound/Skin Impairment Nursing Diagnoses: Impaired tissue integrity Knowledge deficit related to ulceration/compromised skin integrity Goals: Patient/caregiver  will verbalize understanding of skin care regimen Date Initiated: 03/05/2022 Target Resolution Date: 04/02/2022 Goal Status: Active Ulcer/skin breakdown will have a volume reduction of 30% by week 4 Date Initiated: 03/05/2022 Target Resolution Date: 04/02/2022 Goal Status: Active Interventions: Assess patient/caregiver ability to obtain necessary supplies Assess patient/caregiver ability to perform ulcer/skin care regimen upon admission and as needed Assess ulceration(s) every visit Provide education on ulcer and skin care Treatment Activities: Skin care regimen initiated : 03/05/2022 Topical wound management initiated : 03/05/2022 Notes: Electronic Signature(s) Signed: 04/01/2022 4:31:14 PM By: Sharyn Creamer RN, BSN Entered By: Sharyn Creamer on 03/25/2022 15:32:10 -------------------------------------------------------------------------------- Pain Assessment Details Patient Name: Date of Service: [name] Locket, PA [name] A. 03/25/2022 2:45 PM Medical Record Number: [id number] Patient Account Number: [id number] Date of Birth/Sex: Treating RN: 1946/05/21 (76 y.o. [name] [name] Primary Care Demiyah Fischbach: [name] Other Clinician: Referring Marquita Lias: Treating Jivan Symanski/Extender: [name] in Treatment: 2 Active Problems Location of Pain Severity and Description of Pain Patient Has Paino Yes Site Locations Rate the pain. Rate the pain. Current Pain Level: 4 Pain Management and Medication Current Pain Management: Electronic Signature(s) Signed: 03/26/2022 3:12:02  PM By: Sandre Kitty Signed: 03/27/2022 6:06:15 PM By: Baruch Gouty RN, BSN Entered By: Sandre Kitty on 03/25/2022 15:09:50 -------------------------------------------------------------------------------- Patient/Caregiver Education Details Patient Name: Date of Service: [name] [name], [place] 5/23/2023andnbsp2:45 PM Medical Record Number: [id number] Patient Account Number: [id number] Date of  Birth/Gender: Treating RN: [birth year] (76 y.o. [name] [name] Primary Care Physician: [name] Other Clinician: Referring Physician: Treating Physician/Extender: [name] in Treatment: 2 Education Assessment Education Provided To: Patient Education Topics Provided Wound/Skin Impairment: Methods: Explain/Verbal Responses: State content correctly Electronic Signature(s) Signed: 04/01/2022 4:31:14 PM By: Sharyn Creamer RN, BSN Entered By: Sharyn Creamer on 03/25/2022 15:32:41 -------------------------------------------------------------------------------- Wound Assessment Details Patient Name: Date of Service: [name] Locket, PA [name] A. 03/25/2022 2:45 PM Medical Record Number: [id number] Patient Account Number: [id number] Date of Birth/Sex: Treating RN: [birth year] (76 y.o. [name] [name] Primary Care Naphtali Riede: [name] Other Clinician: Referring Taneisha Fuson: Treating Berneice Zettlemoyer/Extender: [name] in Treatment: 2 Wound Status Wound Number: 1 Primary Pressure Ulcer Etiology: Wound Location: Right Gluteus Wound Open Wounding Event: Pressure Injury Status: Date Acquired: 02/05/2022 Comorbid Cataracts, Anemia, Coronary Artery Disease, Deep Vein Weeks Of Treatment: 2 History: Thrombosis, Hypertension, Myocardial Infarction, Type II Diabetes, Clustered Wound: No Seizure Disorder, Anorexia/bulimia Photos Wound Measurements Length: (cm) 1.3 Width: (cm) 1.1 Depth: (cm) 0.1 Area: (cm) 1.123 Volume: (cm) 0.112 % Reduction in Area: 44.2% % Reduction in Volume: 44.3% Epithelialization: Small (1-33%) Tunneling: No Undermining: No Wound Description Classification: Category/Stage III Wound Margin: Distinct, outline attached Exudate Amount: Medium Exudate Type: Serous Exudate Color: amber Foul Odor After Cleansing: No Slough/Fibrino Yes Wound Bed Granulation Amount: Medium (34-66%) Exposed Structure Granulation Quality:  Pink Fascia Exposed: No Necrotic Amount: Medium (34-66%) Fat Layer (Subcutaneous Tissue) Exposed: Yes Necrotic Quality: Adherent Slough Tendon Exposed: No Muscle Exposed: No Joint Exposed: No Bone Exposed: No Treatment Notes Wound #1 (Gluteus) Wound Laterality: Right Cleanser Peri-Wound Care Skin Prep Discharge Instruction: Use skin prep as directed Topical Primary Dressing Santyl Ointment Discharge Instruction: Apply nickel thick amount to wound bed as instructed Secondary Dressing Zetuvit Plus Silicone Border Dressing 4x4 (in/in) Discharge Instruction: Apply silicone border over primary dressing as directed. Secured With Compression Wrap Compression Stockings Environmental education officer) Signed: 03/26/2022 5:57:27 PM By: Levan Hurst RN, BSN Signed: 03/27/2022 6:06:15 PM By: Baruch Gouty RN, BSN Entered By: Levan Hurst on 03/25/2022 15:28:08 -------------------------------------------------------------------------------- [facility] Details Patient Name: Date of Service: [name] Locket, [place] [name] A. 03/25/2022 2:45 PM Medical Record Number: [id number] Patient Account Number: [id number] Date of Birth/Sex: Treating RN: [birth year] (76 y.o. [name] [name] Primary Care Aleira Deiter: [name] Other Clinician: Referring Joas Motton: Treating Della Homan/Extender: [name] in Treatment: 2 Vital Signs Time Taken: 15:09 Temperature (F): 98.5 Height (in): 62 Pulse (bpm): 66 Weight (lbs): 114 Respiratory Rate (breaths/min): 18 Body Mass Index (BMI): 20.8 Blood Pressure (mmHg): 117/68 Capillary Blood Glucose (mg/dl): 124 Reference Range: 80 - 120 mg / dl Electronic Signature(s) Signed: 03/26/2022 3:12:02 PM By: Sandre Kitty Entered By: Sandre Kitty on 03/25/2022 15:09:39

## 2022-04-02 ENCOUNTER — Encounter (HOSPITAL_BASED_OUTPATIENT_CLINIC_OR_DEPARTMENT_OTHER): Payer: Medicare Other | Admitting: General Surgery

## 2022-04-02 DIAGNOSIS — E11622 Type 2 diabetes mellitus with other skin ulcer: Secondary | ICD-10-CM | POA: Diagnosis not present

## 2022-04-02 DIAGNOSIS — D473 Essential (hemorrhagic) thrombocythemia: Secondary | ICD-10-CM | POA: Diagnosis not present

## 2022-04-02 DIAGNOSIS — E43 Unspecified severe protein-calorie malnutrition: Secondary | ICD-10-CM | POA: Diagnosis not present

## 2022-04-02 DIAGNOSIS — I2510 Atherosclerotic heart disease of native coronary artery without angina pectoris: Secondary | ICD-10-CM | POA: Diagnosis not present

## 2022-04-02 DIAGNOSIS — L89313 Pressure ulcer of right buttock, stage 3: Secondary | ICD-10-CM | POA: Diagnosis not present

## 2022-04-02 DIAGNOSIS — I10 Essential (primary) hypertension: Secondary | ICD-10-CM | POA: Diagnosis not present

## 2022-04-02 DIAGNOSIS — L98412 Non-pressure chronic ulcer of buttock with fat layer exposed: Secondary | ICD-10-CM | POA: Diagnosis not present

## 2022-04-07 NOTE — Progress Notes (Signed)
[name], Adalai A. ([id number]) Visit Report for 04/02/2022 Chief Complaint Document Details Patient Name: Date of Service: [name] [name], [name] A. 04/02/2022 9:45 A M Medical Record Number: [id number] Patient Account Number: [id number] Date of Birth/Sex: Treating RN: Jul 30, 1946 (76 y.o. [name] Other Clinician: Referring Provider: Treating Provider/Extender: [name] in Treatment: 4 Information Obtained from: Patient Chief Complaint Patient is at the clinic for treatment of an open pressure ulcer Electronic Signature(s) Signed: 04/02/2022 10:24:47 AM By: Fredirick Maudlin MD FACS Entered By: Fredirick Maudlin on 04/02/2022 10:24:47 -------------------------------------------------------------------------------- HPI Details Patient Name: Date of Service: [name] [name], [name] [name] 04/02/2022 9:45 A M Medical Record Number: [id number] Patient Account Number: [id number] Date of Birth/Sex: Treating RN: July 05, 1946 (76 y.o. [name] Other Clinician: Referring Provider: Treating Provider/Extender: [name] in Treatment: 4 History of Present Illness HPI Description: ADMISSION 03/05/2022 This is a 76 year old woman with a past medical history notable for type 2 diabetes mellitus, hypertension, coronary artery disease, and essential thrombocythemia. Her husband died fairly recently and in that time she has lost a substantial amount of weight. She is accompanied by one of her daughters who is a Marine scientist. Her other daughter is also a Marine scientist and lives next door. On February 05, 2022, the patient notified her daughters that she had a sore on her right buttock. Apparently 2 weeks prior to that, she fell in the yard and landed on her backside, but landed on grass and did not seem to sustain any obvious trauma. Since reporting the wound to her daughters, they have used some Santyl  that they had leftover and have been using Medihoney since they ran out of that. She does not take insulin and her last hemoglobin A1c was reported to be 5.7. She is on hydroxyurea for essential thrombocythemia. On her right gluteus, there is a circular wound with thick fibrinous slough. There is some visible pink tissue underneath the slough at the periphery of the wound. No odor or significant drainage. 03/11/2022: The wound measured slightly larger today. It is a bit cleaner with less slough accumulation. There are buds of granulation tissue beginning to emerge. No concern for infection. 03/19/2022: The wound is a little bit smaller today. It still has about 50% surface covered with slough. There is granulation tissue continuing to emerge. 03/25/2022: The wound is smaller today. There is granulation tissue emerging through a thin layer of slough. The patient states that she is "gagging down" the protein supplements. 04/02/2022: Her wound is smaller, more superficial, and cleaner today. Her daughter has been monitoring her protein intake and the patient has been managing to get several doses of Pro-Stat daily. Electronic Signature(s) Signed: 04/02/2022 10:25:20 AM By: Fredirick Maudlin MD FACS Entered By: Fredirick Maudlin on 04/02/2022 10:25:20 -------------------------------------------------------------------------------- Physical Exam Details Patient Name: Date of Service: [name] [name], [name] [name] 04/02/2022 9:45 A M Medical Record Number: [id number] Patient Account Number: [id number] Date of Birth/Sex: Treating RN: 01/30/1946 (76 y.o. [name] Other Clinician: Referring Provider: Treating Provider/Extender: [name] in Treatment: 4 Constitutional . . . . No acute distress. Respiratory Normal work of breathing on room air. Notes 04/02/2022: The wound is smaller, more superficial, and without any significant slough  accumulation. Electronic Signature(s) Signed: 04/02/2022 10:29:49 AM By: Fredirick Maudlin MD FACS Entered By: Fredirick Maudlin on 04/02/2022 10:29:49 -------------------------------------------------------------------------------- Physician Orders Details Patient Name: Date of  Service: [name] [name], [name] [name] 04/02/2022 9:45 A M Medical Record Number: [id number] Patient Account Number: [id number] Date of Birth/Sex: Treating RN: 1946/06/29 (76 y.o. [name] Primary Care Provider: [name] Other Clinician: Referring Provider: Treating Provider/Extender: [name] in Treatment: 4 Verbal / Phone Orders: No Diagnosis Coding ICD-10 Coding Code Description L89.313 Pressure ulcer of right buttock, stage 3 E11.622 Type 2 diabetes mellitus with other skin ulcer E43 Unspecified severe protein-calorie malnutrition D47.3 Essential (hemorrhagic) thrombocythemia Follow-up Appointments ppointment in 1 week. - Dr. Celine Ahr - Room 1 Return A Friday 6/9 @ 10:30am Bathing/ Shower/ Hygiene May shower and wash wound with soap and water. Off-Loading Other: - stand at least hourly during the day Additional Orders / Instructions Follow Nutritious Diet - add protein supplements to diet Wound Treatment Wound #1 - Gluteus Wound Laterality: Right Peri-Wound Care: Skin Prep 1 x Per Day/30 Days Discharge Instructions: Use skin prep as directed Prim Dressing: Santyl Ointment 1 x Per Day/30 Days ary Discharge Instructions: Apply nickel thick amount to wound bed as instructed Secondary Dressing: Zetuvit Plus Silicone Border Dressing 4x4 (in/in) 1 x Per Day/30 Days Discharge Instructions: Apply silicone border over primary dressing as directed. Electronic Signature(s) Signed: 04/02/2022 12:04:28 PM By: Fredirick Maudlin MD FACS Entered By: Fredirick Maudlin on 04/02/2022 10:30:00 -------------------------------------------------------------------------------- Problem List  Details Patient Name: Date of Service: [name] [name], [name] [name] 04/02/2022 9:45 A M Medical Record Number: [id number] Patient Account Number: [id number] Date of Birth/Sex: Treating RN: 10/22/1946 (76 y.o. [name] Other Clinician: Referring Provider: Treating Provider/Extender: [name] in Treatment: 4 Active Problems ICD-10 Encounter Code Description Active Date MDM Diagnosis L89.313 Pressure ulcer of right buttock, stage 3 03/05/2022 No Yes E11.622 Type 2 diabetes mellitus with other skin ulcer 03/05/2022 No Yes E43 Unspecified severe protein-calorie malnutrition 03/05/2022 No Yes D47.3 Essential (hemorrhagic) thrombocythemia 03/05/2022 No Yes Inactive Problems Resolved Problems Electronic Signature(s) Signed: 04/02/2022 10:24:37 AM By: Fredirick Maudlin MD FACS Entered By: Fredirick Maudlin on 04/02/2022 10:24:37 -------------------------------------------------------------------------------- Progress Note Details Patient Name: Date of Service: [name] [name], [name] [name] 04/02/2022 9:45 A M Medical Record Number: [id number] Patient Account Number: [id number] Date of Birth/Sex: Treating RN: February 05, 1946 (76 y.o. [name] Other Clinician: Referring Provider: Treating Provider/Extender: [name] in Treatment: 4 Subjective Chief Complaint Information obtained from Patient Patient is at the clinic for treatment of an open pressure ulcer History of Present Illness (HPI) ADMISSION 03/05/2022 This is a 76 year old woman with a past medical history notable for type 2 diabetes mellitus, hypertension, coronary artery disease, and essential thrombocythemia. Her husband died fairly recently and in that time she has lost a substantial amount of weight. She is accompanied by one of her daughters who is a Marine scientist. Her other daughter is also a Marine scientist and lives next door.  On February 05, 2022, the patient notified her daughters that she had a sore on her right buttock. Apparently 2 weeks prior to that, she fell in the yard and landed on her backside, but landed on grass and did not seem to sustain any obvious trauma. Since reporting the wound to her daughters, they have used some Santyl that they had leftover and have been using Medihoney since they ran out of that. She does not take insulin and her last hemoglobin A1c was reported to be 5.7. She is on hydroxyurea for essential thrombocythemia. On her right gluteus, there  is a circular wound with thick fibrinous slough. There is some visible pink tissue underneath the slough at the periphery of the wound. No odor or significant drainage. 03/11/2022: The wound measured slightly larger today. It is a bit cleaner with less slough accumulation. There are buds of granulation tissue beginning to emerge. No concern for infection. 03/19/2022: The wound is a little bit smaller today. It still has about 50% surface covered with slough. There is granulation tissue continuing to emerge. 03/25/2022: The wound is smaller today. There is granulation tissue emerging through a thin layer of slough. The patient states that she is "gagging down" the protein supplements. 04/02/2022: Her wound is smaller, more superficial, and cleaner today. Her daughter has been monitoring her protein intake and the patient has been managing to get several doses of Pro-Stat daily. Patient History Information obtained from Patient, Chart. Family History Heart Disease - Child,Father, Hypertension - Mother, No family history of Cancer, Diabetes, Hereditary Spherocytosis, Kidney Disease, Lung Disease, Seizures, Stroke, Thyroid Problems, Tuberculosis. Social History Never smoker, Marital Status - Widowed, Alcohol Use - Rarely, Drug Use - No History, Caffeine Use - Rarely. Medical History Eyes Patient has history of Cataracts - bil removed Denies history of  Glaucoma, Optic Neuritis Hematologic/Lymphatic Patient has history of Anemia Denies history of Hemophilia, Human Immunodeficiency Virus, Lymphedema, Sickle Cell Disease Cardiovascular Patient has history of Coronary Artery Disease, Deep Vein Thrombosis - leg, Hypertension, Myocardial Infarction Endocrine Patient has history of Type II Diabetes Denies history of Type I Diabetes Genitourinary Denies history of End Stage Renal Disease Integumentary (Skin) Denies history of History of Burn Neurologic Patient has history of Seizure Disorder - focal sz Oncologic Denies history of Received Chemotherapy, Received Radiation Psychiatric Patient has history of Anorexia/bulimia - poor appetite Denies history of Confinement Anxiety Hospitalization/Surgery History - ORIF right radius 2019. - left hand IandD. - coronary angioplasty with stent 2004. - c-section. - bil cataract ectraction. Medical A Surgical History Notes nd Cardiovascular hyperlipidemia Objective Constitutional No acute distress. Vitals Time Taken: 9:48 AM, Height: 62 in, Weight: 114 lbs, BMI: 20.8, Temperature: 98.5 F, Pulse: 70 bpm, Respiratory Rate: 18 breaths/min, Blood Pressure: 120/51 mmHg, Capillary Blood Glucose: 120 mg/dl. Respiratory Normal work of breathing on room air. General Notes: 04/02/2022: The wound is smaller, more superficial, and without any significant slough accumulation. Integumentary (Hair, Skin) Wound #1 status is Open. Original cause of wound was Pressure Injury. The date acquired was: 02/05/2022. The wound has been in treatment 4 weeks. The wound is located on the Right Gluteus. The wound measures 0.6cm length x 1cm width x 0.1cm depth; 0.471cm^2 area and 0.047cm^3 volume. There is Fat Layer (Subcutaneous Tissue) exposed. There is no tunneling or undermining noted. There is a medium amount of serous drainage noted. The wound margin is distinct with the outline attached to the wound base. There is large  (67-100%) pink granulation within the wound bed. There is a small (1-33%) amount of necrotic tissue within the wound bed including Adherent Slough. Assessment Active Problems ICD-10 Pressure ulcer of right buttock, stage 3 Type 2 diabetes mellitus with other skin ulcer Unspecified severe protein-calorie malnutrition Essential (hemorrhagic) thrombocythemia Plan Follow-up Appointments: Return Appointment in 1 week. - Dr. Celine Ahr - Room 1 Friday 6/9 @ 10:30am Bathing/ Shower/ Hygiene: May shower and wash wound with soap and water. Off-Loading: Other: - stand at least hourly during the day Additional Orders / Instructions: Follow Nutritious Diet - add protein supplements to diet WOUND #1: - Gluteus Wound Laterality: Right  Peri-Wound Care: Skin Prep 1 x Per Day/30 Days Discharge Instructions: Use skin prep as directed Prim Dressing: Santyl Ointment 1 x Per Day/30 Days ary Discharge Instructions: Apply nickel thick amount to wound bed as instructed Secondary Dressing: Zetuvit Plus Silicone Border Dressing 4x4 (in/in) 1 x Per Day/30 Days Discharge Instructions: Apply silicone border over primary dressing as directed. 04/02/2022: The wound is smaller, more superficial, and without any significant slough accumulation. No debridement was necessary today. She was encouraged to continue her efforts at protein intake, as we are certainly seeing results from her improved nutrition. She should continue to offload. Continue Santyl. Follow-up in 1 week. Electronic Signature(s) Signed: 04/02/2022 10:30:34 AM By: Fredirick Maudlin MD FACS Entered By: Fredirick Maudlin on 04/02/2022 10:30:34 -------------------------------------------------------------------------------- HxROS Details Patient Name: Date of Service: [name] [name], [name] [name] 04/02/2022 9:45 A M Medical Record Number: [id number] Patient Account Number: [id number] Date of Birth/Sex: Treating RN: 1945/11/26 (76 y.o. [name] Primary Care  Provider: [name] Other Clinician: Referring Provider: Treating Provider/Extender: [name] in Treatment: 4 Information Obtained From Patient Chart Eyes Medical History: Positive for: Cataracts - bil removed Negative for: Glaucoma; Optic Neuritis Hematologic/Lymphatic Medical History: Positive for: Anemia Negative for: Hemophilia; Human Immunodeficiency Virus; Lymphedema; Sickle Cell Disease Cardiovascular Medical History: Positive for: Coronary Artery Disease; Deep Vein Thrombosis - leg; Hypertension; Myocardial Infarction Past Medical History Notes: hyperlipidemia Endocrine Medical History: Positive for: Type II Diabetes Negative for: Type I Diabetes Time with diabetes: age 34 Treated with: Oral agents Blood sugar tested every day: Yes Tested : once Genitourinary Medical History: Negative for: End Stage Renal Disease Integumentary (Skin) Medical History: Negative for: History of Burn Neurologic Medical History: Positive for: Seizure Disorder - focal sz Oncologic Medical History: Negative for: Received Chemotherapy; Received Radiation Psychiatric Medical History: Positive for: Anorexia/bulimia - poor appetite Negative for: Confinement Anxiety HBO Extended History Items Eyes: Cataracts Immunizations Pneumococcal Vaccine: Received Pneumococcal Vaccination: Yes Received Pneumococcal Vaccination On or After 60th Birthday: Yes Implantable Devices No devices added Hospitalization / Surgery History Type of Hospitalization/Surgery ORIF right radius 2019 left hand IandD coronary angioplasty with stent 2004 c-section bil cataract ectraction Family and Social History Cancer: No; Diabetes: No; Heart Disease: Yes - Child,Father; Hereditary Spherocytosis: No; Hypertension: Yes - Mother; Kidney Disease: No; Lung Disease: No; Seizures: No; Stroke: No; Thyroid Problems: No; Tuberculosis: No; Never smoker; Marital Status - Widowed; Alcohol  Use: Rarely; Drug Use: No History; Caffeine Use: Rarely; Financial Concerns: No; Food, Clothing or Shelter Needs: No; Support System Lacking: No; Transportation Concerns: No Engineer, maintenance) Signed: 04/02/2022 12:04:28 PM By: Fredirick Maudlin MD FACS Signed: 04/07/2022 5:13:49 PM By: Baruch Gouty RN, BSN Entered By: Fredirick Maudlin on 04/02/2022 10:25:25 -------------------------------------------------------------------------------- [place] Details Patient Name: Date of Service: [name] [name], [name] [name] 04/02/2022 Medical Record Number: [id number] Patient Account Number: [id number] Date of Birth/Sex: Treating RN: 08/13/1946 (76 y.o. [name] Other Clinician: Referring Provider: Treating Provider/Extender: [name] in Treatment: 4 Diagnosis Coding ICD-10 Codes Code Description [phone] Pressure ulcer of right buttock, stage 3 E11.622 Type 2 diabetes mellitus with other skin ulcer E43 Unspecified severe protein-calorie malnutrition D47.3 Essential (hemorrhagic) thrombocythemia Facility Procedures CPT4 Code: [id number] Description: 99213 - WOUND CARE VISIT-LEV 3 EST PT Modifier: Quantity: 1 Physician Procedures : CPT4 Code Description Modifier [id number] - WC PHYS LEVEL 3 - EST PT ICD-10 Diagnosis Description L89.313 Pressure ulcer of right buttock, stage 3 E11.622 Type 2 diabetes mellitus  with other skin ulcer E43 Unspecified severe protein-calorie  malnutrition D47.3 Essential (hemorrhagic) thrombocythemia Quantity: 1 Electronic Signature(s) Signed: 04/03/2022 8:58:31 AM By: Fredirick Maudlin MD FACS Signed: 04/03/2022 4:48:29 PM By: Deon Pilling RN, BSN Previous Signature: 04/02/2022 10:30:52 AM Version By: Fredirick Maudlin MD FACS Entered By: Deon Pilling on 04/02/2022 17:44:05

## 2022-04-07 NOTE — Progress Notes (Signed)
[name], [name] ([id number]) Visit Report for 04/02/2022 Arrival Information Details Patient Name: Date of Service: [place], [place] A. 04/02/2022 9:45 A M Medical Record Number: [id number] Patient Account Number: [id number] Date of Birth/Sex: Treating RN: 03-Jan-1946 (76 y.o. [name] [name] Primary Care Jairy Angulo: [name] Other Clinician: Referring Ollivander See: Treating Benedetto Ryder/Extender: [name] in Treatment: 4 Visit Information History Since Last Visit Added or deleted any medications: Yes Patient Arrived: Ambulatory Any new allergies or adverse reactions: No Arrival Time: 09:56 Had a fall or experienced change in No Accompanied By: daughter activities of daily living that may affect Transfer Assistance: None risk of falls: Patient Identification Verified: Yes Signs or symptoms of abuse/neglect since last visito No Secondary Verification Process Completed: Yes Hospitalized since last visit: No Patient Requires Transmission-Based Precautions: No Implantable device outside of the clinic excluding No Patient Has Alerts: Yes cellular tissue based products placed in the center Patient Alerts: Patient on Blood Thinner since last visit: Pain Present Now: No Electronic Signature(s) Signed: 04/03/2022 5:59:26 PM By: Sharyn Creamer RN, BSN Entered By: Sharyn Creamer on 04/02/2022 09:57:22 -------------------------------------------------------------------------------- Clinic Level of Care Assessment Details Patient Name: Date of Service: [name] Locket, [name] [name] 04/02/2022 9:45 A M Medical Record Number: [id number] Patient Account Number: [id number] Date of Birth/Sex: Treating RN: 26-Feb-1946 (76 y.o. Helene Shoe, Meta.Reding Primary Care Teller Wakefield: [name] Other Clinician: Referring Karington Zarazua: Treating Keoki Mchargue/Extender: [name] in Treatment: 4 Clinic Level of Care Assessment Items TOOL 4 Quantity Score X- 1 0 Use when only an EandM  is performed on FOLLOW-UP visit ASSESSMENTS - Nursing Assessment / Reassessment X- 1 10 Reassessment of Co-morbidities (includes updates in patient status) X- 1 5 Reassessment of Adherence to Treatment Plan ASSESSMENTS - Wound and Skin A ssessment / Reassessment X - Simple Wound Assessment / Reassessment - one wound 1 5 '[]'$  - 0 Complex Wound Assessment / Reassessment - multiple wounds X- 1 10 Dermatologic / Skin Assessment (not related to wound area) ASSESSMENTS - Focused Assessment '[]'$  - 0 Circumferential Edema Measurements - multi extremities '[]'$  - 0 Nutritional Assessment / Counseling / Intervention '[]'$  - 0 Lower Extremity Assessment (monofilament, tuning fork, pulses) '[]'$  - 0 Peripheral Arterial Disease Assessment (using hand held doppler) ASSESSMENTS - Ostomy and/or Continence Assessment and Care '[]'$  - 0 Incontinence Assessment and Management '[]'$  - 0 Ostomy Care Assessment and Management (repouching, etc.) PROCESS - Coordination of Care X - Simple Patient / Family Education for ongoing care 1 15 '[]'$  - 0 Complex (extensive) Patient / Family Education for ongoing care X- 1 10 Staff obtains Programmer, systems, Records, T Results / Process Orders est '[]'$  - 0 Staff telephones HHA, Nursing Homes / Clarify orders / etc '[]'$  - 0 Routine Transfer to another Facility (non-emergent condition) '[]'$  - 0 Routine Hospital Admission (non-emergent condition) '[]'$  - 0 New Admissions / Biomedical engineer / Ordering NPWT Apligraf, etc. , '[]'$  - 0 Emergency Hospital Admission (emergent condition) X- 1 10 Simple Discharge Coordination '[]'$  - 0 Complex (extensive) Discharge Coordination PROCESS - Special Needs '[]'$  - 0 Pediatric / Minor Patient Management '[]'$  - 0 Isolation Patient Management '[]'$  - 0 Hearing / Language / Visual special needs '[]'$  - 0 Assessment of Community assistance (transportation, D/C planning, etc.) '[]'$  - 0 Additional assistance / Altered mentation '[]'$  - 0 Support Surface(s)  Assessment (bed, cushion, seat, etc.) INTERVENTIONS - Wound Cleansing / Measurement X - Simple Wound Cleansing - one wound 1 5 '[]'$  - 0 Complex Wound Cleansing -  multiple wounds X- 1 5 Wound Imaging (photographs - any number of wounds) '[]'$  - 0 Wound Tracing (instead of photographs) X- 1 5 Simple Wound Measurement - one wound '[]'$  - 0 Complex Wound Measurement - multiple wounds INTERVENTIONS - Wound Dressings X - Small Wound Dressing one or multiple wounds 1 10 '[]'$  - 0 Medium Wound Dressing one or multiple wounds '[]'$  - 0 Large Wound Dressing one or multiple wounds '[]'$  - 0 Application of Medications - topical '[]'$  - 0 Application of Medications - injection INTERVENTIONS - Miscellaneous '[]'$  - 0 External ear exam '[]'$  - 0 Specimen Collection (cultures, biopsies, blood, body fluids, etc.) '[]'$  - 0 Specimen(s) / Culture(s) sent or taken to Lab for analysis '[]'$  - 0 Patient Transfer (multiple staff / Civil Service fast streamer / Similar devices) '[]'$  - 0 Simple Staple / Suture removal (25 or less) '[]'$  - 0 Complex Staple / Suture removal (26 or more) '[]'$  - 0 Hypo / Hyperglycemic Management (close monitor of Blood Glucose) '[]'$  - 0 Ankle / Brachial Index (ABI) - do not check if billed separately X- 1 5 Vital Signs Has the patient been seen at the hospital within the last three years: Yes Total Score: 95 Level Of Care: New/Established - Level 3 Electronic Signature(s) Signed: 04/03/2022 4:48:29 PM By: Deon Pilling RN, BSN Entered By: Deon Pilling on 04/02/2022 17:43:57 -------------------------------------------------------------------------------- Encounter Discharge Information Details Patient Name: Date of Service: [name] [place]ocket, [name] 04/02/2022 9:45 A M Medical Record Number: [id number] Patient Account Number: [id number] Date of Birth/Sex: Treating RN: 1946-06-19 (76 y.o. [name] [name] Primary Care Kennady Zimmerle: [name] Other Clinician: Referring Malanie Koloski: Treating Fatime Biswell/Extender: [name] in Treatment: 4 Encounter Discharge Information Items Discharge Condition: Stable Ambulatory Status: Ambulatory Discharge Destination: Home Transportation: Private Auto Accompanied By: daughter Schedule Follow-up Appointment: Yes Clinical Summary of Care: Patient Declined Electronic Signature(s) Signed: 04/03/2022 5:59:26 PM By: Sharyn Creamer RN, BSN Entered By: Sharyn Creamer on 04/02/2022 10:17:20 -------------------------------------------------------------------------------- Lower Extremity Assessment Details Patient Name: Date of Service: [name] Locket, [name] [name] 04/02/2022 9:45 A M Medical Record Number: [id number] Patient Account Number: [id number] Date of Birth/Sex: Treating RN: 17-Sep-1946 (76 y.o. [name] [name] Primary Care Korin Setzler: [name] Other Clinician: Referring Aiman Sonn: Treating Giuseppe Duchemin/Extender: [name] in Treatment: 4 Electronic Signature(s) Signed: 04/03/2022 5:59:26 PM By: Sharyn Creamer RN, BSN Entered By: Sharyn Creamer on 04/02/2022 09:59:49 -------------------------------------------------------------------------------- Multi Wound Chart Details Patient Name: Date of Service: [name] [place], [name] 04/02/2022 9:45 A M Medical Record Number: [id number] Patient Account Number: [id number] Date of Birth/Sex: Treating RN: [birth year] (76 y.o. [name] Primary Care Daniella Dewberry: Other Clinician: [name] Referring Jackquline Branca: Treating Jonovan Boedecker/Extender: [name] in Treatment: 4 Vital Signs Height(in): 59 Capillary Blood Glucose(mg/dl): 120 Weight(lbs): 114 Pulse(bpm): 20 Body Mass Index(BMI): 20.8 Blood Pressure(mmHg): 120/51 Temperature(F): 98.5 Respiratory Rate(breaths/min): 18 Photos: [N/A:N/A] Right Gluteus N/A N/A Wound Location: Pressure Injury N/A N/A Wounding Event: Pressure Ulcer N/A N/A Primary Etiology: Cataracts, Anemia, Coronary Artery N/A  N/A Comorbid History: Disease, Deep Vein Thrombosis, Hypertension, Myocardial Infarction, Type II Diabetes, Seizure Disorder, Anorexia/bulimia 02/05/2022 N/A N/A Date Acquired: 4 N/A N/A Weeks of Treatment: Open N/A N/A Wound Status: No N/A N/A Wound Recurrence: 0.6x1x0.1 N/A N/A Measurements L x W x D (cm) 0.471 N/A N/A A (cm) : rea 0.047 N/A N/A Volume (cm) : 76.60% N/A N/A % Reduction in A rea: 76.60% N/A N/A % Reduction in Volume: Category/Stage III N/A N/A Classification: Medium  N/A N/A Exudate A mount: Serous N/A N/A Exudate Type: amber N/A N/A Exudate Color: Distinct, outline attached N/A N/A Wound Margin: Large (67-100%) N/A N/A Granulation A mount: Pink N/A N/A Granulation Quality: Small (1-33%) N/A N/A Necrotic A mount: Fat Layer (Subcutaneous Tissue): Yes N/A N/A Exposed Structures: Fascia: No Tendon: No Muscle: No Joint: No Bone: No Large (67-100%) N/A N/A Epithelialization: Treatment Notes Wound #1 (Gluteus) Wound Laterality: Right Cleanser Peri-Wound Care Skin Prep Discharge Instruction: Use skin prep as directed Topical Primary Dressing Santyl Ointment Discharge Instruction: Apply nickel thick amount to wound bed as instructed Secondary Dressing Zetuvit Plus Silicone Border Dressing 4x4 (in/in) Discharge Instruction: Apply silicone border over primary dressing as directed. Secured With Compression Wrap Compression Stockings Environmental education officer) Signed: 04/02/2022 10:24:42 AM By: Fredirick Maudlin MD FACS Signed: 04/07/2022 5:13:49 PM By: Baruch Gouty RN, BSN Entered By: Fredirick Maudlin on 04/02/2022 10:24:42 -------------------------------------------------------------------------------- Multi-Disciplinary Care Plan Details Patient Name: Date of Service: [name] [place], [name] [name] 04/02/2022 9:45 A M Medical Record Number: [id number] Patient Account Number: [id number] Date of Birth/Sex: Treating RN: 07/06/1946 (76 y.o. [name] [name] Primary Care Lakeisha Waldrop: [name] Other Clinician: Referring Samyak Sackmann: Treating Asaph Serena/Extender: [name] in Treatment: 4 Multidisciplinary Care Plan reviewed with physician Active Inactive Abuse / Safety / Falls / Self Care Management Nursing Diagnoses: History of Falls Potential for falls Goals: Patient/caregiver will verbalize/demonstrate measures taken to prevent injury and/or falls Date Initiated: 03/05/2022 Target Resolution Date: 04/30/2022 Goal Status: Active Interventions: Assess fall risk on admission and as needed Assess impairment of mobility on admission and as needed per policy Notes: Nutrition Nursing Diagnoses: Imbalanced nutrition Impaired glucose control: actual or potential Potential for alteratiion in Nutrition/Potential for imbalanced nutrition Goals: Patient/caregiver will maintain therapeutic glucose control Date Initiated: 03/05/2022 Target Resolution Date: 04/30/2022 Goal Status: Active Interventions: Assess patient nutrition upon admission and as needed per policy Provide education on elevated blood sugars and impact on wound healing Provide education on nutrition Treatment Activities: Dietary management education, guidance and counseling : 03/05/2022 Education provided on Nutrition : 03/05/2022 Giving encouragement to exercise : 03/05/2022 Patient referred to Primary Care Physician for further nutritional evaluation : 03/05/2022 Notes: Wound/Skin Impairment Nursing Diagnoses: Impaired tissue integrity Knowledge deficit related to ulceration/compromised skin integrity Goals: Patient/caregiver will verbalize understanding of skin care regimen Date Initiated: 03/05/2022 Target Resolution Date: 04/30/2022 Goal Status: Active Ulcer/skin breakdown will have a volume reduction of 30% by week 4 Date Initiated: 03/05/2022 Target Resolution Date: 04/30/2022 Goal Status: Active Interventions: Assess patient/caregiver  ability to obtain necessary supplies Assess patient/caregiver ability to perform ulcer/skin care regimen upon admission and as needed Assess ulceration(s) every visit Provide education on ulcer and skin care Treatment Activities: Skin care regimen initiated : 03/05/2022 Topical wound management initiated : 03/05/2022 Notes: Electronic Signature(s) Signed: 04/03/2022 5:59:26 PM By: Sharyn Creamer RN, BSN Entered By: Sharyn Creamer on 04/02/2022 10:01:11 -------------------------------------------------------------------------------- Pain Assessment Details Patient Name: Date of Service: [name] [place], [name] 04/02/2022 9:45 A M Medical Record Number: [id number] Patient Account Number: [id number] Date of Birth/Sex: Treating RN: 01/25/1946 (76 y.o. [name] [name] Primary Care Dickie Cloe: [name] Other Clinician: Referring Amrita Radu: Treating Mellanie Bejarano/Extender: [name] in Treatment: 4 Active Problems Location of Pain Severity and Description of Pain Patient Has Paino No Site Locations Pain Management and Medication Current Pain Management: Electronic Signature(s) Signed: 04/03/2022 5:59:26 PM By: Sharyn Creamer RN, BSN Signed: 04/03/2022 5:59:26 PM By: Sharyn Creamer RN, BSN Entered By: Sharyn Creamer  on 04/02/2022 09:59:41 -------------------------------------------------------------------------------- Patient/Caregiver Education Details Patient Name: Date of Service: [name] Locket, [place] A. 5/31/2023andnbsp9:45 A M Medical Record Number: [id number] Patient Account Number: [id number] Date of Birth/Gender: Treating RN: 1946/08/23 (76 y.o. [name] [name] Primary Care Physician: [name] Other Clinician: Referring Physician: Treating Physician/Extender: [name] in Treatment: 4 Education Assessment Education Provided To: Patient Education Topics Provided Elevated Blood Sugar/ Impact on Healing: Methods:  Explain/Verbal Responses: State content correctly Nutrition: Methods: Explain/Verbal Responses: State content correctly Wound/Skin Impairment: Methods: Explain/Verbal Responses: State content correctly Electronic Signature(s) Signed: 04/03/2022 5:59:26 PM By: Sharyn Creamer RN, BSN Entered By: Sharyn Creamer on 04/02/2022 10:01:43 -------------------------------------------------------------------------------- Wound Assessment Details Patient Name: Date of Service: [name] [place], [name] 04/02/2022 9:45 A M Medical Record Number: [id number] Patient Account Number: [id number] Date of Birth/Sex: Treating RN: 10/06/1946 (76 y.o. [name] [name] Primary Care Evalette Montrose: [name] Other Clinician: Referring Lanina Larranaga: Treating Kelvis Berger/Extender: [name] in Treatment: 4 Wound Status Wound Number: 1 Primary Pressure Ulcer Etiology: Wound Location: Right Gluteus Wound Open Wounding Event: Pressure Injury Status: Date Acquired: 02/05/2022 Comorbid Cataracts, Anemia, Coronary Artery Disease, Deep Vein Weeks Of Treatment: 4 History: Thrombosis, Hypertension, Myocardial Infarction, Type II Diabetes, Clustered Wound: No Seizure Disorder, Anorexia/bulimia Photos Wound Measurements Length: (cm) 0.6 Width: (cm) 1 Depth: (cm) 0.1 Area: (cm) 0.471 Volume: (cm) 0.047 % Reduction in Area: 76.6% % Reduction in Volume: 76.6% Epithelialization: Large (67-100%) Tunneling: No Undermining: No Wound Description Classification: Category/Stage III Wound Margin: Distinct, outline attached Exudate Amount: Medium Exudate Type: Serous Exudate Color: amber Foul Odor After Cleansing: No Slough/Fibrino Yes Wound Bed Granulation Amount: Large (67-100%) Exposed Structure Granulation Quality: Pink Fascia Exposed: No Necrotic Amount: Small (1-33%) Fat Layer (Subcutaneous Tissue) Exposed: Yes Necrotic Quality: Adherent Slough Tendon Exposed: No Muscle Exposed: No Joint  Exposed: No Bone Exposed: No Treatment Notes Wound #1 (Gluteus) Wound Laterality: Right Cleanser Peri-Wound Care Skin Prep Discharge Instruction: Use skin prep as directed Topical Primary Dressing Santyl Ointment Discharge Instruction: Apply nickel thick amount to wound bed as instructed Secondary Dressing Zetuvit Plus Silicone Border Dressing 4x4 (in/in) Discharge Instruction: Apply silicone border over primary dressing as directed. Secured With Compression Wrap Compression Stockings Environmental education officer) Signed: 04/03/2022 5:59:26 PM By: Sharyn Creamer RN, BSN Entered By: Sharyn Creamer on 04/02/2022 09:56:17 -------------------------------------------------------------------------------- [place] Details Patient Name: Date of Service: [name] [place], [name] 04/02/2022 9:45 A M Medical Record Number: [id number] Patient Account Number: [id number] Date of Birth/Sex: Treating RN: Dec 13, 1945 (76 y.o. [name] [name] Primary Care Jonte Wollam: [name] Other Clinician: Referring Shikira Folino: Treating Kirke Breach/Extender: [name] in Treatment: 4 Vital Signs Time Taken: 09:48 Temperature (F): 98.5 Height (in): 62 Pulse (bpm): 70 Weight (lbs): 114 Respiratory Rate (breaths/min): 18 Body Mass Index (BMI): 20.8 Blood Pressure (mmHg): 120/51 Capillary Blood Glucose (mg/dl): 120 Reference Range: 80 - 120 mg / dl Electronic Signature(s) Signed: 04/03/2022 5:59:26 PM By: Sharyn Creamer RN, BSN Entered By: Sharyn Creamer on 04/02/2022 10:01:58

## 2022-04-11 ENCOUNTER — Encounter (HOSPITAL_BASED_OUTPATIENT_CLINIC_OR_DEPARTMENT_OTHER): Payer: Medicare Other | Attending: General Surgery | Admitting: General Surgery

## 2022-04-11 DIAGNOSIS — D473 Essential (hemorrhagic) thrombocythemia: Secondary | ICD-10-CM | POA: Insufficient documentation

## 2022-04-11 DIAGNOSIS — L89313 Pressure ulcer of right buttock, stage 3: Secondary | ICD-10-CM | POA: Diagnosis not present

## 2022-04-11 DIAGNOSIS — E43 Unspecified severe protein-calorie malnutrition: Secondary | ICD-10-CM | POA: Insufficient documentation

## 2022-04-11 DIAGNOSIS — E11622 Type 2 diabetes mellitus with other skin ulcer: Secondary | ICD-10-CM | POA: Diagnosis not present

## 2022-04-11 NOTE — Progress Notes (Addendum)
Bahner, Marjory A. ([id number]) Visit Report for 04/11/2022 Chief Complaint Document Details Patient Name: Date of Service: [name] [name], [place] A. 04/11/2022 10:30 A M Medical Record Number: [id number] Patient Account Number: [id number] Date of Birth/Sex: Treating RN: 13-Apr-1946 (76 y.o. [name] Other Clinician: Referring Provider: Treating Provider/Extender: [name] in Treatment: 5 Information Obtained from: Patient Chief Complaint Patient is at the clinic for treatment of an open pressure ulcer Electronic Signature(s) Signed: 04/11/2022 10:49:51 AM By: Fredirick Maudlin MD FACS Entered By: Fredirick Maudlin on 04/11/2022 10:49:51 -------------------------------------------------------------------------------- HPI Details Patient Name: Date of Service: [name] Locket, [name] [name] 04/11/2022 10:30 A M Medical Record Number: [id number] Patient Account Number: [id number] Date of Birth/Sex: Treating RN: 01-03-1946 (76 y.o. [name] Other Clinician: Referring Provider: Treating Provider/Extender: [name] in Treatment: 5 History of Present Illness HPI Description: ADMISSION 03/05/2022 This is a 76 year old woman with a past medical history notable for type 2 diabetes mellitus, hypertension, coronary artery disease, and essential thrombocythemia. Her husband died fairly recently and in that time she has lost a substantial amount of weight. She is accompanied by one of her daughters who is a Marine scientist. Her other daughter is also a Marine scientist and lives next door. On February 05, 2022, the patient notified her daughters that she had a sore on her right buttock. Apparently 2 weeks prior to that, she fell in the yard and landed on her backside, but landed on grass and did not seem to sustain any obvious trauma. Since reporting the wound to her daughters, they have used some Santyl that  they had leftover and have been using Medihoney since they ran out of that. She does not take insulin and her last hemoglobin A1c was reported to be 5.7. She is on hydroxyurea for essential thrombocythemia. On her right gluteus, there is a circular wound with thick fibrinous slough. There is some visible pink tissue underneath the slough at the periphery of the wound. No odor or significant drainage. 03/11/2022: The wound measured slightly larger today. It is a bit cleaner with less slough accumulation. There are buds of granulation tissue beginning to emerge. No concern for infection. 03/19/2022: The wound is a little bit smaller today. It still has about 50% surface covered with slough. There is granulation tissue continuing to emerge. 03/25/2022: The wound is smaller today. There is granulation tissue emerging through a thin layer of slough. The patient states that she is "gagging down" the protein supplements. 04/02/2022: Her wound is smaller, more superficial, and cleaner today. Her daughter has been monitoring her protein intake and the patient has been managing to get several doses of Pro-Stat daily. 04/11/2022: The wound continues to contract. The surface is much cleaner today. She is doing well with her protein intake due to her daughter's insistence. Electronic Signature(s) Signed: 04/11/2022 10:50:35 AM By: Fredirick Maudlin MD FACS Entered By: Fredirick Maudlin on 04/11/2022 10:50:35 -------------------------------------------------------------------------------- Physical Exam Details Patient Name: Date of Service: [name] Locket, [name] [name] 04/11/2022 10:30 A M Medical Record Number: [id number] Patient Account Number: [id number] Date of Birth/Sex: Treating RN: 06-03-1946 (76 y.o. [name] Other Clinician: Referring Provider: Treating Provider/Extender: [name] in Treatment: 5 Constitutional . . . . No acute  distress.Marland Kitchen Respiratory Normal work of breathing on room air.. Notes 04/11/2022: The wound continues to contract. The surface is very clean today. Electronic Signature(s)  Signed: 04/11/2022 10:51:26 AM By: Fredirick Maudlin MD FACS Entered By: Fredirick Maudlin on 04/11/2022 10:51:26 -------------------------------------------------------------------------------- Physician Orders Details Patient Name: Date of Service: [name] Locket, [name] [name] 04/11/2022 10:30 A M Medical Record Number: [id number] Patient Account Number: [id number] Date of Birth/Sex: Treating RN: January 23, 1946 (76 y.o. [name] Other Clinician: Referring Provider: Treating Provider/Extender: [name] in Treatment: 5 Verbal / Phone Orders: No Diagnosis Coding ICD-10 Coding Code Description L89.313 Pressure ulcer of right buttock, stage 3 E11.622 Type 2 diabetes mellitus with other skin ulcer E43 Unspecified severe protein-calorie malnutrition D47.3 Essential (hemorrhagic) thrombocythemia Follow-up Appointments ppointment in 1 week. - Dr. Celine Ahr - Room 1 Return A Thursday 6/15 @ 08:15 am Bathing/ Shower/ Hygiene May shower and wash wound with soap and water. Off-Loading Other: - stand at least hourly during the day Additional Orders / Instructions Follow Nutritious Diet - add protein supplements to diet Wound Treatment Wound #1 - Gluteus Wound Laterality: Right Peri-Wound Care: Skin Prep Every Other Day/30 Days Discharge Instructions: Use skin prep as directed Prim Dressing: Promogran Prisma Matrix, 4.34 (sq in) (silver collagen) Every Other Day/30 Days ary Discharge Instructions: Moisten collagen with saline or hydrogel Secondary Dressing: Zetuvit Plus Silicone Border Dressing 4x4 (in/in) Every Other Day/30 Days Discharge Instructions: Apply silicone border over primary dressing as directed. Electronic Signature(s) Signed: 04/11/2022 12:19:59 PM By:  Fredirick Maudlin MD FACS Entered By: Fredirick Maudlin on 04/11/2022 10:51:35 -------------------------------------------------------------------------------- Problem List Details Patient Name: Date of Service: [name] Locket, [name] [name] 04/11/2022 10:30 A M Medical Record Number: [id number] Patient Account Number: [id number] Date of Birth/Sex: Treating RN: [birth year] (76 y.o. [name] Other Clinician: Referring Provider: Treating Provider/Extender: [name] in Treatment: 5 Active Problems ICD-10 Encounter Code Description Active Date MDM Diagnosis L89.313 Pressure ulcer of right buttock, stage 3 03/05/2022 No Yes E11.622 Type 2 diabetes mellitus with other skin ulcer 03/05/2022 No Yes E43 Unspecified severe protein-calorie malnutrition 03/05/2022 No Yes D47.3 Essential (hemorrhagic) thrombocythemia 03/05/2022 No Yes Inactive Problems Resolved Problems Electronic Signature(s) Signed: 04/11/2022 10:49:38 AM By: Fredirick Maudlin MD FACS Entered By: Fredirick Maudlin on 04/11/2022 10:49:38 -------------------------------------------------------------------------------- Progress Note Details Patient Name: Date of Service: [name] Locket, [name] [name] 04/11/2022 10:30 A M Medical Record Number: [id number] Patient Account Number: [id number] Date of Birth/Sex: Treating RN: 1946/07/23 (76 y.o. [name] Other Clinician: Referring Provider: Treating Provider/Extender: [name] in Treatment: 5 Subjective Chief Complaint Information obtained from Patient Patient is at the clinic for treatment of an open pressure ulcer History of Present Illness (HPI) ADMISSION 03/05/2022 This is a 76 year old woman with a past medical history notable for type 2 diabetes mellitus, hypertension, coronary artery disease, and essential thrombocythemia. Her husband died fairly recently and in  that time she has lost a substantial amount of weight. She is accompanied by one of her daughters who is a Marine scientist. Her other daughter is also a Marine scientist and lives next door. On February 05, 2022, the patient notified her daughters that she had a sore on her right buttock. Apparently 2 weeks prior to that, she fell in the yard and landed on her backside, but landed on grass and did not seem to sustain any obvious trauma. Since reporting the wound to her daughters, they have used some Santyl that they had leftover and have been using Medihoney since they ran out of that. She does  not take insulin and her last hemoglobin A1c was reported to be 5.7. She is on hydroxyurea for essential thrombocythemia. On her right gluteus, there is a circular wound with thick fibrinous slough. There is some visible pink tissue underneath the slough at the periphery of the wound. No odor or significant drainage. 03/11/2022: The wound measured slightly larger today. It is a bit cleaner with less slough accumulation. There are buds of granulation tissue beginning to emerge. No concern for infection. 03/19/2022: The wound is a little bit smaller today. It still has about 50% surface covered with slough. There is granulation tissue continuing to emerge. 03/25/2022: The wound is smaller today. There is granulation tissue emerging through a thin layer of slough. The patient states that she is "gagging down" the protein supplements. 04/02/2022: Her wound is smaller, more superficial, and cleaner today. Her daughter has been monitoring her protein intake and the patient has been managing to get several doses of Pro-Stat daily. 04/11/2022: The wound continues to contract. The surface is much cleaner today. She is doing well with her protein intake due to her daughter's insistence. Patient History Information obtained from Patient, Chart. Family History Heart Disease - Child,Father, Hypertension - Mother, No family history of Cancer, Diabetes,  Hereditary Spherocytosis, Kidney Disease, Lung Disease, Seizures, Stroke, Thyroid Problems, Tuberculosis. Social History Never smoker, Marital Status - Widowed, Alcohol Use - Rarely, Drug Use - No History, Caffeine Use - Rarely. Medical History Eyes Patient has history of Cataracts - bil removed Denies history of Glaucoma, Optic Neuritis Hematologic/Lymphatic Patient has history of Anemia Denies history of Hemophilia, Human Immunodeficiency Virus, Lymphedema, Sickle Cell Disease Cardiovascular Patient has history of Coronary Artery Disease, Deep Vein Thrombosis - leg, Hypertension, Myocardial Infarction Endocrine Patient has history of Type II Diabetes Denies history of Type I Diabetes Genitourinary Denies history of End Stage Renal Disease Integumentary (Skin) Denies history of History of Burn Neurologic Patient has history of Seizure Disorder - focal sz Oncologic Denies history of Received Chemotherapy, Received Radiation Psychiatric Patient has history of Anorexia/bulimia - poor appetite Denies history of Confinement Anxiety Hospitalization/Surgery History - ORIF right radius 2019. - left hand IandD. - coronary angioplasty with stent 2004. - c-section. - bil cataract ectraction. Medical A Surgical History Notes nd Cardiovascular hyperlipidemia Objective Constitutional No acute distress.. Vitals Time Taken: 10:31 AM, Height: 62 in, Source: Stated, Weight: 114 lbs, Source: Stated, BMI: 20.8, Temperature: 97.9 F, Pulse: 65 bpm, Respiratory Rate: 18 breaths/min, Blood Pressure: 119/56 mmHg, Capillary Blood Glucose: 134 mg/dl. General Notes: glucose per pt report this am Respiratory Normal work of breathing on room air.. General Notes: 04/11/2022: The wound continues to contract. The surface is very clean today. Integumentary (Hair, Skin) Wound #1 status is Open. Original cause of wound was Pressure Injury. The date acquired was: 02/05/2022. The wound has been in treatment 5  weeks. The wound is located on the Right Gluteus. The wound measures 0.6cm length x 0.4cm width x 0.1cm depth; 0.188cm^2 area and 0.019cm^3 volume. There is Fat Layer (Subcutaneous Tissue) exposed. There is no tunneling or undermining noted. There is a small amount of serous drainage noted. The wound margin is distinct with the outline attached to the wound base. There is large (67-100%) pink granulation within the wound bed. There is a small (1-33%) amount of necrotic tissue within the wound bed including Adherent Slough. Assessment Active Problems ICD-10 Pressure ulcer of right buttock, stage 3 Type 2 diabetes mellitus with other skin ulcer Unspecified severe protein-calorie malnutrition Essential (hemorrhagic)  thrombocythemia Plan Follow-up Appointments: Return Appointment in 1 week. - Dr. Celine Ahr - Room 1 Thursday 6/15 @ 08:15 am Bathing/ Shower/ Hygiene: May shower and wash wound with soap and water. Off-Loading: Other: - stand at least hourly during the day Additional Orders / Instructions: Follow Nutritious Diet - add protein supplements to diet WOUND #1: - Gluteus Wound Laterality: Right Peri-Wound Care: Skin Prep Every Other Day/30 Days Discharge Instructions: Use skin prep as directed Prim Dressing: Promogran Prisma Matrix, 4.34 (sq in) (silver collagen) Every Other Day/30 Days ary Discharge Instructions: Moisten collagen with saline or hydrogel Secondary Dressing: Zetuvit Plus Silicone Border Dressing 4x4 (in/in) Every Other Day/30 Days Discharge Instructions: Apply silicone border over primary dressing as directed. 04/11/2022: The wound continues to contract. The surface is very clean today. No debridement was necessary today. We will change her dressing to Prisma silver collagen and a foam bordered dressing. She was encouraged to continue her excellent protein intake. Follow-up in 1 week. Electronic Signature(s) Signed: 04/11/2022 10:52:04 AM By: Fredirick Maudlin MD  FACS Entered By: Fredirick Maudlin on 04/11/2022 10:52:04 -------------------------------------------------------------------------------- HxROS Details Patient Name: Date of Service: [name] Locket, [name] [name] 04/11/2022 10:30 A M Medical Record Number: [id number] Patient Account Number: [id number] Date of Birth/Sex: Treating RN: 23-Jun-1946 (76 y.o. [name] Other Clinician: Referring Provider: Treating Provider/Extender: [name] in Treatment: 5 Information Obtained From Patient Chart Eyes Medical History: Positive for: Cataracts - bil removed Negative for: Glaucoma; Optic Neuritis Hematologic/Lymphatic Medical History: Positive for: Anemia Negative for: Hemophilia; Human Immunodeficiency Virus; Lymphedema; Sickle Cell Disease Cardiovascular Medical History: Positive for: Coronary Artery Disease; Deep Vein Thrombosis - leg; Hypertension; Myocardial Infarction Past Medical History Notes: hyperlipidemia Endocrine Medical History: Positive for: Type II Diabetes Negative for: Type I Diabetes Time with diabetes: age 70 Treated with: Oral agents Blood sugar tested every day: Yes Tested : once Genitourinary Medical History: Negative for: End Stage Renal Disease Integumentary (Skin) Medical History: Negative for: History of Burn Neurologic Medical History: Positive for: Seizure Disorder - focal sz Oncologic Medical History: Negative for: Received Chemotherapy; Received Radiation Psychiatric Medical History: Positive for: Anorexia/bulimia - poor appetite Negative for: Confinement Anxiety HBO Extended History Items Eyes: Cataracts Immunizations Pneumococcal Vaccine: Received Pneumococcal Vaccination: Yes Received Pneumococcal Vaccination On or After 60th Birthday: Yes Implantable Devices No devices added Hospitalization / Surgery History Type of Hospitalization/Surgery ORIF right radius 2019 left  hand IandD coronary angioplasty with stent 2004 c-section bil cataract ectraction Family and Social History Cancer: No; Diabetes: No; Heart Disease: Yes - Child,Father; Hereditary Spherocytosis: No; Hypertension: Yes - Mother; Kidney Disease: No; Lung Disease: No; Seizures: No; Stroke: No; Thyroid Problems: No; Tuberculosis: No; Never smoker; Marital Status - Widowed; Alcohol Use: Rarely; Drug Use: No History; Caffeine Use: Rarely; Financial Concerns: No; Food, Clothing or Shelter Needs: No; Support System Lacking: No; Transportation Concerns: No Engineer, maintenance) Signed: 04/11/2022 12:19:59 PM By: Fredirick Maudlin MD FACS Signed: 04/11/2022 5:13:26 PM By: Baruch Gouty RN, BSN Entered By: Fredirick Maudlin on 04/11/2022 10:50:52 -------------------------------------------------------------------------------- SuperBill Details Patient Name: Date of Service: [name] Locket, [name] [name] 04/11/2022 Medical Record Number: [id number] Patient Account Number: [id number] Date of Birth/Sex: Treating RN: 1946/10/08 (76 y.o. [name] Other Clinician: Referring Provider: Treating Provider/Extender: [name] in Treatment: 5 Diagnosis Coding ICD-10 Codes Code Description [phone] Pressure ulcer of right buttock, stage 3 E11.622 Type 2 diabetes mellitus with other skin ulcer E43 Unspecified  severe protein-calorie malnutrition D47.3 Essential (hemorrhagic) thrombocythemia Facility Procedures CPT4 Code: [id number] Description: [phone] - WOUND CARE VISIT-LEV 3 EST PT Modifier: Quantity: 1 Physician Procedures : CPT4 Code Description Modifier [id number] - WC PHYS LEVEL 3 - EST PT ICD-10 Diagnosis Description L89.313 Pressure ulcer of right buttock, stage 3 E11.622 Type 2 diabetes mellitus with other skin ulcer E43 Unspecified severe protein-calorie  malnutrition D47.3 Essential (hemorrhagic) thrombocythemia Quantity: 1 Electronic  Signature(s) Signed: 04/11/2022 10:52:15 AM By: Fredirick Maudlin MD FACS Entered By: Fredirick Maudlin on 04/11/2022 10:52:15

## 2022-04-11 NOTE — Progress Notes (Signed)
[name], [name] ([id number]) Visit Report for 04/11/2022 Arrival Information Details Patient Name: Date of Service: [name] [name], [name] A. 04/11/2022 10:30 A M Medical Record Number: [id number] Patient Account Number: [id number] Date of Birth/Sex: Treating RN: May 07, 1946 (76 y.o. [name] [name] Primary Care Blease Capaldi: [name] Other Clinician: Referring Shalee Paolo: Treating Myda Detwiler/Extender: [name] in Treatment: 5 Visit Information History Since Last Visit Added or deleted any medications: No Patient Arrived: Ambulatory Any new allergies or adverse reactions: No Arrival Time: 10:28 Had a fall or experienced change in No Accompanied By: daughter activities of daily living that may affect Transfer Assistance: None risk of falls: Secondary Verification Process Completed: Yes Signs or symptoms of abuse/neglect since last visito No Patient Requires Transmission-Based Precautions: No Hospitalized since last visit: No Patient Has Alerts: Yes Implantable device outside of the clinic excluding No Patient Alerts: Patient on Blood Thinner cellular tissue based products placed in the center since last visit: Has Dressing in Place as Prescribed: Yes Pain Present Now: No Electronic Signature(s) Signed: 04/11/2022 5:13:26 PM By: Baruch Gouty RN, BSN Entered By: Baruch Gouty on 04/11/2022 10:31:27 -------------------------------------------------------------------------------- Clinic Level of Care Assessment Details Patient Name: Date of Service: [name] [name], [name] [name] A. 04/11/2022 10:30 A M Medical Record Number: [id number] Patient Account Number: [id number] Date of Birth/Sex: Treating RN: Oct 15, 1946 (76 y.o. [name], [name] Primary Care Berdella Bacot: [name] Other Clinician: Referring Bettyjane Shenoy: Treating Evony Rezek/Extender: [name] in Treatment: 5 Clinic Level of Care Assessment Items TOOL 4 Quantity Score '[]'$  - 0 Use when only  an EandM is performed on FOLLOW-UP visit ASSESSMENTS - Nursing Assessment / Reassessment X- 1 10 Reassessment of Co-morbidities (includes updates in patient status) X- 1 5 Reassessment of Adherence to Treatment Plan ASSESSMENTS - Wound and Skin A ssessment / Reassessment X - Simple Wound Assessment / Reassessment - one wound 1 5 '[]'$  - 0 Complex Wound Assessment / Reassessment - multiple wounds '[]'$  - 0 Dermatologic / Skin Assessment (not related to wound area) ASSESSMENTS - Focused Assessment '[]'$  - 0 Circumferential Edema Measurements - multi extremities '[]'$  - 0 Nutritional Assessment / Counseling / Intervention '[]'$  - 0 Lower Extremity Assessment (monofilament, tuning fork, pulses) '[]'$  - 0 Peripheral Arterial Disease Assessment (using hand held doppler) ASSESSMENTS - Ostomy and/or Continence Assessment and Care '[]'$  - 0 Incontinence Assessment and Management '[]'$  - 0 Ostomy Care Assessment and Management (repouching, etc.) PROCESS - Coordination of Care X - Simple Patient / Family Education for ongoing care 1 15 '[]'$  - 0 Complex (extensive) Patient / Family Education for ongoing care X- 1 10 Staff obtains Programmer, systems, Records, T Results / Process Orders est '[]'$  - 0 Staff telephones HHA, Nursing Homes / Clarify orders / etc '[]'$  - 0 Routine Transfer to another Facility (non-emergent condition) '[]'$  - 0 Routine Hospital Admission (non-emergent condition) '[]'$  - 0 New Admissions / Biomedical engineer / Ordering NPWT Apligraf, etc. , '[]'$  - 0 Emergency Hospital Admission (emergent condition) X- 1 10 Simple Discharge Coordination '[]'$  - 0 Complex (extensive) Discharge Coordination PROCESS - Special Needs '[]'$  - 0 Pediatric / Minor Patient Management '[]'$  - 0 Isolation Patient Management '[]'$  - 0 Hearing / Language / Visual special needs '[]'$  - 0 Assessment of Community assistance (transportation, D/C planning, etc.) '[]'$  - 0 Additional assistance / Altered mentation '[]'$  - 0 Support Surface(s)  Assessment (bed, cushion, seat, etc.) INTERVENTIONS - Wound Cleansing / Measurement X - Simple Wound Cleansing - one wound 1 5 '[]'$  - 0  Complex Wound Cleansing - multiple wounds X- 1 5 Wound Imaging (photographs - any number of wounds) '[]'$  - 0 Wound Tracing (instead of photographs) X- 1 5 Simple Wound Measurement - one wound '[]'$  - 0 Complex Wound Measurement - multiple wounds INTERVENTIONS - Wound Dressings X - Small Wound Dressing one or multiple wounds 1 10 '[]'$  - 0 Medium Wound Dressing one or multiple wounds '[]'$  - 0 Large Wound Dressing one or multiple wounds '[]'$  - 0 Application of Medications - topical '[]'$  - 0 Application of Medications - injection INTERVENTIONS - Miscellaneous '[]'$  - 0 External ear exam '[]'$  - 0 Specimen Collection (cultures, biopsies, blood, body fluids, etc.) '[]'$  - 0 Specimen(s) / Culture(s) sent or taken to Lab for analysis '[]'$  - 0 Patient Transfer (multiple staff / Civil Service fast streamer / Similar devices) '[]'$  - 0 Simple Staple / Suture removal (25 or less) '[]'$  - 0 Complex Staple / Suture removal (26 or more) '[]'$  - 0 Hypo / Hyperglycemic Management (close monitor of Blood Glucose) '[]'$  - 0 Ankle / Brachial Index (ABI) - do not check if billed separately X- 1 5 Vital Signs Has the patient been seen at the hospital within the last three years: Yes Total Score: 85 Level Of Care: New/Established - Level 3 Electronic Signature(s) Signed: 04/11/2022 5:13:26 PM By: Baruch Gouty RN, BSN Entered By: Baruch Gouty on 04/11/2022 10:51:42 -------------------------------------------------------------------------------- Encounter Discharge Information Details Patient Name: Date of Service: [name] Locket, [place] [name] A. 04/11/2022 10:30 A M Medical Record Number: [id number] Patient Account Number: [id number] Date of Birth/Sex: Treating RN: 1946-01-06 (76 y.o. [name] [name] Primary Care Edwardine Deschepper: [name] Other Clinician: Referring Zylen Wenig: Treating Khylan Sawyer/Extender: [name] in Treatment: 5 Encounter Discharge Information Items Discharge Condition: Stable Ambulatory Status: Ambulatory Discharge Destination: Home Transportation: Private Auto Accompanied By: daughter Schedule Follow-up Appointment: Yes Clinical Summary of Care: Patient Declined Electronic Signature(s) Signed: 04/11/2022 5:13:26 PM By: Baruch Gouty RN, BSN Entered By: Baruch Gouty on 04/11/2022 10:57:36 -------------------------------------------------------------------------------- Lower Extremity Assessment Details Patient Name: Date of Service: [name] [name], [name] [name] A. 04/11/2022 10:30 A M Medical Record Number: [id number] Patient Account Number: [id number] Date of Birth/Sex: Treating RN: 1946/05/20 (77 y.o. [name] [name] Primary Care Karlina Suares: [name] Other Clinician: Referring Zaelyn Noack: Treating Meshawn Oconnor/Extender: [name] in Treatment: 5 Electronic Signature(s) Signed: 04/11/2022 5:13:26 PM By: Baruch Gouty RN, BSN Entered By: Baruch Gouty on 04/11/2022 10:34:11 -------------------------------------------------------------------------------- Multi Wound Chart Details Patient Name: Date of Service: [name] Locket, [place] [name] A. 04/11/2022 10:30 A M Medical Record Number: [id number] Patient Account Number: [id number] Date of Birth/Sex: Treating RN: [birth year] (76 y.o. [name] [name] Primary Care Nastasia Kage: [name] Other Clinician: Referring Jonette Wassel: Treating Ishan Sanroman/Extender: [name] in Treatment: 5 Vital Signs Height(in): 13 Capillary Blood Glucose(mg/dl): 134 Weight(lbs): 114 Pulse(bpm): 59 Body Mass Index(BMI): 20.8 Blood Pressure(mmHg): 119/56 Temperature(F): 97.9 Respiratory Rate(breaths/min): 18 Photos: [N/A:N/A] Right Gluteus N/A N/A Wound Location: Pressure Injury N/A N/A Wounding Event: Pressure Ulcer N/A N/A Primary Etiology: Cataracts, Anemia, Coronary Artery N/A  N/A Comorbid History: Disease, Deep Vein Thrombosis, Hypertension, Myocardial Infarction, Type II Diabetes, Seizure Disorder, Anorexia/bulimia 02/05/2022 N/A N/A Date Acquired: 5 N/A N/A Weeks of Treatment: Open N/A N/A Wound Status: No N/A N/A Wound Recurrence: 0.6x0.4x0.1 N/A N/A Measurements L x W x D (cm) 0.188 N/A N/A A (cm) : rea 0.019 N/A N/A Volume (cm) : 90.70% N/A N/A % Reduction in A rea: 90.50% N/A N/A % Reduction in Volume: Category/Stage III  N/A N/A Classification: Small N/A N/A Exudate A mount: Serous N/A N/A Exudate Type: amber N/A N/A Exudate Color: Distinct, outline attached N/A N/A Wound Margin: Large (67-100%) N/A N/A Granulation A mount: Pink N/A N/A Granulation Quality: Small (1-33%) N/A N/A Necrotic A mount: Fat Layer (Subcutaneous Tissue): Yes N/A N/A Exposed Structures: Fascia: No Tendon: No Muscle: No Joint: No Bone: No Small (1-33%) N/A N/A Epithelialization: Treatment Notes Electronic Signature(s) Signed: 04/11/2022 10:49:44 AM By: Fredirick Maudlin MD FACS Signed: 04/11/2022 5:13:26 PM By: Baruch Gouty RN, BSN Entered By: Fredirick Maudlin on 04/11/2022 10:49:44 -------------------------------------------------------------------------------- Multi-Disciplinary Care Plan Details Patient Name: Date of Service: [name] [name], [name] [name] A. 04/11/2022 10:30 A M Medical Record Number: [id number] Patient Account Number: [id number] Date of Birth/Sex: Treating RN: Dec 09, 1945 (76 y.o. [name] [name] Primary Care Bhumi Godbey: [name] Other Clinician: Referring Detria Cummings: Treating Mycala Warshawsky/Extender: [name] in Treatment: 5 Multidisciplinary Care Plan reviewed with physician Active Inactive Abuse / Safety / Falls / Self Care Management Nursing Diagnoses: History of Falls Potential for falls Goals: Patient/caregiver will verbalize/demonstrate measures taken to prevent injury and/or falls Date Initiated:  03/05/2022 Target Resolution Date: 04/30/2022 Goal Status: Active Interventions: Assess fall risk on admission and as needed Assess impairment of mobility on admission and as needed per policy Notes: Nutrition Nursing Diagnoses: Imbalanced nutrition Impaired glucose control: actual or potential Potential for alteratiion in Nutrition/Potential for imbalanced nutrition Goals: Patient/caregiver will maintain therapeutic glucose control Date Initiated: 03/05/2022 Target Resolution Date: 04/30/2022 Goal Status: Active Interventions: Assess patient nutrition upon admission and as needed per policy Provide education on elevated blood sugars and impact on wound healing Provide education on nutrition Treatment Activities: Dietary management education, guidance and counseling : 03/05/2022 Education provided on Nutrition : 04/02/2022 Giving encouragement to exercise : 03/05/2022 Patient referred to Primary Care Physician for further nutritional evaluation : 03/05/2022 Notes: Wound/Skin Impairment Nursing Diagnoses: Impaired tissue integrity Knowledge deficit related to ulceration/compromised skin integrity Goals: Patient/caregiver will verbalize understanding of skin care regimen Date Initiated: 03/05/2022 Target Resolution Date: 04/30/2022 Goal Status: Active Ulcer/skin breakdown will have a volume reduction of 30% by week 4 Date Initiated: 03/05/2022 Target Resolution Date: 04/30/2022 Goal Status: Active Interventions: Assess patient/caregiver ability to obtain necessary supplies Assess patient/caregiver ability to perform ulcer/skin care regimen upon admission and as needed Assess ulceration(s) every visit Provide education on ulcer and skin care Treatment Activities: Skin care regimen initiated : 03/05/2022 Topical wound management initiated : 03/05/2022 Notes: Electronic Signature(s) Signed: 04/11/2022 5:13:26 PM By: Baruch Gouty RN, BSN Entered By: Baruch Gouty on 04/11/2022  10:43:42 -------------------------------------------------------------------------------- Pain Assessment Details Patient Name: Date of Service: [name] Locket, [place] [name] A. 04/11/2022 10:30 A M Medical Record Number: [id number] Patient Account Number: [id number] Date of Birth/Sex: Treating RN: [birth year] (76 y.o. [name] [name] Primary Care Mliss Wedin: [name] Other Clinician: Referring Erico Stan: Treating Keesha Pellum/Extender: [name] in Treatment: 5 Active Problems Location of Pain Severity and Description of Pain Patient Has Paino No Site Locations Rate the pain. Current Pain Level: 0 Character of Pain Describe the Pain: Tender Pain Management and Medication Current Pain Management: Electronic Signature(s) Signed: 04/11/2022 5:13:26 PM By: Baruch Gouty RN, BSN Entered By: Baruch Gouty on 04/11/2022 10:34:05 -------------------------------------------------------------------------------- Patient/Caregiver Education Details Patient Name: Date of Service: [name] Locket, [place] [name] Loni Muse 6/9/2023andnbsp10:30 [place] Record Number: [id number] Patient Account Number: [id number] Date of Birth/Gender: Treating RN: 1946/08/14 (75 y.o. [name] [name] Primary Care Physician: [name] Other Clinician: Referring Physician: Treating Physician/Extender: Celine Ahr  Izola Price, Moses Manners in Treatment: 5 Education Assessment Education Provided To: Patient Education Topics Provided Elevated Blood Sugar/ Impact on Healing: Methods: Explain/Verbal Responses: Reinforcements needed, State content correctly Pressure: Methods: Explain/Verbal Responses: Reinforcements needed, State content correctly Wound/Skin Impairment: Methods: Explain/Verbal Responses: Reinforcements needed, State content correctly Electronic Signature(s) Signed: 04/11/2022 5:13:26 PM By: Baruch Gouty RN, BSN Entered By: Baruch Gouty on 04/11/2022  10:44:41 -------------------------------------------------------------------------------- Wound Assessment Details Patient Name: Date of Service: [name] Locket, [place] [name] A. 04/11/2022 10:30 A M Medical Record Number: [id number] Patient Account Number: [id number] Date of Birth/Sex: Treating RN: November 18, 1945 (76 y.o. [name] [name] Primary Care Garmon Dehn: [name] Other Clinician: Referring Trevelle Mcgurn: Treating Nicolo Tomko/Extender: [name] in Treatment: 5 Wound Status Wound Number: 1 Primary Pressure Ulcer Etiology: Wound Location: Right Gluteus Wound Open Wounding Event: Pressure Injury Status: Date Acquired: 02/05/2022 Comorbid Cataracts, Anemia, Coronary Artery Disease, Deep Vein Weeks Of Treatment: 5 History: Thrombosis, Hypertension, Myocardial Infarction, Type II Diabetes, Clustered Wound: No Seizure Disorder, Anorexia/bulimia Photos Wound Measurements Length: (cm) 0.6 Width: (cm) 0.4 Depth: (cm) 0.1 Area: (cm) 0.188 Volume: (cm) 0.019 % Reduction in Area: 90.7% % Reduction in Volume: 90.5% Epithelialization: Small (1-33%) Tunneling: No Undermining: No Wound Description Classification: Category/Stage III Wound Margin: Distinct, outline attached Exudate Amount: Small Exudate Type: Serous Exudate Color: amber Foul Odor After Cleansing: No Slough/Fibrino Yes Wound Bed Granulation Amount: Large (67-100%) Exposed Structure Granulation Quality: Pink Fascia Exposed: No Necrotic Amount: Small (1-33%) Fat Layer (Subcutaneous Tissue) Exposed: Yes Necrotic Quality: Adherent Slough Tendon Exposed: No Muscle Exposed: No Joint Exposed: No Bone Exposed: No Treatment Notes Wound #1 (Gluteus) Wound Laterality: Right Cleanser Peri-Wound Care Skin Prep Discharge Instruction: Use skin prep as directed Topical Primary Dressing Promogran Prisma Matrix, 4.34 (sq in) (silver collagen) Discharge Instruction: Moisten collagen with saline or  hydrogel Secondary Dressing Zetuvit Plus Silicone Border Dressing 4x4 (in/in) Discharge Instruction: Apply silicone border over primary dressing as directed. Secured With Compression Wrap Compression Stockings Environmental education officer) Signed: 04/11/2022 5:13:26 PM By: Baruch Gouty RN, BSN Entered By: Baruch Gouty on 04/11/2022 10:40:25 -------------------------------------------------------------------------------- Vitals Details Patient Name: Date of Service: [name] Locket, [place] [name] A. 04/11/2022 10:30 A M Medical Record Number: [id number] Patient Account Number: [id number] Date of Birth/Sex: Treating RN: 12/27/1945 (76 y.o. [name] [name] Primary Care Kenyonna Micek: [name] Other Clinician: Referring Limmie Schoenberg: Treating Abyan Cadman/Extender: [name] in Treatment: 5 Vital Signs Time Taken: 10:31 Temperature (F): 97.9 Height (in): 62 Pulse (bpm): 65 Source: Stated Respiratory Rate (breaths/min): 18 Weight (lbs): 114 Blood Pressure (mmHg): 119/56 Source: Stated Capillary Blood Glucose (mg/dl): 134 Body Mass Index (BMI): 20.8 Reference Range: 80 - 120 mg / dl Notes glucose per pt report this am Electronic Signature(s) Signed: 04/11/2022 5:13:26 PM By: Baruch Gouty RN, BSN Entered By: Baruch Gouty on 04/11/2022 10:33:52

## 2022-04-16 ENCOUNTER — Other Ambulatory Visit: Payer: Self-pay

## 2022-04-16 ENCOUNTER — Inpatient Hospital Stay (HOSPITAL_BASED_OUTPATIENT_CLINIC_OR_DEPARTMENT_OTHER): Payer: Medicare Other | Admitting: Internal Medicine

## 2022-04-16 ENCOUNTER — Encounter: Payer: Self-pay | Admitting: Internal Medicine

## 2022-04-16 ENCOUNTER — Inpatient Hospital Stay: Payer: Medicare Other | Attending: Internal Medicine

## 2022-04-16 VITALS — BP 126/53 | HR 72 | Temp 97.8°F | Resp 16 | Ht 62.0 in | Wt 117.8 lb

## 2022-04-16 DIAGNOSIS — D649 Anemia, unspecified: Secondary | ICD-10-CM | POA: Diagnosis not present

## 2022-04-16 DIAGNOSIS — D473 Essential (hemorrhagic) thrombocythemia: Secondary | ICD-10-CM | POA: Diagnosis not present

## 2022-04-16 DIAGNOSIS — Z79899 Other long term (current) drug therapy: Secondary | ICD-10-CM | POA: Diagnosis not present

## 2022-04-16 LAB — CBC WITH DIFFERENTIAL (CANCER CENTER ONLY)
Abs Immature Granulocytes: 0.02 10*3/uL (ref 0.00–0.07)
Basophils Absolute: 0 10*3/uL (ref 0.0–0.1)
Basophils Relative: 0 %
Eosinophils Absolute: 0.1 10*3/uL (ref 0.0–0.5)
Eosinophils Relative: 4 %
HCT: 27.4 % — ABNORMAL LOW (ref 36.0–46.0)
Hemoglobin: 8.6 g/dL — ABNORMAL LOW (ref 12.0–15.0)
Immature Granulocytes: 1 %
Lymphocytes Relative: 17 %
Lymphs Abs: 0.6 10*3/uL — ABNORMAL LOW (ref 0.7–4.0)
MCH: 34.4 pg — ABNORMAL HIGH (ref 26.0–34.0)
MCHC: 31.4 g/dL (ref 30.0–36.0)
MCV: 109.6 fL — ABNORMAL HIGH (ref 80.0–100.0)
Monocytes Absolute: 0.2 10*3/uL (ref 0.1–1.0)
Monocytes Relative: 5 %
Neutro Abs: 2.8 10*3/uL (ref 1.7–7.7)
Neutrophils Relative %: 73 %
Platelet Count: 659 10*3/uL — ABNORMAL HIGH (ref 150–400)
RBC: 2.5 MIL/uL — ABNORMAL LOW (ref 3.87–5.11)
RDW: 18.6 % — ABNORMAL HIGH (ref 11.5–15.5)
WBC Count: 3.8 10*3/uL — ABNORMAL LOW (ref 4.0–10.5)
nRBC: 0 % (ref 0.0–0.2)

## 2022-04-16 NOTE — Progress Notes (Signed)
[facility] [phone]   [phone]  OFFICE PROGRESS NOTE  [name], MD [place] [address]  DIAGNOSIS: Essential thrombocythemia with positive JAK2 mutation [id number] diagnosed in October 2020.  PRIOR THERAPY: None  CURRENT THERAPY: Hydroxyurea 500 mg p.o. daily except Monday and Thursday she is on 1000 mg.   INTERVAL HISTORY: [name] [name] 76 y.o. female returns to the clinic today for follow-up visit accompanied by her daughter [name] [name].  The patient is feeling fine today with no concerning complaints except for mild arthralgia and fatigue.  She continues to tolerate her treatment with hydroxyurea fairly well.  She denied having any chest pain, shortness of breath, cough or hemoptysis.  She has no nausea, vomiting, diarrhea or constipation.  She has no headache or visual changes.  She has no bleeding, bruises or ecchymosis.  She is here today for evaluation with repeat blood work.    MEDICAL HISTORY: Past Medical History:  Diagnosis Date   Anemia    Coronary artery disease    s/p stent to LAD and LCx   DM II (diabetes mellitus, type II), controlled ([facility])    DVT (deep venous thrombosis) ([facility])    Elevated LFTs    Hyperlipidemia    Hypertension    Memory loss    Myocardial infarction ([facility])    7 yrs ago    ALLERGIES:  is allergic to ampicillin, avelox [moxifloxacin hcl in nacl], bactrim [sulfamethoxazole-trimethoprim], ibuprofen, penicillins, diphenhydramine, sulfa antibiotics, codeine, latex, and whey.  MEDICATIONS:  Current Outpatient Medications  Medication Sig Dispense Refill   fenofibrate micronized (LOFIBRA) 200 MG capsule TAKE ONE CAPSULE BY MOUTH ONCE DAILY BEFORE BREAKFAST 90 capsule 2   ferrous sulfate 325 (65 FE) MG EC tablet Take 325 mg by mouth as needed.     Fish Oil-Cholecalciferol (FISH OIL + D3) 1000-1000 MG-UNIT CAPS Take 1 tablet by mouth 3 (three) times daily.     hydroxyurea (HYDREA) 500 MG  capsule TAKE 1 CAPSULE BY MOUTH ONCE DAILY. MAY TAKE WITH FOOD TO  MINIMIZE  GI  SIDE  EFFECTS. 90 capsule 2   levETIRAcetam (KEPPRA) 250 MG tablet Take 1 tablet (250 mg total) by mouth 2 (two) times daily. 180 tablet 4   metFORMIN (GLUCOPHAGE) 500 MG tablet Take 1 tablet by mouth daily.     nitroGLYCERIN (NITROSTAT) 0.4 MG SL tablet Place 1 tablet (0.4 mg total) under the tongue every 5 (five) minutes as needed. For chest pain. 25 tablet 0   omeprazole (PRILOSEC) 40 MG capsule Take by mouth.     prochlorperazine (COMPAZINE) 10 MG tablet Take 1 tablet (10 mg total) by mouth every 6 (six) hours as needed for nausea or vomiting. 30 tablet 2   rosuvastatin (CRESTOR) 20 MG tablet Take 10 mg by mouth daily.     warfarin (COUMADIN) 2 MG tablet Take 2 mg by mouth daily.     Zinc 25 MG TABS Take 1 tablet by mouth 2 (two) times daily.     No current facility-administered medications for this visit.    SURGICAL HISTORY:  Past Surgical History:  Procedure Laterality Date   CATARACT EXTRACTION     CESAREAN SECTION  1983   CORONARY ANGIOPLASTY WITH STENT PLACEMENT  04/26/2003   NORMAL. EF 65%   I & D EXTREMITY  06/22/2012   Procedure: IRRIGATION AND DEBRIDEMENT EXTREMITY;  Surgeon: Tennis Must, MD;  Location: [facility];  Service: Orthopedics;  Laterality: Left;  I & D EXTREMITY  06/29/2012   Procedure: IRRIGATION AND DEBRIDEMENT EXTREMITY;  Surgeon: Tennis Must, MD;  Location: [facility];  Service: Orthopedics;  Laterality: Left;   OPEN REDUCTION INTERNAL FIXATION (ORIF) DISTAL RADIAL FRACTURE Right 02/04/2018   Procedure: OPEN REDUCTION INTERNAL FIXATION (ORIF) RIGHT DISTAL RADIAL FRACTURE;  Surgeon: Leanora Cover, MD;  Location: [facility];  Service: Orthopedics;  Laterality: Right;    REVIEW OF SYSTEMS:  A comprehensive review of systems was negative except for: Constitutional: positive for fatigue Musculoskeletal: positive for arthralgias and back pain   PHYSICAL EXAMINATION: General  appearance: alert, cooperative, fatigued, and no distress Head: Normocephalic, without obvious abnormality, atraumatic Neck: no adenopathy, no JVD, supple, symmetrical, trachea midline, and thyroid not enlarged, symmetric, no tenderness/mass/nodules Lymph nodes: Cervical, supraclavicular, and axillary nodes normal. Resp: clear to auscultation bilaterally Back: symmetric, no curvature. ROM normal. No CVA tenderness. Cardio: regular rate and rhythm, S1, S2 normal, no murmur, click, rub or gallop GI: soft, non-tender; bowel sounds normal; no masses,  no organomegaly Extremities: extremities normal, atraumatic, no cyanosis or edema  ECOG PERFORMANCE STATUS: 1 - Symptomatic but completely ambulatory  Blood pressure (!) 126/53, pulse 72, temperature 97.8 F (36.6 C), temperature source Oral, resp. rate 16, height '5\' 2"'$  (1.575 m), weight 117 lb 12.8 oz (53.4 kg), SpO2 98 %.  LABORATORY DATA: Lab Results  Component Value Date   WBC 3.8 (L) 04/16/2022   HGB 8.6 (L) 04/16/2022   HCT 27.4 (L) 04/16/2022   MCV 109.6 (H) 04/16/2022   PLT 659 (H) 04/16/2022      Chemistry      Component Value Date/Time   NA 140 01/13/2022 1333   NA 141 01/29/2021 1107   K 4.1 01/13/2022 1333   CL 106 01/13/2022 1333   CO2 28 01/13/2022 1333   BUN 16 01/13/2022 1333   BUN 17 01/29/2021 1107   CREATININE 0.75 01/13/2022 1333      Component Value Date/Time   CALCIUM 8.9 01/13/2022 1333   ALKPHOS 117 01/13/2022 1333   AST 21 01/13/2022 1333   ALT 28 01/13/2022 1333   BILITOT 0.8 01/13/2022 1333       RADIOGRAPHIC STUDIES: No results found.  ASSESSMENT AND PLAN: This is a very pleasant 76 years old white female recently diagnosed with essential thrombocythemia with positive JAK2 mutation.  The patient also has anemia and leukocytosis secondary to the myeloproliferative disorder. Over the last 6 weeks she was on treatment with hydroxyurea 500 mg p.o. daily except on Monday and Thursday she will be on  1000 mg. The patient has been tolerating this treatment well. Repeat CBC today showed further elevation of her platelets count up to 659,000.  She has improvement in the total leukocyte count as well has a stable hemoglobin and hematocrit. I recommended for the patient to continue her treatment with hydroxyurea but we will increase the dose to 1000 mg every day except Monday, Wednesday and Friday she will be on 500 mg. I will see her back for follow-up visit in 4 weeks for evaluation and repeat blood work. She was advised to call immediately if she has any other concerning symptoms in the interval. The patient voices understanding of current disease status and treatment options and is in agreement with the current care plan.  All questions were answered. The patient knows to call the clinic with any problems, questions or concerns. We can certainly see the patient much sooner if necessary.  Disclaimer: This note was  dictated with voice recognition software. Similar sounding words can inadvertently be transcribed and may not be corrected upon review.       

## 2022-04-17 ENCOUNTER — Encounter (HOSPITAL_BASED_OUTPATIENT_CLINIC_OR_DEPARTMENT_OTHER): Payer: Medicare Other | Admitting: General Surgery

## 2022-04-17 DIAGNOSIS — L89313 Pressure ulcer of right buttock, stage 3: Secondary | ICD-10-CM | POA: Diagnosis not present

## 2022-04-17 DIAGNOSIS — D473 Essential (hemorrhagic) thrombocythemia: Secondary | ICD-10-CM | POA: Diagnosis not present

## 2022-04-17 DIAGNOSIS — E11622 Type 2 diabetes mellitus with other skin ulcer: Secondary | ICD-10-CM | POA: Diagnosis not present

## 2022-04-17 DIAGNOSIS — E1169 Type 2 diabetes mellitus with other specified complication: Secondary | ICD-10-CM | POA: Diagnosis not present

## 2022-04-17 DIAGNOSIS — L98412 Non-pressure chronic ulcer of buttock with fat layer exposed: Secondary | ICD-10-CM | POA: Diagnosis not present

## 2022-04-17 DIAGNOSIS — E43 Unspecified severe protein-calorie malnutrition: Secondary | ICD-10-CM | POA: Diagnosis not present

## 2022-04-17 NOTE — Progress Notes (Signed)
[place], Kimra A. ([id number]) Visit Report for 04/17/2022 Arrival Information Details Patient Name: Date of Service: [place], [place] A. 04/17/2022 8:15 A M Medical Record Number: [id number] Patient Account Number: [id number] Date of Birth/Sex: Treating RN: 23-Nov-1945 (76 y.o. Nancy Fetter Primary Care Kaydyn Chism: [name] Other Clinician: Referring Heidi Lemay: Treating Chun Sellen/Extender: [name] in Treatment: 6 Visit Information History Since Last Visit Added or deleted any medications: No Patient Arrived: Ambulatory Any new allergies or adverse reactions: No Arrival Time: 08:58 Had a fall or experienced change in No Accompanied By: daughter activities of daily living that may affect Transfer Assistance: None risk of falls: Patient Identification Verified: Yes Signs or symptoms of abuse/neglect since last visito No Secondary Verification Process Completed: Yes Hospitalized since last visit: No Patient Requires Transmission-Based Precautions: No Implantable device outside of the clinic excluding No Patient Has Alerts: Yes cellular tissue based products placed in the center Patient Alerts: Patient on Blood Thinner since last visit: Has Dressing in Place as Prescribed: Yes Pain Present Now: Yes Electronic Signature(s) Signed: 04/17/2022 5:36:06 PM By: Levan Hurst RN, BSN Entered By: Levan Hurst on 04/17/2022 08:58:26 -------------------------------------------------------------------------------- Encounter Discharge Information Details Patient Name: Date of Service: [name], [name] 04/17/2022 8:15 A M Medical Record Number: [id number] Patient Account Number: [id number] Date of Birth/Sex: Treating RN: 1946/09/13 (76 y.o. [name] Primary Care Kaniesha Barile: [name] Other Clinician: Referring Jashaun Penrose: Treating Olive Zmuda/Extender: [name] in Treatment: 6 Encounter Discharge Information Items Post  Procedure Vitals Discharge Condition: Stable Temperature (F): 98.3 Ambulatory Status: Ambulatory Pulse (bpm): 67 Discharge Destination: Home Respiratory Rate (breaths/min): 18 Transportation: Private Auto Blood Pressure (mmHg): 125/61 Accompanied By: daughter Schedule Follow-up Appointment: Yes Clinical Summary of Care: Patient Declined Electronic Signature(s) Signed: 04/17/2022 6:00:36 PM By: Baruch Gouty RN, BSN Entered By: Baruch Gouty on 04/17/2022 09:18:55 -------------------------------------------------------------------------------- Lower Extremity Assessment Details Patient Name: Date of Service: [name], [name] [name] 04/17/2022 8:15 A M Medical Record Number: [id number] Patient Account Number: [id number] Date of Birth/Sex: Treating RN: 1946-05-30 (76 y.o. Nancy Fetter Primary Care Chancie Lampert: [name] Other Clinician: Referring Gregoria Selvy: Treating [place]/Extender: [name] in Treatment: 6 Electronic Signature(s) Signed: 04/17/2022 5:36:06 PM By: Levan Hurst RN, BSN Entered By: Levan Hurst on 04/17/2022 09:04:04 -------------------------------------------------------------------------------- Multi Wound Chart Details Patient Name: Date of Service: [name] 04/17/2022 8:15 A M Medical Record Number: [id number] Patient Account Number: [id number] Date of Birth/Sex: Treating RN: 06-12-1946 (76 y.o. [name] Primary Care Bellany Elbaum: [name] Other Clinician: Referring Damaris Geers: Treating Anel Purohit/Extender: [name] in Treatment: 6 Vital Signs Height(in): 62 Capillary Blood Glucose(mg/dl): 121 Weight(lbs): 114 Pulse(bpm): 62 Body Mass Index(BMI): 20.8 Blood Pressure(mmHg): 125/61 Temperature(F): 98.3 Respiratory Rate(breaths/min): 16 Photos: [N/A:N/A] Right Gluteus N/A N/A Wound Location: Pressure Injury N/A N/A Wounding Event: Pressure Ulcer N/A N/A Primary  Etiology: Cataracts, Anemia, Coronary Artery N/A N/A Comorbid History: Disease, Deep Vein Thrombosis, Hypertension, Myocardial Infarction, Type II Diabetes, Seizure Disorder, Anorexia/bulimia 02/05/2022 N/A N/A Date Acquired: 6 N/A N/A Weeks of Treatment: Open N/A N/A Wound Status: No N/A N/A Wound Recurrence: 0.5x0.4x0.1 N/A N/A Measurements L x W x D (cm) 0.157 N/A N/A A (cm) : rea 0.016 N/A N/A Volume (cm) : 92.20% N/A N/A % Reduction in A rea: 92.00% N/A N/A % Reduction in Volume: Category/Stage III N/A N/A Classification: Small N/A N/A Exudate A mount: Serous N/A N/A Exudate Type: amber N/A N/A Exudate Color: Distinct, outline  attached N/A N/A Wound Margin: Large (67-100%) N/A N/A Granulation Amount: Pink N/A N/A Granulation Quality: Small (1-33%) N/A N/A Necrotic Amount: Fat Layer (Subcutaneous Tissue): Yes N/A N/A Exposed Structures: Fascia: No Tendon: No Muscle: No Joint: No Bone: No Medium (34-66%) N/A N/A Epithelialization: Debridement - Selective/Open Wound N/A N/A Debridement: Pre-procedure Verification/Time Out 09:05 N/A N/A Taken: Lidocaine 4% Topical Solution N/A N/A Pain Control: Slough N/A N/A Tissue Debrided: Non-Viable Tissue N/A N/A Level: 0.2 N/A N/A Debridement A (sq cm): rea Curette N/A N/A Instrument: Minimum N/A N/A Bleeding: Pressure N/A N/A Hemostasis A chieved: 1 N/A N/A Procedural Pain: 0 N/A N/A Post Procedural Pain: Procedure was tolerated well N/A N/A Debridement Treatment Response: 0.5x0.4x0.1 N/A N/A Post Debridement Measurements L x W x D (cm) 0.016 N/A N/A Post Debridement Volume: (cm) Category/Stage III N/A N/A Post Debridement Stage: Debridement N/A N/A Procedures Performed: Treatment Notes Electronic Signature(s) Signed: 04/17/2022 9:14:03 AM By: Fredirick Maudlin MD FACS Signed: 04/17/2022 6:00:36 PM By: Baruch Gouty RN, BSN Entered By: Fredirick Maudlin on 04/17/2022  09:14:02 -------------------------------------------------------------------------------- Multi-Disciplinary Care Plan Details Patient Name: Date of Service: [name], [name] 04/17/2022 8:15 A M Medical Record Number: [id number] Patient Account Number: [id number] Date of Birth/Sex: Treating RN: 06/10/1946 (76 y.o. Nancy Fetter Primary Care Izzie Geers: [name] Other Clinician: Referring Karene Bracken: Treating Cyril Railey/Extender: [name] in Treatment: 6 Multidisciplinary Care Plan reviewed with physician Active Inactive Abuse / Safety / Falls / Self Care Management Nursing Diagnoses: History of Falls Potential for falls Goals: Patient/caregiver will verbalize/demonstrate measures taken to prevent injury and/or falls Date Initiated: 03/05/2022 Target Resolution Date: 04/30/2022 Goal Status: Active Interventions: Assess fall risk on admission and as needed Assess impairment of mobility on admission and as needed per policy Notes: Nutrition Nursing Diagnoses: Imbalanced nutrition Impaired glucose control: actual or potential Potential for alteratiion in Nutrition/Potential for imbalanced nutrition Goals: Patient/caregiver will maintain therapeutic glucose control Date Initiated: 03/05/2022 Target Resolution Date: 04/30/2022 Goal Status: Active Interventions: Assess patient nutrition upon admission and as needed per policy Provide education on elevated blood sugars and impact on wound healing Provide education on nutrition Treatment Activities: Dietary management education, guidance and counseling : 03/05/2022 Education provided on Nutrition : 03/05/2022 Giving encouragement to exercise : 03/05/2022 Patient referred to Primary Care Physician for further nutritional evaluation : 03/05/2022 Notes: Wound/Skin Impairment Nursing Diagnoses: Impaired tissue integrity Knowledge deficit related to ulceration/compromised skin integrity Goals: Patient/caregiver  will verbalize understanding of skin care regimen Date Initiated: 03/05/2022 Target Resolution Date: 04/30/2022 Goal Status: Active Ulcer/skin breakdown will have a volume reduction of 30% by week 4 Date Initiated: 03/05/2022 Target Resolution Date: 04/30/2022 Goal Status: Active Interventions: Assess patient/caregiver ability to obtain necessary supplies Assess patient/caregiver ability to perform ulcer/skin care regimen upon admission and as needed Assess ulceration(s) every visit Provide education on ulcer and skin care Treatment Activities: Skin care regimen initiated : 03/05/2022 Topical wound management initiated : 03/05/2022 Notes: Electronic Signature(s) Signed: 04/17/2022 5:36:06 PM By: Levan Hurst RN, BSN Entered By: Levan Hurst on 04/17/2022 09:04:23 -------------------------------------------------------------------------------- Pain Assessment Details Patient Name: Date of Service: [name] 04/17/2022 8:15 A M Medical Record Number: [id number] Patient Account Number: [id number] Date of Birth/Sex: Treating RN: [birth year] (76 y.o. Nancy Fetter Primary Care Seila Liston: [name] Other Clinician: Referring Emelie Newsom: Treating Jj Enyeart/Extender: [name] in Treatment: 6 Active Problems Location of Pain Severity and Description of Pain Patient Has Paino Yes Site Locations Pain Location: Pain Location: Pain  in Ulcers With Dressing Change: Yes Duration of the Pain. Constant / Intermittento Intermittent Rate the pain. Current Pain Level: 4 Character of Pain Describe the Pain: Aching Pain Management and Medication Current Pain Management: Medication: Yes Cold Application: No Rest: No Massage: No Activity: No T.E.N.S.: No Heat Application: No Leg drop or elevation: No Is the Current Pain Management Adequate: Adequate How does your wound impact your activities of daily livingo Sleep: No Bathing: No Appetite:  No Relationship With Others: No Bladder Continence: No Emotions: No Bowel Continence: No Work: No Toileting: No Drive: No Dressing: No Hobbies: No Engineer, maintenance) Signed: 04/17/2022 5:36:06 PM By: Levan Hurst RN, BSN Entered By: Levan Hurst on 04/17/2022 08:59:11 -------------------------------------------------------------------------------- Patient/Caregiver Education Details Patient Name: Date of Service: [name] 6/15/2023andnbsp8:15 A M Medical Record Number: [id number] Patient Account Number: [id number] Date of Birth/Gender: Treating RN: 05-Feb-1946 (76 y.o. Nancy Fetter Primary Care Physician: [name] Other Clinician: Referring Physician: Treating Physician/Extender: [name] in Treatment: 6 Education Assessment Education Provided To: Patient Education Topics Provided Pressure: Methods: Explain/Verbal Responses: Reinforcements needed, State content correctly Wound/Skin Impairment: Methods: Explain/Verbal Responses: Reinforcements needed, State content correctly Electronic Signature(s) Signed: 04/17/2022 5:36:06 PM By: Levan Hurst RN, BSN Entered By: Levan Hurst on 04/17/2022 09:04:45 -------------------------------------------------------------------------------- Wound Assessment Details Patient Name: Date of Service: [name] 04/17/2022 8:15 A M Medical Record Number: [id number] Patient Account Number: [id number] Date of Birth/Sex: Treating RN: 25-May-1946 (76 y.o. Nancy Fetter Primary Care Jereld Presti: [name] Other Clinician: Referring Lai Hendriks: Treating Herbert Marken/Extender: [name] in Treatment: 6 Wound Status Wound Number: 1 Primary Pressure Ulcer Etiology: Wound Location: Right Gluteus Wound Open Wounding Event: Pressure Injury Status: Date Acquired: 02/05/2022 Comorbid Cataracts, Anemia, Coronary Artery Disease, Deep Vein Weeks Of Treatment:  6 History: Thrombosis, Hypertension, Myocardial Infarction, Type II Diabetes, Clustered Wound: No Seizure Disorder, Anorexia/bulimia Photos Wound Measurements Length: (cm) 0.5 Width: (cm) 0.4 Depth: (cm) 0.1 Area: (cm) 0.157 Volume: (cm) 0.016 % Reduction in Area: 92.2% % Reduction in Volume: 92% Epithelialization: Medium (34-66%) Tunneling: No Undermining: No Wound Description Classification: Category/Stage III Wound Margin: Distinct, outline attached Exudate Amount: Small Exudate Type: Serous Exudate Color: amber Foul Odor After Cleansing: No Slough/Fibrino Yes Wound Bed Granulation Amount: Large (67-100%) Exposed Structure Granulation Quality: Pink Fascia Exposed: No Necrotic Amount: Small (1-33%) Fat Layer (Subcutaneous Tissue) Exposed: Yes Necrotic Quality: Adherent Slough Tendon Exposed: No Muscle Exposed: No Joint Exposed: No Bone Exposed: No Treatment Notes Wound #1 (Gluteus) Wound Laterality: Right Cleanser Peri-Wound Care Skin Prep Discharge Instruction: Use skin prep as directed Topical Primary Dressing Promogran Prisma Matrix, 4.34 (sq in) (silver collagen) Discharge Instruction: Moisten collagen with saline or hydrogel Secondary Dressing Zetuvit Plus Silicone Border Dressing 4x4 (in/in) Discharge Instruction: Apply silicone border over primary dressing as directed. Secured With Compression Wrap Compression Stockings Environmental education officer) Signed: 04/17/2022 5:36:06 PM By: Levan Hurst RN, BSN Entered By: Levan Hurst on 04/17/2022 09:04:42 -------------------------------------------------------------------------------- [place] Details Patient Name: Date of Service: [name] 04/17/2022 8:15 A M Medical Record Number: [id number] Patient Account Number: [id number] Date of Birth/Sex: Treating RN: August 18, 1946 (76 y.o. Nancy Fetter Primary Care Mikell Camp: [name] Other Clinician: Referring Ruie Sendejo: Treating  Hiliary Osorto/Extender: [name] in Treatment: 6 Vital Signs Time Taken: 08:58 Temperature (F): 98.3 Height (in): 62 Pulse (bpm): 67 Weight (lbs): 114 Respiratory Rate (breaths/min): 16 Body Mass Index (BMI): 20.8 Blood Pressure (mmHg): 125/61 Capillary Blood Glucose (  mg/dl): 121 Reference Range: 80 - 120 mg / dl Notes glucose per pt report this am Electronic Signature(s) Signed: 04/17/2022 5:36:06 PM By: Levan Hurst RN, BSN Entered By: Levan Hurst on 04/17/2022 08:58:53

## 2022-04-17 NOTE — Progress Notes (Signed)
Campion, Lacey A. ([id number]) Visit Report for 04/17/2022 Chief Complaint Document Details Patient Name: Date of Service: [name] [name], [place] A. 04/17/2022 8:15 A M Medical Record Number: [id number] Patient Account Number: [id number] Date of Birth/Sex: Treating RN: 03-Jan-1946 (76 y.o. [name] Other Clinician: Referring Provider: Treating Provider/Extender: [name] in Treatment: 6 Information Obtained from: Patient Chief Complaint Patient is at the clinic for treatment of an open pressure ulcer Electronic Signature(s) Signed: 04/17/2022 9:14:08 AM By: Fredirick Maudlin MD FACS Entered By: Fredirick Maudlin on 04/17/2022 09:14:08 -------------------------------------------------------------------------------- Debridement Details Patient Name: Date of Service: [name] Locket, [name] [name] 04/17/2022 8:15 A M Medical Record Number: [id number] Patient Account Number: [id number] Date of Birth/Sex: Treating RN: Oct 25, 1946 (76 y.o. [name] Other Clinician: Referring Provider: Treating Provider/Extender: [name] in Treatment: 6 Debridement Performed for Assessment: Wound #1 Right Gluteus Performed By: Physician Fredirick Maudlin, MD Debridement Type: Debridement Level of Consciousness (Pre-procedure): Awake and Alert Pre-procedure Verification/Time Out Yes - 09:05 Taken: Start Time: 09:17 Pain Control: Lidocaine 4% T opical Solution T Area Debrided (L x W): otal 0.5 (cm) x 0.4 (cm) = 0.2 (cm) Tissue and other material debrided: Non-Viable, Slough, Slough Level: Non-Viable Tissue Debridement Description: Selective/Open Wound Instrument: Curette Bleeding: Minimum Hemostasis Achieved: Pressure Procedural Pain: 1 Post Procedural Pain: 0 Response to Treatment: Procedure was tolerated well Level of Consciousness (Post- Awake and Alert procedure): Post  Debridement Measurements of Total Wound Length: (cm) 0.5 Stage: Category/Stage III Width: (cm) 0.4 Depth: (cm) 0.1 Volume: (cm) 0.016 Character of Wound/Ulcer Post Debridement: Improved Post Procedure Diagnosis Same as Pre-procedure Electronic Signature(s) Signed: 04/17/2022 10:07:19 AM By: Fredirick Maudlin MD FACS Signed: 04/17/2022 6:00:36 PM By: Baruch Gouty RN, BSN Entered By: Baruch Gouty on 04/17/2022 09:10:43 -------------------------------------------------------------------------------- HPI Details Patient Name: Date of Service: [name] Locket, [name] [name] 04/17/2022 8:15 A M Medical Record Number: [id number] Patient Account Number: [id number] Date of Birth/Sex: Treating RN: July 09, 1946 (76 y.o. [name] Other Clinician: Referring Provider: Treating Provider/Extender: [name] in Treatment: 6 History of Present Illness HPI Description: ADMISSION 03/05/2022 This is a 76 year old woman with a past medical history notable for type 2 diabetes mellitus, hypertension, coronary artery disease, and essential thrombocythemia. Her husband died fairly recently and in that time she has lost a substantial amount of weight. She is accompanied by one of her daughters who is a Marine scientist. Her other daughter is also a Marine scientist and lives next door. On February 05, 2022, the patient notified her daughters that she had a sore on her right buttock. Apparently 2 weeks prior to that, she fell in the yard and landed on her backside, but landed on grass and did not seem to sustain any obvious trauma. Since reporting the wound to her daughters, they have used some Santyl that they had leftover and have been using Medihoney since they ran out of that. She does not take insulin and her last hemoglobin A1c was reported to be 5.7. She is on hydroxyurea for essential thrombocythemia. On her right gluteus, there is a circular wound with thick fibrinous  slough. There is some visible pink tissue underneath the slough at the periphery of the wound. No odor or significant drainage. 03/11/2022: The wound measured slightly larger today. It is a bit cleaner with less slough accumulation. There are buds of granulation tissue beginning to emerge. No concern for  infection. 03/19/2022: The wound is a little bit smaller today. It still has about 50% surface covered with slough. There is granulation tissue continuing to emerge. 03/25/2022: The wound is smaller today. There is granulation tissue emerging through a thin layer of slough. The patient states that she is "gagging down" the protein supplements. 04/02/2022: Her wound is smaller, more superficial, and cleaner today. Her daughter has been monitoring her protein intake and the patient has been managing to get several doses of Pro-Stat daily. 04/11/2022: The wound continues to contract. The surface is much cleaner today. She is doing well with her protein intake due to her daughter's insistence. 04/17/2022: The wound is smaller again today. There is a light layer of slough on the surface. Electronic Signature(s) Signed: 04/17/2022 9:14:32 AM By: Fredirick Maudlin MD FACS Entered By: Fredirick Maudlin on 04/17/2022 09:14:31 -------------------------------------------------------------------------------- Physical Exam Details Patient Name: Date of Service: [name] Locket, [name] [name] 04/17/2022 8:15 A M Medical Record Number: [id number] Patient Account Number: [id number] Date of Birth/Sex: Treating RN: 1946-06-07 (76 y.o. [name] Other Clinician: Referring Provider: Treating Provider/Extender: [name] in Treatment: 6 Constitutional . . . . No acute distress.Marland Kitchen Respiratory Normal work of breathing on room air.. Notes 04/17/2022: The wound is smaller again today. There is a light layer of slough on the surface. Under the slough, there is  nice-looking granulation tissue. Electronic Signature(s) Signed: 04/17/2022 9:15:17 AM By: Fredirick Maudlin MD FACS Entered By: Fredirick Maudlin on 04/17/2022 09:15:17 -------------------------------------------------------------------------------- Physician Orders Details Patient Name: Date of Service: [name] Locket, [name] [name] 04/17/2022 8:15 A M Medical Record Number: [id number] Patient Account Number: [id number] Date of Birth/Sex: Treating RN: [birth year] (76 y.o. [name] Other Clinician: Referring Provider: Treating Provider/Extender: [name] in Treatment: 6 Verbal / Phone Orders: No Diagnosis Coding ICD-10 Coding Code Description L89.313 Pressure ulcer of right buttock, stage 3 E11.622 Type 2 diabetes mellitus with other skin ulcer E43 Unspecified severe protein-calorie malnutrition D47.3 Essential (hemorrhagic) thrombocythemia Follow-up Appointments ppointment in 1 week. - Dr. Celine Ahr - Room 1 Return A Tuesday 6/20 @ 11:15 am with [facility] Shower/ Hygiene May shower and wash wound with soap and water. Off-Loading Other: - stand at least hourly during the day Additional Orders / Instructions Follow Nutritious Diet - add protein supplements to diet Wound Treatment Wound #1 - Gluteus Wound Laterality: Right Peri-Wound Care: Skin Prep Every Other Day/30 Days Discharge Instructions: Use skin prep as directed Prim Dressing: Promogran Prisma Matrix, 4.34 (sq in) (silver collagen) Every Other Day/30 Days ary Discharge Instructions: Moisten collagen with saline or hydrogel Secondary Dressing: Zetuvit Plus Silicone Border Dressing 4x4 (in/in) Every Other Day/30 Days Discharge Instructions: Apply silicone border over primary dressing as directed. Electronic Signature(s) Signed: 04/17/2022 10:07:19 AM By: Fredirick Maudlin MD FACS Entered By: Fredirick Maudlin on 04/17/2022  09:15:25 -------------------------------------------------------------------------------- Problem List Details Patient Name: Date of Service: [name] Locket, [name] [name] 04/17/2022 8:15 A M Medical Record Number: [id number] Patient Account Number: [id number] Date of Birth/Sex: Treating RN: [birth year] (76 y.o. Nancy Fetter Primary Care Provider: [name] Other Clinician: Referring Provider: Treating Provider/Extender: [name] in Treatment: 6 Active Problems ICD-10 Encounter Code Description Active Date MDM Diagnosis L89.313 Pressure ulcer of right buttock, stage 3 03/05/2022 No Yes E11.622 Type 2 diabetes mellitus with other skin ulcer 03/05/2022 No Yes E43 Unspecified severe protein-calorie malnutrition 03/05/2022 No Yes D47.3 Essential (hemorrhagic)  thrombocythemia 03/05/2022 No Yes Inactive Problems Resolved Problems Electronic Signature(s) Signed: 04/17/2022 9:13:56 AM By: Fredirick Maudlin MD FACS Entered By: Fredirick Maudlin on 04/17/2022 09:13:56 -------------------------------------------------------------------------------- Progress Note Details Patient Name: Date of Service: [name] Locket, [name] [name] 04/17/2022 8:15 A M Medical Record Number: [id number] Patient Account Number: [id number] Date of Birth/Sex: Treating RN: 1946/05/21 (76 y.o. [name] Other Clinician: Referring Provider: Treating Provider/Extender: [name] in Treatment: 6 Subjective Chief Complaint Information obtained from Patient Patient is at the clinic for treatment of an open pressure ulcer History of Present Illness (HPI) ADMISSION 03/05/2022 This is a 76 year old woman with a past medical history notable for type 2 diabetes mellitus, hypertension, coronary artery disease, and essential thrombocythemia. Her husband died fairly recently and in that time she has lost a substantial amount of weight. She is  accompanied by one of her daughters who is a Marine scientist. Her other daughter is also a Marine scientist and lives next door. On February 05, 2022, the patient notified her daughters that she had a sore on her right buttock. Apparently 2 weeks prior to that, she fell in the yard and landed on her backside, but landed on grass and did not seem to sustain any obvious trauma. Since reporting the wound to her daughters, they have used some Santyl that they had leftover and have been using Medihoney since they ran out of that. She does not take insulin and her last hemoglobin A1c was reported to be 5.7. She is on hydroxyurea for essential thrombocythemia. On her right gluteus, there is a circular wound with thick fibrinous slough. There is some visible pink tissue underneath the slough at the periphery of the wound. No odor or significant drainage. 03/11/2022: The wound measured slightly larger today. It is a bit cleaner with less slough accumulation. There are buds of granulation tissue beginning to emerge. No concern for infection. 03/19/2022: The wound is a little bit smaller today. It still has about 50% surface covered with slough. There is granulation tissue continuing to emerge. 03/25/2022: The wound is smaller today. There is granulation tissue emerging through a thin layer of slough. The patient states that she is "gagging down" the protein supplements. 04/02/2022: Her wound is smaller, more superficial, and cleaner today. Her daughter has been monitoring her protein intake and the patient has been managing to get several doses of Pro-Stat daily. 04/11/2022: The wound continues to contract. The surface is much cleaner today. She is doing well with her protein intake due to her daughter's insistence. 04/17/2022: The wound is smaller again today. There is a light layer of slough on the surface. Patient History Information obtained from Patient, Chart. Family History Heart Disease - Child,Father, Hypertension - Mother, No  family history of Cancer, Diabetes, Hereditary Spherocytosis, Kidney Disease, Lung Disease, Seizures, Stroke, Thyroid Problems, Tuberculosis. Social History Never smoker, Marital Status - Widowed, Alcohol Use - Rarely, Drug Use - No History, Caffeine Use - Rarely. Medical History Eyes Patient has history of Cataracts - bil removed Denies history of Glaucoma, Optic Neuritis Hematologic/Lymphatic Patient has history of Anemia Denies history of Hemophilia, Human Immunodeficiency Virus, Lymphedema, Sickle Cell Disease Cardiovascular Patient has history of Coronary Artery Disease, Deep Vein Thrombosis - leg, Hypertension, Myocardial Infarction Endocrine Patient has history of Type II Diabetes Denies history of Type I Diabetes Genitourinary Denies history of End Stage Renal Disease Integumentary (Skin) Denies history of History of Burn Neurologic Patient has history of Seizure Disorder - focal  sz Oncologic Denies history of Received Chemotherapy, Received Radiation Psychiatric Patient has history of Anorexia/bulimia - poor appetite Denies history of Confinement Anxiety Hospitalization/Surgery History - ORIF right radius 2019. - left hand IandD. - coronary angioplasty with stent 2004. - c-section. - bil cataract ectraction. Medical A Surgical History Notes nd Cardiovascular hyperlipidemia Objective Constitutional No acute distress.. Vitals Time Taken: 8:58 AM, Height: 62 in, Weight: 114 lbs, BMI: 20.8, Temperature: 98.3 F, Pulse: 67 bpm, Respiratory Rate: 16 breaths/min, Blood Pressure: 125/61 mmHg, Capillary Blood Glucose: 121 mg/dl. General Notes: glucose per pt report this am Respiratory Normal work of breathing on room air.. General Notes: 04/17/2022: The wound is smaller again today. There is a light layer of slough on the surface. Under the slough, there is nice-looking granulation tissue. Integumentary (Hair, Skin) Wound #1 status is Open. Original cause of wound was  Pressure Injury. The date acquired was: 02/05/2022. The wound has been in treatment 6 weeks. The wound is located on the Right Gluteus. The wound measures 0.5cm length x 0.4cm width x 0.1cm depth; 0.157cm^2 area and 0.016cm^3 volume. There is Fat Layer (Subcutaneous Tissue) exposed. There is no tunneling or undermining noted. There is a small amount of serous drainage noted. The wound margin is distinct with the outline attached to the wound base. There is large (67-100%) pink granulation within the wound bed. There is a small (1-33%) amount of necrotic tissue within the wound bed including Adherent Slough. Assessment Active Problems ICD-10 Pressure ulcer of right buttock, stage 3 Type 2 diabetes mellitus with other skin ulcer Unspecified severe protein-calorie malnutrition Essential (hemorrhagic) thrombocythemia Procedures Wound #1 Pre-procedure diagnosis of Wound #1 is a Pressure Ulcer located on the Right Gluteus . There was a Selective/Open Wound Non-Viable Tissue Debridement with a total area of 0.2 sq cm performed by Fredirick Maudlin, MD. With the following instrument(s): Curette to remove Non-Viable tissue/material. Material removed includes [facility] after achieving pain control using Lidocaine 4% Topical Solution. No specimens were taken. A time out was conducted at 09:05, prior to the start of the procedure. A Minimum amount of bleeding was controlled with Pressure. The procedure was tolerated well with a pain level of 1 throughout and a pain level of 0 following the procedure. Post Debridement Measurements: 0.5cm length x 0.4cm width x 0.1cm depth; 0.016cm^3 volume. Post debridement Stage noted as Category/Stage III. Character of Wound/Ulcer Post Debridement is improved. Post procedure Diagnosis Wound #1: Same as Pre-Procedure Plan Follow-up Appointments: Return Appointment in 1 week. - Dr. Celine Ahr - Room 1 Tuesday 6/20 @ 11:15 am with [facility] Shower/ Hygiene: May shower and  wash wound with soap and water. Off-Loading: Other: - stand at least hourly during the day Additional Orders / Instructions: Follow Nutritious Diet - add protein supplements to diet WOUND #1: - Gluteus Wound Laterality: Right Peri-Wound Care: Skin Prep Every Other Day/30 Days Discharge Instructions: Use skin prep as directed Prim Dressing: Promogran Prisma Matrix, 4.34 (sq in) (silver collagen) Every Other Day/30 Days ary Discharge Instructions: Moisten collagen with saline or hydrogel Secondary Dressing: Zetuvit Plus Silicone Border Dressing 4x4 (in/in) Every Other Day/30 Days Discharge Instructions: Apply silicone border over primary dressing as directed. 04/17/2022: The wound is smaller again today. There is a light layer of slough on the surface. Under the slough, there is nice-looking granulation tissue. I used a curette to debride the slough from the wound. We will continue using the Prisma silver collagen. She will continue her offloading efforts and oral protein intake. Follow up  in 1 week. Electronic Signature(s) Signed: 04/17/2022 9:15:50 AM By: Fredirick Maudlin MD FACS Entered By: Fredirick Maudlin on 04/17/2022 09:15:50 -------------------------------------------------------------------------------- HxROS Details Patient Name: Date of Service: [name] Locket, [name] [name] 04/17/2022 8:15 A M Medical Record Number: [id number] Patient Account Number: [id number] Date of Birth/Sex: Treating RN: Jul 10, 1946 (76 y.o. [name] Other Clinician: Referring Provider: Treating Provider/Extender: [name] in Treatment: 6 Information Obtained From Patient Chart Eyes Medical History: Positive for: Cataracts - bil removed Negative for: Glaucoma; Optic Neuritis Hematologic/Lymphatic Medical History: Positive for: Anemia Negative for: Hemophilia; Human Immunodeficiency Virus; Lymphedema; Sickle Cell  Disease Cardiovascular Medical History: Positive for: Coronary Artery Disease; Deep Vein Thrombosis - leg; Hypertension; Myocardial Infarction Past Medical History Notes: hyperlipidemia Endocrine Medical History: Positive for: Type II Diabetes Negative for: Type I Diabetes Time with diabetes: age 36 Treated with: Oral agents Blood sugar tested every day: Yes Tested : once Genitourinary Medical History: Negative for: End Stage Renal Disease Integumentary (Skin) Medical History: Negative for: History of Burn Neurologic Medical History: Positive for: Seizure Disorder - focal sz Oncologic Medical History: Negative for: Received Chemotherapy; Received Radiation Psychiatric Medical History: Positive for: Anorexia/bulimia - poor appetite Negative for: Confinement Anxiety HBO Extended History Items Eyes: Cataracts Immunizations Pneumococcal Vaccine: Received Pneumococcal Vaccination: Yes Received Pneumococcal Vaccination On or After 60th Birthday: Yes Implantable Devices No devices added Hospitalization / Surgery History Type of Hospitalization/Surgery ORIF right radius 2019 left hand IandD coronary angioplasty with stent 2004 c-section bil cataract ectraction Family and Social History Cancer: No; Diabetes: No; Heart Disease: Yes - Child,Father; Hereditary Spherocytosis: No; Hypertension: Yes - Mother; Kidney Disease: No; Lung Disease: No; Seizures: No; Stroke: No; Thyroid Problems: No; Tuberculosis: No; Never smoker; Marital Status - Widowed; Alcohol Use: Rarely; Drug Use: No History; Caffeine Use: Rarely; Financial Concerns: No; Food, Clothing or Shelter Needs: No; Support System Lacking: No; Transportation Concerns: No Electronic Signature(s) Signed: 04/17/2022 10:07:19 AM By: Fredirick Maudlin MD FACS Signed: 04/17/2022 6:00:36 PM By: Baruch Gouty RN, BSN Entered By: Fredirick Maudlin on 04/17/2022  09:14:36 -------------------------------------------------------------------------------- SuperBill Details Patient Name: Date of Service: [name] Locket, [name] [name] 04/17/2022 Medical Record Number: [id number] Patient Account Number: [id number] Date of Birth/Sex: Treating RN: 09-Nov-1945 (76 y.o. [name] Other Clinician: Referring Provider: Treating Provider/Extender: [name] in Treatment: 6 Diagnosis Coding ICD-10 Codes Code Description [phone] Pressure ulcer of right buttock, stage 3 E11.622 Type 2 diabetes mellitus with other skin ulcer E43 Unspecified severe protein-calorie malnutrition D47.3 Essential (hemorrhagic) thrombocythemia Facility Procedures CPT4 Code: [id number] Description: [phone] - DEBRIDE WOUND 1ST 20 SQ CM OR < ICD-10 Diagnosis Description L89.313 Pressure ulcer of right buttock, stage 3 Modifier: Quantity: 1 Physician Procedures : CPT4 Code Description Modifier [id number] - WC PHYS LEVEL 3 - EST PT 25 ICD-10 Diagnosis Description L89.313 Pressure ulcer of right buttock, stage 3 E11.622 Type 2 diabetes mellitus with other skin ulcer E43 Unspecified severe protein-calorie  malnutrition Quantity: 1 : [id number] [id number] - WC PHYS DEBR WO ANESTH 20 SQ CM ICD-10 Diagnosis Description L89.313 Pressure ulcer of right buttock, stage 3 Quantity: 1 Electronic Signature(s) Signed: 04/17/2022 9:16:40 AM By: Fredirick Maudlin MD FACS Entered By: Fredirick Maudlin on 04/17/2022 09:16:39

## 2022-04-21 DIAGNOSIS — Z7901 Long term (current) use of anticoagulants: Secondary | ICD-10-CM | POA: Diagnosis not present

## 2022-04-21 DIAGNOSIS — Z7189 Other specified counseling: Secondary | ICD-10-CM | POA: Diagnosis not present

## 2022-04-21 DIAGNOSIS — L89153 Pressure ulcer of sacral region, stage 3: Secondary | ICD-10-CM | POA: Diagnosis not present

## 2022-04-21 DIAGNOSIS — Z86718 Personal history of other venous thrombosis and embolism: Secondary | ICD-10-CM | POA: Diagnosis not present

## 2022-04-22 ENCOUNTER — Telehealth: Payer: Self-pay | Admitting: Internal Medicine

## 2022-04-22 ENCOUNTER — Encounter (HOSPITAL_BASED_OUTPATIENT_CLINIC_OR_DEPARTMENT_OTHER): Payer: Medicare Other | Admitting: General Surgery

## 2022-04-22 DIAGNOSIS — L89313 Pressure ulcer of right buttock, stage 3: Secondary | ICD-10-CM | POA: Diagnosis not present

## 2022-04-22 DIAGNOSIS — E11622 Type 2 diabetes mellitus with other skin ulcer: Secondary | ICD-10-CM | POA: Diagnosis not present

## 2022-04-22 DIAGNOSIS — E43 Unspecified severe protein-calorie malnutrition: Secondary | ICD-10-CM | POA: Diagnosis not present

## 2022-04-22 DIAGNOSIS — D473 Essential (hemorrhagic) thrombocythemia: Secondary | ICD-10-CM | POA: Diagnosis not present

## 2022-04-22 DIAGNOSIS — L98412 Non-pressure chronic ulcer of buttock with fat layer exposed: Secondary | ICD-10-CM | POA: Diagnosis not present

## 2022-04-22 NOTE — Telephone Encounter (Signed)
Scheduled per 06/14 los, patient has been called and notified.  

## 2022-04-23 NOTE — Progress Notes (Signed)
[name], [name] A. ([id number]) Visit Report for 04/22/2022 Chief Complaint Document Details Patient Name: Date of Service: [name] [name], [place] A. 04/22/2022 11:15 A M Medical Record Number: [id number] Patient Account Number: [id number] Date of Birth/Sex: Treating RN: 08-21-1946 (76 y.o. [name] [name] Primary Care Provider: Shon [name] Other Clinician: Referring Provider: Treating Provider/Extender: [name] [name] in Treatment: 6 Information Obtained from: Patient Chief Complaint Patient is at the clinic for treatment of an open pressure ulcer Electronic Signature(s) Signed: 04/22/2022 11:48:23 AM By: [name] Maudlin MD FACS Entered By: [name] [name] on 04/22/2022 11:48:23 -------------------------------------------------------------------------------- Debridement Details Patient Name: Date of Service: [name] Locket, PA [name] A. 04/22/2022 11:15 A M Medical Record Number: [id number] Patient Account Number: [id number] Date of Birth/Sex: Treating RN: 03-14-1946 (76 y.o. [name] [name] Primary Care Provider: Shon [name] Other Clinician: Referring Provider: Treating Provider/Extender: [name] [name] in Treatment: 6 Debridement Performed for Assessment: Wound #1 Right Gluteus Performed By: Physician [name] Maudlin, MD Debridement Type: Debridement Level of Consciousness (Pre-procedure): Awake and Alert Pre-procedure Verification/Time Out Yes - 11:30 Taken: Start Time: 11:30 Pain Control: Other : benzocaine 20% spray T Area Debrided (L x W): otal 0.4 (cm) x 0.2 (cm) = 0.08 (cm) Tissue and other material debrided: Non-Viable, Slough, Slough Level: Non-Viable Tissue Debridement Description: Selective/Open Wound Instrument: Curette Bleeding: Minimum Hemostasis Achieved: Pressure Procedural Pain: 0 Post Procedural Pain: 0 Response to Treatment: Procedure was tolerated well Level of Consciousness (Post- Awake and  Alert procedure): Post Debridement Measurements of Total Wound Length: (cm) 0.4 Stage: Category/Stage III Width: (cm) 0.2 Depth: (cm) 0.1 Volume: (cm) 0.006 Character of Wound/Ulcer Post Debridement: Improved Post Procedure Diagnosis Same as Pre-procedure Electronic Signature(s) Signed: 04/22/2022 12:03:02 PM By: [name] Maudlin MD FACS Signed: 04/22/2022 4:52:42 PM By: [name] [name] Entered By: [name] [name] on 04/22/2022 11:31:00 -------------------------------------------------------------------------------- HPI Details Patient Name: Date of Service: [name] Locket, PA [name] A. 04/22/2022 11:15 A M Medical Record Number: [id number] Patient Account Number: [id number] Date of Birth/Sex: Treating RN: April 30, 1946 (76 y.o. [name] [name] Primary Care Provider: Shon [name] Other Clinician: Referring Provider: Treating Provider/Extender: [name] [name] in Treatment: 6 History of Present Illness HPI Description: ADMISSION 03/05/2022 This is a 76 year old woman with a past medical history notable for type 2 diabetes mellitus, hypertension, coronary artery disease, and essential thrombocythemia. Her husband died fairly recently and in that time she has lost a substantial amount of weight. She is accompanied by one of her daughters who is a Marine scientist. Her other daughter is also a Marine scientist and lives next door. On February 05, 2022, the patient notified her daughters that she had a sore on her right buttock. Apparently 2 weeks prior to that, she fell in the yard and landed on her backside, but landed on grass and did not seem to sustain any obvious trauma. Since reporting the wound to her daughters, they have used some Santyl that they had leftover and have been using Medihoney since they ran out of that. She does not take insulin and her last hemoglobin A1c was reported to be 5.7. She is on hydroxyurea for essential thrombocythemia. On her right gluteus, there is a circular wound  with thick fibrinous slough. There is some visible pink tissue underneath the slough at the periphery of the wound. No odor or significant drainage. 03/11/2022: The wound measured slightly larger today. It is a bit cleaner with less slough accumulation. There are buds of granulation tissue beginning to emerge. No concern for infection. 03/19/2022:  The wound is a little bit smaller today. It still has about 50% surface covered with slough. There is granulation tissue continuing to emerge. 03/25/2022: The wound is smaller today. There is granulation tissue emerging through a thin layer of slough. The patient states that she is "gagging down" the protein supplements. 04/02/2022: Her wound is smaller, more superficial, and cleaner today. Her daughter has been monitoring her protein intake and the patient has been managing to get several doses of Pro-Stat daily. 04/11/2022: The wound continues to contract. The surface is much cleaner today. She is doing well with her protein intake due to her daughter's insistence. 04/17/2022: The wound is smaller again today. There is a light layer of slough on the surface. 04/22/2022: The wound is smaller yet again. She continues to accumulate a light layer of slough. No concern for infection. Electronic Signature(s) Signed: 04/22/2022 11:49:00 AM By: [name] Maudlin MD FACS Entered By: [name] [name] on 04/22/2022 11:49:00 -------------------------------------------------------------------------------- Physical Exam Details Patient Name: Date of Service: [name] [name], [name] [name] A. 04/22/2022 11:15 A M Medical Record Number: [id number] Patient Account Number: [id number] Date of Birth/Sex: Treating RN: 04-26-1946 (76 y.o. [name] [name] Primary Care Provider: Shon [name] Other Clinician: Referring Provider: Treating Provider/Extender: [name] [name] in Treatment: 6 Constitutional . . . . No acute distress.Marland Kitchen Respiratory Normal work of breathing on  room air.. Notes 04/22/2022: The wound is smaller yet again. She continues to accumulate a light layer of slough. No concern for infection. Electronic Signature(s) Signed: 04/22/2022 11:50:09 AM By: [name] Maudlin MD FACS Entered By: [name] [name] on 04/22/2022 11:50:08 -------------------------------------------------------------------------------- Physician Orders Details Patient Name: Date of Service: [name] Locket, PA [name] A. 04/22/2022 11:15 A M Medical Record Number: [id number] Patient Account Number: [id number] Date of Birth/Sex: Treating RN: 09-22-1946 (76 y.o. [name] [name] Primary Care Provider: Shon [name] Other Clinician: Referring Provider: Treating Provider/Extender: [name] [name] in Treatment: 6 Verbal / Phone Orders: No Diagnosis Coding ICD-10 Coding Code Description L89.313 Pressure ulcer of right buttock, stage 3 E11.622 Type 2 diabetes mellitus with other skin ulcer E43 Unspecified severe protein-calorie malnutrition D47.3 Essential (hemorrhagic) thrombocythemia Follow-up Appointments ppointment in 1 week. - Dr. Celine Ahr - Room 1 Return A Tuesday 6/29 @ 12:30 pm with [facility] Shower/ Hygiene May shower and wash wound with soap and water. Off-Loading Other: - stand at least hourly during the day Additional Orders / Instructions Follow Nutritious Diet - add protein supplements to diet Wound Treatment Wound #1 - Gluteus Wound Laterality: Right Peri-Wound Care: Skin Prep Every Other Day/30 Days Discharge Instructions: Use skin prep as directed Prim Dressing: Promogran Prisma Matrix, 4.34 (sq in) (silver collagen) Every Other Day/30 Days ary Discharge Instructions: Moisten collagen with saline or hydrogel Secondary Dressing: Zetuvit Plus Silicone Border Dressing 4x4 (in/in) Every Other Day/30 Days Discharge Instructions: Apply silicone border over primary dressing as directed. Electronic Signature(s) Signed: 04/22/2022 12:03:02 PM  By: [name] Maudlin MD FACS Entered By: [name] [name] on 04/22/2022 11:51:00 -------------------------------------------------------------------------------- Problem List Details Patient Name: Date of Service: [name] Locket, PA [name] A. 04/22/2022 11:15 A M Medical Record Number: [id number] Patient Account Number: [id number] Date of Birth/Sex: Treating RN: 01/12/1946 (76 y.o. [name] [name] Primary Care Provider: Shon [name] Other Clinician: Referring Provider: Treating Provider/Extender: [name] [name] in Treatment: 6 Active Problems ICD-10 Encounter Code Description Active Date MDM Diagnosis L89.313 Pressure ulcer of right buttock, stage 3 03/05/2022 No Yes E11.622 Type 2 diabetes mellitus with other skin ulcer 03/05/2022  No Yes E43 Unspecified severe protein-calorie malnutrition 03/05/2022 No Yes D47.3 Essential (hemorrhagic) thrombocythemia 03/05/2022 No Yes Inactive Problems Resolved Problems Electronic Signature(s) Signed: 04/22/2022 11:45:29 AM By: [name] Maudlin MD FACS Entered By: [name] [name] on 04/22/2022 11:45:29 -------------------------------------------------------------------------------- Progress Note Details Patient Name: Date of Service: [name] Locket, PA [name] A. 04/22/2022 11:15 A M Medical Record Number: [id number] Patient Account Number: [id number] Date of Birth/Sex: Treating RN: [birth year] (76 y.o. [name] [name] Primary Care Provider: Shon [name] Other Clinician: Referring Provider: Treating Provider/Extender: [name] [name] in Treatment: 6 Subjective Chief Complaint Information obtained from Patient Patient is at the clinic for treatment of an open pressure ulcer History of Present Illness (HPI) ADMISSION 03/05/2022 This is a 76 year old woman with a past medical history notable for type 2 diabetes mellitus, hypertension, coronary artery disease, and essential thrombocythemia. Her husband died fairly recently  and in that time she has lost a substantial amount of weight. She is accompanied by one of her daughters who is a Marine scientist. Her other daughter is also a Marine scientist and lives next door. On February 05, 2022, the patient notified her daughters that she had a sore on her right buttock. Apparently 2 weeks prior to that, she fell in the yard and landed on her backside, but landed on grass and did not seem to sustain any obvious trauma. Since reporting the wound to her daughters, they have used some Santyl that they had leftover and have been using Medihoney since they ran out of that. She does not take insulin and her last hemoglobin A1c was reported to be 5.7. She is on hydroxyurea for essential thrombocythemia. On her right gluteus, there is a circular wound with thick fibrinous slough. There is some visible pink tissue underneath the slough at the periphery of the wound. No odor or significant drainage. 03/11/2022: The wound measured slightly larger today. It is a bit cleaner with less slough accumulation. There are buds of granulation tissue beginning to emerge. No concern for infection. 03/19/2022: The wound is a little bit smaller today. It still has about 50% surface covered with slough. There is granulation tissue continuing to emerge. 03/25/2022: The wound is smaller today. There is granulation tissue emerging through a thin layer of slough. The patient states that she is "gagging down" the protein supplements. 04/02/2022: Her wound is smaller, more superficial, and cleaner today. Her daughter has been monitoring her protein intake and the patient has been managing to get several doses of Pro-Stat daily. 04/11/2022: The wound continues to contract. The surface is much cleaner today. She is doing well with her protein intake due to her daughter's insistence. 04/17/2022: The wound is smaller again today. There is a light layer of slough on the surface. 04/22/2022: The wound is smaller yet again. She continues to  accumulate a light layer of slough. No concern for infection. Patient History Information obtained from Patient, Chart. Family History Heart Disease - Child,Father, Hypertension - Mother, No family history of Cancer, Diabetes, Hereditary Spherocytosis, Kidney Disease, Lung Disease, Seizures, Stroke, Thyroid Problems, Tuberculosis. Social History Never smoker, Marital Status - Widowed, Alcohol Use - Rarely, Drug Use - No History, Caffeine Use - Rarely. Medical History Eyes Patient has history of Cataracts - bil removed Denies history of Glaucoma, Optic Neuritis Hematologic/Lymphatic Patient has history of Anemia Denies history of Hemophilia, Human Immunodeficiency Virus, Lymphedema, Sickle Cell Disease Cardiovascular Patient has history of Coronary Artery Disease, Deep Vein Thrombosis - leg, Hypertension, Myocardial Infarction Endocrine Patient has history of Type  II Diabetes Denies history of Type I Diabetes Genitourinary Denies history of End Stage Renal Disease Integumentary (Skin) Denies history of History of Burn Neurologic Patient has history of Seizure Disorder - focal sz Oncologic Denies history of Received Chemotherapy, Received Radiation Psychiatric Patient has history of Anorexia/bulimia - poor appetite Denies history of Confinement Anxiety Hospitalization/Surgery History - ORIF right radius 2019. - left hand IandD. - coronary angioplasty with stent 2004. - c-section. - bil cataract ectraction. Medical A Surgical History Notes nd Cardiovascular hyperlipidemia Objective Constitutional No acute distress.. Vitals Time Taken: 11:22 AM, Height: 62 in, Weight: 114 lbs, BMI: 20.8, Temperature: 98.5 F, Pulse: 64 bpm, Respiratory Rate: 16 breaths/min, Blood Pressure: 128/63 mmHg. Respiratory Normal work of breathing on room air.. General Notes: 04/22/2022: The wound is smaller yet again. She continues to accumulate a light layer of slough. No concern for  infection. Integumentary (Hair, Skin) Wound #1 status is Open. Original cause of wound was Pressure Injury. The date acquired was: 02/05/2022. The wound has been in treatment 6 weeks. The wound is located on the Right Gluteus. The wound measures 0.4cm length x 0.2cm width x 0.1cm depth; 0.063cm^2 area and 0.006cm^3 volume. There is Fat Layer (Subcutaneous Tissue) exposed. There is no tunneling or undermining noted. There is a small amount of serous drainage noted. The wound margin is distinct with the outline attached to the wound base. There is small (1-33%) pink granulation within the wound bed. There is a large (67-100%) amount of necrotic tissue within the wound bed including Adherent Slough. Assessment Active Problems ICD-10 Pressure ulcer of right buttock, stage 3 Type 2 diabetes mellitus with other skin ulcer Unspecified severe protein-calorie malnutrition Essential (hemorrhagic) thrombocythemia Procedures Wound #1 Pre-procedure diagnosis of Wound #1 is a Pressure Ulcer located on the Right Gluteus . There was a Selective/Open Wound Non-Viable Tissue Debridement with a total area of 0.08 sq cm performed by [name] Maudlin, MD. With the following instrument(s): Curette to remove Non-Viable tissue/material. Material removed includes [facility] after achieving pain control using Other (benzocaine 20% spray). No specimens were taken. A time out was conducted at 11:30, prior to the start of the procedure. A Minimum amount of bleeding was controlled with Pressure. The procedure was tolerated well with a pain level of 0 throughout and a pain level of 0 following the procedure. Post Debridement Measurements: 0.4cm length x 0.2cm width x 0.1cm depth; 0.006cm^3 volume. Post debridement Stage noted as Category/Stage III. Character of Wound/Ulcer Post Debridement is improved. Post procedure Diagnosis Wound #1: Same as Pre-Procedure Plan Follow-up Appointments: Return Appointment in 1 week. - Dr.  Celine Ahr - Room 1 Tuesday 6/29 @ 12:30 pm with [facility] Shower/ Hygiene: May shower and wash wound with soap and water. Off-Loading: Other: - stand at least hourly during the day Additional Orders / Instructions: Follow Nutritious Diet - add protein supplements to diet WOUND #1: - Gluteus Wound Laterality: Right Peri-Wound Care: Skin Prep Every Other Day/30 Days Discharge Instructions: Use skin prep as directed Prim Dressing: Promogran Prisma Matrix, 4.34 (sq in) (silver collagen) Every Other Day/30 Days ary Discharge Instructions: Moisten collagen with saline or hydrogel Secondary Dressing: Zetuvit Plus Silicone Border Dressing 4x4 (in/in) Every Other Day/30 Days Discharge Instructions: Apply silicone border over primary dressing as directed. 04/22/2022: The wound is smaller yet again. She continues to accumulate a light layer of slough. No concern for infection. I used a curette to debride the slough from the wound surface. We will continue using Prisma silver collagen. Hopefully she  will close in the next week or 2. Follow-up in 1 week. Electronic Signature(s) Signed: 04/22/2022 11:51:34 AM By: [name] Maudlin MD FACS Entered By: [name] [name] on 04/22/2022 11:51:33 -------------------------------------------------------------------------------- HxROS Details Patient Name: Date of Service: [name] Locket, PA [name] A. 04/22/2022 11:15 A M Medical Record Number: [id number] Patient Account Number: [id number] Date of Birth/Sex: Treating RN: [birth year] (76 y.o. [name] [name] Primary Care Provider: Shon [name] Other Clinician: Referring Provider: Treating Provider/Extender: [name] [name] in Treatment: 6 Information Obtained From Patient Chart Eyes Medical History: Positive for: Cataracts - bil removed Negative for: Glaucoma; Optic Neuritis Hematologic/Lymphatic Medical History: Positive for: Anemia Negative for: Hemophilia; Human Immunodeficiency Virus;  Lymphedema; Sickle Cell Disease Cardiovascular Medical History: Positive for: Coronary Artery Disease; Deep Vein Thrombosis - leg; Hypertension; Myocardial Infarction Past Medical History Notes: hyperlipidemia Endocrine Medical History: Positive for: Type II Diabetes Negative for: Type I Diabetes Time with diabetes: age 5 Treated with: Oral agents Blood sugar tested every day: Yes Tested : once Genitourinary Medical History: Negative for: End Stage Renal Disease Integumentary (Skin) Medical History: Negative for: History of Burn Neurologic Medical History: Positive for: Seizure Disorder - focal sz Oncologic Medical History: Negative for: Received Chemotherapy; Received Radiation Psychiatric Medical History: Positive for: Anorexia/bulimia - poor appetite Negative for: Confinement Anxiety HBO Extended History Items Eyes: Cataracts Immunizations Pneumococcal Vaccine: Received Pneumococcal Vaccination: Yes Received Pneumococcal Vaccination On or After 60th Birthday: Yes Implantable Devices No devices added Hospitalization / Surgery History Type of Hospitalization/Surgery ORIF right radius 2019 left hand IandD coronary angioplasty with stent 2004 c-section bil cataract ectraction Family and Social History Cancer: No; Diabetes: No; Heart Disease: Yes - Child,Father; Hereditary Spherocytosis: No; Hypertension: Yes - Mother; Kidney Disease: No; Lung Disease: No; Seizures: No; Stroke: No; Thyroid Problems: No; Tuberculosis: No; Never smoker; Marital Status - Widowed; Alcohol Use: Rarely; Drug Use: No History; Caffeine Use: Rarely; Financial Concerns: No; Food, Clothing or Shelter Needs: No; Support System Lacking: No; Transportation Concerns: No Engineer, maintenance) Signed: 04/22/2022 12:03:02 PM By: [name] Maudlin MD FACS Signed: 04/23/2022 6:21:16 PM By: Baruch Gouty RN, BSN Entered By: [name] [name] on 04/22/2022  11:49:46 -------------------------------------------------------------------------------- [place] Details Patient Name: Date of Service: [name] Locket, PA [name] A. 04/22/2022 Medical Record Number: [id number] Patient Account Number: [id number] Date of Birth/Sex: Treating RN: 09-Jul-1946 (76 y.o. [name] [name], [name] Primary Care Provider: Shon [name] Other Clinician: Referring Provider: Treating Provider/Extender: [name] [name] in Treatment: 6 Diagnosis Coding ICD-10 Codes Code Description [phone] Pressure ulcer of right buttock, stage 3 E11.622 Type 2 diabetes mellitus with other skin ulcer E43 Unspecified severe protein-calorie malnutrition D47.3 Essential (hemorrhagic) thrombocythemia Facility Procedures CPT4 Code: [id number] Description: [phone] - DEBRIDE WOUND 1ST 20 SQ CM OR < ICD-10 Diagnosis Description L89.313 Pressure ulcer of right buttock, stage 3 Modifier: Quantity: 1 Physician Procedures : CPT4 Code Description Modifier [id number] - WC PHYS LEVEL 3 - EST PT 25 ICD-10 Diagnosis Description L89.313 Pressure ulcer of right buttock, stage 3 E11.622 Type 2 diabetes mellitus with other skin ulcer E43 Unspecified severe protein-calorie  malnutrition D47.3 Essential (hemorrhagic) thrombocythemia Quantity: 1 : [phone] [id number] - WC PHYS DEBR WO ANESTH 20 SQ CM ICD-10 Diagnosis Description L89.313 Pressure ulcer of right buttock, stage 3 Quantity: 1 Electronic Signature(s) Signed: 04/22/2022 11:51:52 AM By: [name] Maudlin MD FACS Entered By: [name] [name] on 04/22/2022 11:51:51

## 2022-04-23 NOTE — Progress Notes (Signed)
[name], [name] A. ([id number]) Visit Report for 04/22/2022 Arrival Information Details Patient Name: Date of Service: [name] [name], [place] A. 04/22/2022 11:15 A M Medical Record Number: [id number] Patient Account Number: [id number] Date of Birth/Sex: Treating RN: 1946/03/03 (76 y.o. [name] Primary Care Kinzly Pierrelouis: [name] Other Clinician: Referring Macgregor Aeschliman: Treating Ram Haugan/Extender: [name] in Treatment: 6 Visit Information History Since Last Visit Added or deleted any medications: No Patient Arrived: Ambulatory Any new allergies or adverse reactions: No Arrival Time: 11:19 Had a fall or experienced change in No Accompanied By: family activities of daily living that may affect Transfer Assistance: None risk of falls: Patient Identification Verified: Yes Signs or symptoms of abuse/neglect since last visito No Secondary Verification Process Completed: Yes Hospitalized since last visit: No Patient Requires Transmission-Based Precautions: No Implantable device outside of the clinic excluding No Patient Has Alerts: Yes cellular tissue based products placed in the center Patient Alerts: Patient on Blood Thinner since last visit: Has Dressing in Place as Prescribed: Yes Pain Present Now: Yes Electronic Signature(s) Signed: 04/22/2022 4:52:42 PM By: Adline Peals Entered By: Adline Peals on 04/22/2022 11:22:02 -------------------------------------------------------------------------------- Encounter Discharge Information Details Patient Name: Date of Service: [name] Locket, [place] [name] A. 04/22/2022 11:15 A M Medical Record Number: [id number] Patient Account Number: [id number] Date of Birth/Sex: Treating RN: Jan 16, 1946 (76 y.o. [name] Primary Care Maille Halliwell: [name] Other Clinician: Referring Ericson Nafziger: Treating Maritsa Hunsucker/Extender: [name] in Treatment: 6 Encounter Discharge Information Items  Post Procedure Vitals Discharge Condition: Stable Temperature (F): 98.5 Ambulatory Status: Ambulatory Pulse (bpm): 64 Discharge Destination: Home Respiratory Rate (breaths/min): 18 Transportation: Private Auto Blood Pressure (mmHg): 128/63 Accompanied By: family Schedule Follow-up Appointment: Yes Clinical Summary of Care: Patient Declined Electronic Signature(s) Signed: 04/22/2022 4:52:42 PM By: Adline Peals Entered By: Adline Peals on 04/22/2022 11:44:10 -------------------------------------------------------------------------------- Lower Extremity Assessment Details Patient Name: Date of Service: [name] [name], [place] A. 04/22/2022 11:15 A M Medical Record Number: [id number] Patient Account Number: [id number] Date of Birth/Sex: Treating RN: July 02, 1946 (76 y.o. [name] Primary Care Lynita Groseclose: [name] Other Clinician: Referring Larenzo Caples: Treating Matt Delpizzo/Extender: [name] in Treatment: 6 Electronic Signature(s) Signed: 04/22/2022 4:52:42 PM By: Sabas Sous By: Adline Peals on 04/22/2022 11:22:36 -------------------------------------------------------------------------------- Multi Wound Chart Details Patient Name: Date of Service: [name] Locket, [place] [name] A. 04/22/2022 11:15 A M Medical Record Number: [id number] Patient Account Number: [id number] Date of Birth/Sex: Treating RN: [birth year] (76 y.o. [name] Primary Care Kale Rondeau: [name] Other Clinician: Referring Creasie Lacosse: Treating Mayrani Khamis/Extender: [name] in Treatment: 6 Vital Signs Height(in): 26 Pulse(bpm): 61 Weight(lbs): 114 Blood Pressure(mmHg): 128/63 Body Mass Index(BMI): 20.8 Temperature(F): 98.5 Respiratory Rate(breaths/min): 16 Photos: [N/A:N/A] Right Gluteus N/A N/A Wound Location: Pressure Injury N/A N/A Wounding Event: Pressure Ulcer N/A N/A Primary Etiology: Cataracts, Anemia, Coronary  Artery N/A N/A Comorbid History: Disease, Deep Vein Thrombosis, Hypertension, Myocardial Infarction, Type II Diabetes, Seizure Disorder, Anorexia/bulimia 02/05/2022 N/A N/A Date Acquired: 6 N/A N/A Weeks of Treatment: Open N/A N/A Wound Status: No N/A N/A Wound Recurrence: 0.4x0.2x0.1 N/A N/A Measurements L x W x D (cm) 0.063 N/A N/A A (cm) : rea 0.006 N/A N/A Volume (cm) : 96.90% N/A N/A % Reduction in A rea: 97.00% N/A N/A % Reduction in Volume: Category/Stage III N/A N/A Classification: Small N/A N/A Exudate A mount: Serous N/A N/A Exudate Type: amber N/A N/A Exudate Color: Distinct, outline attached N/A N/A Wound Margin: Small (1-33%) N/A N/A Granulation  Amount: Pink N/A N/A Granulation Quality: Large (67-100%) N/A N/A Necrotic Amount: Fat Layer (Subcutaneous Tissue): Yes N/A N/A Exposed Structures: Fascia: No Tendon: No Muscle: No Joint: No Bone: No Large (67-100%) N/A N/A Epithelialization: Debridement - Selective/Open Wound N/A N/A Debridement: Pre-procedure Verification/Time Out 11:30 N/A N/A Taken: Other N/A N/A Pain Control: Slough N/A N/A Tissue Debrided: Non-Viable Tissue N/A N/A Level: 0.08 N/A N/A Debridement A (sq cm): rea Curette N/A N/A Instrument: Minimum N/A N/A Bleeding: Pressure N/A N/A Hemostasis A chieved: 0 N/A N/A Procedural Pain: 0 N/A N/A Post Procedural Pain: Procedure was tolerated well N/A N/A Debridement Treatment Response: 0.4x0.2x0.1 N/A N/A Post Debridement Measurements L x W x D (cm) 0.006 N/A N/A Post Debridement Volume: (cm) Category/Stage III N/A N/A Post Debridement Stage: Debridement N/A N/A Procedures Performed: Treatment Notes Wound #1 (Gluteus) Wound Laterality: Right Cleanser Peri-Wound Care Skin Prep Discharge Instruction: Use skin prep as directed Topical Primary Dressing Promogran Prisma Matrix, 4.34 (sq in) (silver collagen) Discharge Instruction: Moisten collagen with saline  or hydrogel Secondary Dressing Zetuvit Plus Silicone Border Dressing 4x4 (in/in) Discharge Instruction: Apply silicone border over primary dressing as directed. Secured With Compression Wrap Compression Stockings Environmental education officer) Signed: 04/22/2022 11:46:39 AM By: Fredirick Maudlin MD FACS Signed: 04/23/2022 6:21:16 PM By: Baruch Gouty RN, BSN Entered By: Fredirick Maudlin on 04/22/2022 11:46:39 -------------------------------------------------------------------------------- Multi-Disciplinary Care Plan Details Patient Name: Date of Service: [name] [name], [name] [name] A. 04/22/2022 11:15 A M Medical Record Number: [id number] Patient Account Number: [id number] Date of Birth/Sex: Treating RN: [birth year] (76 y.o. [name] Primary Care Glade Strausser: [name] Other Clinician: Referring Jasmain Ahlberg: Treating Carriann Hesse/Extender: [name] in Treatment: 6 Multidisciplinary Care Plan reviewed with physician Active Inactive Abuse / Safety / Falls / Self Care Management Nursing Diagnoses: History of Falls Potential for falls Goals: Patient/caregiver will verbalize/demonstrate measures taken to prevent injury and/or falls Date Initiated: 03/05/2022 Target Resolution Date: 04/30/2022 Goal Status: Active Interventions: Assess fall risk on admission and as needed Assess impairment of mobility on admission and as needed per policy Notes: Nutrition Nursing Diagnoses: Imbalanced nutrition Impaired glucose control: actual or potential Potential for alteratiion in Nutrition/Potential for imbalanced nutrition Goals: Patient/caregiver will maintain therapeutic glucose control Date Initiated: 03/05/2022 Target Resolution Date: 04/30/2022 Goal Status: Active Interventions: Assess patient nutrition upon admission and as needed per policy Provide education on elevated blood sugars and impact on wound healing Provide education on nutrition Treatment  Activities: Dietary management education, guidance and counseling : 03/05/2022 Education provided on Nutrition : 04/02/2022 Giving encouragement to exercise : 03/05/2022 Patient referred to Primary Care Physician for further nutritional evaluation : 03/05/2022 Notes: Wound/Skin Impairment Nursing Diagnoses: Impaired tissue integrity Knowledge deficit related to ulceration/compromised skin integrity Goals: Patient/caregiver will verbalize understanding of skin care regimen Date Initiated: 03/05/2022 Target Resolution Date: 04/30/2022 Goal Status: Active Ulcer/skin breakdown will have a volume reduction of 30% by week 4 Date Initiated: 03/05/2022 Target Resolution Date: 04/30/2022 Goal Status: Active Interventions: Assess patient/caregiver ability to obtain necessary supplies Assess patient/caregiver ability to perform ulcer/skin care regimen upon admission and as needed Assess ulceration(s) every visit Provide education on ulcer and skin care Treatment Activities: Skin care regimen initiated : 03/05/2022 Topical wound management initiated : 03/05/2022 Notes: Electronic Signature(s) Signed: 04/22/2022 4:52:42 PM By: Adline Peals Entered By: Adline Peals on 04/22/2022 11:27:06 -------------------------------------------------------------------------------- Pain Assessment Details Patient Name: Date of Service: [name] [name], [place] A. 04/22/2022 11:15 A M Medical Record Number: [id number] Patient Account Number: [id number] Date of Birth/Sex:  Treating RN: 10/17/1946 (76 y.o. [name] Primary Care Antonieta Slaven: [name] Other Clinician: Referring Timofey Carandang: Treating Kaprice Kage/Extender: [name] in Treatment: 6 Active Problems Location of Pain Severity and Description of Pain Patient Has Paino Yes Site Locations Rate the pain. Current Pain Level: 3 Pain Management and Medication Current Pain Management: Electronic Signature(s) Signed: 04/22/2022  4:52:42 PM By: Adline Peals Entered By: Adline Peals on 04/22/2022 11:22:32 -------------------------------------------------------------------------------- Patient/Caregiver Education Details Patient Name: Date of Service: [name] Locket, [place] [name] Loni Muse 6/20/2023andnbsp11:15 [place] Record Number: [id number] Patient Account Number: [id number] Date of Birth/Gender: Treating RN: Mar 12, 1946 (76 y.o. [name] Primary Care Physician: [name] Other Clinician: Referring Physician: Treating Physician/Extender: [name] in Treatment: 6 Education Assessment Education Provided To: Patient Education Topics Provided Wound/Skin Impairment: Methods: Explain/Verbal Responses: Reinforcements needed, State content correctly Electronic Signature(s) Signed: 04/22/2022 4:52:42 PM By: Adline Peals Entered By: Adline Peals on 04/22/2022 11:27:16 -------------------------------------------------------------------------------- Wound Assessment Details Patient Name: Date of Service: [name] [name], [name] [name] A. 04/22/2022 11:15 A M Medical Record Number: [id number] Patient Account Number: [id number] Date of Birth/Sex: Treating RN: 12-29-1945 (76 y.o. [name] Primary Care Lourine Alberico: [name] Other Clinician: Referring Mariellen Blaney: Treating Renard Caperton/Extender: [name] in Treatment: 6 Wound Status Wound Number: 1 Primary Pressure Ulcer Etiology: Wound Location: Right Gluteus Wound Open Wounding Event: Pressure Injury Status: Date Acquired: 02/05/2022 Comorbid Cataracts, Anemia, Coronary Artery Disease, Deep Vein Weeks Of Treatment: 6 History: Thrombosis, Hypertension, Myocardial Infarction, Type II Diabetes, Clustered Wound: No Seizure Disorder, Anorexia/bulimia Photos Wound Measurements Length: (cm) 0.4 Width: (cm) 0.2 Depth: (cm) 0.1 Area: (cm) 0.063 Volume: (cm) 0.006 % Reduction in Area:  96.9% % Reduction in Volume: 97% Epithelialization: Large (67-100%) Tunneling: No Undermining: No Wound Description Classification: Category/Stage III Wound Margin: Distinct, outline attached Exudate Amount: Small Exudate Type: Serous Exudate Color: amber Foul Odor After Cleansing: No Slough/Fibrino Yes Wound Bed Granulation Amount: Small (1-33%) Exposed Structure Granulation Quality: Pink Fascia Exposed: No Necrotic Amount: Large (67-100%) Fat Layer (Subcutaneous Tissue) Exposed: Yes Necrotic Quality: Adherent Slough Tendon Exposed: No Muscle Exposed: No Joint Exposed: No Bone Exposed: No Treatment Notes Wound #1 (Gluteus) Wound Laterality: Right Cleanser Peri-Wound Care Skin Prep Discharge Instruction: Use skin prep as directed Topical Primary Dressing Promogran Prisma Matrix, 4.34 (sq in) (silver collagen) Discharge Instruction: Moisten collagen with saline or hydrogel Secondary Dressing Zetuvit Plus Silicone Border Dressing 4x4 (in/in) Discharge Instruction: Apply silicone border over primary dressing as directed. Secured With Compression Wrap Compression Stockings Environmental education officer) Signed: 04/22/2022 4:01:45 PM By: Sharyn Creamer RN, BSN Signed: 04/22/2022 4:52:42 PM By: Adline Peals Entered By: Sharyn Creamer on 04/22/2022 11:25:43 -------------------------------------------------------------------------------- Vitals Details Patient Name: Date of Service: [name] Locket, [place] [name] A. 04/22/2022 11:15 A M Medical Record Number: [id number] Patient Account Number: [id number] Date of Birth/Sex: Treating RN: November 14, 1945 (76 y.o. [name] Primary Care Hameed Kolar: [name] Other Clinician: Referring Eastyn Skalla: Treating Trell Secrist/Extender: [name] in Treatment: 6 Vital Signs Time Taken: 11:22 Temperature (F): 98.5 Height (in): 62 Pulse (bpm): 64 Weight (lbs): 114 Respiratory Rate (breaths/min): 16 Body  Mass Index (BMI): 20.8 Blood Pressure (mmHg): 128/63 Reference Range: 80 - 120 mg / dl Electronic Signature(s) Signed: 04/22/2022 4:52:42 PM By: Adline Peals Entered By: Adline Peals on 04/22/2022 11:22:20

## 2022-04-30 ENCOUNTER — Telehealth: Payer: Self-pay | Admitting: Internal Medicine

## 2022-04-30 NOTE — Telephone Encounter (Signed)
.  Called patient to schedule appointment per 6/27 inbasket, patient daughter is aware of date and time.

## 2022-05-01 ENCOUNTER — Encounter (HOSPITAL_BASED_OUTPATIENT_CLINIC_OR_DEPARTMENT_OTHER): Payer: Medicare Other | Admitting: General Surgery

## 2022-05-01 DIAGNOSIS — E11622 Type 2 diabetes mellitus with other skin ulcer: Secondary | ICD-10-CM | POA: Diagnosis not present

## 2022-05-01 DIAGNOSIS — D473 Essential (hemorrhagic) thrombocythemia: Secondary | ICD-10-CM | POA: Diagnosis not present

## 2022-05-01 DIAGNOSIS — E43 Unspecified severe protein-calorie malnutrition: Secondary | ICD-10-CM | POA: Diagnosis not present

## 2022-05-01 DIAGNOSIS — L89313 Pressure ulcer of right buttock, stage 3: Secondary | ICD-10-CM | POA: Diagnosis not present

## 2022-05-01 NOTE — Progress Notes (Signed)
[name], [name] ([id number]) Visit Report for 05/01/2022 Arrival Information Details Patient Name: Date of Service: [name] [name], [name] A. 05/01/2022 12:30 PM Medical Record Number: [id number] Patient Account Number: [id number] Date of Birth/Sex: Treating RN: 1946/02/26 (76 y.o. [name] [name] Primary Care Vaudie Engebretsen: [name] [name] Other Clinician: Referring [name] [name]: Treating [name] [name]/Extender: [name] [name] in Treatment: 8 Visit Information History Since Last Visit Added or deleted any medications: No Patient Arrived: Ambulatory Any new allergies or adverse reactions: No Arrival Time: 12:49 Had a fall or experienced change in Yes Accompanied By: daughter activities of daily living that may affect Transfer Assistance: None risk of falls: Patient Identification Verified: Yes Signs or symptoms of abuse/neglect since last visito No Secondary Verification Process Completed: Yes Hospitalized since last visit: No Patient Requires Transmission-Based Precautions: No Implantable device outside of the clinic excluding No Patient Has Alerts: Yes cellular tissue based products placed in the center Patient Alerts: Patient on Blood Thinner since last visit: Has Dressing in Place as Prescribed: Yes Pain Present Now: Yes Electronic Signature(s) Signed: 05/01/2022 5:59:23 PM By: Baruch Gouty RN, BSN Entered By: Baruch Gouty on 05/01/2022 12:49:43 -------------------------------------------------------------------------------- Clinic Level of Care Assessment Details Patient Name: Date of Service: [name] [name], [name] [name] A. 05/01/2022 12:30 PM Medical Record Number: [id number] Patient Account Number: [id number] Date of Birth/Sex: Treating RN: 1946/08/22 (76 y.o. [name] [name], [name] [name] Primary Care [name] [name]: [name] [name] Other Clinician: Referring [name] [name]: Treating [name] [name]/Extender: [name] [name] in Treatment: 8 Clinic Level of Care Assessment Items TOOL  4 Quantity Score '[]'$  - 0 Use when only an EandM is performed on FOLLOW-UP visit ASSESSMENTS - Nursing Assessment / Reassessment X- 1 10 Reassessment of Co-morbidities (includes updates in patient status) X- 1 5 Reassessment of Adherence to Treatment Plan ASSESSMENTS - Wound and Skin A ssessment / Reassessment X - Simple Wound Assessment / Reassessment - one wound 1 5 '[]'$  - 0 Complex Wound Assessment / Reassessment - multiple wounds '[]'$  - 0 Dermatologic / Skin Assessment (not related to wound area) ASSESSMENTS - Focused Assessment '[]'$  - 0 Circumferential Edema Measurements - multi extremities '[]'$  - 0 Nutritional Assessment / Counseling / Intervention '[]'$  - 0 Lower Extremity Assessment (monofilament, tuning fork, pulses) '[]'$  - 0 Peripheral Arterial Disease Assessment (using hand held doppler) ASSESSMENTS - Ostomy and/or Continence Assessment and Care '[]'$  - 0 Incontinence Assessment and Management '[]'$  - 0 Ostomy Care Assessment and Management (repouching, etc.) PROCESS - Coordination of Care X - Simple Patient / Family Education for ongoing care 1 15 '[]'$  - 0 Complex (extensive) Patient / Family Education for ongoing care X- 1 10 Staff obtains Programmer, systems, Records, T Results / Process Orders est '[]'$  - 0 Staff telephones HHA, Nursing Homes / Clarify orders / etc '[]'$  - 0 Routine Transfer to another Facility (non-emergent condition) '[]'$  - 0 Routine Hospital Admission (non-emergent condition) '[]'$  - 0 New Admissions / Biomedical engineer / Ordering NPWT Apligraf, etc. , '[]'$  - 0 Emergency Hospital Admission (emergent condition) X- 1 10 Simple Discharge Coordination '[]'$  - 0 Complex (extensive) Discharge Coordination PROCESS - Special Needs '[]'$  - 0 Pediatric / Minor Patient Management '[]'$  - 0 Isolation Patient Management '[]'$  - 0 Hearing / Language / Visual special needs '[]'$  - 0 Assessment of Community assistance (transportation, D/C planning, etc.) '[]'$  - 0 Additional assistance / Altered  mentation '[]'$  - 0 Support Surface(s) Assessment (bed, cushion, seat, etc.) INTERVENTIONS - Wound Cleansing / Measurement X - Simple Wound Cleansing - one wound 1 5 '[]'$  -  0 Complex Wound Cleansing - multiple wounds X- 1 5 Wound Imaging (photographs - any number of wounds) '[]'$  - 0 Wound Tracing (instead of photographs) '[]'$  - 0 Simple Wound Measurement - one wound '[]'$  - 0 Complex Wound Measurement - multiple wounds INTERVENTIONS - Wound Dressings X - Small Wound Dressing one or multiple wounds 1 10 '[]'$  - 0 Medium Wound Dressing one or multiple wounds '[]'$  - 0 Large Wound Dressing one or multiple wounds '[]'$  - 0 Application of Medications - topical '[]'$  - 0 Application of Medications - injection INTERVENTIONS - Miscellaneous '[]'$  - 0 External ear exam '[]'$  - 0 Specimen Collection (cultures, biopsies, blood, body fluids, etc.) '[]'$  - 0 Specimen(s) / Culture(s) sent or taken to Lab for analysis '[]'$  - 0 Patient Transfer (multiple staff / Civil Service fast streamer / Similar devices) '[]'$  - 0 Simple Staple / Suture removal (25 or less) '[]'$  - 0 Complex Staple / Suture removal (26 or more) '[]'$  - 0 Hypo / Hyperglycemic Management (close monitor of Blood Glucose) '[]'$  - 0 Ankle / Brachial Index (ABI) - do not check if billed separately X- 1 5 Vital Signs Has the patient been seen at the hospital within the last three years: Yes Total Score: 80 Level Of Care: New/Established - Level 3 Electronic Signature(s) Signed: 05/01/2022 5:59:23 PM By: Baruch Gouty RN, BSN Entered By: Baruch Gouty on 05/01/2022 13:07:30 -------------------------------------------------------------------------------- Encounter Discharge Information Details Patient Name: Date of Service: [name] Locket, [name] [name] A. 05/01/2022 12:30 PM Medical Record Number: [id number] Patient Account Number: [id number] Date of Birth/Sex: Treating RN: [birth year] (76 y.o. [name] [name] Primary Care Raelee Rossmann: [name] [name] Other Clinician: Referring  [name]: Treating Karam Dunson/Extender: [name] [name] in Treatment: 8 Encounter Discharge Information Items Discharge Condition: Stable Ambulatory Status: Ambulatory Discharge Destination: Home Transportation: Private Auto Accompanied By: daughter Schedule Follow-up Appointment: Yes Clinical Summary of Care: Patient Declined Electronic Signature(s) Signed: 05/01/2022 5:59:23 PM By: Baruch Gouty RN, BSN Entered By: Baruch Gouty on 05/01/2022 13:08:48 -------------------------------------------------------------------------------- Multi Wound Chart Details Patient Name: Date of Service: [name] Locket, [name] [name] A. 05/01/2022 12:30 PM Medical Record Number: [id number] Patient Account Number: [id number] Date of Birth/Sex: Treating RN: Oct 08, 1946 (76 y.o. [name] [name], [name] [name] Primary Care Hasson Gaspard: [name] [name] Other Clinician: Referring Indonesia Mckeough: Treating Leven Hoel/Extender: [name] [name] in Treatment: 8 Vital Signs Height(in): 62 Pulse(bpm): 36 Weight(lbs): 114 Blood Pressure(mmHg): 123/50 Body Mass Index(BMI): 20.8 Temperature(F): 98.5 Respiratory Rate(breaths/min): 18 Photos: [1:Right Gluteus] [N/A:N/A N/A] Wound Location: [1:Pressure Injury] [N/A:N/A] Wounding Event: [1:Pressure Ulcer] [N/A:N/A] Primary Etiology: [1:Cataracts, Anemia, Coronary Artery] [N/A:N/A] Comorbid History: [1:Disease, Deep Vein Thrombosis, Hypertension, Myocardial Infarction, Type II Diabetes, Seizure Disorder, Anorexia/bulimia 02/05/2022] [N/A:N/A] Date Acquired: [1:8] [N/A:N/A] Weeks of Treatment: [1:Healed - Epithelialized] [N/A:N/A] Wound Status: [1:No] [N/A:N/A] Wound Recurrence: [1:0x0x0] [N/A:N/A] Measurements L x W x D (cm) [1:0] [N/A:N/A] A (cm) : rea [1:0] [N/A:N/A] Volume (cm) : [1:100.00%] [N/A:N/A] % Reduction in A rea: [1:100.00%] [N/A:N/A] % Reduction in Volume: [1:Category/Stage III] [N/A:N/A] Classification: [1:None Present] [N/A:N/A] Exudate A  mount: [1:Distinct, outline attached] [N/A:N/A] Wound Margin: [1:None Present (0%)] [N/A:N/A] Granulation A mount: [1:None Present (0%)] [N/A:N/A] Necrotic A mount: [1:Fascia: No] [N/A:N/A] Exposed Structures: [1:Fat Layer (Subcutaneous Tissue): No Tendon: No Muscle: No Joint: No Bone: No Large (67-100%)] [N/A:N/A] Treatment Notes Wound #1 (Gluteus) Wound Laterality: Right Cleanser Peri-Wound Care Topical Primary Dressing Secondary Dressing Secured With Compression Wrap Compression Stockings Add-Ons Notes foam border applied Electronic Signature(s) Signed: 05/01/2022 1:21:52 PM By: Fredirick Maudlin MD FACS Signed: 05/01/2022 5:59:23 PM  By: Baruch Gouty RN, BSN Entered By: Fredirick Maudlin on 05/01/2022 13:21:52 -------------------------------------------------------------------------------- Multi-Disciplinary Care Plan Details Patient Name: Date of Service: [name] [name], [name] [name] A. 05/01/2022 12:30 PM Medical Record Number: [id number] Patient Account Number: [id number] Date of Birth/Sex: Treating RN: [birth year] (76 y.o. [name] [name] Primary Care Rodolfo Gaster: [name] [name] Other Clinician: Referring Zykira Matlack: Treating Bryauna Byrum/Extender: [name] [name] in Treatment: Spencer reviewed with physician Active Inactive Electronic Signature(s) Signed: 05/01/2022 5:59:23 PM By: Baruch Gouty RN, BSN Entered By: Baruch Gouty on 05/01/2022 13:08:04 -------------------------------------------------------------------------------- Pain Assessment Details Patient Name: Date of Service: [name] [name], [name] [name] A. 05/01/2022 12:30 PM Medical Record Number: [id number] Patient Account Number: [id number] Date of Birth/Sex: Treating RN: 1946/09/30 (77 y.o. [name] [name] Primary Care Sun Kihn: [name] [name] Other Clinician: Referring Tonimarie Gritz: Treating Arieonna Medine/Extender: [name] [name] in Treatment: 8 Active Problems Location  of Pain Severity and Description of Pain Patient Has Paino Yes Site Locations Pain Location: Generalized Pain With Dressing Change: Yes Duration of the Pain. Constant / Intermittento Intermittent Rate the pain. Current Pain Level: 5 Character of Pain Describe the Pain: Aching Pain Management and Medication Current Pain Management: Medication: Yes Is the Current Pain Management Adequate: Adequate How does your wound impact your activities of daily livingo Sleep: No Bathing: No Appetite: No Relationship With Others: No Bladder Continence: No Emotions: No Bowel Continence: No Work: No Toileting: No Drive: No Dressing: No Hobbies: No Electronic Signature(s) Signed: 05/01/2022 5:59:23 PM By: Baruch Gouty RN, BSN Entered By: Baruch Gouty on 05/01/2022 12:50:34 -------------------------------------------------------------------------------- Patient/Caregiver Education Details Patient Name: Date of Service: [name] [name], [name] 6/29/2023andnbsp12:30 PM Medical Record Number: [id number] Patient Account Number: [id number] Date of Birth/Gender: Treating RN: [birth year] (76 y.o. [name] [name] Primary Care Physician: [name] [name] Other Clinician: Referring Physician: Treating Physician/Extender: [name] [name] in Treatment: 8 Education Assessment Education Provided To: Patient Education Topics Provided Pressure: Methods: Explain/Verbal Responses: Reinforcements needed, State content correctly Wound/Skin Impairment: Methods: Explain/Verbal Responses: Reinforcements needed, State content correctly Electronic Signature(s) Signed: 05/01/2022 5:59:23 PM By: Baruch Gouty RN, BSN Entered By: Baruch Gouty on 05/01/2022 12:58:33 -------------------------------------------------------------------------------- Wound Assessment Details Patient Name: Date of Service: [name] Locket, [name] [name] A. 05/01/2022 12:30 PM Medical Record Number: [id number] Patient  Account Number: [id number] Date of Birth/Sex: Treating RN: October 10, 1946 (76 y.o. [name] [name] Primary Care Xavius Spadafore: [name] [name] Other Clinician: Referring Crescent Gotham: Treating Yurika Pereda/Extender: [name] [name] in Treatment: 8 Wound Status Wound Number: 1 Primary Pressure Ulcer Etiology: Wound Location: Right Gluteus Wound Healed - Epithelialized Wounding Event: Pressure Injury Status: Date Acquired: 02/05/2022 Comorbid Cataracts, Anemia, Coronary Artery Disease, Deep Vein Weeks Of Treatment: 8 History: Thrombosis, Hypertension, Myocardial Infarction, Type II Diabetes, Clustered Wound: No Seizure Disorder, Anorexia/bulimia Photos Wound Measurements Length: (cm) Width: (cm) Depth: (cm) Area: (cm) Volume: (cm) 0 % Reduction in Area: 100% 0 % Reduction in Volume: 100% 0 Epithelialization: Large (67-100%) 0 Tunneling: No 0 Undermining: No Wound Description Classification: Category/Stage III Wound Margin: Distinct, outline attached Exudate Amount: None Present Foul Odor After Cleansing: No Slough/Fibrino No Wound Bed Granulation Amount: None Present (0%) Exposed Structure Necrotic Amount: None Present (0%) Fascia Exposed: No Fat Layer (Subcutaneous Tissue) Exposed: No Tendon Exposed: No Muscle Exposed: No Joint Exposed: No Bone Exposed: No Treatment Notes Wound #1 (Gluteus) Wound Laterality: Right Cleanser Peri-Wound Care Topical Primary Dressing Secondary Dressing Secured With Compression Wrap Compression Stockings Add-Ons Notes foam border applied Electronic Signature(s) Signed: 05/01/2022 5:59:23 PM By:  Baruch Gouty RN, BSN Entered By: Baruch Gouty on 05/01/2022 13:01:22 -------------------------------------------------------------------------------- Vitals Details Patient Name: Date of Service: [name] [name], [name] [name] A. 05/01/2022 12:30 PM Medical Record Number: [id number] Patient Account Number: [id number] Date of  Birth/Sex: Treating RN: 03-Nov-1946 (76 y.o. [name] [name] Primary Care Nevea Spiewak: [name] [name] Other Clinician: Referring Bertran Zeimet: Treating Cally Nygard/Extender: [name] [name] in Treatment: 8 Vital Signs Time Taken: 12:49 Temperature (F): 98.5 Height (in): 62 Pulse (bpm): 63 Weight (lbs): 114 Respiratory Rate (breaths/min): 18 Body Mass Index (BMI): 20.8 Blood Pressure (mmHg): 123/50 Reference Range: 80 - 120 mg / dl Electronic Signature(s) Signed: 05/01/2022 5:59:23 PM By: Baruch Gouty RN, BSN Entered By: Baruch Gouty on 05/01/2022 12:50:07

## 2022-05-01 NOTE — Progress Notes (Signed)
Ensminger, Kyliah A. ([id number]) Visit Report for 05/01/2022 Chief Complaint Document Details Patient Name: Date of Service: [name] [name], [place] A. 05/01/2022 12:30 PM Medical Record Number: [id number] Patient Account Number: [id number] Date of Birth/Sex: Treating RN: 10/13/1946 (76 y.o. [name] Other Clinician: Referring Provider: Treating Provider/Extender: [name] in Treatment: 8 Information Obtained from: Patient Chief Complaint Patient is at the clinic for treatment of an open pressure ulcer Electronic Signature(s) Signed: 05/01/2022 1:23:23 PM By: Fredirick Maudlin MD FACS Entered By: Fredirick Maudlin on 05/01/2022 13:23:23 -------------------------------------------------------------------------------- HPI Details Patient Name: Date of Service: [name] Locket, [name] [name] 05/01/2022 12:30 PM Medical Record Number: [id number] Patient Account Number: [id number] Date of Birth/Sex: Treating RN: Jan 23, 1946 (76 y.o. [name] Other Clinician: Referring Provider: Treating Provider/Extender: [name] in Treatment: 8 History of Present Illness HPI Description: ADMISSION 03/05/2022 This is a 76 year old woman with a past medical history notable for type 2 diabetes mellitus, hypertension, coronary artery disease, and essential thrombocythemia. Her husband died fairly recently and in that time she has lost a substantial amount of weight. She is accompanied by one of her daughters who is a Marine scientist. Her other daughter is also a Marine scientist and lives next door. On February 05, 2022, the patient notified her daughters that she had a sore on her right buttock. Apparently 2 weeks prior to that, she fell in the yard and landed on her backside, but landed on grass and did not seem to sustain any obvious trauma. Since reporting the wound to her daughters, they have used some Santyl  that they had leftover and have been using Medihoney since they ran out of that. She does not take insulin and her last hemoglobin A1c was reported to be 5.7. She is on hydroxyurea for essential thrombocythemia. On her right gluteus, there is a circular wound with thick fibrinous slough. There is some visible pink tissue underneath the slough at the periphery of the wound. No odor or significant drainage. 03/11/2022: The wound measured slightly larger today. It is a bit cleaner with less slough accumulation. There are buds of granulation tissue beginning to emerge. No concern for infection. 03/19/2022: The wound is a little bit smaller today. It still has about 50% surface covered with slough. There is granulation tissue continuing to emerge. 03/25/2022: The wound is smaller today. There is granulation tissue emerging through a thin layer of slough. The patient states that she is "gagging down" the protein supplements. 04/02/2022: Her wound is smaller, more superficial, and cleaner today. Her daughter has been monitoring her protein intake and the patient has been managing to get several doses of Pro-Stat daily. 04/11/2022: The wound continues to contract. The surface is much cleaner today. She is doing well with her protein intake due to her daughter's insistence. 04/17/2022: The wound is smaller again today. There is a light layer of slough on the surface. 04/22/2022: The wound is smaller yet again. She continues to accumulate a light layer of slough. No concern for infection. 05/01/2022: Her wound is closed. Electronic Signature(s) Signed: 05/01/2022 1:23:43 PM By: Fredirick Maudlin MD FACS Entered By: Fredirick Maudlin on 05/01/2022 13:23:43 -------------------------------------------------------------------------------- Physical Exam Details Patient Name: Date of Service: [name] Locket, [name] [name] 05/01/2022 12:30 PM Medical Record Number: [id number] Patient Account Number: [id number] Date of Birth/Sex:  Treating RN: 01/29/1946 (76 y.o. [name] Other Clinician: Referring Provider: Treating Provider/Extender:  Annitta Needs Weeks in Treatment: 8 Constitutional . . . . No acute distress.Marland Kitchen Respiratory Normal work of breathing on room air.. Notes 05/01/2022: Her wound is closed. Electronic Signature(s) Signed: 05/01/2022 1:24:17 PM By: Fredirick Maudlin MD FACS Entered By: Fredirick Maudlin on 05/01/2022 13:24:17 -------------------------------------------------------------------------------- Physician Orders Details Patient Name: Date of Service: [name] Locket, [name] [name] 05/01/2022 12:30 PM Medical Record Number: [id number] Patient Account Number: [id number] Date of Birth/Sex: Treating RN: 09-Jan-1946 (76 y.o. [name] Other Clinician: Referring Provider: Treating Provider/Extender: [name] in Treatment: 8 Verbal / Phone Orders: No Diagnosis Coding ICD-10 Coding Code Description L89.313 Pressure ulcer of right buttock, stage 3 E11.622 Type 2 diabetes mellitus with other skin ulcer E43 Unspecified severe protein-calorie malnutrition D47.3 Essential (hemorrhagic) thrombocythemia Discharge From [facility] Services Discharge from [place] Bathing/ Shower/ Hygiene May shower and wash wound with soap and water. Off-Loading Turn and reposition every 2 hours Additional Orders / Instructions Follow Nutritious Diet - add protein supplements to diet continue Prostat for 2 more weeks Non Wound Condition Protect area with: - cover healed right gluteus area with foam border for 1-2 more weeks Electronic Signature(s) Signed: 05/01/2022 2:00:21 PM By: Fredirick Maudlin MD FACS Entered By: Fredirick Maudlin on 05/01/2022 13:24:23 -------------------------------------------------------------------------------- Problem List Details Patient Name: Date of Service: [name] Locket, [name] [name] 05/01/2022 12:30 PM Medical Record Number: [id number] Patient Account Number: [id number] Date of Birth/Sex: Treating RN: 1946/05/06 (76 y.o. [name] Other Clinician: Referring Provider: Treating Provider/Extender: [name] in Treatment: 8 Active Problems ICD-10 Encounter Code Description Active Date MDM Diagnosis L89.313 Pressure ulcer of right buttock, stage 3 03/05/2022 No Yes E11.622 Type 2 diabetes mellitus with other skin ulcer 03/05/2022 No Yes E43 Unspecified severe protein-calorie malnutrition 03/05/2022 No Yes D47.3 Essential (hemorrhagic) thrombocythemia 03/05/2022 No Yes Inactive Problems Resolved Problems Electronic Signature(s) Signed: 05/01/2022 1:20:18 PM By: Fredirick Maudlin MD FACS Entered By: Fredirick Maudlin on 05/01/2022 13:20:18 -------------------------------------------------------------------------------- Progress Note Details Patient Name: Date of Service: [name] Locket, [name] [name] 05/01/2022 12:30 PM Medical Record Number: [id number] Patient Account Number: [id number] Date of Birth/Sex: Treating RN: 02-10-1946 (76 y.o. [name] Other Clinician: Referring Provider: Treating Provider/Extender: [name] in Treatment: 8 Subjective Chief Complaint Information obtained from Patient Patient is at the clinic for treatment of an open pressure ulcer History of Present Illness (HPI) ADMISSION 03/05/2022 This is a 76 year old woman with a past medical history notable for type 2 diabetes mellitus, hypertension, coronary artery disease, and essential thrombocythemia. Her husband died fairly recently and in that time she has lost a substantial amount of weight. She is accompanied by one of her daughters who is a Marine scientist. Her other daughter is also a Marine scientist and lives next door. On February 05, 2022, the patient notified her  daughters that she had a sore on her right buttock. Apparently 2 weeks prior to that, she fell in the yard and landed on her backside, but landed on grass and did not seem to sustain any obvious trauma. Since reporting the wound to her daughters, they have used some Santyl that they had leftover and have been using Medihoney since they ran out of that. She does not take insulin and her last hemoglobin A1c was reported to be 5.7. She is on hydroxyurea for essential thrombocythemia. On her right gluteus, there is a circular wound  with thick fibrinous slough. There is some visible pink tissue underneath the slough at the periphery of the wound. No odor or significant drainage. 03/11/2022: The wound measured slightly larger today. It is a bit cleaner with less slough accumulation. There are buds of granulation tissue beginning to emerge. No concern for infection. 03/19/2022: The wound is a little bit smaller today. It still has about 50% surface covered with slough. There is granulation tissue continuing to emerge. 03/25/2022: The wound is smaller today. There is granulation tissue emerging through a thin layer of slough. The patient states that she is "gagging down" the protein supplements. 04/02/2022: Her wound is smaller, more superficial, and cleaner today. Her daughter has been monitoring her protein intake and the patient has been managing to get several doses of Pro-Stat daily. 04/11/2022: The wound continues to contract. The surface is much cleaner today. She is doing well with her protein intake due to her daughter's insistence. 04/17/2022: The wound is smaller again today. There is a light layer of slough on the surface. 04/22/2022: The wound is smaller yet again. She continues to accumulate a light layer of slough. No concern for infection. 05/01/2022: Her wound is closed. Patient History Information obtained from Patient, Chart. Family History Heart Disease - Child,Father, Hypertension -  Mother, No family history of Cancer, Diabetes, Hereditary Spherocytosis, Kidney Disease, Lung Disease, Seizures, Stroke, Thyroid Problems, Tuberculosis. Social History Never smoker, Marital Status - Widowed, Alcohol Use - Rarely, Drug Use - No History, Caffeine Use - Rarely. Medical History Eyes Patient has history of Cataracts - bil removed Denies history of Glaucoma, Optic Neuritis Hematologic/Lymphatic Patient has history of Anemia Denies history of Hemophilia, Human Immunodeficiency Virus, Lymphedema, Sickle Cell Disease Cardiovascular Patient has history of Coronary Artery Disease, Deep Vein Thrombosis - leg, Hypertension, Myocardial Infarction Endocrine Patient has history of Type II Diabetes Denies history of Type I Diabetes Genitourinary Denies history of End Stage Renal Disease Integumentary (Skin) Denies history of History of Burn Neurologic Patient has history of Seizure Disorder - focal sz Oncologic Denies history of Received Chemotherapy, Received Radiation Psychiatric Patient has history of Anorexia/bulimia - poor appetite Denies history of Confinement Anxiety Hospitalization/Surgery History - ORIF right radius 2019. - left hand IandD. - coronary angioplasty with stent 2004. - c-section. - bil cataract ectraction. Medical A Surgical History Notes nd Cardiovascular hyperlipidemia Objective Constitutional No acute distress.. Vitals Time Taken: 12:49 PM, Height: 62 in, Weight: 114 lbs, BMI: 20.8, Temperature: 98.5 F, Pulse: 63 bpm, Respiratory Rate: 18 breaths/min, Blood Pressure: 123/50 mmHg. Respiratory Normal work of breathing on room air.. General Notes: 05/01/2022: Her wound is closed. Integumentary (Hair, Skin) Wound #1 status is Healed - Epithelialized. Original cause of wound was Pressure Injury. The date acquired was: 02/05/2022. The wound has been in treatment 8 weeks. The wound is located on the Right Gluteus. The wound measures 0cm length x 0cm width x  0cm depth; 0cm^2 area and 0cm^3 volume. There is no tunneling or undermining noted. There is a none present amount of drainage noted. The wound margin is distinct with the outline attached to the wound base. There is no granulation within the wound bed. There is no necrotic tissue within the wound bed. Assessment Active Problems ICD-10 Pressure ulcer of right buttock, stage 3 Type 2 diabetes mellitus with other skin ulcer Unspecified severe protein-calorie malnutrition Essential (hemorrhagic) thrombocythemia Plan Discharge From [facility] Services: Discharge from [place] Bathing/ Shower/ Hygiene: May shower and wash wound with soap and water. Off-Loading: Turn  and reposition every 2 hours Additional Orders / Instructions: Follow Nutritious Diet - add protein supplements to diet continue Prostat for 2 more weeks Non Wound Condition: Protect area with: - cover healed right gluteus area with foam border for 1-2 more weeks 05/01/2022: Her wound is closed. I have recommended that she continue to try to stay off of her bottom to avoid the wound reopening. She should continue her protein intake as it was likely severe protein calorie malnutrition that contributed to her getting the wound in the first place. We will discharge her from the wound care center. She may follow-up as needed. Electronic Signature(s) Signed: 05/01/2022 1:25:15 PM By: Fredirick Maudlin MD FACS Entered By: Fredirick Maudlin on 05/01/2022 13:25:15 -------------------------------------------------------------------------------- HxROS Details Patient Name: Date of Service: [name] Locket, [name] [name] 05/01/2022 12:30 PM Medical Record Number: [id number] Patient Account Number: [id number] Date of Birth/Sex: Treating RN: 14-Nov-1945 (76 y.o. [name] Other Clinician: Referring Provider: Treating Provider/Extender: [name] in Treatment: 8 Information Obtained  From Patient Chart Eyes Medical History: Positive for: Cataracts - bil removed Negative for: Glaucoma; Optic Neuritis Hematologic/Lymphatic Medical History: Positive for: Anemia Negative for: Hemophilia; Human Immunodeficiency Virus; Lymphedema; Sickle Cell Disease Cardiovascular Medical History: Positive for: Coronary Artery Disease; Deep Vein Thrombosis - leg; Hypertension; Myocardial Infarction Past Medical History Notes: hyperlipidemia Endocrine Medical History: Positive for: Type II Diabetes Negative for: Type I Diabetes Time with diabetes: age 50 Treated with: Oral agents Blood sugar tested every day: Yes Tested : once Genitourinary Medical History: Negative for: End Stage Renal Disease Integumentary (Skin) Medical History: Negative for: History of Burn Neurologic Medical History: Positive for: Seizure Disorder - focal sz Oncologic Medical History: Negative for: Received Chemotherapy; Received Radiation Psychiatric Medical History: Positive for: Anorexia/bulimia - poor appetite Negative for: Confinement Anxiety HBO Extended History Items Eyes: Cataracts Immunizations Pneumococcal Vaccine: Received Pneumococcal Vaccination: Yes Received Pneumococcal Vaccination On or After 60th Birthday: Yes Implantable Devices No devices added Hospitalization / Surgery History Type of Hospitalization/Surgery ORIF right radius 2019 left hand IandD coronary angioplasty with stent 2004 c-section bil cataract ectraction Family and Social History Cancer: No; Diabetes: No; Heart Disease: Yes - Child,Father; Hereditary Spherocytosis: No; Hypertension: Yes - Mother; Kidney Disease: No; Lung Disease: No; Seizures: No; Stroke: No; Thyroid Problems: No; Tuberculosis: No; Never smoker; Marital Status - Widowed; Alcohol Use: Rarely; Drug Use: No History; Caffeine Use: Rarely; Financial Concerns: No; Food, Clothing or Shelter Needs: No; Support System Lacking: No; Transportation  Concerns: No Engineer, maintenance) Signed: 05/01/2022 2:00:21 PM By: Fredirick Maudlin MD FACS Signed: 05/01/2022 5:59:23 PM By: Baruch Gouty RN, BSN Entered By: Fredirick Maudlin on 05/01/2022 13:23:51 -------------------------------------------------------------------------------- SuperBill Details Patient Name: Date of Service: [name] Locket, [name] [name] 05/01/2022 Medical Record Number: [id number] Patient Account Number: [id number] Date of Birth/Sex: Treating RN: [birth year] (76 y.o. [name] Other Clinician: Referring Provider: Treating Provider/Extender: [name] in Treatment: 8 Diagnosis Coding ICD-10 Codes Code Description [phone] Pressure ulcer of right buttock, stage 3 E11.622 Type 2 diabetes mellitus with other skin ulcer E43 Unspecified severe protein-calorie malnutrition D47.3 Essential (hemorrhagic) thrombocythemia Facility Procedures CPT4 Code: [id number] Description: 99213 - WOUND CARE VISIT-LEV 3 EST PT Modifier: Quantity: 1 Physician Procedures : CPT4 Code Description Modifier [id number] - WC PHYS LEVEL 3 - EST PT ICD-10 Diagnosis Description L89.313 Pressure ulcer of right buttock, stage 3 E43 Unspecified severe protein-calorie malnutrition E11.622 Type 2 diabetes mellitus  with other skin  ulcer Quantity: 1 Electronic Signature(s) Signed: 05/01/2022 1:27:00 PM By: Fredirick Maudlin MD FACS Entered By: Fredirick Maudlin on 05/01/2022 13:27:00

## 2022-05-09 DIAGNOSIS — Z0000 Encounter for general adult medical examination without abnormal findings: Secondary | ICD-10-CM | POA: Diagnosis not present

## 2022-05-09 DIAGNOSIS — E785 Hyperlipidemia, unspecified: Secondary | ICD-10-CM | POA: Diagnosis not present

## 2022-05-09 DIAGNOSIS — R7989 Other specified abnormal findings of blood chemistry: Secondary | ICD-10-CM | POA: Diagnosis not present

## 2022-05-09 DIAGNOSIS — D649 Anemia, unspecified: Secondary | ICD-10-CM | POA: Diagnosis not present

## 2022-05-09 DIAGNOSIS — Z79899 Other long term (current) drug therapy: Secondary | ICD-10-CM | POA: Diagnosis not present

## 2022-05-09 DIAGNOSIS — E1165 Type 2 diabetes mellitus with hyperglycemia: Secondary | ICD-10-CM | POA: Diagnosis not present

## 2022-05-12 DIAGNOSIS — D649 Anemia, unspecified: Secondary | ICD-10-CM | POA: Diagnosis not present

## 2022-05-12 DIAGNOSIS — R413 Other amnesia: Secondary | ICD-10-CM | POA: Diagnosis not present

## 2022-05-13 DIAGNOSIS — Z1389 Encounter for screening for other disorder: Secondary | ICD-10-CM | POA: Diagnosis not present

## 2022-05-13 DIAGNOSIS — Z1331 Encounter for screening for depression: Secondary | ICD-10-CM | POA: Diagnosis not present

## 2022-05-13 DIAGNOSIS — Z7901 Long term (current) use of anticoagulants: Secondary | ICD-10-CM | POA: Diagnosis not present

## 2022-05-13 DIAGNOSIS — M8580 Other specified disorders of bone density and structure, unspecified site: Secondary | ICD-10-CM | POA: Diagnosis not present

## 2022-05-13 DIAGNOSIS — I2510 Atherosclerotic heart disease of native coronary artery without angina pectoris: Secondary | ICD-10-CM | POA: Diagnosis not present

## 2022-05-13 DIAGNOSIS — R6889 Other general symptoms and signs: Secondary | ICD-10-CM | POA: Diagnosis not present

## 2022-05-13 DIAGNOSIS — D473 Essential (hemorrhagic) thrombocythemia: Secondary | ICD-10-CM | POA: Diagnosis not present

## 2022-05-13 DIAGNOSIS — E785 Hyperlipidemia, unspecified: Secondary | ICD-10-CM | POA: Diagnosis not present

## 2022-05-13 DIAGNOSIS — F329 Major depressive disorder, single episode, unspecified: Secondary | ICD-10-CM | POA: Diagnosis not present

## 2022-05-13 DIAGNOSIS — G40909 Epilepsy, unspecified, not intractable, without status epilepticus: Secondary | ICD-10-CM | POA: Diagnosis not present

## 2022-05-13 DIAGNOSIS — I119 Hypertensive heart disease without heart failure: Secondary | ICD-10-CM | POA: Diagnosis not present

## 2022-05-13 DIAGNOSIS — E1165 Type 2 diabetes mellitus with hyperglycemia: Secondary | ICD-10-CM | POA: Diagnosis not present

## 2022-05-13 DIAGNOSIS — D492 Neoplasm of unspecified behavior of bone, soft tissue, and skin: Secondary | ICD-10-CM | POA: Diagnosis not present

## 2022-05-13 DIAGNOSIS — Z0000 Encounter for general adult medical examination without abnormal findings: Secondary | ICD-10-CM | POA: Diagnosis not present

## 2022-05-15 ENCOUNTER — Inpatient Hospital Stay: Payer: Medicare Other | Admitting: Internal Medicine

## 2022-05-15 ENCOUNTER — Inpatient Hospital Stay: Payer: Medicare Other

## 2022-05-20 ENCOUNTER — Inpatient Hospital Stay: Payer: Medicare Other | Attending: Internal Medicine

## 2022-05-20 ENCOUNTER — Other Ambulatory Visit: Payer: Self-pay

## 2022-05-20 ENCOUNTER — Inpatient Hospital Stay (HOSPITAL_BASED_OUTPATIENT_CLINIC_OR_DEPARTMENT_OTHER): Payer: Medicare Other | Admitting: Internal Medicine

## 2022-05-20 VITALS — BP 124/56 | HR 72 | Temp 98.3°F | Wt 117.2 lb

## 2022-05-20 DIAGNOSIS — Z79899 Other long term (current) drug therapy: Secondary | ICD-10-CM | POA: Insufficient documentation

## 2022-05-20 DIAGNOSIS — D75839 Thrombocytosis, unspecified: Secondary | ICD-10-CM | POA: Insufficient documentation

## 2022-05-20 DIAGNOSIS — D473 Essential (hemorrhagic) thrombocythemia: Secondary | ICD-10-CM | POA: Diagnosis not present

## 2022-05-20 DIAGNOSIS — Z7901 Long term (current) use of anticoagulants: Secondary | ICD-10-CM | POA: Diagnosis not present

## 2022-05-20 DIAGNOSIS — E785 Hyperlipidemia, unspecified: Secondary | ICD-10-CM | POA: Insufficient documentation

## 2022-05-20 DIAGNOSIS — E119 Type 2 diabetes mellitus without complications: Secondary | ICD-10-CM | POA: Insufficient documentation

## 2022-05-20 DIAGNOSIS — Z7984 Long term (current) use of oral hypoglycemic drugs: Secondary | ICD-10-CM | POA: Insufficient documentation

## 2022-05-20 DIAGNOSIS — Z86718 Personal history of other venous thrombosis and embolism: Secondary | ICD-10-CM | POA: Insufficient documentation

## 2022-05-20 DIAGNOSIS — I2510 Atherosclerotic heart disease of native coronary artery without angina pectoris: Secondary | ICD-10-CM | POA: Insufficient documentation

## 2022-05-20 DIAGNOSIS — I252 Old myocardial infarction: Secondary | ICD-10-CM | POA: Diagnosis not present

## 2022-05-20 DIAGNOSIS — I10 Essential (primary) hypertension: Secondary | ICD-10-CM | POA: Insufficient documentation

## 2022-05-20 LAB — CMP (CANCER CENTER ONLY)
ALT: 9 U/L (ref 0–44)
AST: 19 U/L (ref 15–41)
Albumin: 4.1 g/dL (ref 3.5–5.0)
Alkaline Phosphatase: 37 U/L — ABNORMAL LOW (ref 38–126)
Anion gap: 7 (ref 5–15)
BUN: 25 mg/dL — ABNORMAL HIGH (ref 8–23)
CO2: 26 mmol/L (ref 22–32)
Calcium: 9 mg/dL (ref 8.9–10.3)
Chloride: 109 mmol/L (ref 98–111)
Creatinine: 0.79 mg/dL (ref 0.44–1.00)
GFR, Estimated: >60 mL/min (ref >60)
Glucose, Bld: 155 mg/dL — ABNORMAL HIGH (ref 70–99)
Potassium: 4.5 mmol/L (ref 3.5–5.1)
Sodium: 142 mmol/L (ref 135–145)
Total Bilirubin: 0.4 mg/dL (ref 0.3–1.2)
Total Protein: 6 g/dL — ABNORMAL LOW (ref 6.5–8.1)

## 2022-05-20 LAB — CBC WITH DIFFERENTIAL (CANCER CENTER ONLY)
Abs Immature Granulocytes: 0.04 10*3/uL (ref 0.00–0.07)
Basophils Absolute: 0 10*3/uL (ref 0.0–0.1)
Basophils Relative: 0 %
Eosinophils Absolute: 0.1 10*3/uL (ref 0.0–0.5)
Eosinophils Relative: 2 %
HCT: 24.7 % — ABNORMAL LOW (ref 36.0–46.0)
Hemoglobin: 7.9 g/dL — ABNORMAL LOW (ref 12.0–15.0)
Immature Granulocytes: 1 %
Lymphocytes Relative: 11 %
Lymphs Abs: 0.5 10*3/uL — ABNORMAL LOW (ref 0.7–4.0)
MCH: 34.2 pg — ABNORMAL HIGH (ref 26.0–34.0)
MCHC: 32 g/dL (ref 30.0–36.0)
MCV: 106.9 fL — ABNORMAL HIGH (ref 80.0–100.0)
Monocytes Absolute: 0.2 10*3/uL (ref 0.1–1.0)
Monocytes Relative: 4 %
Neutro Abs: 4 10*3/uL (ref 1.7–7.7)
Neutrophils Relative %: 82 %
Platelet Count: 617 10*3/uL — ABNORMAL HIGH (ref 150–400)
RBC: 2.31 MIL/uL — ABNORMAL LOW (ref 3.87–5.11)
RDW: 20 % — ABNORMAL HIGH (ref 11.5–15.5)
WBC Count: 4.9 10*3/uL (ref 4.0–10.5)
nRBC: 0.6 % — ABNORMAL HIGH (ref 0.0–0.2)

## 2022-05-20 MED ORDER — ANAGRELIDE HCL 0.5 MG PO CAPS
0.5000 mg | ORAL_CAPSULE | Freq: Every day | ORAL | 2 refills | Status: AC
Start: 2022-05-20 — End: ?

## 2022-05-20 NOTE — Progress Notes (Signed)
[place] [phone]   [phone]  OFFICE PROGRESS NOTE  [name], MD [place] [address]  DIAGNOSIS: Essential thrombocythemia with positive JAK2 mutation [id number] diagnosed in October 2020.  PRIOR THERAPY: None  CURRENT THERAPY: Hydroxyurea 500 mg p.o. daily in addition to anagrelide 0.5 mg p.o. daily.   INTERVAL HISTORY: [name] [name] 76 y.o. female returns to the clinic today for follow-up visit accompanied by her daughter [name].  The patient is feeling fine today with no concerning complaints except for mild fatigue.  She has no bleeding, bruises or ecchymosis.  She denied having any chest pain, shortness of breath, cough or hemoptysis.  She denied having any fever or chills.  She has no nausea, vomiting, diarrhea or constipation.  She has been tolerating her treatment with hydroxyurea fairly well.  The patient is here today for evaluation and repeat blood work.  MEDICAL HISTORY: Past Medical History:  Diagnosis Date   Anemia    Coronary artery disease    s/p stent to LAD and LCx   DM II (diabetes mellitus, type II), controlled ([place])    DVT (deep venous thrombosis) ([facility])    Elevated LFTs    Hyperlipidemia    Hypertension    Memory loss    Myocardial infarction ([place])    7 yrs ago    ALLERGIES:  is allergic to ampicillin, avelox [moxifloxacin hcl in nacl], bactrim [sulfamethoxazole-trimethoprim], ibuprofen, penicillins, diphenhydramine, sulfa antibiotics, codeine, latex, and whey.  MEDICATIONS:  Current Outpatient Medications  Medication Sig Dispense Refill   fenofibrate micronized (LOFIBRA) 200 MG capsule TAKE ONE CAPSULE BY MOUTH ONCE DAILY BEFORE BREAKFAST 90 capsule 2   ferrous sulfate 325 (65 FE) MG EC tablet Take 325 mg by mouth as needed.     Fish Oil-Cholecalciferol (FISH OIL + D3) 1000-1000 MG-UNIT CAPS Take 1 tablet by mouth 3 (three) times daily.     hydroxyurea (HYDREA) 500 MG capsule TAKE 1  CAPSULE BY MOUTH ONCE DAILY. MAY TAKE WITH FOOD TO  MINIMIZE  GI  SIDE  EFFECTS. 90 capsule 2   levETIRAcetam (KEPPRA) 250 MG tablet Take 1 tablet (250 mg total) by mouth 2 (two) times daily. 180 tablet 4   metFORMIN (GLUCOPHAGE) 500 MG tablet Take 1 tablet by mouth daily.     nitroGLYCERIN (NITROSTAT) 0.4 MG SL tablet Place 1 tablet (0.4 mg total) under the tongue every 5 (five) minutes as needed. For chest pain. 25 tablet 0   omeprazole (PRILOSEC) 40 MG capsule Take by mouth.     prochlorperazine (COMPAZINE) 10 MG tablet Take 1 tablet (10 mg total) by mouth every 6 (six) hours as needed for nausea or vomiting. 30 tablet 2   rosuvastatin (CRESTOR) 20 MG tablet Take 10 mg by mouth daily.     warfarin (COUMADIN) 2 MG tablet Take 2 mg by mouth daily.     Zinc 25 MG TABS Take 1 tablet by mouth 2 (two) times daily.     No current facility-administered medications for this visit.    SURGICAL HISTORY:  Past Surgical History:  Procedure Laterality Date   CATARACT EXTRACTION     CESAREAN SECTION  1983   CORONARY ANGIOPLASTY WITH STENT PLACEMENT  04/26/2003   NORMAL. EF 65%   I & D EXTREMITY  06/22/2012   Procedure: IRRIGATION AND DEBRIDEMENT EXTREMITY;  Surgeon: Tennis Must, MD;  Location: [facility];  Service: Orthopedics;  Laterality: Left;   I & D  EXTREMITY  06/29/2012   Procedure: IRRIGATION AND DEBRIDEMENT EXTREMITY;  Surgeon: Tennis Must, MD;  Location: [facility];  Service: Orthopedics;  Laterality: Left;   OPEN REDUCTION INTERNAL FIXATION (ORIF) DISTAL RADIAL FRACTURE Right 02/04/2018   Procedure: OPEN REDUCTION INTERNAL FIXATION (ORIF) RIGHT DISTAL RADIAL FRACTURE;  Surgeon: Leanora Cover, MD;  Location: [facility];  Service: Orthopedics;  Laterality: Right;    REVIEW OF SYSTEMS:  A comprehensive review of systems was negative except for: Constitutional: positive for fatigue   PHYSICAL EXAMINATION: General appearance: alert, cooperative, fatigued, and no distress Head:  Normocephalic, without obvious abnormality, atraumatic Neck: no adenopathy, no JVD, supple, symmetrical, trachea midline, and thyroid not enlarged, symmetric, no tenderness/mass/nodules Lymph nodes: Cervical, supraclavicular, and axillary nodes normal. Resp: clear to auscultation bilaterally Back: symmetric, no curvature. ROM normal. No CVA tenderness. Cardio: regular rate and rhythm, S1, S2 normal, no murmur, click, rub or gallop GI: soft, non-tender; bowel sounds normal; no masses,  no organomegaly Extremities: extremities normal, atraumatic, no cyanosis or edema  ECOG PERFORMANCE STATUS: 1 - Symptomatic but completely ambulatory  Blood pressure (!) 124/56, pulse 72, temperature 98.3 F (36.8 C), temperature source Temporal, weight 117 lb 3.2 oz (53.2 kg), SpO2 97 %.  LABORATORY DATA: Lab Results  Component Value Date   WBC 4.9 05/20/2022   HGB 7.9 (L) 05/20/2022   HCT 24.7 (L) 05/20/2022   MCV 106.9 (H) 05/20/2022   PLT 617 (H) 05/20/2022      Chemistry      Component Value Date/Time   NA 140 01/13/2022 1333   NA 141 01/29/2021 1107   K 4.1 01/13/2022 1333   CL 106 01/13/2022 1333   CO2 28 01/13/2022 1333   BUN 16 01/13/2022 1333   BUN 17 01/29/2021 1107   CREATININE 0.75 01/13/2022 1333      Component Value Date/Time   CALCIUM 8.9 01/13/2022 1333   ALKPHOS 117 01/13/2022 1333   AST 21 01/13/2022 1333   ALT 28 01/13/2022 1333   BILITOT 0.8 01/13/2022 1333       RADIOGRAPHIC STUDIES: No results found.  ASSESSMENT AND PLAN: This is a very pleasant 76 years old white female recently diagnosed with essential thrombocythemia with positive JAK2 mutation.  The patient also has anemia and leukocytosis secondary to the myeloproliferative disorder. Over the last 6 weeks she was on treatment with hydroxyurea 500 mg p.o. daily except on Monday and Thursday she will be on 1000 mg.  She has been tolerating this treatment well but repeat CBC today showed persistent  thrombocytosis with platelets count of 617,000 in addition to anemia with low hemoglobin of 7.9. I recommended for the patient to change her treatment to hydroxyurea 500 mg p.o. daily but I will add anagrelide 0.5 mg p.o. daily to help with the elevated platelet count without affecting her hemoglobin and red blood cells. I will see her back for follow-up visit in 1 months for evaluation and repeat blood work. The patient was advised to call immediately if she has any other concerning symptoms in the interval.  The patient voices understanding of current disease status and treatment options and is in agreement with the current care plan.  All questions were answered. The patient knows to call the clinic with any problems, questions or concerns. We can certainly see the patient much sooner if necessary.  Disclaimer: This note was dictated with voice recognition software. Similar sounding words can inadvertently be transcribed and may not be corrected upon review.

## 2022-06-01 ENCOUNTER — Inpatient Hospital Stay (HOSPITAL_COMMUNITY)
Admission: EM | Admit: 2022-06-01 | Discharge: 2022-06-06 | DRG: 548 | Disposition: A | Discharge status: Skilled Nursing Facility | Payer: Medicare Other | Attending: Internal Medicine | Admitting: Internal Medicine

## 2022-06-01 ENCOUNTER — Emergency Department (HOSPITAL_COMMUNITY): Payer: Medicare Other

## 2022-06-01 ENCOUNTER — Encounter (HOSPITAL_COMMUNITY): Payer: Self-pay

## 2022-06-01 ENCOUNTER — Other Ambulatory Visit: Payer: Self-pay

## 2022-06-01 DIAGNOSIS — Z91018 Allergy to other foods: Secondary | ICD-10-CM

## 2022-06-01 DIAGNOSIS — E119 Type 2 diabetes mellitus without complications: Secondary | ICD-10-CM | POA: Diagnosis present

## 2022-06-01 DIAGNOSIS — M6281 Muscle weakness (generalized): Secondary | ICD-10-CM | POA: Diagnosis not present

## 2022-06-01 DIAGNOSIS — Z7984 Long term (current) use of oral hypoglycemic drugs: Secondary | ICD-10-CM | POA: Diagnosis not present

## 2022-06-01 DIAGNOSIS — D72819 Decreased white blood cell count, unspecified: Secondary | ICD-10-CM | POA: Diagnosis present

## 2022-06-01 DIAGNOSIS — Z7901 Long term (current) use of anticoagulants: Secondary | ICD-10-CM | POA: Diagnosis not present

## 2022-06-01 DIAGNOSIS — Z86718 Personal history of other venous thrombosis and embolism: Secondary | ICD-10-CM

## 2022-06-01 DIAGNOSIS — D471 Chronic myeloproliferative disease: Secondary | ICD-10-CM | POA: Diagnosis present

## 2022-06-01 DIAGNOSIS — L8990 Pressure ulcer of unspecified site, unspecified stage: Secondary | ICD-10-CM | POA: Insufficient documentation

## 2022-06-01 DIAGNOSIS — L89311 Pressure ulcer of right buttock, stage 1: Secondary | ICD-10-CM | POA: Diagnosis present

## 2022-06-01 DIAGNOSIS — M1611 Unilateral primary osteoarthritis, right hip: Secondary | ICD-10-CM | POA: Diagnosis present

## 2022-06-01 DIAGNOSIS — I2510 Atherosclerotic heart disease of native coronary artery without angina pectoris: Secondary | ICD-10-CM | POA: Diagnosis present

## 2022-06-01 DIAGNOSIS — Z8249 Family history of ischemic heart disease and other diseases of the circulatory system: Secondary | ICD-10-CM

## 2022-06-01 DIAGNOSIS — I252 Old myocardial infarction: Secondary | ICD-10-CM

## 2022-06-01 DIAGNOSIS — K219 Gastro-esophageal reflux disease without esophagitis: Secondary | ICD-10-CM | POA: Diagnosis present

## 2022-06-01 DIAGNOSIS — M7551 Bursitis of right shoulder: Secondary | ICD-10-CM | POA: Diagnosis present

## 2022-06-01 DIAGNOSIS — R9431 Abnormal electrocardiogram [ECG] [EKG]: Secondary | ICD-10-CM | POA: Diagnosis not present

## 2022-06-01 DIAGNOSIS — Z91040 Latex allergy status: Secondary | ICD-10-CM

## 2022-06-01 DIAGNOSIS — R0902 Hypoxemia: Secondary | ICD-10-CM | POA: Diagnosis present

## 2022-06-01 DIAGNOSIS — Z885 Allergy status to narcotic agent status: Secondary | ICD-10-CM

## 2022-06-01 DIAGNOSIS — M00251 Other streptococcal arthritis, right hip: Secondary | ICD-10-CM | POA: Diagnosis not present

## 2022-06-01 DIAGNOSIS — R69 Illness, unspecified: Secondary | ICD-10-CM

## 2022-06-01 DIAGNOSIS — M009 Pyogenic arthritis, unspecified: Secondary | ICD-10-CM | POA: Diagnosis not present

## 2022-06-01 DIAGNOSIS — D630 Anemia in neoplastic disease: Secondary | ICD-10-CM | POA: Diagnosis present

## 2022-06-01 DIAGNOSIS — E785 Hyperlipidemia, unspecified: Secondary | ICD-10-CM | POA: Diagnosis present

## 2022-06-01 DIAGNOSIS — R4182 Altered mental status, unspecified: Secondary | ICD-10-CM | POA: Diagnosis not present

## 2022-06-01 DIAGNOSIS — M00851 Arthritis due to other bacteria, right hip: Secondary | ICD-10-CM | POA: Diagnosis not present

## 2022-06-01 DIAGNOSIS — D649 Anemia, unspecified: Secondary | ICD-10-CM

## 2022-06-01 DIAGNOSIS — I10 Essential (primary) hypertension: Secondary | ICD-10-CM | POA: Diagnosis present

## 2022-06-01 DIAGNOSIS — J439 Emphysema, unspecified: Secondary | ICD-10-CM | POA: Diagnosis not present

## 2022-06-01 DIAGNOSIS — D473 Essential (hemorrhagic) thrombocythemia: Secondary | ICD-10-CM | POA: Diagnosis present

## 2022-06-01 DIAGNOSIS — Z881 Allergy status to other antibiotic agents status: Secondary | ICD-10-CM

## 2022-06-01 DIAGNOSIS — R413 Other amnesia: Secondary | ICD-10-CM

## 2022-06-01 DIAGNOSIS — Z79899 Other long term (current) drug therapy: Secondary | ICD-10-CM | POA: Diagnosis not present

## 2022-06-01 DIAGNOSIS — G40909 Epilepsy, unspecified, not intractable, without status epilepticus: Secondary | ICD-10-CM | POA: Diagnosis present

## 2022-06-01 DIAGNOSIS — R269 Unspecified abnormalities of gait and mobility: Secondary | ICD-10-CM | POA: Diagnosis present

## 2022-06-01 DIAGNOSIS — G928 Other toxic encephalopathy: Secondary | ICD-10-CM | POA: Diagnosis not present

## 2022-06-01 DIAGNOSIS — M25551 Pain in right hip: Secondary | ICD-10-CM | POA: Diagnosis not present

## 2022-06-01 DIAGNOSIS — Z886 Allergy status to analgesic agent status: Secondary | ICD-10-CM

## 2022-06-01 DIAGNOSIS — M25451 Effusion, right hip: Secondary | ICD-10-CM | POA: Diagnosis present

## 2022-06-01 DIAGNOSIS — R262 Difficulty in walking, not elsewhere classified: Secondary | ICD-10-CM

## 2022-06-01 DIAGNOSIS — Z882 Allergy status to sulfonamides status: Secondary | ICD-10-CM

## 2022-06-01 DIAGNOSIS — F05 Delirium due to known physiological condition: Secondary | ICD-10-CM | POA: Diagnosis not present

## 2022-06-01 DIAGNOSIS — D75839 Thrombocytosis, unspecified: Secondary | ICD-10-CM | POA: Diagnosis not present

## 2022-06-01 DIAGNOSIS — R2681 Unsteadiness on feet: Secondary | ICD-10-CM | POA: Diagnosis not present

## 2022-06-01 DIAGNOSIS — Z880 Allergy status to penicillin: Secondary | ICD-10-CM

## 2022-06-01 DIAGNOSIS — G9341 Metabolic encephalopathy: Secondary | ICD-10-CM | POA: Diagnosis not present

## 2022-06-01 DIAGNOSIS — Z955 Presence of coronary angioplasty implant and graft: Secondary | ICD-10-CM

## 2022-06-01 DIAGNOSIS — Z87898 Personal history of other specified conditions: Secondary | ICD-10-CM

## 2022-06-01 LAB — COMPREHENSIVE METABOLIC PANEL
ALT: 11 U/L (ref 0–44)
AST: 20 U/L (ref 15–41)
Albumin: 3.6 g/dL (ref 3.5–5.0)
Alkaline Phosphatase: 34 U/L — ABNORMAL LOW (ref 38–126)
Anion gap: 6 (ref 5–15)
BUN: 16 mg/dL (ref 8–23)
CO2: 25 mmol/L (ref 22–32)
Calcium: 8.8 mg/dL — ABNORMAL LOW (ref 8.9–10.3)
Chloride: 108 mmol/L (ref 98–111)
Creatinine, Ser: 0.89 mg/dL (ref 0.44–1.00)
GFR, Estimated: >60 mL/min (ref >60)
Glucose, Bld: 117 mg/dL — ABNORMAL HIGH (ref 70–99)
Potassium: 3.9 mmol/L (ref 3.5–5.1)
Sodium: 139 mmol/L (ref 135–145)
Total Bilirubin: 0.9 mg/dL (ref 0.3–1.2)
Total Protein: 5.7 g/dL — ABNORMAL LOW (ref 6.5–8.1)

## 2022-06-01 LAB — CBC WITH DIFFERENTIAL/PLATELET
Abs Immature Granulocytes: 0.05 10*3/uL (ref 0.00–0.07)
Basophils Absolute: 0 10*3/uL (ref 0.0–0.1)
Basophils Relative: 0 %
Eosinophils Absolute: 0 10*3/uL (ref 0.0–0.5)
Eosinophils Relative: 1 %
HCT: 25.7 % — ABNORMAL LOW (ref 36.0–46.0)
Hemoglobin: 7.9 g/dL — ABNORMAL LOW (ref 12.0–15.0)
Immature Granulocytes: 1 %
Lymphocytes Relative: 7 %
Lymphs Abs: 0.4 10*3/uL — ABNORMAL LOW (ref 0.7–4.0)
MCH: 34.1 pg — ABNORMAL HIGH (ref 26.0–34.0)
MCHC: 30.7 g/dL (ref 30.0–36.0)
MCV: 110.8 fL — ABNORMAL HIGH (ref 80.0–100.0)
Monocytes Absolute: 0.3 10*3/uL (ref 0.1–1.0)
Monocytes Relative: 5 %
Neutro Abs: 5.3 10*3/uL (ref 1.7–7.7)
Neutrophils Relative %: 86 %
Platelets: 791 10*3/uL — ABNORMAL HIGH (ref 150–400)
RBC: 2.32 MIL/uL — ABNORMAL LOW (ref 3.87–5.11)
RDW: 21 % — ABNORMAL HIGH (ref 11.5–15.5)
WBC: 6.1 10*3/uL (ref 4.0–10.5)
nRBC: 0.3 % — ABNORMAL HIGH (ref 0.0–0.2)

## 2022-06-01 LAB — LACTIC ACID, PLASMA
Lactic Acid, Venous: 1 mmol/L (ref 0.5–1.9)
Lactic Acid, Venous: 1 mmol/L (ref 0.5–1.9)

## 2022-06-01 LAB — C-REACTIVE PROTEIN: CRP: 1.4 mg/dL — ABNORMAL HIGH (ref <1.0)

## 2022-06-01 LAB — SEDIMENTATION RATE: Sed Rate: 14 mm/hr (ref 0–22)

## 2022-06-01 LAB — CK: Total CK: 24 U/L — ABNORMAL LOW (ref 38–234)

## 2022-06-01 MED ORDER — LACTATED RINGERS IV SOLN: INTRAVENOUS | Status: DC

## 2022-06-01 MED ORDER — LORAZEPAM 2 MG/ML IJ SOLN
0.5000 mg | Freq: Once | INTRAMUSCULAR | Status: AC | PRN
  Administered 2022-06-01: 0.5 mg via INTRAVENOUS
  Filled 2022-06-01: qty 1

## 2022-06-01 MED ORDER — HYDROMORPHONE HCL 1 MG/ML IJ SOLN
1.0000 mg | Freq: Once | INTRAMUSCULAR | Status: AC
  Administered 2022-06-01: 1 mg via INTRAVENOUS
  Filled 2022-06-01: qty 1

## 2022-06-01 MED ORDER — HYDROMORPHONE HCL 1 MG/ML IJ SOLN
1.0000 mg | INTRAMUSCULAR | Status: DC | PRN
  Administered 2022-06-01 – 2022-06-02 (×3): 1 mg via INTRAVENOUS
  Filled 2022-06-01 (×3): qty 1

## 2022-06-01 MED ORDER — GADOBUTROL 1 MMOL/ML IV SOLN
5.3000 mL | Freq: Once | INTRAVENOUS | Status: AC | PRN
  Administered 2022-06-01: 5.3 mL via INTRAVENOUS

## 2022-06-01 NOTE — ED Triage Notes (Signed)
Pt coming from home via [facility] for increased pain in her leg that started 2 days ago and has progressively gotten worse. Today it was difficult to stand/walk. 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 Recently has had increased WBC. '6mg'$  Morphine and 1000 mg tylenol PTA  BP 128/80 CBG 124 Temp 100.1

## 2022-06-01 NOTE — ED Provider Notes (Signed)
[facility] EMERGENCY DEPARTMENT Provider Note   CSN: [id number] Arrival date & time: 06/01/22  1216     History  Chief Complaint  Patient presents with   Groin Pain   Leg Pain    [name] [name] is a 76 y.o. female.  HPI  Essential thrombocythemia with positive JAK2 mutation [id number] diagnosed in October 2020.  Patient has anemia and leukocytosis secondary to myeloproliferative disorder .   CAD status post stent to LAD and left circumflex, diabetes type 2, DVT, elevated LFTs, hyperlipidemia, hypertension, memory loss, MI.  Patient had been on hydroxyurea with persistent thrombocytosis as a visit with Dr. [name] [phone].  He still had persistent thrombocytosis with platelet count of 617 K and anemia of 7.9.  At that time recommendation was for hydroxyurea 500 mg daily and the addition of anagrelide 0.5 mg daily.   Patient started having some hip and groin pain 2 days ago.  It was not too severe.  It has escalated significantly today.  Patient now has severe pain she indicates her hip below the iliac and wrapping around to her medial thigh.  Pain is unbearable.  Is worse with movements.  She has been unable to stand and walk on the leg.  No injury that she recalls.  She has occasionally had problems with back pain but not chronically and never leg or hip pain severe like this.   Home Medications Prior to Admission medications   Medication Sig Start Date End Date Taking? Authorizing Provider  anagrelide (AGRYLIN) 0.5 MG capsule Take 1 capsule (0.5 mg total) by mouth daily. 05/20/22   Curt Bears, MD  fenofibrate micronized (LOFIBRA) 200 MG capsule TAKE ONE CAPSULE BY MOUTH ONCE DAILY BEFORE BREAKFAST 05/18/17   Nahser, Wonda Cheng, MD  ferrous sulfate 325 (65 FE) MG EC tablet Take 325 mg by mouth as needed.    [provider]  Fish Oil-Cholecalciferol (FISH OIL + D3) 1000-1000 MG-UNIT CAPS Take 1 tablet by mouth 3 (three) times daily.    [provider]  hydroxyurea (HYDREA) 500 MG capsule TAKE 1 CAPSULE BY MOUTH ONCE DAILY. MAY TAKE WITH FOOD TO  MINIMIZE  GI  SIDE  EFFECTS. 03/11/22   Curt Bears, MD  levETIRAcetam (KEPPRA) 250 MG tablet Take 1 tablet (250 mg total) by mouth 2 (two) times daily. 08/14/21   Alric Ran, MD  metFORMIN (GLUCOPHAGE) 500 MG tablet Take 1 tablet by mouth daily. 10/16/18   [provider]  nitroGLYCERIN (NITROSTAT) 0.4 MG SL tablet Place 1 tablet (0.4 mg total) under the tongue every 5 (five) minutes as needed. For chest pain. 01/04/20   Nahser, Wonda Cheng, MD  omeprazole (PRILOSEC) 40 MG capsule Take by mouth. 01/09/22   [provider]  prochlorperazine (COMPAZINE) 10 MG tablet Take 1 tablet (10 mg total) by mouth every 6 (six) hours as needed for nausea or vomiting. 10/05/19   Heilingoetter, Cassandra L, PA-C  rosuvastatin (CRESTOR) 20 MG tablet Take 10 mg by mouth daily.    [provider]  warfarin (COUMADIN) 2 MG tablet Take 2 mg by mouth daily.    [name], MD  Zinc 25 MG TABS Take 1 tablet by mouth 2 (two) times daily.    [provider]      Allergies    Ampicillin, Avelox [moxifloxacin hcl in nacl], Bactrim [sulfamethoxazole-trimethoprim], Ibuprofen, Penicillins, Diphenhydramine, Sulfa antibiotics, Codeine, Latex, and Whey    Review of Systems   Review of Systems 10 systems reviewed  negative except as per HPI Physical Exam Updated Vital Signs BP (!) 129/48   Pulse 72   Temp 99.4 F (37.4 C) (Oral)   Resp 15   Ht '5\' 2"'$  (1.575 m)   Wt 53.1 kg   SpO2 97%   BMI 21.40 kg/m  Physical Exam Constitutional:      Comments: Patient is alert, extremely anxious in pain.  No respiratory distress.  Nontoxic  HENT:     Head: Normocephalic and atraumatic.     Mouth/Throat:     Pharynx: Oropharynx is clear.  Eyes:     Extraocular Movements: Extraocular movements intact.  Cardiovascular:     Rate and Rhythm: Normal rate and regular rhythm.  Pulmonary:     Effort:  Pulmonary effort is normal.     Breath sounds: Normal breath sounds.  Abdominal:     General: There is no distension.     Palpations: Abdomen is soft.     Tenderness: There is no abdominal tenderness. There is no guarding.  Musculoskeletal:     Comments: Visible soft tissue abnormality around the groin hip or pelvis.  No rash in the groin fold.  No mass or fullness in the groin fold.  Femoral pulses 2+..  Patient winces and is tearful with extreme pain with any movement of the hip.  Both feet are warm and dry.  Dorsalis pedis pulses 2+ and strong.  Patient does appear to have about 1-2+ edema around the ankles and feet.  Edema is symmetric in appearance.  Skin:    General: Skin is warm and dry.  Neurological:     Comments: Patient is alert.  Speech is clear.  No focal motor deficits.     ED Results / Procedures / Treatments   Labs (all labs ordered are listed, but only abnormal results are displayed) Labs Reviewed  COMPREHENSIVE METABOLIC PANEL - Abnormal; Notable for the following components:      Result Value   Glucose, Bld 117 (*)    Calcium 8.8 (*)    Total Protein 5.7 (*)    Alkaline Phosphatase 34 (*)    All other components within normal limits  CBC WITH DIFFERENTIAL/PLATELET - Abnormal; Notable for the following components:   RBC 2.32 (*)    Hemoglobin 7.9 (*)    HCT 25.7 (*)    MCV 110.8 (*)    MCH 34.1 (*)    RDW 21.0 (*)    Platelets 791 (*)    nRBC 0.3 (*)    Lymphs Abs 0.4 (*)    All other components within normal limits  CK - Abnormal; Notable for the following components:   Total CK 24 (*)    All other components within normal limits  C-REACTIVE PROTEIN - Abnormal; Notable for the following components:   CRP 1.4 (*)    All other components within normal limits  CULTURE, BLOOD (ROUTINE X 2)  CULTURE, BLOOD (ROUTINE X 2)  LACTIC ACID, PLASMA  LACTIC ACID, PLASMA  SEDIMENTATION RATE  PROTIME-INR    EKG EKG Interpretation  Date/Time:  Sunday June 01 2022 12:26:43 EDT Ventricular Rate:  73 PR Interval:  177 QRS Duration: 111 QT Interval:  357 QTC Calculation: 394 R Axis:   50 Text Interpretation: Sinus rhythm Low voltage, extremity leads no sig change from previous Confirmed by Charlesetta Shanks [phone]) on 06/01/2022 2:08:15 PM  Radiology DG Hip Unilat W or Wo Pelvis 2-3 Views Right  Result Date: 06/01/2022 CLINICAL DATA:  Right hip pain  EXAM: DG HIP (WITH OR WITHOUT PELVIS) 2-3V RIGHT COMPARISON:  07/13/2014 FINDINGS: Severe degenerative changes of the right hip joint, progressed from 2015. No evidence of fracture or dislocation. No erosion or periosteal elevation is seen. No appreciable soft tissue abnormality. IMPRESSION: Severe degenerative changes of the right hip, progressed from 2015. Electronically Signed   By: Davina Poke D.O.   On: 06/01/2022 14:49    Procedures Procedures    Medications Ordered in ED Medications  lactated ringers infusion ( Intravenous New Bag/Given 06/01/22 1353)  LORazepam (ATIVAN) injection 0.5 mg (has no administration in time range)  HYDROmorphone (DILAUDID) injection 1 mg (1 mg Intravenous Given 06/01/22 1350)    ED Course/ Medical Decision Making/ A&P                           Medical Decision Making Amount and/or Complexity of Data Reviewed Labs: ordered. Radiology: ordered.  Risk Prescription drug management.   Patient has complex medical history as outlined above.  Notable risk factors for complicated musculoskeletal or vascular compromise.  No reported fall.  Patient is in severe pain.  First exam shows normal pulses making arterial obstruction very low probability.  Patient does have history of DVT however pain is much more concentrated up in the hip and groin without calf tenderness.  At this time I have concern for pathological fracture or possibly bone infarct.  We will proceed with pain control.  Dilaudid 1 mg IV given.  And lactated Ringer's fluid resuscitation initiated.  14: 05  recheck patient is much better pain control at this time.  We will proceed with ordering plain film x-rays first to assess for possible fracture.  Advanced imaging may be needed.  I have examined x-rays and reviewed radiology's interpretation as well.  I have examined hip and pelvis x-rays and there is extensive degenerative appearance of the right hip.  Per radiology interpretation no visible occult fracture at this time.  15: 45: Patient continues to be well pain control.  This time we will proceed with MRI with and without contrast to further evaluate the hip for possible occult fracture\infarct\hemarthrosis.  Dr. Billy Fischer will continue care at shift change for remainder of evaluation and disposition.        Final Clinical Impression(s) / ED Diagnoses Final diagnoses:  Acute right hip pain  Severe comorbid illness    Rx / DC Orders ED Discharge Orders     None         Charlesetta Shanks, MD 06/01/22 1547

## 2022-06-01 NOTE — ED Provider Notes (Signed)
  Physical Exam  BP (!) 129/48   Pulse 72   Temp 99.4 F (37.4 C) (Oral)   Resp 15   Ht '5\' 2"'$  (1.575 m)   Wt 53.1 kg   SpO2 97%   BMI 21.40 kg/m   Physical Exam  Procedures  Procedures  ED Course / MDM    Medical Decision Making Amount and/or Complexity of Data Reviewed Labs: ordered. Radiology: ordered.  Risk Prescription drug management.   76 year old female with a history of essential thrombocythemia, anemia and leukocytosis secondary to myeloproliferative disorder, who presents with concern for severe hip pain.

## 2022-06-02 ENCOUNTER — Encounter (HOSPITAL_COMMUNITY): Payer: Self-pay | Admitting: Internal Medicine

## 2022-06-02 ENCOUNTER — Observation Stay (HOSPITAL_COMMUNITY): Payer: Medicare Other

## 2022-06-02 DIAGNOSIS — F05 Delirium due to known physiological condition: Secondary | ICD-10-CM | POA: Diagnosis not present

## 2022-06-02 DIAGNOSIS — M1611 Unilateral primary osteoarthritis, right hip: Secondary | ICD-10-CM | POA: Diagnosis present

## 2022-06-02 DIAGNOSIS — Z79899 Other long term (current) drug therapy: Secondary | ICD-10-CM | POA: Diagnosis not present

## 2022-06-02 DIAGNOSIS — R0902 Hypoxemia: Secondary | ICD-10-CM | POA: Diagnosis present

## 2022-06-02 DIAGNOSIS — Z87898 Personal history of other specified conditions: Secondary | ICD-10-CM

## 2022-06-02 DIAGNOSIS — Z7984 Long term (current) use of oral hypoglycemic drugs: Secondary | ICD-10-CM | POA: Diagnosis not present

## 2022-06-02 DIAGNOSIS — R413 Other amnesia: Secondary | ICD-10-CM

## 2022-06-02 DIAGNOSIS — E119 Type 2 diabetes mellitus without complications: Secondary | ICD-10-CM | POA: Diagnosis present

## 2022-06-02 DIAGNOSIS — D72819 Decreased white blood cell count, unspecified: Secondary | ICD-10-CM | POA: Diagnosis present

## 2022-06-02 DIAGNOSIS — D473 Essential (hemorrhagic) thrombocythemia: Secondary | ICD-10-CM | POA: Diagnosis present

## 2022-06-02 DIAGNOSIS — R4182 Altered mental status, unspecified: Secondary | ICD-10-CM | POA: Diagnosis not present

## 2022-06-02 DIAGNOSIS — R269 Unspecified abnormalities of gait and mobility: Secondary | ICD-10-CM | POA: Diagnosis present

## 2022-06-02 DIAGNOSIS — M6281 Muscle weakness (generalized): Secondary | ICD-10-CM | POA: Diagnosis not present

## 2022-06-02 DIAGNOSIS — G40909 Epilepsy, unspecified, not intractable, without status epilepticus: Secondary | ICD-10-CM | POA: Diagnosis present

## 2022-06-02 DIAGNOSIS — M009 Pyogenic arthritis, unspecified: Secondary | ICD-10-CM | POA: Diagnosis present

## 2022-06-02 DIAGNOSIS — Z7901 Long term (current) use of anticoagulants: Secondary | ICD-10-CM

## 2022-06-02 DIAGNOSIS — M25451 Effusion, right hip: Secondary | ICD-10-CM | POA: Diagnosis present

## 2022-06-02 DIAGNOSIS — D75839 Thrombocytosis, unspecified: Secondary | ICD-10-CM | POA: Diagnosis not present

## 2022-06-02 DIAGNOSIS — M7551 Bursitis of right shoulder: Secondary | ICD-10-CM | POA: Diagnosis present

## 2022-06-02 DIAGNOSIS — D471 Chronic myeloproliferative disease: Secondary | ICD-10-CM | POA: Diagnosis present

## 2022-06-02 DIAGNOSIS — D630 Anemia in neoplastic disease: Secondary | ICD-10-CM | POA: Diagnosis present

## 2022-06-02 DIAGNOSIS — R2681 Unsteadiness on feet: Secondary | ICD-10-CM | POA: Diagnosis not present

## 2022-06-02 DIAGNOSIS — J439 Emphysema, unspecified: Secondary | ICD-10-CM | POA: Diagnosis not present

## 2022-06-02 DIAGNOSIS — M00251 Other streptococcal arthritis, right hip: Secondary | ICD-10-CM | POA: Diagnosis not present

## 2022-06-02 DIAGNOSIS — G9341 Metabolic encephalopathy: Secondary | ICD-10-CM | POA: Diagnosis not present

## 2022-06-02 DIAGNOSIS — E785 Hyperlipidemia, unspecified: Secondary | ICD-10-CM | POA: Diagnosis present

## 2022-06-02 DIAGNOSIS — I2510 Atherosclerotic heart disease of native coronary artery without angina pectoris: Secondary | ICD-10-CM | POA: Diagnosis present

## 2022-06-02 DIAGNOSIS — G928 Other toxic encephalopathy: Secondary | ICD-10-CM | POA: Diagnosis not present

## 2022-06-02 DIAGNOSIS — Z955 Presence of coronary angioplasty implant and graft: Secondary | ICD-10-CM | POA: Diagnosis not present

## 2022-06-02 DIAGNOSIS — R262 Difficulty in walking, not elsewhere classified: Secondary | ICD-10-CM

## 2022-06-02 DIAGNOSIS — M25551 Pain in right hip: Secondary | ICD-10-CM

## 2022-06-02 DIAGNOSIS — I252 Old myocardial infarction: Secondary | ICD-10-CM | POA: Diagnosis not present

## 2022-06-02 DIAGNOSIS — L89311 Pressure ulcer of right buttock, stage 1: Secondary | ICD-10-CM | POA: Diagnosis present

## 2022-06-02 DIAGNOSIS — M00851 Arthritis due to other bacteria, right hip: Secondary | ICD-10-CM | POA: Diagnosis not present

## 2022-06-02 DIAGNOSIS — Z86718 Personal history of other venous thrombosis and embolism: Secondary | ICD-10-CM | POA: Diagnosis not present

## 2022-06-02 DIAGNOSIS — I10 Essential (primary) hypertension: Secondary | ICD-10-CM | POA: Diagnosis present

## 2022-06-02 DIAGNOSIS — K219 Gastro-esophageal reflux disease without esophagitis: Secondary | ICD-10-CM | POA: Diagnosis present

## 2022-06-02 DIAGNOSIS — D649 Anemia, unspecified: Secondary | ICD-10-CM | POA: Diagnosis not present

## 2022-06-02 LAB — GLUCOSE, CAPILLARY
Glucose-Capillary: 126 mg/dL — ABNORMAL HIGH (ref 70–99)
Glucose-Capillary: 167 mg/dL — ABNORMAL HIGH (ref 70–99)

## 2022-06-02 LAB — URINALYSIS, ROUTINE W REFLEX MICROSCOPIC
Bacteria, UA: NONE SEEN
Bilirubin Urine: NEGATIVE
Glucose, UA: NEGATIVE mg/dL
Hgb urine dipstick: NEGATIVE
Ketones, ur: 5 mg/dL — AB
Nitrite: NEGATIVE
Protein, ur: NEGATIVE mg/dL
Specific Gravity, Urine: 1.016 (ref 1.005–1.030)
pH: 6 (ref 5.0–8.0)

## 2022-06-02 LAB — CBG MONITORING, ED
Glucose-Capillary: 95 mg/dL (ref 70–99)
Glucose-Capillary: 109 mg/dL — ABNORMAL HIGH (ref 70–99)
Glucose-Capillary: 106 mg/dL — ABNORMAL HIGH (ref 70–99)

## 2022-06-02 LAB — SYNOVIAL CELL COUNT + DIFF, W/ CRYSTALS
Crystals, Fluid: NONE SEEN
Eosinophils-Synovial: 0 % (ref 0–1)
Lymphocytes-Synovial Fld: 0 % (ref 0–20)
Monocyte-Macrophage-Synovial Fluid: 1 % — ABNORMAL LOW (ref 50–90)
Neutrophil, Synovial: 99 % — ABNORMAL HIGH (ref 0–25)
WBC, Synovial: 23600 /mm3 — ABNORMAL HIGH (ref 0–200)

## 2022-06-02 LAB — PROTIME-INR
INR: 3.7 — ABNORMAL HIGH (ref 0.8–1.2)
Prothrombin Time: 36.4 seconds — ABNORMAL HIGH (ref 11.4–15.2)

## 2022-06-02 LAB — HEMOGLOBIN A1C
Hgb A1c MFr Bld: 5.6 % (ref 4.8–5.6)
Mean Plasma Glucose: 114.02 mg/dL

## 2022-06-02 MED ORDER — LIDOCAINE HCL (PF) 1 % IJ SOLN
5.0000 mL | Freq: Once | INTRAMUSCULAR | Status: AC
  Administered 2022-06-02: 5 mL via INTRADERMAL

## 2022-06-02 MED ORDER — WARFARIN - PHARMACIST DOSING INPATIENT: Freq: Every day | Status: DC

## 2022-06-02 MED ORDER — ACETAMINOPHEN 325 MG PO TABS
650.0000 mg | ORAL_TABLET | Freq: Four times a day (QID) | ORAL | Status: DC | PRN
Start: 2022-06-02 — End: 2022-06-02

## 2022-06-02 MED ORDER — BUPIVACAINE HCL (PF) 0.25 % IJ SOLN
INTRAMUSCULAR | Status: AC
  Filled 2022-06-02: qty 30

## 2022-06-02 MED ORDER — ONDANSETRON HCL 4 MG PO TABS: 4.0000 mg | ORAL_TABLET | Freq: Four times a day (QID) | ORAL | Status: DC | PRN

## 2022-06-02 MED ORDER — BUPIVACAINE HCL (PF) 0.25 % IJ SOLN
5.0000 mL | Freq: Once | INTRAMUSCULAR | Status: AC
  Administered 2022-06-02: 5 mL

## 2022-06-02 MED ORDER — METHYLPREDNISOLONE ACETATE 80 MG/ML IJ SUSP
INTRAMUSCULAR | Status: AC
  Filled 2022-06-02: qty 1

## 2022-06-02 MED ORDER — ROSUVASTATIN CALCIUM 5 MG PO TABS
10.0000 mg | ORAL_TABLET | Freq: Every evening | ORAL | Status: DC
  Administered 2022-06-03 – 2022-06-05 (×3): 10 mg via ORAL
  Filled 2022-06-02 (×4): qty 2

## 2022-06-02 MED ORDER — INSULIN ASPART 100 UNIT/ML IJ SOLN
0.0000 [IU] | Freq: Three times a day (TID) | INTRAMUSCULAR | Status: DC
  Administered 2022-06-03 (×2): 2 [IU] via SUBCUTANEOUS
  Administered 2022-06-04: 3 [IU] via SUBCUTANEOUS
  Administered 2022-06-05: 2 [IU] via SUBCUTANEOUS
  Administered 2022-06-05: 8 [IU] via SUBCUTANEOUS

## 2022-06-02 MED ORDER — PANTOPRAZOLE SODIUM 40 MG PO TBEC
40.0000 mg | DELAYED_RELEASE_TABLET | Freq: Every day | ORAL | Status: DC
  Administered 2022-06-02 – 2022-06-06 (×5): 40 mg via ORAL
  Filled 2022-06-02 (×5): qty 1

## 2022-06-02 MED ORDER — LEVETIRACETAM 250 MG PO TABS
250.0000 mg | ORAL_TABLET | Freq: Two times a day (BID) | ORAL | Status: DC
  Administered 2022-06-02 – 2022-06-06 (×9): 250 mg via ORAL
  Filled 2022-06-02 (×10): qty 1

## 2022-06-02 MED ORDER — OXYCODONE HCL 5 MG PO TABS
5.0000 mg | ORAL_TABLET | Freq: Four times a day (QID) | ORAL | Status: DC | PRN
  Administered 2022-06-03 – 2022-06-04 (×3): 5 mg via ORAL
  Filled 2022-06-02 (×3): qty 1

## 2022-06-02 MED ORDER — ONDANSETRON HCL 4 MG/2ML IJ SOLN: 4.0000 mg | Freq: Four times a day (QID) | INTRAMUSCULAR | Status: DC | PRN

## 2022-06-02 MED ORDER — IOHEXOL 180 MG/ML  SOLN
10.0000 mL | Freq: Once | INTRAMUSCULAR | Status: AC | PRN
  Administered 2022-06-02: 10 mL via INTRA_ARTICULAR

## 2022-06-02 MED ORDER — METHYLPREDNISOLONE ACETATE 40 MG/ML INJ SUSP (RADIOLOG
80.0000 mg | Freq: Once | INTRAMUSCULAR | Status: AC
  Administered 2022-06-02: 80 mg via INTRA_ARTICULAR

## 2022-06-02 MED ORDER — MORPHINE SULFATE (PF) 2 MG/ML IV SOLN: 2.0000 mg | INTRAVENOUS | Status: DC | PRN

## 2022-06-02 MED ORDER — MORPHINE SULFATE (PF) 2 MG/ML IV SOLN: 1.0000 mg | INTRAVENOUS | Status: DC | PRN

## 2022-06-02 MED ORDER — ANAGRELIDE HCL 0.5 MG PO CAPS
0.5000 mg | ORAL_CAPSULE | Freq: Every day | ORAL | Status: DC
  Administered 2022-06-03 – 2022-06-06 (×4): 0.5 mg via ORAL
  Filled 2022-06-02 (×5): qty 1

## 2022-06-02 MED ORDER — GADOBUTROL 1 MMOL/ML IV SOLN: 0.0500 mL | Freq: Once | INTRAVENOUS | Status: DC | PRN

## 2022-06-02 MED ORDER — ACETAMINOPHEN 500 MG PO TABS
1000.0000 mg | ORAL_TABLET | Freq: Three times a day (TID) | ORAL | Status: DC
  Administered 2022-06-02 – 2022-06-06 (×13): 1000 mg via ORAL
  Filled 2022-06-02 (×13): qty 2

## 2022-06-02 MED ORDER — ACETAMINOPHEN 650 MG RE SUPP
650.0000 mg | Freq: Four times a day (QID) | RECTAL | Status: DC | PRN
Start: 2022-06-02 — End: 2022-06-02

## 2022-06-02 MED ORDER — INSULIN ASPART 100 UNIT/ML IJ SOLN: 0.0000 [IU] | Freq: Every day | INTRAMUSCULAR | Status: DC

## 2022-06-02 NOTE — Evaluation (Signed)
Occupational Therapy Evaluation Patient Details Name: [name] [name] MRN: [id number] DOB: 19-Feb-1946 Today's Date: 06/02/2022   History of Present Illness Pt is a 76 y/o female admitted secondary to intractable R hip pain. Imaging negative. PMH includes thrombocythemia (positive JAK2 mutation), CAD s/p PCI, DM-2, HTN,   Clinical Impression   PTA, pt was living alone and was independent with ADLs and IADLs; family very supportive and checking in regularly. Pt currently requiring Min A for UB ADLs, Max A for LB ADLs, and Mod-Max A for functional mobility. Pt only performing sit<>stand with RW and side steps with two person hand held towards [facility] demonstrating posterior lean, lethargy, and poor strength. Pt able to weight bear through RLE but received dilaudid right before session. Pt would benefit from further acute OT to facilitate safe dc. Recommend dc to SNF for further OT to optimize safety, independence with ADLs, and return to PLOF.       Recommendations for follow up therapy are one component of a multi-disciplinary discharge planning process, led by the attending physician.  Recommendations may be updated based on patient status, additional functional criteria and insurance authorization.   Follow Up Recommendations  Skilled nursing-short term rehab (<3 hours/day)    Assistance Recommended at Discharge Frequent or constant Supervision/Assistance  Patient can return home with the following A lot of help with walking and/or transfers;A lot of help with bathing/dressing/bathroom    Functional Status Assessment  Patient has had a recent decline in their functional status and demonstrates the ability to make significant improvements in function in a reasonable and predictable amount of time.  Equipment Recommendations  BSC/3in1    Recommendations for Other Services PT consult     Precautions / Restrictions Precautions Precautions: Fall Restrictions Weight Bearing Restrictions: No       Mobility Bed Mobility Overal bed mobility: Needs Assistance Bed Mobility: Supine to Sit, Sit to Supine     Supine to sit: Max assist, +2 for physical assistance Sit to supine: Max assist, +2 for physical assistance   General bed mobility comments: Max A +2 for trunk and LE assist. Posterior lean noted in sitting    Transfers Overall transfer level: Needs assistance Equipment used: Rolling walker (2 wheels), 2 person hand held assist Transfers: Sit to/from Stand Sit to Stand: Mod assist, +2 physical assistance           General transfer comment: Mod A +2 to stand and for steadying secondary to posterior lean. Used RW intially, however, pt responded better to bilateral HHA. Able to take side steps towards [facility] with mod A +2, but had difficulty bearing weight on RLE.      Balance Overall balance assessment: Needs assistance Sitting-balance support: Bilateral upper extremity supported, Feet supported Sitting balance-Leahy Scale: Poor Sitting balance - Comments: Reliant on at least min A for sitting balance secondary to posterior lean Postural control: Posterior lean Standing balance support: Bilateral upper extremity supported Standing balance-Leahy Scale: Poor Standing balance comment: posterior lean; reliant on UE and external support                           ADL either performed or assessed with clinical judgement   ADL Overall ADL's : Needs assistance/impaired Eating/Feeding: Minimal assistance;Bed level   Grooming: Wash/dry face;Min guard;Minimal assistance;Sitting Grooming Details (indicate cue type and reason): Min Guard-Min A for sitting balance. Upper Body Bathing: Minimal assistance;Sitting   Lower Body Bathing: Maximal assistance;Sit to/from stand  Upper Body Dressing : Minimal assistance;Sitting   Lower Body Dressing: Maximal assistance;Sit to/from stand   Toilet Transfer: Moderate assistance;+2 for physical assistance (sit<>stand at  EOB) Toilet Transfer Details (indicate cue type and reason): Mod A +2 for standing balance         Functional mobility during ADLs: Moderate assistance;+2 for physical assistance;Rolling walker (2 wheels);Maximal assistance (sit<>stand and side steps towards HOB) General ADL Comments: imparied functional performance due to lethargy, weakness, and decreased balance with posterior lean.     Vision Baseline Vision/History: 1 Wears glasses (reading glasses) Patient Visual Report: No change from baseline       Perception     Praxis      Pertinent Vitals/Pain Pain Assessment Pain Assessment: Faces Faces Pain Scale: Hurts even more Pain Location: R hip Pain Descriptors / Indicators: Grimacing, Guarding Pain Intervention(s): Monitored during session, Repositioned, Premedicated before session     Hand Dominance Right   Extremity/Trunk Assessment Upper Extremity Assessment Upper Extremity Assessment: Overall WFL for tasks assessed   Lower Extremity Assessment Lower Extremity Assessment: Defer to PT evaluation;RLE deficits/detail RLE Deficits / Details: Limited ROM at R hip and limited ability to weightbear.   Cervical / Trunk Assessment Cervical / Trunk Assessment: Kyphotic   Communication Communication Communication: No difficulties   Cognition Arousal/Alertness: Awake/alert, Lethargic, Suspect due to medications Behavior During Therapy: WFL for tasks assessed/performed Overall Cognitive Status: History of cognitive impairments - at baseline                                 General Comments: Able to follow cues; however, lethargic and difficulty forming sentences to     General Comments  Daughter, [name], present throughout    Exercises     Shoulder Instructions      Home Living Family/patient expects to be discharged to:: Private residence Living Arrangements: Alone Available Help at Discharge: Family Type of Home: House Home Access: Stairs to  enter Technical brewer of Steps: 4-5 Entrance Stairs-Rails: Right;Left Home Layout: One level     Bathroom Shower/Tub: Teacher, early years/pre: Standard     Home Equipment: Grab bars - tub/shower          Prior Functioning/Environment Prior Level of Function : Independent/Modified Independent                        OT Problem List: Decreased strength;Decreased range of motion;Decreased activity tolerance;Impaired balance (sitting and/or standing);Decreased knowledge of use of DME or AE;Decreased knowledge of precautions;Pain      OT Treatment/Interventions: Self-care/ADL training;Therapeutic exercise;DME and/or AE instruction;Energy conservation;Therapeutic activities;Patient/family education    OT Goals(Current goals can be found in the care plan section) Acute Rehab OT Goals Patient Stated Goal: Get stronger and reduce pain OT Goal Formulation: With patient/family Time For Goal Achievement: 06/16/22 Potential to Achieve Goals: Good  OT Frequency: Min 2X/week    Co-evaluation PT/OT/SLP Co-Evaluation/Treatment: Yes Reason for Co-Treatment: For patient/therapist safety;To address functional/ADL transfers PT goals addressed during session: Mobility/safety with mobility;Balance OT goals addressed during session: ADL's and self-care      AM-PAC OT "6 Clicks" Daily Activity     Outcome Measure Help from another person eating meals?: A Little Help from another person taking care of personal grooming?: A Little Help from another person toileting, which includes using toliet, bedpan, or urinal?: A Lot Help from another person bathing (including washing, rinsing, drying)?: A  Lot Help from another person to put on and taking off regular upper body clothing?: A Little Help from another person to put on and taking off regular lower body clothing?: A Lot 6 Click Score: 15   End of Session Equipment Utilized During Treatment: Rolling walker (2 wheels);Gait  belt Nurse Communication: Mobility status  Activity Tolerance: Patient tolerated treatment well Patient left: in bed;with call bell/phone within reach;with family/visitor present  OT Visit Diagnosis: Unsteadiness on feet (R26.81);Other abnormalities of gait and mobility (R26.89);Muscle weakness (generalized) (M62.81);Pain Pain - Right/Left: Right Pain - part of body: Leg                Time: 2423-5361 OT Time Calculation (min): 18 min Charges:  OT General Charges $OT Visit: 1 Visit OT Evaluation $OT Eval Moderate Complexity: 1 Mod  Aamira Bischoff MSOT, OTR/L Acute Rehab Office: [place] 06/02/2022, 2:14 PM

## 2022-06-02 NOTE — Progress Notes (Signed)
ANTICOAGULATION CONSULT NOTE - Follow Up Consult  Pharmacy Consult for Warfarin  Indication:  history of DVT  Allergies  Allergen Reactions   Ampicillin Hives, Swelling and Rash   Avelox [Moxifloxacin Hcl In Nacl] Swelling   Bactrim [Sulfamethoxazole-Trimethoprim] Hives, Swelling and Rash   Ibuprofen Other (See Comments)    rash   Penicillins Other (See Comments)    rash   Diphenhydramine     Other reaction(s): rash   Sulfa Antibiotics     Other reaction(s): anaphylaxis   Codeine Rash    unknown   Latex Rash    unknown   Whey Rash    Patient Measurements: Height: '5\' 2"'$  (157.5 cm) Weight: 53.1 kg (117 lb) IBW/kg (Calculated) : 50.1  Vital Signs: Temp: 98.5 F (36.9 C) (07/30 2345) Temp Source: Oral (07/30 2345) BP: 119/49 (07/31 0115) Pulse Rate: 72 (07/31 0115)  Labs: Recent Labs    06/01/22 1355  HGB 7.9*  HCT 25.7*  PLT 791*  CREATININE 0.89  CKTOTAL 24*    Estimated Creatinine Clearance: 42.5 mL/min (by C-G formula based on SCr of 0.89 mg/dL).    Assessment: 76 yo female presented on 06/01/2022. Patient is on warfarin prior to admission for history of DVT and PE. Patient on warfarin '5mg'$  Monday and Friday and 2.'5mg'$  all other days per Care Everywhere note from 05/13/22. Patient's family reported last dose 7/29.   Goal of Therapy:  INR 2-3 Monitor platelets by anticoagulation protocol: Yes   Plan:  Follow up AM INR for warfarin dosing    Cristela Felt, PharmD, BCPS Clinical Pharmacist 06/02/2022 2:04 AM

## 2022-06-02 NOTE — ED Notes (Signed)
Reached out to unit to confirm receipt of messaging for report on this pt.

## 2022-06-02 NOTE — Evaluation (Signed)
Physical Therapy Evaluation Patient Details Name: [name] [name] MRN: [id number] DOB: 12-18-1945 Today's Date: 06/02/2022  History of Present Illness  Pt is a 76 y/o female admitted secondary to intractable R hip pain. Imaging negative. PMH includes thrombocythemia (positive JAK2 mutation), CAD s/p PCI, DM-2, HTN,  Clinical Impression  Pt admitted secondary to problem above with deficits below. Pt requiring max A +2 for med mobility and mod A +2 to stand and take side steps. Pt with posterior lean and increased pain with weightbearing, so mobility tolerance limited. Pt previously independent. Recommending SNF level therapies at d/c to address mobility deficits. Will continue to follow acutely.        Recommendations for follow up therapy are one component of a multi-disciplinary discharge planning process, led by the attending physician.  Recommendations may be updated based on patient status, additional functional criteria and insurance authorization.  Follow Up Recommendations Skilled nursing-short term rehab (<3 hours/day) Can patient physically be transported by private vehicle: No    Assistance Recommended at Discharge Frequent or constant Supervision/Assistance  Patient can return home with the following  Two people to help with walking and/or transfers;Two people to help with bathing/dressing/bathroom;Assistance with cooking/housework;Assist for transportation;Help with stairs or ramp for entrance    Equipment Recommendations Rolling walker (2 wheels);BSC/3in1  Recommendations for Other Services       Functional Status Assessment Patient has had a recent decline in their functional status and demonstrates the ability to make significant improvements in function in a reasonable and predictable amount of time.     Precautions / Restrictions Precautions Precautions: Fall Restrictions Weight Bearing Restrictions: No      Mobility  Bed Mobility Overal bed mobility: Needs  Assistance Bed Mobility: Supine to Sit, Sit to Supine     Supine to sit: Max assist, +2 for physical assistance Sit to supine: Max assist, +2 for physical assistance   General bed mobility comments: Max A +2 for trunk and LE assist. Posterior lean noted in sitting    Transfers Overall transfer level: Needs assistance Equipment used: Rolling walker (2 wheels), 2 person hand held assist Transfers: Sit to/from Stand Sit to Stand: Mod assist, +2 physical assistance           General transfer comment: Mod A +2 to stand and for steadying secondary to posterior lean. Used RW intially, however, pt responded better to bilateral HHA. Able to take side steps towards [facility] with mod A +2, but had difficulty bearing weight on RLE.    Ambulation/Gait                  Stairs            Wheelchair Mobility    Modified Rankin (Stroke Patients Only)       Balance Overall balance assessment: Needs assistance Sitting-balance support: Bilateral upper extremity supported, Feet supported Sitting balance-Leahy Scale: Poor Sitting balance - Comments: Reliant on at least min A for sitting balance secondary to posterior lean Postural control: Posterior lean Standing balance support: Bilateral upper extremity supported Standing balance-Leahy Scale: Poor Standing balance comment: posterior lean; reliant on UE and external support                             Pertinent Vitals/Pain Pain Assessment Pain Assessment: Faces Faces Pain Scale: Hurts even more Pain Location: R hip Pain Descriptors / Indicators: Grimacing, Guarding Pain Intervention(s): Limited activity within patient's tolerance, Monitored during session, Repositioned  Home Living Family/patient expects to be discharged to:: Private residence Living Arrangements: Alone Available Help at Discharge: Family Type of Home: House Home Access: Stairs to enter Entrance Stairs-Rails: Counsellor of Steps: 4-5   Home Layout: One level Home Equipment: Grab bars - tub/shower      Prior Function Prior Level of Function : Independent/Modified Independent                     Hand Dominance        Extremity/Trunk Assessment   Upper Extremity Assessment Upper Extremity Assessment: Defer to OT evaluation    Lower Extremity Assessment Lower Extremity Assessment: RLE deficits/detail RLE Deficits / Details: Limited ROM at R hip and limited ability to weightbear.    Cervical / Trunk Assessment Cervical / Trunk Assessment: Kyphotic  Communication   Communication: No difficulties  Cognition Arousal/Alertness: Awake/alert Behavior During Therapy: WFL for tasks assessed/performed Overall Cognitive Status: History of cognitive impairments - at baseline                                          General Comments      Exercises     Assessment/Plan    PT Assessment Patient needs continued PT services  PT Problem List Decreased range of motion;Decreased strength;Decreased activity tolerance;Decreased mobility;Decreased balance;Decreased knowledge of use of DME;Decreased knowledge of precautions       PT Treatment Interventions DME instruction;Gait training;Functional mobility training;Therapeutic exercise;Balance training;Therapeutic activities;Patient/family education    PT Goals (Current goals can be found in the Care Plan section)  Acute Rehab PT Goals Patient Stated Goal: to decrease pain PT Goal Formulation: With patient Time For Goal Achievement: 06/16/22 Potential to Achieve Goals: Good    Frequency Min 3X/week     Co-evaluation PT/OT/SLP Co-Evaluation/Treatment: Yes Reason for Co-Treatment: For patient/therapist safety;To address functional/ADL transfers PT goals addressed during session: Mobility/safety with mobility;Balance         AM-PAC PT "6 Clicks" Mobility  Outcome Measure Help needed turning from your back  to your side while in a flat bed without using bedrails?: A Lot Help needed moving from lying on your back to sitting on the side of a flat bed without using bedrails?: Total Help needed moving to and from a bed to a chair (including a wheelchair)?: Total Help needed standing up from a chair using your arms (e.g., wheelchair or bedside chair)?: Total Help needed to walk in hospital room?: Total Help needed climbing 3-5 steps with a railing? : Total 6 Click Score: 7    End of Session Equipment Utilized During Treatment: Gait belt Activity Tolerance: Patient tolerated treatment well Patient left: in bed;with call bell/phone within reach (on stretcher in ED) Nurse Communication: Mobility status PT Visit Diagnosis: Unsteadiness on feet (R26.81);Muscle weakness (generalized) (M62.81);Difficulty in walking, not elsewhere classified (R26.2);Ataxic gait (R26.0)    Time: 1115-5208 PT Time Calculation (min) (ACUTE ONLY): 20 min   Charges:   PT Evaluation $PT Eval Moderate Complexity: 1 Mod          Reuel Derby, PT, DPT  Acute Rehabilitation Services  Office: [phone]   Rudean Hitt 06/02/2022, 11:35 AM

## 2022-06-02 NOTE — Progress Notes (Signed)
Diagnostic Radiology asked to review patient for possible right hip aspiration. Imaging reviewed by Dr. Jeralyn Ruths - minimal right hip effusion. Joint aspiration attempt likely to be low yield. No procedure planned. Dr. Sloan Leiter aware.  [name], [place] [phone] 06/02/2022, 9:13 AM

## 2022-06-02 NOTE — Assessment & Plan Note (Signed)
Continue Keppra.

## 2022-06-02 NOTE — Progress Notes (Signed)
PROGRESS NOTE        PATIENT DETAILS Name: [name] [name] Age: 76 y.o. Sex: female Date of Birth: [birth year] Admit Date: 06/01/2022 Admitting Physician Athena Masse, MD AST:[id number], [name], MD  Brief Summary: Patient is a 76 y.o.  female with history of essential thrombocythemia (positive JAK2 mutation), CAD s/p PCI to LAD/LCx, DM-2, HTN, chronic cognitive dysfunction who presented with worsening intractable right hip pain.   Significant events: 7/30>> to ED with severe right hip pain-significantly confused after MRI.  Admit to [facility].  Unable to ambulate/bear weight due to pain.  Significant studies: 7/30>> x-ray right hip/pelvis: Severe degenerative changes of the right hip-progressed from 2015. 7/30>> MRI right hip: No avascular necrosis-mild right hip joint effusion.  Mild to moderate superior right femoral acetabular osteoarthritis, right subacromial.  Mild right subacromial/subdeltoid bursitis.  Significant microbiology data: 7/30>> blood culture: Pending 7/30>> urine culture: Pending  Procedures: None  Consults: Phone consult-orthopedics Interventional radiology  Subjective: Lying comfortably in bed-denies any chest pain or shortness of breath.  However continues to complain of pain in her right hip.  Per daughter-unable to ambulate due to pain.  Objective: Vitals: Blood pressure (!) 130/54, pulse 86, temperature 98.5 F (36.9 C), temperature source Oral, resp. rate (!) 23, height '5\' 2"'$  (1.575 m), weight 53.1 kg, SpO2 92 %.   Exam: Gen Exam:Alert awake-not in any distress HEENT:atraumatic, normocephalic Chest: B/L clear to auscultation anteriorly CVS:S1S2 regular-plus systolic murmur. Abdomen:soft non tender, non distended Extremities:no edema Neurology: Non focal Skin: no rash  Pertinent Labs/Radiology:    Latest Ref Rng & Units 06/01/2022    1:55 PM 05/20/2022    1:25 PM 04/16/2022    8:47 AM  CBC  WBC 4.0 - 10.5 K/uL 6.1  4.9  3.8    Hemoglobin 12.0 - 15.0 g/dL 7.9  7.9  8.6   Hematocrit 36.0 - 46.0 % 25.7  24.7  27.4   Platelets 150 - 400 K/uL 791  617  659     Lab Results  Component Value Date   NA 139 06/01/2022   K 3.9 06/01/2022   CL 108 06/01/2022   CO2 25 06/01/2022      Assessment/Plan: Severe right hip pain: discussed case with orthopedics/radiology-unfortunately not enough effusion present for arthrocentesis (therefore unlikely to have hemarthrosis/septic arthritis/acute gouty arthritis), given presence of advanced degenerative disease-likely pain due to severe OA.  Will try intra-articular steroid to see if this helps.  We will optimize pain control and try to ambulate with therapy.  Per daughter at bedside (RN here in the hospital)-unable to ambulate due to pain.  Acute metabolic/toxic encephalopathy: Unfortunately-became very confused last night post MRI.  Likely due to sedation required for MRI.  Currently she seems significantly better and at baseline.  Continue delirium precautions.  Given her underlying chronic cognitive dysfunction-at risk for sundowning in the hospital  Seizure disorder: Continue Keppra.  History of essential thrombocythemia: Continue anagrelide-follow CBC-resume outpatient follow-up with Dr. [name] postdischarge.  Macrocytic anemia/fluctuating leukopenia: Due to myeloproliferative disorder from essential thrombocytopenia.  Continue to follow.  History of VTE: Continue with Coumadin per pharmacy-INR therapeutic.  History of CAD: No anginal symptoms-follows with Dr. [name] statin/nitrates.  DM-2 (A1c 5.6 on 7/31): CBG stable with SSI.  Resume metformin on discharge.  Recent Labs    06/02/22 0246 06/02/22 0812  GLUCAP 95 109*  GERD: Continue PPI  BMI: Estimated body mass index is 21.4 kg/m as calculated from the following:   Height as of this encounter: '5\' 2"'$  (1.575 m).   Weight as of this encounter: 53.1 kg.   Code status:   Code Status: Partial Code    DVT Prophylaxis: Coumadin with therapeutic INR.  Family Communication:Daughter at bedside   Disposition Plan: Status is: Observation The patient will require care spanning > 2 midnights and should be moved to inpatient because: Ongoing hip pain-unable to ambulate-lives alone-unsafe disposition.  Needs optimization of pain control-intra-articular steroid injection-ambulation with PT to determine appropriate disposition.   Planned Discharge Destination:Home health   Diet: Diet Order             Diet heart healthy/carb modified Room service appropriate? Yes; Fluid consistency: Thin  Diet effective now                     Antimicrobial agents: Anti-infectives (From admission, onward)    None        MEDICATIONS: Scheduled Meds:  anagrelide  0.5 mg Oral Daily   insulin aspart  0-15 Units Subcutaneous TID WC   insulin aspart  0-5 Units Subcutaneous QHS   levETIRAcetam  250 mg Oral BID   pantoprazole  40 mg Oral Daily   rosuvastatin  10 mg Oral QPM   [START ON 06/03/2022] Warfarin - Pharmacist Dosing Inpatient   Does not apply q1600   Continuous Infusions:  lactated ringers 125 mL/hr at 06/02/22 0823   PRN Meds:.acetaminophen **OR** acetaminophen, HYDROmorphone (DILAUDID) injection, morphine injection, ondansetron **OR** ondansetron (ZOFRAN) IV   I have personally reviewed following labs and imaging studies  LABORATORY DATA: CBC: Recent Labs  Lab 06/01/22 1355  WBC 6.1  NEUTROABS 5.3  HGB 7.9*  HCT 25.7*  MCV 110.8*  PLT 791*    Basic Metabolic Panel: Recent Labs  Lab 06/01/22 1355  NA 139  K 3.9  CL 108  CO2 25  GLUCOSE 117*  BUN 16  CREATININE 0.89  CALCIUM 8.8*    GFR: Estimated Creatinine Clearance: 42.5 mL/min (by C-G formula based on SCr of 0.89 mg/dL).  Liver Function Tests: Recent Labs  Lab 06/01/22 1355  AST 20  ALT 11  ALKPHOS 34*  BILITOT 0.9  PROT 5.7*  ALBUMIN 3.6   No results for input(s): "LIPASE", "AMYLASE" in the  last 168 hours. No results for input(s): "AMMONIA" in the last 168 hours.  Coagulation Profile: Recent Labs  Lab 06/02/22 0256  INR 3.7*    Cardiac Enzymes: Recent Labs  Lab 06/01/22 1355  CKTOTAL 24*    BNP (last 3 results) No results for input(s): "PROBNP" in the last 8760 hours.  Lipid Profile: No results for input(s): "CHOL", "HDL", "LDLCALC", "TRIG", "CHOLHDL", "LDLDIRECT" in the last 72 hours.  Thyroid Function Tests: No results for input(s): "TSH", "T4TOTAL", "FREET4", "T3FREE", "THYROIDAB" in the last 72 hours.  Anemia Panel: No results for input(s): "VITAMINB12", "FOLATE", "FERRITIN", "TIBC", "IRON", "RETICCTPCT" in the last 72 hours.  Urine analysis:    Component Value Date/Time   COLORURINE YELLOW 06/01/2022 2346   APPEARANCEUR CLEAR 06/01/2022 2346   LABSPEC 1.016 06/01/2022 2346   PHURINE 6.0 06/01/2022 2346   GLUCOSEU NEGATIVE 06/01/2022 2346   HGBUR NEGATIVE 06/01/2022 2346   BILIRUBINUR NEGATIVE 06/01/2022 2346   KETONESUR 5 (A) 06/01/2022 2346   PROTEINUR NEGATIVE 06/01/2022 2346   UROBILINOGEN 1.0 06/28/2012 1310   NITRITE NEGATIVE 06/01/2022 2346   LEUKOCYTESUR SMALL (A)  06/01/2022 2346    Sepsis Labs: Lactic Acid, Venous    Component Value Date/Time   LATICACIDVEN 1.0 06/01/2022 1755    MICROBIOLOGY: No results found for this or any previous visit (from the past 240 hour(s)).  RADIOLOGY STUDIES/RESULTS: DG Chest Portable 1 View  Result Date: 06/02/2022 CLINICAL DATA:  Altered mental status. EXAM: PORTABLE CHEST 1 VIEW COMPARISON:  Chest radiograph dated 08/14/2021. FINDINGS: Background of emphysema and chronic interstitial coarsening. Diffuse hazy interstitial densities throughout the lungs, right greater left, new since the prior radiograph and may represent mild edema. Atypical pneumonia is not excluded clinical correlation is recommended. No consolidative changes. There is no pleural effusion pneumothorax. Stable cardiac silhouette.  Atherosclerotic calcification of the aorta. No acute osseous pathology. IMPRESSION: Diffuse hazy interstitial densities throughout the lungs, right greater left may represent mild edema. Atypical pneumonia is not excluded. Electronically Signed   By: Anner Crete M.D.   On: 06/02/2022 00:13   MR HIP RIGHT W WO CONTRAST  Result Date: 06/01/2022 CLINICAL DATA:  Hip pain.  Osteonecrosis suspected. EXAM: MRI OF THE RIGHT HIP WITHOUT AND WITH CONTRAST TECHNIQUE: Multiplanar, multisequence MR imaging was performed both before and after administration of intravenous contrast. CONTRAST:  5.76m GADAVIST GADOBUTROL 1 MMOL/ML IV SOLN COMPARISON:  Pelvis and right hip radiographs 06/01/2022. FINDINGS: Despite efforts by the technologist and patient, moderate to high-grade motion artifact is present on today's exam and could not be eliminated. This reduces exam sensitivity and specificity. Bones: No acute fracture or avascular necrosis is seen within limitations of the motion artifact. Mild pubic symphysis joint space narrowing and peripheral osteophytosis. Articular cartilage and labrum Articular cartilage: Within the limitations of patient motion artifact, there are mild subchondral cystic changes within the superolateral aspect of the humeral head. Likely mild superior femoral head and acetabular cartilage thinning. Labrum: Motion artifact limits evaluation. Suspected anterior superior acetabular labrum degenerative tears. Joint or bursal effusion Joint effusion: Mild right hip joint effusion. No left hip joint effusion. Bursae: Mild fluid within the right subacromial/subdeltoid bursa. Muscles and tendons Muscles and tendons: The origins of the bilateral common hamstring tendons and the rectus femoris tendons are intact. Within the limitations of motion artifact, no muscle tear is seen. Other findings Miscellaneous:   None. IMPRESSION: 1. Unfortunately, motion artifact mildly to markedly technically degrades this  examination. 2. No definite avascular necrosis is seen. 3. Mild right hip joint effusion. Superior right acetabular subchondral degenerative cystic change with mild-to-moderate superior right femoroacetabular osteoarthritis. 4. Mild right subacromial/subdeltoid bursitis. Electronically Signed   By: RYvonne KendallM.D.   On: 06/01/2022 22:06   DG Hip Unilat W or Wo Pelvis 2-3 Views Right  Result Date: 06/01/2022 CLINICAL DATA:  Right hip pain EXAM: DG HIP (WITH OR WITHOUT PELVIS) 2-3V RIGHT COMPARISON:  07/13/2014 FINDINGS: Severe degenerative changes of the right hip joint, progressed from 2015. No evidence of fracture or dislocation. No erosion or periosteal elevation is seen. No appreciable soft tissue abnormality. IMPRESSION: Severe degenerative changes of the right hip, progressed from 2015. Electronically Signed   By: NDavina PokeD.O.   On: 06/01/2022 14:49     LOS: 0 days   SOren Binet MD  [facility]    To contact the attending provider between 7A-7P or the covering provider during after hours 7P-7A, please log into the web site [email or web address] and access using universal [place] password for that web site. If you do not have the password, please call the hospital operator.  06/02/2022, 9:57  AM    

## 2022-06-02 NOTE — Assessment & Plan Note (Signed)
Anemia at baseline at 7.9

## 2022-06-02 NOTE — Assessment & Plan Note (Signed)
Patient became hypoxic after administration of hydrocodone down to 85% improving to the high 90s on 2 L We[place] O2 as tolerated

## 2022-06-02 NOTE — ED Notes (Signed)
ED TO INPATIENT HANDOFF REPORT  S Name/Age/Gender [name] [name] 76 y.o. female Room/Bed: 045C/045C  Code Status   Code Status: Partial Code  Home/SNF/Other Home Patient oriented to: self, place, time, and situation Is this baseline? Yes   Triage Complete: Triage complete  Chief Complaint Ambulatory dysfunction [R26.2]  Triage Note Pt coming from home via [facility] for increased pain in her leg that started 2 days ago and has progressively gotten worse. Today it was difficult to stand/walk. 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 Recently has had increased WBC. '6mg'$  Morphine and 1000 mg tylenol PTA  BP 128/80 CBG 124 Temp 100.1    Allergies Allergies  Allergen Reactions   Ampicillin Hives, Swelling and Rash   Avelox [Moxifloxacin Hcl In Nacl] Swelling   Bactrim [Sulfamethoxazole-Trimethoprim] Hives, Swelling and Rash   Ibuprofen Other (See Comments)    rash   Penicillins Other (See Comments)    rash   Diphenhydramine     Other reaction(s): rash   Sulfa Antibiotics     Other reaction(s): anaphylaxis   Codeine Rash    unknown   Latex Rash    unknown   Whey Rash    Level of Care/Admitting Diagnosis ED Disposition     ED Disposition  Admit   Condition  --   Comment  Hospital Area: [facility] [[id number]]  Level of Care: Med-Surg [16]  May place patient in observation at [facility] or [facility] if equivalent level of care is available:: Yes  Covid Evaluation: Asymptomatic - no recent exposure (last 10 days) testing not required  Diagnosis: Ambulatory dysfunction [1779390]  Admitting Physician: Athena Masse [[id number]]  Attending Physician: Athena Masse [[id number]]          B Medical/Surgery History Past Medical History:  Diagnosis Date   Anemia    Coronary artery disease    s/p stent to LAD and LCx   DM II (diabetes mellitus, type II), controlled ([place])    DVT (deep venous thrombosis) (H[facility])    Elevated LFTs     Hyperlipidemia    Hypertension    Memory loss    Myocardial infarction ([place])    7 yrs ago   Past Surgical History:  Procedure Laterality Date   CATARACT EXTRACTION     [place]  04/26/2003   NORMAL. EF 65%   I & D EXTREMITY  06/22/2012   Procedure: IRRIGATION AND DEBRIDEMENT EXTREMITY;  Surgeon: Tennis Must, MD;  Location: [place];  Service: Orthopedics;  Laterality: Left;   I & D EXTREMITY  06/29/2012   Procedure: IRRIGATION AND DEBRIDEMENT EXTREMITY;  Surgeon: Tennis Must, MD;  Location: [place];  Service: Orthopedics;  Laterality: Left;   OPEN REDUCTION INTERNAL FIXATION (ORIF) DISTAL RADIAL FRACTURE Right 02/04/2018   Procedure: OPEN REDUCTION INTERNAL FIXATION (ORIF) RIGHT DISTAL RADIAL FRACTURE;  Surgeon: Leanora Cover, MD;  Location: [place];  Service: Orthopedics;  Laterality: Right;     A IV Location/Drains/Wounds Patient Lines/Drains/Airways Status     Active Line/Drains/Airways     Name Placement date Placement time Site Days   Peripheral IV 06/02/22 20 G 1" Anterior;Left Forearm 06/02/22  0819  Forearm  less than 1   Incision 06/29/12 Hand Left 06/29/12  1119  -- 3625   Incision (Closed) 02/04/18 Wrist Right 02/04/18  1558  -- 1579   Wound 06/28/12 Other (Comment) Foot Left;Lateral red,  bruised 06/28/12  1600  Foot  3626   Wound 06/29/12 Other (Comment) Hand Left;Other (Comment) Dorsum of L hand open I+D 06/29/12  0900  Hand  3625   Wound 07/01/12 Other (Comment) Hand Left;Other (Comment) Dorsum L Ring Finger open I+D 07/01/12  1145  Hand  3623            Intake/Output Last 24 hours No intake or output data in the 24 hours ending 06/02/22 1129  Labs/Imaging Results for orders placed or performed during the hospital encounter of 06/01/22 (from the past 48 hour(s))  Comprehensive metabolic panel     Status: Abnormal   Collection Time: 06/01/22  1:55 PM  Result Value Ref Range   Sodium  139 135 - 145 mmol/L   Potassium 3.9 3.5 - 5.1 mmol/L   Chloride 108 98 - 111 mmol/L   CO2 25 22 - 32 mmol/L   Glucose, Bld 117 (H) 70 - 99 mg/dL    Comment: Glucose reference range applies only to samples taken after fasting for at least 8 hours.   BUN 16 8 - 23 mg/dL   Creatinine, Ser 0.89 0.44 - 1.00 mg/dL   Calcium 8.8 (L) 8.9 - 10.3 mg/dL   Total Protein 5.7 (L) 6.5 - 8.1 g/dL   Albumin 3.6 3.5 - 5.0 g/dL   AST 20 15 - 41 U/L   ALT 11 0 - 44 U/L   Alkaline Phosphatase 34 (L) 38 - 126 U/L   Total Bilirubin 0.9 0.3 - 1.2 mg/dL   GFR, Estimated >60 >60 mL/min    Comment: (NOTE) Calculated using the CKD-EPI Creatinine Equation (2021)    Anion gap 6 5 - 15    Comment: Performed at [facility], [employer] [employer] [address], [place], [place] [address]  Lactic acid, plasma     Status: None   Collection Time: 06/01/22  1:55 PM  Result Value Ref Range   Lactic Acid, Venous 1.0 0.5 - 1.9 mmol/L    Comment: Performed at [facility] [address], [place], [place] [address]  CBC with Differential     Status: Abnormal   Collection Time: 06/01/22  1:55 PM  Result Value Ref Range   WBC 6.1 4.0 - 10.5 K/uL   RBC 2.32 (L) 3.87 - 5.11 MIL/uL   Hemoglobin 7.9 (L) 12.0 - 15.0 g/dL   HCT 25.7 (L) 36.0 - 46.0 %   MCV 110.8 (H) 80.0 - 100.0 fL   MCH 34.1 (H) 26.0 - 34.0 pg   MCHC 30.7 30.0 - 36.0 g/dL   RDW 21.0 (H) 11.5 - 15.5 %   Platelets 791 (H) 150 - 400 K/uL   nRBC 0.3 (H) 0.0 - 0.2 %   Neutrophils Relative % 86 %   Neutro Abs 5.3 1.7 - 7.7 K/uL   Lymphocytes Relative 7 %   Lymphs Abs 0.4 (L) 0.7 - 4.0 K/uL   Monocytes Relative 5 %   Monocytes Absolute 0.3 0.1 - 1.0 K/uL   Eosinophils Relative 1 %   Eosinophils Absolute 0.0 0.0 - 0.5 K/uL   Basophils Relative 0 %   Basophils Absolute 0.0 0.0 - 0.1 K/uL   Immature Granulocytes 1 %   Abs Immature Granulocytes 0.05 0.00 - 0.07 K/uL    Comment: Performed at [facility], [place] [address] [place], [place] [address]  CK      Status: Abnormal   Collection Time: 06/01/22  1:55 PM  Result Value Ref Range  Total CK 24 (L) 38 - 234 U/L    Comment: Performed at [facility], [place] [address] [place], [place] [address]  Sedimentation rate     Status: None   Collection Time: 06/01/22  1:55 PM  Result Value Ref Range   Sed Rate 14 0 - 22 mm/hr    Comment: Performed at [facility] [address] [place], [place] [address]  C-reactive protein     Status: Abnormal   Collection Time: 06/01/22  1:55 PM  Result Value Ref Range   CRP 1.4 (H) <1.0 mg/dL    Comment: Performed at [facility], [place] [address], [place], [place] [address]  Blood culture (routine x 2)     Status: None (Preliminary result)   Collection Time: 06/01/22  3:40 PM   Specimen: BLOOD RIGHT FOREARM  Result Value Ref Range   Specimen Description BLOOD RIGHT FOREARM    Special Requests      BOTTLES DRAWN AEROBIC AND ANAEROBIC Blood Culture results may not be optimal due to an inadequate volume of blood received in culture bottles   Culture      NO GROWTH < 24 HOURS Performed at [facility], [place] [address], [place], [place] [address]    Report Status PENDING   Lactic acid, plasma     Status: None   Collection Time: 06/01/22  5:55 PM  Result Value Ref Range   Lactic Acid, Venous 1.0 0.5 - 1.9 mmol/L    Comment: Performed at [facility], [place] [address], [place], [place] [address]  Blood culture (routine x 2)     Status: None (Preliminary result)   Collection Time: 06/01/22  5:55 PM   Specimen: BLOOD  Result Value Ref Range   Specimen Description BLOOD LEFT ANTECUBITAL    Special Requests      BOTTLES DRAWN AEROBIC AND ANAEROBIC Blood Culture adequate volume   Culture      NO GROWTH < 24 HOURS Performed at [facility], [place] [address] [place], [place] [address]    Report Status PENDING   Urinalysis, Routine w reflex microscopic     Status: Abnormal   Collection Time: 06/01/22 11:46 PM  Result Value Ref  Range   Color, Urine YELLOW YELLOW   APPearance CLEAR CLEAR   Specific Gravity, Urine 1.016 1.005 - 1.030   pH 6.0 5.0 - 8.0   Glucose, UA NEGATIVE NEGATIVE mg/dL   Hgb urine dipstick NEGATIVE NEGATIVE   Bilirubin Urine NEGATIVE NEGATIVE   Ketones, ur 5 (A) NEGATIVE mg/dL   Protein, ur NEGATIVE NEGATIVE mg/dL   Nitrite NEGATIVE NEGATIVE   Leukocytes,Ua SMALL (A) NEGATIVE   RBC / HPF 0-5 0 - 5 RBC/hpf   WBC, UA 0-5 0 - 5 WBC/hpf   Bacteria, UA NONE SEEN NONE SEEN   Mucus PRESENT     Comment: Performed at [facility], [place] [address], [place], [place] [address]  CBG monitoring, ED     Status: None   Collection Time: 06/02/22  2:46 AM  Result Value Ref Range   Glucose-Capillary 95 70 - 99 mg/dL    Comment: Glucose reference range applies only to samples taken after fasting for at least 8 hours.  Hemoglobin A1c     Status: None   Collection Time: 06/02/22  2:56 AM  Result Value Ref Range   Hgb A1c MFr Bld 5.6 4.8 - 5.6 %    Comment: (NOTE)  Pre diabetes:          5.7%-6.4%  Diabetes:              >6.4%  Glycemic control for   <7.0% adults with diabetes    Mean Plasma Glucose 114.02 mg/dL    Comment: Performed at [facility] [address], [place], [place] [address]  Protime-INR     Status: Abnormal   Collection Time: 06/02/22  2:56 AM  Result Value Ref Range   Prothrombin Time 36.4 (H) 11.4 - 15.2 seconds   INR 3.7 (H) 0.8 - 1.2    Comment: (NOTE) INR goal varies based on device and disease states. Performed at [facility], [place] [address] [place], [place] [address]   CBG monitoring, ED     Status: Abnormal   Collection Time: 06/02/22  8:12 AM  Result Value Ref Range   Glucose-Capillary 109 (H) 70 - 99 mg/dL    Comment: Glucose reference range applies only to samples taken after fasting for at least 8 hours.   Comment 1 Notify RN    Comment 2 Document in Chart    VAS [place] LOWER EXTREMITY VENOUS (DVT) (ONLY [facility] & [facility])  Result Date: 06/02/2022  Lower  Venous DVT Study Patient Name:  BLANDINA RENALDO  Date of Exam:   06/02/2022 Medical Rec #: [id number]         Accession #:    [id number] Date of Birth: 1946-10-06         Patient Gender: F Patient Age:   66 years Exam Location:  [facility] Procedure:      VAS [place] LOWER EXTREMITY VENOUS (DVT) Referring Phys: Judd Gaudier --------------------------------------------------------------------------------  Indications: Pain (hip and groin). Other Indications: Severe degenerative changes of the right hip. Comparison Study: Prior bilateral LEV done 07/07/14 indicating DVT in the                   bilateral lower extremities. Performing Technologist: Sharion Dove RVS  Examination Guidelines: A complete evaluation includes B-mode imaging, spectral Doppler, color Doppler, and power Doppler as needed of all accessible portions of each vessel. Bilateral testing is considered an integral part of a complete examination. Limited examinations for reoccurring indications may be performed as noted. The reflux portion of the exam is performed with the patient in reverse Trendelenburg.  +---------+---------------+---------+-----------+----------+-------------------+ RIGHT    CompressibilityPhasicitySpontaneityPropertiesThrombus Aging      +---------+---------------+---------+-----------+----------+-------------------+ CFV      Full           Yes      Yes                                      +---------+---------------+---------+-----------+----------+-------------------+ SFJ      Full                                                             +---------+---------------+---------+-----------+----------+-------------------+ FV Prox  Full                                                             +---------+---------------+---------+-----------+----------+-------------------+  FV Mid   Full                                                              +---------+---------------+---------+-----------+----------+-------------------+ FV DistalFull                                                             +---------+---------------+---------+-----------+----------+-------------------+ PFV      Full                                                             +---------+---------------+---------+-----------+----------+-------------------+ POP                     Yes      Yes                  patent by color and                                                       Doppler             +---------+---------------+---------+-----------+----------+-------------------+ PTV      Full                                                             +---------+---------------+---------+-----------+----------+-------------------+ PERO     Full                                                             +---------+---------------+---------+-----------+----------+-------------------+   +----+---------------+---------+-----------+----------+--------------+ LEFTCompressibilityPhasicitySpontaneityPropertiesThrombus Aging +----+---------------+---------+-----------+----------+--------------+ CFV Full           Yes      Yes                                 +----+---------------+---------+-----------+----------+--------------+     Summary: RIGHT: - There is no evidence of deep vein thrombosis in the lower extremity.  - No cystic structure found in the popliteal fossa.  LEFT: - No evidence of common femoral vein obstruction.  *See table(s) above for measurements and observations.    Preliminary    DG Chest Portable 1 View  Result Date: 06/02/2022 CLINICAL DATA:  Altered mental status. EXAM: PORTABLE CHEST 1 VIEW COMPARISON:  Chest radiograph dated 08/14/2021. FINDINGS: Background of emphysema and chronic interstitial coarsening. Diffuse hazy interstitial densities  throughout the lungs, right greater left, new since the prior  radiograph and may represent mild edema. Atypical pneumonia is not excluded clinical correlation is recommended. No consolidative changes. There is no pleural effusion pneumothorax. Stable cardiac silhouette. Atherosclerotic calcification of the aorta. No acute osseous pathology. IMPRESSION: Diffuse hazy interstitial densities throughout the lungs, right greater left may represent mild edema. Atypical pneumonia is not excluded. Electronically Signed   By: Anner Crete M.D.   On: 06/02/2022 00:13   MR HIP RIGHT W WO CONTRAST  Result Date: 06/01/2022 CLINICAL DATA:  Hip pain.  Osteonecrosis suspected. EXAM: MRI OF THE RIGHT HIP WITHOUT AND WITH CONTRAST TECHNIQUE: Multiplanar, multisequence MR imaging was performed both before and after administration of intravenous contrast. CONTRAST:  5.28m GADAVIST GADOBUTROL 1 MMOL/ML IV SOLN COMPARISON:  Pelvis and right hip radiographs 06/01/2022. FINDINGS: Despite efforts by the technologist and patient, moderate to high-grade motion artifact is present on today's exam and could not be eliminated. This reduces exam sensitivity and specificity. Bones: No acute fracture or avascular necrosis is seen within limitations of the motion artifact. Mild pubic symphysis joint space narrowing and peripheral osteophytosis. Articular cartilage and labrum Articular cartilage: Within the limitations of patient motion artifact, there are mild subchondral cystic changes within the superolateral aspect of the humeral head. Likely mild superior femoral head and acetabular cartilage thinning. Labrum: Motion artifact limits evaluation. Suspected anterior superior acetabular labrum degenerative tears. Joint or bursal effusion Joint effusion: Mild right hip joint effusion. No left hip joint effusion. Bursae: Mild fluid within the right subacromial/subdeltoid bursa. Muscles and tendons Muscles and tendons: The origins of the bilateral common hamstring tendons and the rectus femoris tendons are  intact. Within the limitations of motion artifact, no muscle tear is seen. Other findings Miscellaneous:   None. IMPRESSION: 1. Unfortunately, motion artifact mildly to markedly technically degrades this examination. 2. No definite avascular necrosis is seen. 3. Mild right hip joint effusion. Superior right acetabular subchondral degenerative cystic change with mild-to-moderate superior right femoroacetabular osteoarthritis. 4. Mild right subacromial/subdeltoid bursitis. Electronically Signed   By: RYvonne KendallM.D.   On: 06/01/2022 22:06   DG Hip Unilat W or Wo Pelvis 2-3 Views Right  Result Date: 06/01/2022 CLINICAL DATA:  Right hip pain EXAM: DG HIP (WITH OR WITHOUT PELVIS) 2-3V RIGHT COMPARISON:  07/13/2014 FINDINGS: Severe degenerative changes of the right hip joint, progressed from 2015. No evidence of fracture or dislocation. No erosion or periosteal elevation is seen. No appreciable soft tissue abnormality. IMPRESSION: Severe degenerative changes of the right hip, progressed from 2015. Electronically Signed   By: NDavina PokeD.O.   On: 06/01/2022 14:49    Pending Labs Unresulted Labs (From admission, onward)     Start     Ordered   06/03/22 0500  CBC  Tomorrow morning,   R        06/02/22 1013   06/03/22 07654 Basic metabolic panel  Tomorrow morning,   R        06/02/22 1013   06/03/22 0500  Magnesium  Tomorrow morning,   R        06/02/22 1013   06/02/22 0830  Body fluid culture w Gram Stain  (Joint Fluid/Tissue Labs)  Once,   R       Question:  Are there also cytology or pathology orders on this specimen?  Answer:  No   06/02/22 0831   06/02/22 0830  Anaerobic culture w Gram Stain  (Joint Fluid/Tissue Labs)  Once,  R        06/02/22 0831   06/02/22 0830  Synovial cell count + diff, w/ crystals  (Joint Fluid/Tissue Labs)  ONCE - URGENT,   URGENT        06/02/22 0831   06/02/22 0500  Protime-INR  Daily at 5am,   STAT      06/02/22 0250   06/01/22 2303  Urine Culture  Once,    URGENT       Question:  Indication  Answer:  Altered mental status (if no other cause identified)   06/01/22 2303            Vitals/Pain Today's Vitals   06/02/22 1015 06/02/22 1030 06/02/22 1045 06/02/22 1100  BP: (!) 126/50 (!) 130/52 (!) 126/52 (!) 132/53  Pulse: 83 85 87 86  Resp: '20 20 20 16  '$ Temp:      TempSrc:      SpO2: 96% 99% 91% 93%  Weight:      Height:      PainSc:        Isolation Precautions No active isolations  Medications Medications  rosuvastatin (CRESTOR) tablet 10 mg (has no administration in time range)  pantoprazole (PROTONIX) EC tablet 40 mg (40 mg Oral Given 06/02/22 1007)  anagrelide (AGRYLIN) capsule 0.5 mg (0 mg Oral Hold 06/02/22 1106)  levETIRAcetam (KEPPRA) tablet 250 mg (0 mg Oral Hold 06/02/22 1106)  ondansetron (ZOFRAN) tablet 4 mg (has no administration in time range)    Or  ondansetron (ZOFRAN) injection 4 mg (has no administration in time range)  insulin aspart (novoLOG) injection 0-15 Units (0 Units Subcutaneous Hold 06/02/22 0818)  insulin aspart (novoLOG) injection 0-5 Units ( Subcutaneous Not Given 06/02/22 0250)  Warfarin - Pharmacist Dosing Inpatient (has no administration in time range)  acetaminophen (TYLENOL) tablet 1,000 mg (has no administration in time range)  morphine (PF) 2 MG/ML injection 1 mg (has no administration in time range)  oxyCODONE (Oxy IR/ROXICODONE) immediate release tablet 5 mg (has no administration in time range)  HYDROmorphone (DILAUDID) injection 1 mg (1 mg Intravenous Given 06/01/22 1350)  LORazepam (ATIVAN) injection 0.5 mg (0.5 mg Intravenous Given 06/01/22 1822)  gadobutrol (GADAVIST) 1 MMOL/ML injection 5.3 mL (5.3 mLs Intravenous Contrast Given 06/01/22 1905)    Mobility walks Low fall risk   Focused Assessments Cardiac Assessment Handoff:    Lab Results  Component Value Date   CKTOTAL 24 (L) 06/01/2022   No results found for: "DDIMER" Does the Patient currently have chest pain? No   ,  Neuro Assessment Handoff:  Swallow screen pass? Yes          Neuro Assessment:   Neuro Checks:      Last Documented NIHSS Modified Score:   Has TPA been given? No If patient is a Neuro Trauma and patient is going to OR before floor call report to [facility] nurse: [phone] or [phone]   R Recommendations: See Admitting Provider Note

## 2022-06-02 NOTE — Assessment & Plan Note (Signed)
No complaints of chest pain Continue nitroglycerin, rosuvastatin

## 2022-06-02 NOTE — H&P (Addendum)
History and Physical    Patient: [name] [name] WLN:[id number] DOB: 08/26/1946 DOA: 06/01/2022 DOS: the patient was seen and examined on 06/02/2022 PCP: [name], MD  Patient coming from: Home  Chief Complaint:  Chief Complaint  Patient presents with   Groin Pain   Leg Pain    HPI: [name] [name] is a 76 y.o. female with medical history significant for Essential thrombocythemia on hydroxyurea and anagrelide, last seen by oncology, Dr. [name] on 7/18, as well as history of DM 2, CAD s/p stent to LAD and LCx, HTN, memory loss but who lives alone and independently with her daughter nearby, who was brought to the ED with a 2-day history of right hip pain that became acutely worse on the day of arrival.  She has not had any falls or injury to the area but the pain was severe to the point that she was unable to ambulate or bear any weight on the leg.  She denied back pain but according to her her daughter [name] [name] at the bedside, she pulled her back decades ago.  She has had no fever or chills  ED course and data review: Temperature 99.4, BP 129/47 with otherwise normal vitals.  Following administration of pain meds O2 sat dropped to 85% improving up to 96% on O2 via nasal cannula at 2 L. Labs: CBC with WBC of 6.1, hemoglobin 7.9 which is baseline and platelets 791 slightly above last baseline in the 600s.  Lactic acid 1.  CMP mostly unremarkable.  Total CK24, sed rate 14 and CRP 1.4.  Urinalysis unremarkable. EKG, personally viewed and interpreted: Sinus at 73 with no acute ST-T wave changes MRI hip showed the following: IMPRESSION: 1. Unfortunately, motion artifact mildly to markedly technically degrades this examination. 2. No definite avascular necrosis is seen. 3. Mild right hip joint effusion. Superior right acetabular subchondral degenerative cystic change with mild-to-moderate superior right femoroacetabular osteoarthritis. 4. Mild right subacromial/subdeltoid bursitis.  Patient  was treated with hydromorphone x2 doses as well as Ativan After patient's return from MRI, she was very confused, picking at things.  Due to concern for safe discharge as patient lives alone, hospitalist consulted for admission.    Past Medical History:  Diagnosis Date   Anemia    Coronary artery disease    s/p stent to LAD and LCx   DM II (diabetes mellitus, type II), controlled ([name] [name])    DVT (deep venous thrombosis) (HCC)    Elevated LFTs    Hyperlipidemia    Hypertension    Memory loss    Myocardial infarction ([place])    7 yrs ago   Past Surgical History:  Procedure Laterality Date   CATARACT EXTRACTION     CESAREAN SECTION  1983   CORONARY ANGIOPLASTY WITH STENT PLACEMENT  04/26/2003   NORMAL. EF 65%   I & D EXTREMITY  06/22/2012   Procedure: IRRIGATION AND DEBRIDEMENT EXTREMITY;  Surgeon: Tennis Must, MD;  Location: [facility];  Service: Orthopedics;  Laterality: Left;   I & D EXTREMITY  06/29/2012   Procedure: IRRIGATION AND DEBRIDEMENT EXTREMITY;  Surgeon: Tennis Must, MD;  Location: [facility];  Service: Orthopedics;  Laterality: Left;   OPEN REDUCTION INTERNAL FIXATION (ORIF) DISTAL RADIAL FRACTURE Right 02/04/2018   Procedure: OPEN REDUCTION INTERNAL FIXATION (ORIF) RIGHT DISTAL RADIAL FRACTURE;  Surgeon: Leanora Cover, MD;  Location: [facility];  Service: Orthopedics;  Laterality: Right;   Social History:  reports that she has never smoked. She has  never used smokeless tobacco. She reports current alcohol use. She reports that she does not use drugs.  Allergies  Allergen Reactions   Ampicillin Hives, Swelling and Rash   Avelox [Moxifloxacin Hcl In Nacl] Swelling   Bactrim [Sulfamethoxazole-Trimethoprim] Hives, Swelling and Rash   Ibuprofen Other (See Comments)    rash   Penicillins Other (See Comments)    rash   Diphenhydramine     Other reaction(s): rash   Sulfa Antibiotics     Other reaction(s): anaphylaxis   Codeine Rash    unknown   Latex Rash     unknown   Whey Rash    Family History  Problem Relation Age of Onset   Heart attack Mother        X2   Aneurysm Mother        AAA   Stroke Mother    Heart disease Mother    Heart attack Father    Heart disease Father    Cirrhosis Father    Osteoarthritis Sister    Dementia Sister     Prior to Admission medications   Medication Sig Start Date End Date Taking? Authorizing Provider  anagrelide (AGRYLIN) 0.5 MG capsule Take 1 capsule (0.5 mg total) by mouth daily. 05/20/22  Yes Curt Bears, MD  fenofibrate micronized (LOFIBRA) 200 MG capsule TAKE ONE CAPSULE BY MOUTH ONCE DAILY BEFORE BREAKFAST 05/18/17  Yes Nahser, Wonda Cheng, MD  ferrous sulfate 325 (65 FE) MG EC tablet Take 325 mg by mouth daily as needed (low iron).   Yes [provider]  Fish Oil-Cholecalciferol (FISH OIL + D3) 1000-1000 MG-UNIT CAPS Take 1 tablet by mouth 3 (three) times daily.   Yes [provider]  hydroxyurea (HYDREA) 500 MG capsule TAKE 1 CAPSULE BY MOUTH ONCE DAILY. MAY TAKE WITH FOOD TO  MINIMIZE  GI  SIDE  EFFECTS. 03/11/22  Yes Curt Bears, MD  levETIRAcetam (KEPPRA) 250 MG tablet Take 1 tablet (250 mg total) by mouth 2 (two) times daily. 08/14/21  Yes Camara, Maryan Puls, MD  metFORMIN (GLUCOPHAGE) 500 MG tablet Take 1 tablet by mouth daily. 10/16/18  Yes [provider]  nitroGLYCERIN (NITROSTAT) 0.4 MG SL tablet Place 1 tablet (0.4 mg total) under the tongue every 5 (five) minutes as needed. For chest pain. 01/04/20  Yes Nahser, Wonda Cheng, MD  omeprazole (PRILOSEC) 40 MG capsule Take 40 mg by mouth every evening. 01/09/22  Yes [provider]  rosuvastatin (CRESTOR) 20 MG tablet Take 10 mg by mouth every evening.   Yes [provider]  warfarin (COUMADIN) 5 MG tablet Take 5 mg by mouth as directed. TAKE BY MOUTH AS DIRECTED. TAKE 1 TABLET (5 MG) DAILY ON WEDNESDAYS AND FRIDAYS. TAKE 1/2 TABLET (2.5 MG) DAILY ON OTHER DAYS.   Yes [provider]   prochlorperazine (COMPAZINE) 10 MG tablet Take 1 tablet (10 mg total) by mouth every 6 (six) hours as needed for nausea or vomiting. Patient not taking: Reported on 06/01/2022 10/05/19   Heilingoetter, Tobe Sos, PA-C    Physical Exam: Vitals:   06/01/22 2100 06/01/22 2115 06/01/22 2200 06/01/22 2345  BP: (!) 142/60  101/73 (!) 138/53  Pulse: 86 89 88 81  Resp: (!) 21 19 (!) 23 20  Temp:    98.5 F (36.9 C)  TempSrc:    Oral  SpO2: (S) (!) 85% 92% 93% 96%  Weight:      Height:       Physical Exam Vitals and nursing note reviewed.  Constitutional:      General: She is not in acute distress. HENT:     Head: Normocephalic and atraumatic.  Cardiovascular:     Rate and Rhythm: Normal rate and regular rhythm.     Pulses: Normal pulses.     Heart sounds: Normal heart sounds.  Pulmonary:     Effort: Pulmonary effort is normal.     Breath sounds: Normal breath sounds.  Abdominal:     Palpations: Abdomen is soft.     Tenderness: There is no abdominal tenderness.  Musculoskeletal:     Right lower leg: No edema.     Left lower leg: No edema.  Skin:    Coloration: Skin is not pale.     Findings: No bruising.  Neurological:     Mental Status: Mental status is at baseline.     Labs on Admission: I have personally reviewed following labs and imaging studies  CBC: Recent Labs  Lab 06/01/22 1355  WBC 6.1  NEUTROABS 5.3  HGB 7.9*  HCT 25.7*  MCV 110.8*  PLT 834*   Basic Metabolic Panel: Recent Labs  Lab 06/01/22 1355  NA 139  K 3.9  CL 108  CO2 25  GLUCOSE 117*  BUN 16  CREATININE 0.89  CALCIUM 8.8*   GFR: Estimated Creatinine Clearance: 42.5 mL/min (by C-G formula based on SCr of 0.89 mg/dL). Liver Function Tests: Recent Labs  Lab 06/01/22 1355  AST 20  ALT 11  ALKPHOS 34*  BILITOT 0.9  PROT 5.7*  ALBUMIN 3.6   No results for input(s): "LIPASE", "AMYLASE" in the last 168 hours. No results for input(s): "AMMONIA" in the last 168 hours. Coagulation  Profile: No results for input(s): "INR", "PROTIME" in the last 168 hours. Cardiac Enzymes: Recent Labs  Lab 06/01/22 1355  CKTOTAL 24*   BNP (last 3 results) No results for input(s): "PROBNP" in the last 8760 hours. HbA1C: No results for input(s): "HGBA1C" in the last 72 hours. CBG: No results for input(s): "GLUCAP" in the last 168 hours. Lipid Profile: No results for input(s): "CHOL", "HDL", "LDLCALC", "TRIG", "CHOLHDL", "LDLDIRECT" in the last 72 hours. Thyroid Function Tests: No results for input(s): "TSH", "T4TOTAL", "FREET4", "T3FREE", "THYROIDAB" in the last 72 hours. Anemia Panel: No results for input(s): "VITAMINB12", "FOLATE", "FERRITIN", "TIBC", "IRON", "RETICCTPCT" in the last 72 hours. Urine analysis:    Component Value Date/Time   COLORURINE YELLOW 06/01/2022 2346   APPEARANCEUR CLEAR 06/01/2022 2346   LABSPEC 1.016 06/01/2022 2346   PHURINE 6.0 06/01/2022 2346   GLUCOSEU NEGATIVE 06/01/2022 2346   HGBUR NEGATIVE 06/01/2022 2346   BILIRUBINUR NEGATIVE 06/01/2022 2346   KETONESUR 5 (A) 06/01/2022 2346   PROTEINUR NEGATIVE 06/01/2022 2346   UROBILINOGEN 1.0 06/28/2012 1310   NITRITE NEGATIVE 06/01/2022 2346   LEUKOCYTESUR SMALL (A) 06/01/2022 2346    Radiological Exams on Admission: DG Chest Portable 1 View  Result Date: 06/02/2022 CLINICAL DATA:  Altered mental status. EXAM: PORTABLE CHEST 1 VIEW COMPARISON:  Chest radiograph dated 08/14/2021. FINDINGS: Background of emphysema and chronic interstitial coarsening. Diffuse hazy interstitial densities throughout the lungs, right greater left, new since the prior radiograph and may represent mild edema. Atypical pneumonia is not excluded clinical correlation is recommended. No consolidative changes. There is no pleural effusion pneumothorax. Stable cardiac silhouette. Atherosclerotic calcification of the aorta. No acute osseous pathology. IMPRESSION: Diffuse hazy interstitial densities throughout the lungs, right  greater left may represent mild edema. Atypical pneumonia is not excluded. Electronically Signed   By: Milas Hock  Radparvar M.D.   On: 06/02/2022 00:13   MR HIP RIGHT W WO CONTRAST  Result Date: 06/01/2022 CLINICAL DATA:  Hip pain.  Osteonecrosis suspected. EXAM: MRI OF THE RIGHT HIP WITHOUT AND WITH CONTRAST TECHNIQUE: Multiplanar, multisequence MR imaging was performed both before and after administration of intravenous contrast. CONTRAST:  5.50m GADAVIST GADOBUTROL 1 MMOL/ML IV SOLN COMPARISON:  Pelvis and right hip radiographs 06/01/2022. FINDINGS: Despite efforts by the technologist and patient, moderate to high-grade motion artifact is present on today's exam and could not be eliminated. This reduces exam sensitivity and specificity. Bones: No acute fracture or avascular necrosis is seen within limitations of the motion artifact. Mild pubic symphysis joint space narrowing and peripheral osteophytosis. Articular cartilage and labrum Articular cartilage: Within the limitations of patient motion artifact, there are mild subchondral cystic changes within the superolateral aspect of the humeral head. Likely mild superior femoral head and acetabular cartilage thinning. Labrum: Motion artifact limits evaluation. Suspected anterior superior acetabular labrum degenerative tears. Joint or bursal effusion Joint effusion: Mild right hip joint effusion. No left hip joint effusion. Bursae: Mild fluid within the right subacromial/subdeltoid bursa. Muscles and tendons Muscles and tendons: The origins of the bilateral common hamstring tendons and the rectus femoris tendons are intact. Within the limitations of motion artifact, no muscle tear is seen. Other findings Miscellaneous:   None. IMPRESSION: 1. Unfortunately, motion artifact mildly to markedly technically degrades this examination. 2. No definite avascular necrosis is seen. 3. Mild right hip joint effusion. Superior right acetabular subchondral degenerative cystic  change with mild-to-moderate superior right femoroacetabular osteoarthritis. 4. Mild right subacromial/subdeltoid bursitis. Electronically Signed   By: RYvonne KendallM.D.   On: 06/01/2022 22:06   DG Hip Unilat W or Wo Pelvis 2-3 Views Right  Result Date: 06/01/2022 CLINICAL DATA:  Right hip pain EXAM: DG HIP (WITH OR WITHOUT PELVIS) 2-3V RIGHT COMPARISON:  07/13/2014 FINDINGS: Severe degenerative changes of the right hip joint, progressed from 2015. No evidence of fracture or dislocation. No erosion or periosteal elevation is seen. No appreciable soft tissue abnormality. IMPRESSION: Severe degenerative changes of the right hip, progressed from 2015. Electronically Signed   By: NDavina PokeD.O.   On: 06/01/2022 14:49     Data Reviewed: Relevant notes from primary care and specialist visits, past discharge summaries as available in EHR, including Care Everywhere. Prior diagnostic testing as pertinent to current admission diagnoses Updated medications and problem lists for reconciliation ED course, including vitals, labs, imaging, treatment and response to treatment Triage notes, nursing and pharmacy notes and ED provider's notes Notable results as noted in HPI   Assessment and Plan: Right hip pain Right hip joint effusion on MRI Ambulatory dysfunction Consider orthopedic consult Physical therapy evaluation Pain control Given history of DVT we will get right lower extremity venous duplex  Delirium due to multiple etiologies, acute, hyperactive History of memory loss Acute delirium likely related to acute pain, opiates administered to pain as well as Ativan administered for MRI Delirium precautions - Minimize/avoid deliriogenic meds including: anticholinergic, opiates, benzodiazepines           - Maintain hydration, oxygenation, nutrition           - Limit use of restraints and catheters           - Normalize sleep patterns by minimizing nighttime noise, light and interruptions by                 -Ensure sleep apnea treatment is provided overnight.  clustering care, opening blinds during the day           - Reorient the patient frequently, provide easily visible clock and calendar           - Provide sensory aids like glasses, hearing aids           - Encourage ambulation, regular activities and visitors to maintain cognitive stimulation              -Patient would benefit from having family members at bedside to reinforce his orientation. Haldol as needed Fall precautions   Hypoxia Patient became hypoxic after administration of hydrocodone down to 85% improving to the high 90s on 2 L Wean O2 as tolerated  History of seizure Continue Keppra  Chronic anticoagulation History of DVT Pharmacy consult for Coumadin We will get lower extremity venous duplex   Essential thrombocythemia ([place]) Continue hydroxyurea and anagrelide Was last seen by hematologist, Dr. [name] on 7/18 Can consider hematology consult.  Platelet counts in the 791, up from 617 on 7/18  Chronic anemia Anemia at baseline at 7.9  DM2 (diabetes mellitus, type 2) (HCC) Sliding scale insulin coverage  CAD (coronary artery disease) No complaints of chest pain Continue nitroglycerin, rosuvastatin        DVT prophylaxis: On Coumadin  Consults: none  Advance Care Planning:   Code Status: Prior DNI  Family Communication: Daughter [name] Fields at bedside  Disposition Plan: Back to previous home environment  Severity of Illness: The appropriate patient status for this patient is OBSERVATION. Observation status is judged to be reasonable and necessary in order to provide the required intensity of service to ensure the patient's safety. The patient's presenting symptoms, physical exam findings, and initial radiographic and laboratory data in the context of their medical condition is felt to place them at decreased risk for further clinical deterioration. Furthermore, it is  anticipated that the patient will be medically stable for discharge from the hospital within 2 midnights of admission.   Author: Athena Masse, MD 06/02/2022 1:32 AM  For on call review [email or web address].

## 2022-06-02 NOTE — Assessment & Plan Note (Addendum)
History of DVT Pharmacy consult for Coumadin We will get lower extremity venous duplex

## 2022-06-02 NOTE — Progress Notes (Signed)
ANTICOAGULATION CONSULT NOTE  Pharmacy Consult for Warfarin  Indication:  history of DVT  Patient Measurements: Height: '5\' 2"'$  (157.5 cm) Weight: 53.1 kg (117 lb) IBW/kg (Calculated) : 50.1  Vital Signs: Temp: 98.5 F (36.9 C) (07/30 2345) Temp Source: Oral (07/30 2345) BP: 119/51 (07/31 0800) Pulse Rate: 87 (07/31 0800)  Labs: Recent Labs    06/01/22 1355 06/02/22 0256  HGB 7.9*  --   HCT 25.7*  --   PLT 791*  --   LABPROT  --  36.4*  INR  --  3.7*  CREATININE 0.89  --   CKTOTAL 24*  --      Estimated Creatinine Clearance: 42.5 mL/min (by C-G formula based on SCr of 0.89 mg/dL).  Assessment: 76 yo female presented on 06/01/2022. Patient is on warfarin prior to admission for history of DVT and PE. Patient on warfarin '5mg'$  Monday and Friday and 2.'5mg'$  all other days per Care Everywhere note from 05/13/22. Patient's family reported last dose 7/29. INR is elevated at 3.7. No bleeding noted.   Goal of Therapy:  INR 2-3 Monitor platelets by anticoagulation protocol: Yes   Plan:  No warfarin today Daily INR  Salome Arnt, PharmD, BCPS, BCEMP Clinical Pharmacist Please see AMION for all pharmacy numbers 06/02/2022 8:50 AM

## 2022-06-02 NOTE — Assessment & Plan Note (Addendum)
Continue hydroxyurea and anagrelide Was last seen by hematologist, Dr. [name] on 7/18 Can consider hematology consult.  Platelet counts in the 791, up from 617 on 7/18

## 2022-06-02 NOTE — ED Notes (Signed)
Tele tracking is in

## 2022-06-02 NOTE — Assessment & Plan Note (Addendum)
History of memory loss Acute delirium likely related to acute pain, opiates administered to pain as well as Ativan administered for MRI Delirium precautions - Minimize/avoid deliriogenic meds including: anticholinergic, opiates, benzodiazepines           - Maintain hydration, oxygenation, nutrition           - Limit use of restraints and catheters           - Normalize sleep patterns by minimizing nighttime noise, light and interruptions by                -Ensure sleep apnea treatment is provided overnight.             clustering care, opening blinds during the day           - Reorient the patient frequently, provide easily visible clock and calendar           - Provide sensory aids like glasses, hearing aids           - Encourage ambulation, regular activities and visitors to maintain cognitive stimulation              -Patient would benefit from having family members at bedside to reinforce his orientation. Haldol as needed Fall precautions

## 2022-06-02 NOTE — Progress Notes (Signed)
VASCULAR LAB    Right lower extremity venous duplex has been performed.  See CV proc for preliminary results.   Thomas Rhude, RVT 06/02/2022, 10:12 AM

## 2022-06-02 NOTE — Assessment & Plan Note (Addendum)
Right hip joint effusion on MRI Ambulatory dysfunction Consider orthopedic consult Physical therapy evaluation Pain control Given history of DVT we will get right lower extremity venous duplex

## 2022-06-02 NOTE — Assessment & Plan Note (Signed)
Sliding scale insulin coverage 

## 2022-06-03 DIAGNOSIS — Z7901 Long term (current) use of anticoagulants: Secondary | ICD-10-CM | POA: Diagnosis not present

## 2022-06-03 DIAGNOSIS — M25551 Pain in right hip: Secondary | ICD-10-CM | POA: Diagnosis not present

## 2022-06-03 DIAGNOSIS — M00851 Arthritis due to other bacteria, right hip: Secondary | ICD-10-CM

## 2022-06-03 DIAGNOSIS — R262 Difficulty in walking, not elsewhere classified: Secondary | ICD-10-CM | POA: Diagnosis not present

## 2022-06-03 DIAGNOSIS — I2510 Atherosclerotic heart disease of native coronary artery without angina pectoris: Secondary | ICD-10-CM | POA: Diagnosis not present

## 2022-06-03 DIAGNOSIS — D75839 Thrombocytosis, unspecified: Secondary | ICD-10-CM

## 2022-06-03 DIAGNOSIS — L8990 Pressure ulcer of unspecified site, unspecified stage: Secondary | ICD-10-CM | POA: Insufficient documentation

## 2022-06-03 LAB — CBC
HCT: 24 % — ABNORMAL LOW (ref 36.0–46.0)
Hemoglobin: 7.5 g/dL — ABNORMAL LOW (ref 12.0–15.0)
MCH: 34.6 pg — ABNORMAL HIGH (ref 26.0–34.0)
MCHC: 31.3 g/dL (ref 30.0–36.0)
MCV: 110.6 fL — ABNORMAL HIGH (ref 80.0–100.0)
Platelets: 624 10*3/uL — ABNORMAL HIGH (ref 150–400)
RBC: 2.17 MIL/uL — ABNORMAL LOW (ref 3.87–5.11)
RDW: 20.3 % — ABNORMAL HIGH (ref 11.5–15.5)
WBC: 6 10*3/uL (ref 4.0–10.5)
nRBC: 0.3 % — ABNORMAL HIGH (ref 0.0–0.2)

## 2022-06-03 LAB — GLUCOSE, CAPILLARY
Glucose-Capillary: 146 mg/dL — ABNORMAL HIGH (ref 70–99)
Glucose-Capillary: 137 mg/dL — ABNORMAL HIGH (ref 70–99)
Glucose-Capillary: 89 mg/dL (ref 70–99)
Glucose-Capillary: 188 mg/dL — ABNORMAL HIGH (ref 70–99)

## 2022-06-03 LAB — URINE CULTURE: Culture: 40000 — AB

## 2022-06-03 LAB — C-REACTIVE PROTEIN: CRP: 14.9 mg/dL — ABNORMAL HIGH (ref <1.0)

## 2022-06-03 LAB — PROTIME-INR
INR: 2.6 — ABNORMAL HIGH (ref 0.8–1.2)
Prothrombin Time: 27.2 seconds — ABNORMAL HIGH (ref 11.4–15.2)

## 2022-06-03 LAB — BASIC METABOLIC PANEL
Anion gap: 7 (ref 5–15)
BUN: 13 mg/dL (ref 8–23)
CO2: 26 mmol/L (ref 22–32)
Calcium: 8.6 mg/dL — ABNORMAL LOW (ref 8.9–10.3)
Chloride: 108 mmol/L (ref 98–111)
Creatinine, Ser: 0.8 mg/dL (ref 0.44–1.00)
GFR, Estimated: >60 mL/min (ref >60)
Glucose, Bld: 158 mg/dL — ABNORMAL HIGH (ref 70–99)
Potassium: 4 mmol/L (ref 3.5–5.1)
Sodium: 141 mmol/L (ref 135–145)

## 2022-06-03 LAB — MAGNESIUM: Magnesium: 1.9 mg/dL (ref 1.7–2.4)

## 2022-06-03 MED ORDER — CEFTRIAXONE SODIUM 2 G IJ SOLR
2.0000 g | INTRAMUSCULAR | Status: DC
  Administered 2022-06-03 – 2022-06-06 (×4): 2 g via INTRAVENOUS
  Filled 2022-06-03 (×4): qty 20

## 2022-06-03 MED ORDER — WARFARIN SODIUM 2.5 MG PO TABS
2.5000 mg | ORAL_TABLET | Freq: Once | ORAL | Status: AC
  Administered 2022-06-03: 2.5 mg via ORAL
  Filled 2022-06-03: qty 1

## 2022-06-03 MED ORDER — HYDROXYUREA 500 MG PO CAPS
500.0000 mg | ORAL_CAPSULE | Freq: Every day | ORAL | Status: DC
  Administered 2022-06-03 – 2022-06-06 (×3): 500 mg via ORAL
  Filled 2022-06-03 (×3): qty 1

## 2022-06-03 MED ORDER — VANCOMYCIN HCL IN DEXTROSE 1-5 GM/200ML-% IV SOLN
1000.0000 mg | Freq: Once | INTRAVENOUS | Status: AC
  Administered 2022-06-03: 1000 mg via INTRAVENOUS
  Filled 2022-06-03: qty 200

## 2022-06-03 MED ORDER — VANCOMYCIN HCL 750 MG/150ML IV SOLN
750.0000 mg | INTRAVENOUS | Status: DC
Start: 2022-06-04 — End: 2022-06-06
  Administered 2022-06-04 – 2022-06-05 (×2): 750 mg via INTRAVENOUS
  Filled 2022-06-03 (×2): qty 150

## 2022-06-03 NOTE — Progress Notes (Signed)
  Transition of Care [facility]) Screening Note   Patient Details  Name: [name] [name] Date of Birth: Sep 07, 1946   Transition [facility]) CM/SW Contact:    Cyndi Bender, RN Phone Number: 06/03/2022, 8:29 AM    Transition of Care [facility]) has reviewed patient and no TOC needs have been identified at this time. We will continue to monitor patient advancement through interdisciplinary progression rounds. If new patient transition needs arise, please place a TOC consult.

## 2022-06-03 NOTE — Progress Notes (Signed)
Pharmacy Antibiotic Note  [name] [name] is a 76 y.o. female admitted on 06/01/2022 with concern for native R-hip septic arthritis.  Pharmacy has been consulted for Vancomycin + Rocephin dosing.  Plan: - Vancomycin 1g IV x 1 followed by 750 mg IV every 24 hours (eAUC 450, Vd 0.72, SCr 0.8) - Rocephin 2g IV every 24 hours - Will continue to follow renal function, culture results, LOT, and antibiotic de-escalation plans   Height: '5\' 2"'$  (157.5 cm) Weight: 53.1 kg (117 lb) IBW/kg (Calculated) : 50.1  Temp (24hrs), Avg:98.9 F (37.2 C), Min:97.8 F (36.6 C), Max:101.3 F (38.5 C)  Recent Labs  Lab 06/01/22 1355 06/01/22 1755 06/03/22 0209  WBC 6.1  --  6.0  CREATININE 0.89  --  0.80  LATICACIDVEN 1.0 1.0  --     Estimated Creatinine Clearance: 47.3 mL/min (by C-G formula based on SCr of 0.8 mg/dL).    Allergies  Allergen Reactions   Ampicillin Hives, Swelling and Rash   Avelox [Moxifloxacin Hcl In Nacl] Swelling   Bactrim [Sulfamethoxazole-Trimethoprim] Hives, Swelling and Rash   Ibuprofen Other (See Comments)    rash   Penicillins Other (See Comments)    rash   Diphenhydramine     Other reaction(s): rash   Sulfa Antibiotics     Other reaction(s): anaphylaxis   Codeine Rash    unknown   Latex Rash    unknown   Whey Rash    Regular dairy ok    Thank you for allowing pharmacy to be a part of this patient's care.  Alycia Rossetti, PharmD, BCPS Infectious Diseases Clinical Pharmacist 06/03/2022 1:34 PM   **Pharmacist phone directory can now be found on [email or web address] ([email or web address]).  Listed under [place].

## 2022-06-03 NOTE — TOC CM/SW Note (Signed)
HF TOC CM spoke to dtr, [facility] and she wants a SNF rehab in [place]. Message sent to Unit CSW/CM to follow up. States she will be providing transportation to facility. [place], Heart Failure TOC CM [phone]

## 2022-06-03 NOTE — TOC Progression Note (Signed)
Transition of Care [facility]) - Progression Note    Patient Details  Name: [name] [name] MRN: [id number] Date of Birth: 12-13-1945  [facility] CM/SW [place], LCSW Phone Number: 06/03/2022, 3:52 PM  Clinical Narrative:    CSW received call from [facility] [phone]) requesting CSW fax referral to [phone]. Referral faxed.    Expected Discharge Plan: [place] Barriers to Discharge: Continued Medical Work up, SNF Pending bed offer  Expected Discharge Plan and Services Expected Discharge Plan: [place] In-house Referral: Clinical Social Work   Post Acute Care Choice: [place] Living arrangements for the past 2 months: Single Family Home                                       Social Determinants of Health (SDOH) Interventions    Readmission Risk Interventions     No data to display

## 2022-06-03 NOTE — Consult Note (Signed)
[facility]  Total days of antibiotics 2         Reason for Consult:septic arthritis   Referring Physician: ghimire  Principal Problem:   Ambulatory dysfunction Active Problems:   CAD (coronary artery disease)   DM2 (diabetes mellitus, type 2) (HCC)   Chronic anemia   Essential thrombocythemia ([place])   Right hip pain   Delirium due to multiple etiologies, acute, hyperactive   Right hip joint effusion   Memory loss   Chronic anticoagulation   History of seizure   Hypoxia   Pressure injury of skin    HPI: [name] [name] is a 76 y.o. female with hx of essential thrombocythemia, who has new onset right hip pain, so severe that she was unable to stand and brought in by EMS in the setting of fevers. She states that she first start to notice swelling to inguinal canal and some throbbing a few prior to admission. On admit, her WBC is 6.0 with 81%N (up from baseline of 3.5K). sed rate of 14. Her imaging showed mild right hip joint effusion with right sbub-acromial and subdeltoid bursitis and mild to moderate superior right femoraacetabular OA. She underwent aspiration and synovial fluid count is bloody, no crystals, 23,6000 cells with 99%N. Cultures pending, she is having intermittent fevers up to 101F last night.   Roughly a 2-3 wk had pruritic rash in patches on abdomen and right flank per report. Not present. No recent tick bites. Reviewed abtx allergies  Past Medical History:  Diagnosis Date   Anemia    Coronary artery disease    s/p stent to LAD and LCx   DM II (diabetes mellitus, type II), controlled ([place])    DVT (deep venous thrombosis) (HCC)    Elevated LFTs    Hyperlipidemia    Hypertension    Memory loss    Myocardial infarction ([place])    7 yrs ago    Allergies:  Allergies  Allergen Reactions   Ampicillin Hives, Swelling and Rash   Avelox [Moxifloxacin Hcl In Nacl] Swelling   Bactrim [Sulfamethoxazole-Trimethoprim] Hives, Swelling and  Rash   Ibuprofen Other (See Comments)    rash   Penicillins Other (See Comments)    rash   Diphenhydramine     Other reaction(s): rash   Sulfa Antibiotics     Other reaction(s): anaphylaxis   Codeine Rash    unknown   Latex Rash    unknown   Whey Rash    MEDICATIONS:  acetaminophen  1,000 mg Oral Q8H   anagrelide  0.5 mg Oral Daily   hydroxyurea  500 mg Oral Daily   insulin aspart  0-15 Units Subcutaneous TID WC   insulin aspart  0-5 Units Subcutaneous QHS   levETIRAcetam  250 mg Oral BID   pantoprazole  40 mg Oral Daily   rosuvastatin  10 mg Oral QPM   warfarin  2.5 mg Oral ONCE-1600   Warfarin - Pharmacist Dosing Inpatient   Does not apply q1600    Social History   Tobacco Use   Smoking status: Never   Smokeless tobacco: Never  Vaping Use   Vaping Use: Never used  Substance Use Topics   Alcohol use: Yes    Comment: social    Drug use: No    Family History  Problem Relation Age of Onset   Heart attack Mother        X2   Aneurysm Mother  AAA   Stroke Mother    Heart disease Mother    Heart attack Father    Heart disease Father    Cirrhosis Father    Osteoarthritis Sister    Dementia Sister     Review of Syst+ for fever, chills, diaphoresis, activity change, appetite change, fatigue and unexpected weight loss in the last 3 years still below her baseline weight. HENT: Negative for congestion, sore throat, rhinorrhea, sneezing, trouble swallowing and sinus pressure.  Eyes: Negative for photophobia and visual disturbance.  Respiratory: Negative for cough, chest tightness, shortness of breath, wheezing and stridor.  Cardiovascular: Negative for chest pain, palpitations and leg swelling.  Gastrointestinal: Negative for nausea, vomiting, abdominal pain, diarrhea, constipation, blood in stool, abdominal distention and anal bleeding.  Genitourinary: Negative for dysuria, hematuria, flank pain and difficulty urinating.  Musculoskeletal: +right leg/hip pain.  Negative for myalgias, back pain, joint swelling, arthralgias and gait problem.  Skin: Negative for color change, pallor, rash and wound.  Neurological: Negative for dizziness, tremors, weakness and light-headedness.  Hematological: Negative for adenopathy. Does not bruise/bleed easily.  Psychiatric/Behavioral: Negative for behavioral problems, confusion, sleep disturbance, dysphoric mood, decreased concentration and agitation.    OBJECTIVE: Temp:  [97.8 F (36.6 C)-101.3 F (38.5 C)] 97.8 F (36.6 C) (08/01 0803) Pulse Rate:  [60-86] 65 (08/01 0803) Resp:  [14-24] 15 (08/01 0803) BP: (98-123)/(41-73) 98/47 (08/01 0803) SpO2:  [88 %-100 %] 95 % (08/01 0803) Physical Exam  Constitutional:  oriented to person, place, and time. appears well-developed and well-nourished. No distress.  HENT: [place]/AT, PERRLA, no scleral icterus Mouth/Throat: Oropharynx is clear and moist. No oropharyngeal exudate.  Cardiovascular: Normal rate, regular rhythm and normal heart sounds. Exam reveals no gallop and no friction rub.  No murmur heard.  Pulmonary/Chest: Effort normal and breath sounds normal. No respiratory distress.  has no wheezes.  Neck = supple, no nuchal rigidity Abdominal: Soft. Bowel sounds are normal.  exhibits no distension. There is no tenderness.  Lymphadenopathy: no cervical adenopathy. +inguinal node on the right Neurological: alert and oriented to person, place, and time. Muscle wasting Skin: Skin is warm and dry. No rash noted. No erythema.  Psychiatric: a normal mood and affect.  behavior is normal.   LABS: Results for orders placed or performed during the hospital encounter of 06/01/22 (from the past 48 hour(s))  Comprehensive metabolic panel     Status: Abnormal   Collection Time: 06/01/22  1:55 PM  Result Value Ref Range   Sodium 139 135 - 145 mmol/L   Potassium 3.9 3.5 - 5.1 mmol/L   Chloride 108 98 - 111 mmol/L   CO2 25 22 - 32 mmol/L   Glucose, Bld 117 (H) 70 - 99 mg/dL     Comment: Glucose reference range applies only to samples taken after fasting for at least 8 hours.   BUN 16 8 - 23 mg/dL   Creatinine, Ser 0.89 0.44 - 1.00 mg/dL   Calcium 8.8 (L) 8.9 - 10.3 mg/dL   Total Protein 5.7 (L) 6.5 - 8.1 g/dL   Albumin 3.6 3.5 - 5.0 g/dL   AST 20 15 - 41 U/L   ALT 11 0 - 44 U/L   Alkaline Phosphatase 34 (L) 38 - 126 U/L   Total Bilirubin 0.9 0.3 - 1.2 mg/dL   GFR, Estimated >60 >60 mL/min    Comment: (NOTE) Calculated using the CKD-EPI Creatinine Equation (2021)    Anion gap 6 5 - 15    Comment: Performed at [facility], [place] [address] [place], [place] [address]  Lactic acid, plasma     Status: None   Collection Time: 06/01/22  1:55 PM  Result Value Ref Range   Lactic Acid, Venous 1.0 0.5 - 1.9 mmol/L    Comment: Performed at [facility] [address], [place], [place] [address]  CBC with Differential     Status: Abnormal   Collection Time: 06/01/22  1:55 PM  Result Value Ref Range   WBC 6.1 4.0 - 10.5 K/uL   RBC 2.32 (L) 3.87 - 5.11 MIL/uL   Hemoglobin 7.9 (L) 12.0 - 15.0 g/dL   HCT 25.7 (L) 36.0 - 46.0 %   MCV 110.8 (H) 80.0 - 100.0 fL   MCH 34.1 (H) 26.0 - 34.0 pg   MCHC 30.7 30.0 - 36.0 g/dL   RDW 21.0 (H) 11.5 - 15.5 %   Platelets 791 (H) 150 - 400 K/uL   nRBC 0.3 (H) 0.0 - 0.2 %   Neutrophils Relative % 86 %   Neutro Abs 5.3 1.7 - 7.7 K/uL   Lymphocytes Relative 7 %   Lymphs Abs 0.4 (L) 0.7 - 4.0 K/uL   Monocytes Relative 5 %   Monocytes Absolute 0.3 0.1 - 1.0 K/uL   Eosinophils Relative 1 %   Eosinophils Absolute 0.0 0.0 - 0.5 K/uL   Basophils Relative 0 %   Basophils Absolute 0.0 0.0 - 0.1 K/uL   Immature Granulocytes 1 %   Abs Immature Granulocytes 0.05 0.00 - 0.07 K/uL    Comment: Performed at [facility], [place] [address], [place], [place] [address]  CK     Status: Abnormal   Collection Time: 06/01/22  1:55 PM  Result Value Ref Range   Total CK 24 (L) 38 - 234 U/L    Comment: Performed at [facility], [place] [address] [place], [place] [address]  Sedimentation rate     Status: None   Collection Time: 06/01/22  1:55 PM  Result Value Ref Range   Sed Rate 14 0 - 22 mm/hr    Comment: Performed at [facility] [address] [place], [place] [address]  C-reactive protein     Status: Abnormal   Collection Time: 06/01/22  1:55 PM  Result Value Ref Range   CRP 1.4 (H) <1.0 mg/dL    Comment: Performed at [facility], [place] [address], [place], [place] [address]  Blood culture (routine x 2)     Status: None (Preliminary result)   Collection Time: 06/01/22  3:40 PM   Specimen: BLOOD RIGHT FOREARM  Result Value Ref Range   Specimen Description BLOOD RIGHT FOREARM    Special Requests      BOTTLES DRAWN AEROBIC AND ANAEROBIC Blood Culture results may not be optimal due to an inadequate volume of blood received in culture bottles   Culture      NO GROWTH 2 DAYS Performed at [facility], [place] [address], [place], [place] [address]    Report Status PENDING   Lactic acid, plasma     Status: None   Collection Time: 06/01/22  5:55 PM  Result Value Ref Range   Lactic Acid, Venous 1.0 0.5 - 1.9 mmol/L    Comment: Performed at [facility], [place] [address] [place], [place] [address]  Blood culture (routine x 2)     Status: None (Preliminary result)   Collection Time: 06/01/22  5:55  PM   Specimen: BLOOD  Result Value Ref Range   Specimen Description BLOOD LEFT ANTECUBITAL    Special Requests      BOTTLES DRAWN AEROBIC AND ANAEROBIC Blood Culture adequate volume   Culture      NO GROWTH 2 DAYS Performed at [facility], [place] [address], [place], [place] [address]    Report Status PENDING   Urine Culture     Status: Abnormal   Collection Time: 06/01/22 11:03 PM   Specimen: Urine, Clean Catch  Result Value Ref Range   Specimen Description URINE, CLEAN CATCH    Special Requests      NONE Performed at [facility], [place] [address], [place], [place]  [address]    Culture (A)     40,000 COLONIES/mL STAPHYLOCOCCUS EPIDERMIDIS 40,000 COLONIES/mL ESCHERICHIA COLI    Report Status 06/03/2022 FINAL    Organism ID, Bacteria STAPHYLOCOCCUS EPIDERMIDIS (A)    Organism ID, Bacteria ESCHERICHIA COLI (A)       Susceptibility   Escherichia coli - MIC*    AMPICILLIN 8 SENSITIVE Sensitive     CEFAZOLIN <=4 SENSITIVE Sensitive     CEFEPIME <=0.12 SENSITIVE Sensitive     CEFTRIAXONE <=0.25 SENSITIVE Sensitive     CIPROFLOXACIN <=0.25 SENSITIVE Sensitive     GENTAMICIN <=1 SENSITIVE Sensitive     IMIPENEM <=0.25 SENSITIVE Sensitive     NITROFURANTOIN <=16 SENSITIVE Sensitive     TRIMETH/SULFA <=20 SENSITIVE Sensitive     AMPICILLIN/SULBACTAM 4 SENSITIVE Sensitive     PIP/TAZO <=4 SENSITIVE Sensitive     * 40,000 COLONIES/mL ESCHERICHIA COLI   Staphylococcus epidermidis - MIC*    CIPROFLOXACIN <=0.5 SENSITIVE Sensitive     GENTAMICIN <=0.5 SENSITIVE Sensitive     NITROFURANTOIN <=16 SENSITIVE Sensitive     OXACILLIN >=4 RESISTANT Resistant     TETRACYCLINE <=1 SENSITIVE Sensitive     VANCOMYCIN 1 SENSITIVE Sensitive     TRIMETH/SULFA <=10 SENSITIVE Sensitive     CLINDAMYCIN <=0.25 SENSITIVE Sensitive     RIFAMPIN <=0.5 SENSITIVE Sensitive     Inducible Clindamycin NEGATIVE Sensitive     * 40,000 COLONIES/mL STAPHYLOCOCCUS EPIDERMIDIS  Urinalysis, Routine w reflex microscopic     Status: Abnormal   Collection Time: 06/01/22 11:46 PM  Result Value Ref Range   Color, Urine YELLOW YELLOW   APPearance CLEAR CLEAR   Specific Gravity, Urine 1.016 1.005 - 1.030   pH 6.0 5.0 - 8.0   Glucose, UA NEGATIVE NEGATIVE mg/dL   Hgb urine dipstick NEGATIVE NEGATIVE   Bilirubin Urine NEGATIVE NEGATIVE   Ketones, ur 5 (A) NEGATIVE mg/dL   Protein, ur NEGATIVE NEGATIVE mg/dL   Nitrite NEGATIVE NEGATIVE   Leukocytes,Ua SMALL (A) NEGATIVE   RBC / HPF 0-5 0 - 5 RBC/hpf   WBC, UA 0-5 0 - 5 WBC/hpf   Bacteria, UA NONE SEEN NONE SEEN   Mucus PRESENT      Comment: Performed at [facility], [place] [address] [place], [place] [address]  CBG monitoring, ED     Status: None   Collection Time: 06/02/22  2:46 AM  Result Value Ref Range   Glucose-Capillary 95 70 - 99 mg/dL    Comment: Glucose reference range applies only to samples taken after fasting for at least 8 hours.  Hemoglobin A1c     Status: None   Collection Time: 06/02/22  2:56 AM  Result Value Ref Range   Hgb A1c MFr Bld 5.6 4.8 -  5.6 %    Comment: (NOTE) Pre diabetes:          5.7%-6.4%  Diabetes:              >6.4%  Glycemic control for   <7.0% adults with diabetes    Mean Plasma Glucose 114.02 mg/dL    Comment: Performed at [facility] [address] [place], [place] [address]  Protime-INR     Status: Abnormal   Collection Time: 06/02/22  2:56 AM  Result Value Ref Range   Prothrombin Time 36.4 (H) 11.4 - 15.2 seconds   INR 3.7 (H) 0.8 - 1.2    Comment: (NOTE) INR goal varies based on device and disease states. Performed at [facility], [place] [address] [place], [place] [address]   CBG monitoring, ED     Status: Abnormal   Collection Time: 06/02/22  8:12 AM  Result Value Ref Range   Glucose-Capillary 109 (H) 70 - 99 mg/dL    Comment: Glucose reference range applies only to samples taken after fasting for at least 8 hours.   Comment 1 Notify RN    Comment 2 Document in Chart   CBG monitoring, ED     Status: Abnormal   Collection Time: 06/02/22 12:19 PM  Result Value Ref Range   Glucose-Capillary 106 (H) 70 - 99 mg/dL    Comment: Glucose reference range applies only to samples taken after fasting for at least 8 hours.  Synovial cell count + diff, w/ crystals     Status: Abnormal   Collection Time: 06/02/22  2:37 PM  Result Value Ref Range   Color, Synovial RED (A) YELLOW   Appearance-Synovial BLOODY (A) CLEAR   Crystals, Fluid NO CRYSTALS SEEN    WBC, Synovial 23,600 (H) 0 - 200 /cu mm   Neutrophil, Synovial 99 (H) 0 - 25 %    Lymphocytes-Synovial Fld 0 0 - 20 %   Monocyte-Macrophage-Synovial Fluid 1 (L) 50 - 90 %   Eosinophils-Synovial 0 0 - 1 %    Comment: Performed at [facility] [address], [place], [place] [address]  Anaerobic culture w Gram Stain     Status: None (Preliminary result)   Collection Time: 06/02/22  2:37 PM   Specimen: PATH Cytology Misc. fluid; Synovial Fluid  Result Value Ref Range   Specimen Description SYNOVIAL    Special Requests RIGHT HIP    Gram Stain      ABUNDANT WBC PRESENT,BOTH PMN AND MONONUCLEAR NO ORGANISMS SEEN Performed at [facility], [address], [place], [place] [address]    Culture PENDING    Report Status PENDING   Body fluid culture w Gram Stain     Status: None (Preliminary result)   Collection Time: 06/02/22  2:37 PM   Specimen: PATH Cytology Misc. fluid; Synovial Fluid  Result Value Ref Range   Specimen Description SYNOVIAL    Special Requests RIGHT HIP    Gram Stain      FEW WBC PRESENT, PREDOMINANTLY MONONUCLEAR NO ORGANISMS SEEN    Culture      NO GROWTH < 24 HOURS Performed at [facility], [address], [place], [place] [address]    Report Status PENDING   Glucose, capillary     Status: Abnormal   Collection Time: 06/02/22  5:55 PM  Result Value Ref Range   Glucose-Capillary 126 (H) 70 - 99 mg/dL    Comment: Glucose reference range applies only  to samples taken after fasting for at least 8 hours.  Glucose, capillary     Status: Abnormal   Collection Time: 06/02/22  9:17 PM  Result Value Ref Range   Glucose-Capillary 167 (H) 70 - 99 mg/dL    Comment: Glucose reference range applies only to samples taken after fasting for at least 8 hours.  Protime-INR     Status: Abnormal   Collection Time: 06/03/22  2:09 AM  Result Value Ref Range   Prothrombin Time 27.2 (H) 11.4 - 15.2 seconds   INR 2.6 (H) 0.8 - 1.2    Comment: (NOTE) INR goal varies based on device and disease states. Performed at [facility], [place] [address] [place], [place] [address]   CBC     Status: Abnormal   Collection Time: 06/03/22  2:09 AM  Result Value Ref Range   WBC 6.0 4.0 - 10.5 K/uL   RBC 2.17 (L) 3.87 - 5.11 MIL/uL   Hemoglobin 7.5 (L) 12.0 - 15.0 g/dL   HCT 24.0 (L) 36.0 - 46.0 %   MCV 110.6 (H) 80.0 - 100.0 fL   MCH 34.6 (H) 26.0 - 34.0 pg   MCHC 31.3 30.0 - 36.0 g/dL   RDW 20.3 (H) 11.5 - 15.5 %   Platelets 624 (H) 150 - 400 K/uL   nRBC 0.3 (H) 0.0 - 0.2 %    Comment: Performed at [facility], [place] [address] [place], [place] [address]  Basic metabolic panel     Status: Abnormal   Collection Time: 06/03/22  2:09 AM  Result Value Ref Range   Sodium 141 135 - 145 mmol/L   Potassium 4.0 3.5 - 5.1 mmol/L   Chloride 108 98 - 111 mmol/L   CO2 26 22 - 32 mmol/L   Glucose, Bld 158 (H) 70 - 99 mg/dL    Comment: Glucose reference range applies only to samples taken after fasting for at least 8 hours.   BUN 13 8 - 23 mg/dL   Creatinine, Ser 0.80 0.44 - 1.00 mg/dL   Calcium 8.6 (L) 8.9 - 10.3 mg/dL   GFR, Estimated >60 >60 mL/min    Comment: (NOTE) Calculated using the CKD-EPI Creatinine Equation (2021)    Anion gap 7 5 - 15    Comment: Performed at [facility] [address], [place], [place] [address]  Magnesium     Status: None   Collection Time: 06/03/22  2:09 AM  Result Value Ref Range   Magnesium 1.9 1.7 - 2.4 mg/dL    Comment: Performed at [facility] [address], [place], [place] [address]  Glucose, capillary     Status: Abnormal   Collection Time: 06/03/22  8:06 AM  Result Value Ref Range   Glucose-Capillary 146 (H) 70 - 99 mg/dL    Comment: Glucose reference range applies only to samples taken after fasting for at least 8 hours.    MICRO: reviewed IMAGING: DG FLUORO GUIDED NEEDLE PLC ASPIRATION/INJECTION LOC  Result Date: 06/02/2022 CLINICAL DATA:  Right hip pain. EXAM: RIGHT HIP INJECTION UNDER FLUOROSCOPY COMPARISON:  Right hip radiographs and MRI 06/01/2022  FLUOROSCOPY: Radiation Exposure Index (as provided by the fluoroscopic device): 3.10 mGy [place] PROCEDURE: Telephone consent was obtained from the patient's daughter. Risks and benefits of the procedure were discussed. We were initially asked to perform a right hip aspiration, however MRI demonstrated only a very small joint effusion. A request was later made for a therapeutic  intra-articular steroid injection. A time-out was performed prior to the start of the procedure. Overlying skin prepped with Betadine, draped in the usual sterile fashion, and infiltrated locally with 1% lidocaine. A 22 gauge spinal needle advanced to the lateral aspect of the right femoral head-neck junction. When the stylet was removed, there was spontaneous return of blood-tinged synovial fluid. Approximately 2 mL of this fluid was collected and sent to the lab. Diagnostic injection of iodinated contrast demonstrated intra-articular spread without intravascular component. 80 mg Depo-Medrol and 5 ml Sensorcaine 0.5% were then administered. No immediate complication. IMPRESSION: Technically successful right hip injection under fluoroscopy. Read by: [name], NP Electronically Signed   By: Logan Bores M.D.   On: 06/02/2022 15:18   VAS [place] LOWER EXTREMITY VENOUS (DVT) (ONLY [facility] & [facility])  Result Date: 06/02/2022  Lower Venous DVT Study Patient Name:  [name] [name]  Date of Exam:   06/02/2022 Medical Rec #: [id number]         Accession #:    [id number] Date of Birth: 30-Dec-1945         Patient Gender: F Patient Age:   30 years Exam Location:  [facility] Procedure:      VAS [place] LOWER EXTREMITY VENOUS (DVT) Referring Phys: Judd Gaudier --------------------------------------------------------------------------------  Indications: Pain (hip and groin). Other Indications: Severe degenerative changes of the right hip. Comparison Study: Prior bilateral LEV done 07/07/14 indicating DVT in the                   bilateral lower extremities.  Performing Technologist: Sharion Dove RVS  Examination Guidelines: A complete evaluation includes B-mode imaging, spectral Doppler, color Doppler, and power Doppler as needed of all accessible portions of each vessel. Bilateral testing is considered an integral part of a complete examination. Limited examinations for reoccurring indications may be performed as noted. The reflux portion of the exam is performed with the patient in reverse Trendelenburg.  +---------+---------------+---------+-----------+----------+-------------------+ RIGHT    CompressibilityPhasicitySpontaneityPropertiesThrombus Aging      +---------+---------------+---------+-----------+----------+-------------------+ CFV      Full           Yes      Yes                                      +---------+---------------+---------+-----------+----------+-------------------+ SFJ      Full                                                             +---------+---------------+---------+-----------+----------+-------------------+ FV Prox  Full                                                             +---------+---------------+---------+-----------+----------+-------------------+ FV Mid   Full                                                             +---------+---------------+---------+-----------+----------+-------------------+  FV DistalFull                                                             +---------+---------------+---------+-----------+----------+-------------------+ PFV      Full                                                             +---------+---------------+---------+-----------+----------+-------------------+ POP                     Yes      Yes                  patent by color and                                                       Doppler             +---------+---------------+---------+-----------+----------+-------------------+ PTV      Full                                                              +---------+---------------+---------+-----------+----------+-------------------+ PERO     Full                                                             +---------+---------------+---------+-----------+----------+-------------------+   +----+---------------+---------+-----------+----------+--------------+ LEFTCompressibilityPhasicitySpontaneityPropertiesThrombus Aging +----+---------------+---------+-----------+----------+--------------+ CFV Full           Yes      Yes                                 +----+---------------+---------+-----------+----------+--------------+     Summary: RIGHT: - There is no evidence of deep vein thrombosis in the lower extremity.  - No cystic structure found in the popliteal fossa.  LEFT: - No evidence of common femoral vein obstruction.  *See table(s) above for measurements and observations.    Preliminary    DG Chest Portable 1 View  Result Date: 06/02/2022 CLINICAL DATA:  Altered mental status. EXAM: PORTABLE CHEST 1 VIEW COMPARISON:  Chest radiograph dated 08/14/2021. FINDINGS: Background of emphysema and chronic interstitial coarsening. Diffuse hazy interstitial densities throughout the lungs, right greater left, new since the prior radiograph and may represent mild edema. Atypical pneumonia is not excluded clinical correlation is recommended. No consolidative changes. There is no pleural effusion pneumothorax. Stable cardiac silhouette. Atherosclerotic calcification of the aorta. No acute osseous pathology. IMPRESSION: Diffuse hazy interstitial densities throughout the lungs, right greater left may represent mild edema. Atypical pneumonia is not excluded.  Electronically Signed   By: Anner Crete M.D.   On: 06/02/2022 00:13   MR HIP RIGHT W WO CONTRAST  Result Date: 06/01/2022 CLINICAL DATA:  Hip pain.  Osteonecrosis suspected. EXAM: MRI OF THE RIGHT HIP WITHOUT AND WITH CONTRAST TECHNIQUE:  Multiplanar, multisequence MR imaging was performed both before and after administration of intravenous contrast. CONTRAST:  5.54m GADAVIST GADOBUTROL 1 MMOL/ML IV SOLN COMPARISON:  Pelvis and right hip radiographs 06/01/2022. FINDINGS: Despite efforts by the technologist and patient, moderate to high-grade motion artifact is present on today's exam and could not be eliminated. This reduces exam sensitivity and specificity. Bones: No acute fracture or avascular necrosis is seen within limitations of the motion artifact. Mild pubic symphysis joint space narrowing and peripheral osteophytosis. Articular cartilage and labrum Articular cartilage: Within the limitations of patient motion artifact, there are mild subchondral cystic changes within the superolateral aspect of the humeral head. Likely mild superior femoral head and acetabular cartilage thinning. Labrum: Motion artifact limits evaluation. Suspected anterior superior acetabular labrum degenerative tears. Joint or bursal effusion Joint effusion: Mild right hip joint effusion. No left hip joint effusion. Bursae: Mild fluid within the right subacromial/subdeltoid bursa. Muscles and tendons Muscles and tendons: The origins of the bilateral common hamstring tendons and the rectus femoris tendons are intact. Within the limitations of motion artifact, no muscle tear is seen. Other findings Miscellaneous:   None. IMPRESSION: 1. Unfortunately, motion artifact mildly to markedly technically degrades this examination. 2. No definite avascular necrosis is seen. 3. Mild right hip joint effusion. Superior right acetabular subchondral degenerative cystic change with mild-to-moderate superior right femoroacetabular osteoarthritis. 4. Mild right subacromial/subdeltoid bursitis. Electronically Signed   By: RYvonne KendallM.D.   On: 06/01/2022 22:06   DG Hip Unilat W or Wo Pelvis 2-3 Views Right  Result Date: 06/01/2022 CLINICAL DATA:  Right hip pain EXAM: DG HIP (WITH OR  WITHOUT PELVIS) 2-3V RIGHT COMPARISON:  07/13/2014 FINDINGS: Severe degenerative changes of the right hip joint, progressed from 2015. No evidence of fracture or dislocation. No erosion or periosteal elevation is seen. No appreciable soft tissue abnormality. IMPRESSION: Severe degenerative changes of the right hip, progressed from 2015. Electronically Signed   By: NDavina PokeD.O.   On: 06/01/2022 14:49    Assessment/Plan:  89yoF with acute onset of right hip pain with fevers, arthocentesis c/w hemathrosis but still could be c/w septic arthritis. Given no prior hx of gout, and on going fevers, concern that she does have native right hip septic arthritis. - will start ceftriaxone 2gm IV daily - will start vancomycin dosed per protocol -follow up on cultures -wait to place picc line until we have 48hrs of being fever free  Thrombocytosis = continue on home regimen  +urine cx c/w asymptomatic bacturia

## 2022-06-03 NOTE — Progress Notes (Signed)
ANTICOAGULATION CONSULT NOTE  Pharmacy Consult for Warfarin  Indication:  history of DVT  Patient Measurements: Height: '5\' 2"'$  (157.5 cm) Weight: 53.1 kg (117 lb) IBW/kg (Calculated) : 50.1  Vital Signs: Temp: 98.4 F (36.9 C) (08/01 0440) Temp Source: Oral (08/01 0440) BP: 114/45 (08/01 0440) Pulse Rate: 60 (08/01 0440)  Labs: Recent Labs    06/01/22 1355 06/02/22 0256 06/03/22 0209  HGB 7.9*  --  7.5*  HCT 25.7*  --  24.0*  PLT 791*  --  624*  LABPROT  --  36.4* 27.2*  INR  --  3.7* 2.6*  CREATININE 0.89  --  0.80  CKTOTAL 24*  --   --      Estimated Creatinine Clearance: 47.3 mL/min (by C-G formula based on SCr of 0.8 mg/dL).  Assessment: 76 yo female presented on 06/01/2022. Patient is on warfarin prior to admission for history of DVT and PE. Patient on warfarin '5mg'$  Monday and Friday and 2.'5mg'$  all other days per Care Everywhere note from 05/13/22. Patient's family reported last dose 7/29. INR is therapeutic at 2.6. No bleeding noted.   Goal of Therapy:  INR 2-3 Monitor platelets by anticoagulation protocol: Yes   Plan:  Warfarin 2.'5mg'$  PO x 1 today Daily INR  Tymeshia Awan A. Levada Dy, PharmD, BCPS, FNKF Clinical Pharmacist [place] Please utilize Amion for appropriate phone number to reach the unit pharmacist ([place])  06/03/2022 7:42 AM

## 2022-06-03 NOTE — Progress Notes (Signed)
Physical Therapy Treatment Patient Details Name: BRECKEN WALTH MRN: [id number] DOB: 1946/05/10 Today's Date: 06/03/2022   History of Present Illness Pt is a 76 y/o female admitted secondary to intractable R hip pain. 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 Workup for septic arthritis of R hip. PMH includes thrombocythemia (positive JAK2 mutation), CAD s/p PCI, DM-2, HTN,    PT Comments    Pt reports feeling better today vs yesterday. Pt ambulatory in room with use of at least single limb UE support, pt overall requiring min a for mobility. Pt tolerated RLE exercises to maintain strength and ROM. Pt progressing well, still recommending ST-SNF given pt's PLOF and lives alone.     Recommendations for follow up therapy are one component of a multi-disciplinary discharge planning process, led by the attending physician.  Recommendations may be updated based on patient status, additional functional criteria and insurance authorization.  Follow Up Recommendations  Skilled nursing-short term rehab (<3 hours/day)     Assistance Recommended at Discharge Intermittent Supervision/Assistance  Patient can return home with the following A little help with walking and/or transfers;A little help with bathing/dressing/bathroom;Assistance with cooking/housework;Direct supervision/assist for financial management;Assist for transportation;Help with stairs or ramp for entrance   Equipment Recommendations  Rolling walker (2 wheels);BSC/3in1    Recommendations for Other Services       Precautions / Restrictions Precautions Precautions: Fall Restrictions Weight Bearing Restrictions: No     Mobility  Bed Mobility Overal bed mobility: Needs Assistance       Supine to sit: Supervision Sit to supine: Min assist   General bed mobility comments: assist to lift RLE back into bed    Transfers Overall  transfer level: Needs assistance Equipment used: 1 person hand held assist Transfers: Sit to/from Stand Sit to Stand: Min assist           General transfer comment: light rise and steady assist    Ambulation/Gait Ambulation/Gait assistance: Min guard Gait Distance (Feet): 60 Feet Assistive device: IV Pole, 1 person hand held assist Gait Pattern/deviations: Step-through pattern, Decreased stride length, Antalgic, Decreased weight shift to right, Trunk flexed Gait velocity: decr     General Gait Details: for safety, PT encouraged use of RW to offweight RLE but pt declines. Pt supported by PT initially, transitioning to IV pole   Stairs             Wheelchair Mobility    Modified Rankin (Stroke Patients Only)       Balance Overall balance assessment: Needs assistance Sitting-balance support: Bilateral upper extremity supported, Feet supported Sitting balance-Leahy Scale: Fair     Standing balance support: Bilateral upper extremity supported Standing balance-Leahy Scale: Fair                              Cognition Arousal/Alertness: Awake/alert Behavior During Therapy: WFL for tasks assessed/performed Overall Cognitive Status: Within Functional Limits for tasks assessed                                          Exercises General Exercises - Lower Extremity Quad Sets: AROM, Right, 10 reps, Supine Heel Slides: AROM, Right, 10 reps, Supine Hip ABduction/ADduction: AROM, Right, 5 reps, Supine    General Comments  Pertinent Vitals/Pain Pain Assessment Pain Assessment: Faces Faces Pain Scale: Hurts little more Pain Location: R hip Pain Descriptors / Indicators: Grimacing, Guarding Pain Intervention(s): Limited activity within patient's tolerance, Monitored during session, Repositioned    Home Living                          Prior Function            PT Goals (current goals can now be found in the care  plan section) Acute Rehab PT Goals Patient Stated Goal: to decrease pain PT Goal Formulation: With patient Time For Goal Achievement: 06/16/22 Potential to Achieve Goals: Good Progress towards PT goals: Progressing toward goals    Frequency    Min 3X/week      PT Plan Current plan remains appropriate    Co-evaluation              AM-PAC PT "6 Clicks" Mobility   Outcome Measure  Help needed turning from your back to your side while in a flat bed without using bedrails?: A Little Help needed moving from lying on your back to sitting on the side of a flat bed without using bedrails?: A Little Help needed moving to and from a bed to a chair (including a wheelchair)?: A Little Help needed standing up from a chair using your arms (e.g., wheelchair or bedside chair)?: A Little Help needed to walk in hospital room?: A Little Help needed climbing 3-5 steps with a railing? : A Lot 6 Click Score: 17    End of Session   Activity Tolerance: Patient tolerated treatment well Patient left: in bed;with call bell/phone within reach;with family/visitor present Nurse Communication: Mobility status PT Visit Diagnosis: Unsteadiness on feet (R26.81);Muscle weakness (generalized) (M62.81);Difficulty in walking, not elsewhere classified (R26.2);Ataxic gait (R26.0)     Time: 1547-1610 PT Time Calculation (min) (ACUTE ONLY): 23 min  Charges:  $Therapeutic Activity: 8-22 mins                    Stacie Glaze, PT DPT Acute Rehabilitation Services Pager [phone]  Office [phone]]   Louis Matte 06/03/2022, 4:31 PM

## 2022-06-03 NOTE — NC FL2 (Signed)
[place] LEVEL OF CARE SCREENING TOOL     IDENTIFICATION  Patient Name: [name] [name] Birthdate: Mar 13, 1946 Sex: female Admission Date (Current Location): 06/01/2022  [facility] and [place] Number:  Herbalist and Address:  [facility], [place] [address] [place], [place] [address]      Provider Number: [id number]  Attending Physician Name and Address:  Jonetta Osgood, MD  Relative Name and Phone Number:       Current Level of Care: SNF Recommended Level of Care: [place] Prior Approval Number:    Date Approved/Denied:   PASRR Number: [id number]  Discharge Plan: SNF    Current Diagnoses: Patient Active Problem List   Diagnosis Date Noted   Pressure injury of skin 06/03/2022   Delirium due to multiple etiologies, acute, hyperactive 06/02/2022   Right hip joint effusion 06/02/2022   Memory loss    Chronic anticoagulation    History of seizure    Hypoxia    Right hip pain 06/01/2022   Ambulatory dysfunction 06/01/2022   Essential thrombocythemia ([place]) 10/05/2019   Nausea without vomiting 10/05/2019   Encounter for antineoplastic chemotherapy 09/21/2019   Goals of care, counseling/discussion 09/21/2019   Chronic anemia 09/07/2019   Leukocytosis 09/07/2019   Mixed hyperlipidemia 09/01/2016   DVT (deep venous thrombosis) ([place]) 09/18/2014   Drug-induced hypersensitivity reaction 06/28/2012   Fever 06/28/2012   Lactic acidosis 06/28/2012   Hand abscess 06/28/2012   Hyponatremia 06/28/2012   Hypertension 06/28/2012   DM2 (diabetes mellitus, type 2) ([place]) 06/28/2012   Petechiae 06/28/2012   CAD (coronary artery disease) 04/28/2011    Orientation RESPIRATION BLADDER Height & Weight     Self, Time, Situation, Place  O2 (3L) Incontinent, External catheter Weight: 117 lb (53.1 kg) Height:  '5\' 2"'$  (157.5 cm)  BEHAVIORAL SYMPTOMS/MOOD NEUROLOGICAL BOWEL NUTRITION STATUS      Continent Diet (See  discharge summary.)  AMBULATORY STATUS COMMUNICATION OF NEEDS Skin   Extensive Assist Verbally Normal                       Personal Care Assistance Level of Assistance  Bathing, Feeding, Dressing Bathing Assistance: Maximum assistance Feeding assistance: Maximum assistance Dressing Assistance: Maximum assistance     Functional Limitations Info  Sight, Hearing Sight Info: Impaired Hearing Info: Adequate      SPECIAL CARE FACTORS FREQUENCY  PT (By licensed PT), OT (By licensed OT)     PT Frequency: 5x/week OT Frequency: 5x/week            Contractures Contractures Info: Not present    Additional Factors Info  Code Status, Allergies, Insulin Sliding Scale Code Status Info: Partial code Allergies Info: Ampicillin, Avelox, Bactrim, Ibuprofen, Penicillins, Diphenhydramine, Sulfa Antibiotics, Coeine, Latex, Whey   Insulin Sliding Scale Info: Insulin aspart -0-15 units 3x/day with meals       Current Medications (06/03/2022):  This is the current hospital active medication list Current Facility-Administered Medications  Medication Dose Route Frequency Provider Last Rate Last Admin   acetaminophen (TYLENOL) tablet 1,000 mg  1,000 mg Oral Q8H Ghimire, Shanker M, MD   1,000 mg at 06/03/22 1356   anagrelide (AGRYLIN) capsule 0.5 mg  0.5 mg Oral Daily Judd Gaudier V, MD   0.5 mg at 06/03/22 6962   cefTRIAXone (ROCEPHIN) 2 g in sodium chloride 0.9 % 100 mL IVPB  2 g Intravenous Q24H Carlyle Basques, MD 200 mL/hr at 06/03/22 1547 2 g at  06/03/22 1547   gadobutrol (GADAVIST) 1 MMOL/ML injection 0.05 mL  0.05 mL Other Once PRN Jonetta Osgood, MD       hydroxyurea (HYDREA) capsule 500 mg  500 mg Oral Daily Ghimire, Shanker M, MD   500 mg at 06/03/22 1356   insulin aspart (novoLOG) injection 0-15 Units  0-15 Units Subcutaneous TID WC Athena Masse, MD   2 Units at 06/03/22 1357   insulin aspart (novoLOG) injection 0-5 Units  0-5 Units Subcutaneous QHS Athena Masse, MD        levETIRAcetam (KEPPRA) tablet 250 mg  250 mg Oral BID Judd Gaudier V, MD   250 mg at 06/03/22 3009   morphine (PF) 2 MG/ML injection 1 mg  1 mg Intravenous Q4H PRN Jonetta Osgood, MD       ondansetron [facility]) tablet 4 mg  4 mg Oral Q6H PRN Athena Masse, MD       Or   ondansetron [facility]) injection 4 mg  4 mg Intravenous Q6H PRN Athena Masse, MD       oxyCODONE (Oxy IR/ROXICODONE) immediate release tablet 5 mg  5 mg Oral Q6H PRN Jonetta Osgood, MD   5 mg at 06/03/22 0821   pantoprazole (PROTONIX) EC tablet 40 mg  40 mg Oral Daily Judd Gaudier V, MD   40 mg at 06/03/22 2330   rosuvastatin (CRESTOR) tablet 10 mg  10 mg Oral QPM Athena Masse, MD       vancomycin (VANCOCIN) IVPB 1000 mg/200 mL premix  1,000 mg Intravenous Once Rolla Flatten, [facility]       [START ON 06/04/2022] vancomycin (VANCOREADY) IVPB 750 mg/150 mL  750 mg Intravenous Q24H Rolla Flatten, [facility]       Warfarin - Pharmacist Dosing Inpatient   Does not apply q1600 [place], Valeda Malm [facility]         Discharge Medications: Please see discharge summary for a list of discharge medications.  Relevant Imaging Results:  Relevant Lab Results:   Additional Information SSN: [id number]; 117 lbs  Benard Halsted, LCSW

## 2022-06-03 NOTE — Progress Notes (Addendum)
PROGRESS NOTE        PATIENT DETAILS Name: [name] [name] Age: 76 y.o. Sex: female Date of Birth: Oct 13, 1946 Admit Date: 06/01/2022 Admitting Physician [name] [name], MD GQB:[id number], [name], MD  Brief Summary: Patient is a 76 y.o.  female with history of essential thrombocythemia (positive JAK2 mutation), CAD s/p PCI to LAD/LCx, DM-2, HTN, chronic cognitive dysfunction who presented with worsening intractable right hip pain.   Significant events: 7/30>> to ED with severe right hip pain-significantly confused after MRI.  Admit to [facility].  Unable to ambulate/bear weight due to pain. 7/31>> discussed with radiology-not enough effusion for diagnostic arthrocentesis.  Discussed with orthopedics [name], PA-C)-recommended intra-articular steroids.  IR reconsulted for intra-articular steroid injection-but during right hip injection during fluoroscopy-IR was able to get some synovial fluid out.  Significant studies: 7/30>> x-ray right hip/pelvis: Severe degenerative changes of the right hip-progressed from 2015. 7/30>> MRI right hip: No avascular necrosis-mild right hip joint effusion.  Mild to moderate superior right femoral acetabular osteoarthritis, right subacromial.  Mild right subacromial/subdeltoid bursitis. 7/30>> synovial fluid-cell count 23,600-no crystals.  Significant microbiology data: 7/30>> blood culture: Pending 7/30>> urine culture: E. coli/Staph epidermidis (likely contamination) 7/31>> synovial fluid culture: No growth  Procedures: 7/31>> fluoroscopic guided arthrocentesis and intra-articular steroid injection to right hip.  Consults: Phone consult-orthopedics Interventional radiology ID  Subjective: Febrile last night.  Some improvement in her right hip pain.  Appears comfortable.  Per daughter [name] at bedside-patient had some scattered macular rash approximately 2 weeks ago to her abdominal area.  Objective: Vitals: Blood  pressure (!) 98/47, pulse 65, temperature 97.8 F (36.6 C), temperature source Oral, resp. rate 15, height '5\' 2"'$  (1.575 m), weight 53.1 kg, SpO2 95 %.   Exam: Gen Exam:Alert awake-not in any distress HEENT:atraumatic, normocephalic Chest: B/L clear to auscultation anteriorly CVS:S1S2 regular Abdomen:soft non tender, non distended Extremities:no edema Neurology: Non focal Skin: no rash   Pertinent Labs/Radiology:    Latest Ref Rng & Units 06/03/2022    2:09 AM 06/01/2022    1:55 PM 05/20/2022    1:25 PM  CBC  WBC 4.0 - 10.5 K/uL 6.0  6.1  4.9   Hemoglobin 12.0 - 15.0 g/dL 7.5  7.9  7.9   Hematocrit 36.0 - 46.0 % 24.0  25.7  24.7   Platelets 150 - 400 K/uL 624  791  617     Lab Results  Component Value Date   NA 141 06/03/2022   K 4.0 06/03/2022   CL 108 06/03/2022   CO2 26 06/03/2022      Assessment/Plan: Fever-severe right hip pain: Febrile last night-right hip pain better.  Overall picture is concerning for septic arthritis-however cell count on synovial fluid borderline elevated-cultures negative so far.  Apparently has had tick bite in the past-had a rash around the abdomen around 2 weeks back that has spontaneously resolved.  Will send out tickborne illness serology.  Check inflammatory markers.  Have consulted infectious disease as well.  Given overall stability-continue to monitor of antimicrobial therapy for now.  Acute metabolic/toxic encephalopathy: Occurred after she was given sedatives for MRI on admission.  This has resolved-she is awake and alert and at baseline.  Given history of cognitive issues-expect some amount of delirium during hospitalization.  Maintain delirium precautions.    Seizure disorder: Continue Keppra.  History of essential thrombocythemia: Continue anagrelide/Hydrea-follow  CBC-resume outpatient follow-up with Dr. [name] postdischarge.  Macrocytic anemia/fluctuating leukopenia: Due to myeloproliferative disorder from essential thrombocytopenia.   Continue to follow.  History of VTE: Continue with Coumadin per pharmacy-INR therapeutic.  History of CAD: No anginal symptoms-follows with Dr. [name] statin/nitrates.  DM-2 (A1c 5.6 on 7/31): CBG stable with SSI.  Resume metformin on discharge.  Recent Labs    06/02/22 1755 06/02/22 2117 06/03/22 0806  GLUCAP 126* 167* 146*     GERD: Continue PPI  BMI: Estimated body mass index is 21.4 kg/m as calculated from the following:   Height as of this encounter: '5\' 2"'$  (1.575 m).   Weight as of this encounter: 53.1 kg.   Code status:   Code Status: Partial Code   DVT Prophylaxis: Coumadin with therapeutic INR.  Family Communication:Daughter at bedside   Disposition Plan: Status is: Observation The patient will require care spanning > 2 midnights and should be moved to inpatient because: Fever-improving right hip pain-concern for septic arthritis-work-up in progress..   Planned Discharge Destination:Home health   Diet: Diet Order             Diet heart healthy/carb modified Room service appropriate? Yes; Fluid consistency: Thin  Diet effective now                     Antimicrobial agents: Anti-infectives (From admission, onward)    None        MEDICATIONS: Scheduled Meds:  acetaminophen  1,000 mg Oral Q8H   anagrelide  0.5 mg Oral Daily   hydroxyurea  500 mg Oral Daily   insulin aspart  0-15 Units Subcutaneous TID WC   insulin aspart  0-5 Units Subcutaneous QHS   levETIRAcetam  250 mg Oral BID   pantoprazole  40 mg Oral Daily   rosuvastatin  10 mg Oral QPM   warfarin  2.5 mg Oral ONCE-1600   Warfarin - Pharmacist Dosing Inpatient   Does not apply q1600   Continuous Infusions:   PRN Meds:.gadobutrol, morphine injection, ondansetron **OR** ondansetron (ZOFRAN) IV, oxyCODONE   I have personally reviewed following labs and imaging studies  LABORATORY DATA: CBC: Recent Labs  Lab 06/01/22 1355 06/03/22 0209  WBC 6.1 6.0  NEUTROABS  5.3  --   HGB 7.9* 7.5*  HCT 25.7* 24.0*  MCV 110.8* 110.6*  PLT 791* 624*     Basic Metabolic Panel: Recent Labs  Lab 06/01/22 1355 06/03/22 0209  NA 139 141  K 3.9 4.0  CL 108 108  CO2 25 26  GLUCOSE 117* 158*  BUN 16 13  CREATININE 0.89 0.80  CALCIUM 8.8* 8.6*  MG  --  1.9     GFR: Estimated Creatinine Clearance: 47.3 mL/min (by C-G formula based on SCr of 0.8 mg/dL).  Liver Function Tests: Recent Labs  Lab 06/01/22 1355  AST 20  ALT 11  ALKPHOS 34*  BILITOT 0.9  PROT 5.7*  ALBUMIN 3.6    No results for input(s): "LIPASE", "AMYLASE" in the last 168 hours. No results for input(s): "AMMONIA" in the last 168 hours.  Coagulation Profile: Recent Labs  Lab 06/02/22 0256 06/03/22 0209  INR 3.7* 2.6*     Cardiac Enzymes: Recent Labs  Lab 06/01/22 1355  CKTOTAL 24*     BNP (last 3 results) No results for input(s): "PROBNP" in the last 8760 hours.  Lipid Profile: No results for input(s): "CHOL", "HDL", "LDLCALC", "TRIG", "CHOLHDL", "LDLDIRECT" in the last 72 hours.  Thyroid Function Tests: No results for  input(s): "TSH", "T4TOTAL", "FREET4", "T3FREE", "THYROIDAB" in the last 72 hours.  Anemia Panel: No results for input(s): "VITAMINB12", "FOLATE", "FERRITIN", "TIBC", "IRON", "RETICCTPCT" in the last 72 hours.  Urine analysis:    Component Value Date/Time   COLORURINE YELLOW 06/01/2022 2346   APPEARANCEUR CLEAR 06/01/2022 2346   LABSPEC 1.016 06/01/2022 2346   PHURINE 6.0 06/01/2022 2346   GLUCOSEU NEGATIVE 06/01/2022 2346   HGBUR NEGATIVE 06/01/2022 2346   BILIRUBINUR NEGATIVE 06/01/2022 2346   KETONESUR 5 (A) 06/01/2022 2346   PROTEINUR NEGATIVE 06/01/2022 2346   UROBILINOGEN 1.0 06/28/2012 1310   NITRITE NEGATIVE 06/01/2022 2346   LEUKOCYTESUR SMALL (A) 06/01/2022 2346    Sepsis Labs: Lactic Acid, Venous    Component Value Date/Time   LATICACIDVEN 1.0 06/01/2022 1755    MICROBIOLOGY: Recent Results (from the past 240 hour(s))   Blood culture (routine x 2)     Status: None (Preliminary result)   Collection Time: 06/01/22  3:40 PM   Specimen: BLOOD RIGHT FOREARM  Result Value Ref Range Status   Specimen Description BLOOD RIGHT FOREARM  Final   Special Requests   Final    BOTTLES DRAWN AEROBIC AND ANAEROBIC Blood Culture results may not be optimal due to an inadequate volume of blood received in culture bottles   Culture   Final    NO GROWTH 2 DAYS Performed at [facility], [place] [address], [place], [place] [address]    Report Status PENDING  Incomplete  Blood culture (routine x 2)     Status: None (Preliminary result)   Collection Time: 06/01/22  5:55 PM   Specimen: BLOOD  Result Value Ref Range Status   Specimen Description BLOOD LEFT ANTECUBITAL  Final   Special Requests   Final    BOTTLES DRAWN AEROBIC AND ANAEROBIC Blood Culture adequate volume   Culture   Final    NO GROWTH 2 DAYS Performed at [facility], [place] [address] [place], [place] [address]    Report Status PENDING  Incomplete  Urine Culture     Status: Abnormal (Preliminary result)   Collection Time: 06/01/22 11:03 PM   Specimen: Urine, Clean Catch  Result Value Ref Range Status   Specimen Description URINE, CLEAN CATCH  Final   Special Requests NONE  Final   Culture (A)  Final    40,000 COLONIES/mL GRAM NEGATIVE RODS IDENTIFICATION AND SUSCEPTIBILITIES TO FOLLOW CULTURE REINCUBATED FOR BETTER GROWTH Performed at [facility], [place] [address], [place], [place] [address]    Report Status PENDING  Incomplete  Anaerobic culture w Gram Stain     Status: None (Preliminary result)   Collection Time: 06/02/22  2:37 PM   Specimen: PATH Cytology Misc. fluid; Synovial Fluid  Result Value Ref Range Status   Specimen Description SYNOVIAL  Final   Special Requests RIGHT HIP  Final   Gram Stain   Final    ABUNDANT WBC PRESENT,BOTH PMN AND MONONUCLEAR NO ORGANISMS SEEN Performed at [facility], [address],  [place], [place] [address]    Culture PENDING  Incomplete   Report Status PENDING  Incomplete  Body fluid culture w Gram Stain     Status: None (Preliminary result)   Collection Time: 06/02/22  2:37 PM   Specimen: PATH Cytology Misc. fluid; Synovial Fluid  Result Value Ref Range Status   Specimen Description SYNOVIAL  Final   Special Requests RIGHT HIP  Final   Gram Stain   Final  FEW WBC PRESENT, PREDOMINANTLY MONONUCLEAR NO ORGANISMS SEEN    Culture   Final    NO GROWTH < 24 HOURS Performed at [facility] [address], [place], [place] [address]    Report Status PENDING  Incomplete    RADIOLOGY STUDIES/RESULTS: DG FLUORO GUIDED NEEDLE PLC ASPIRATION/INJECTION LOC  Result Date: 06/02/2022 CLINICAL DATA:  Right hip pain. EXAM: RIGHT HIP INJECTION UNDER FLUOROSCOPY COMPARISON:  Right hip radiographs and MRI 06/01/2022 FLUOROSCOPY: Radiation Exposure Index (as provided by the fluoroscopic device): 3.10 mGy Kerma PROCEDURE: Telephone consent was obtained from the patient's daughter. Risks and benefits of the procedure were discussed. We were initially asked to perform a right hip aspiration, however MRI demonstrated only a very small joint effusion. A request was later made for a therapeutic intra-articular steroid injection. A time-out was performed prior to the start of the procedure. Overlying skin prepped with Betadine, draped in the usual sterile fashion, and infiltrated locally with 1% lidocaine. A 22 gauge spinal needle advanced to the lateral aspect of the right femoral head-neck junction. When the stylet was removed, there was spontaneous return of blood-tinged synovial fluid. Approximately 2 mL of this fluid was collected and sent to the lab. Diagnostic injection of iodinated contrast demonstrated intra-articular spread without intravascular component. 80 mg Depo-Medrol and 5 ml Sensorcaine 0.5% were then administered. No immediate complication. IMPRESSION: Technically successful  right hip injection under fluoroscopy. Read by: [name], NP Electronically Signed   By: Logan Bores M.D.   On: 06/02/2022 15:18   VAS [place] LOWER EXTREMITY VENOUS (DVT) (ONLY [facility] & [facility])  Result Date: 06/02/2022  Lower Venous DVT Study Patient Name:  JASKIRAT ZERTUCHE  Date of Exam:   06/02/2022 Medical Rec #: [id number]         Accession #:    [id number] Date of Birth: 1946-02-15         Patient Gender: F Patient Age:   52 years Exam Location:  [facility] Procedure:      VAS [place] LOWER EXTREMITY VENOUS (DVT) Referring Phys: Judd Gaudier --------------------------------------------------------------------------------  Indications: Pain (hip and groin). Other Indications: Severe degenerative changes of the right hip. Comparison Study: Prior bilateral LEV done 07/07/14 indicating DVT in the                   bilateral lower extremities. Performing Technologist: Sharion Dove RVS  Examination Guidelines: A complete evaluation includes B-mode imaging, spectral Doppler, color Doppler, and power Doppler as needed of all accessible portions of each vessel. Bilateral testing is considered an integral part of a complete examination. Limited examinations for reoccurring indications may be performed as noted. The reflux portion of the exam is performed with the patient in reverse Trendelenburg.  +---------+---------------+---------+-----------+----------+-------------------+ RIGHT    CompressibilityPhasicitySpontaneityPropertiesThrombus Aging      +---------+---------------+---------+-----------+----------+-------------------+ CFV      Full           Yes      Yes                                      +---------+---------------+---------+-----------+----------+-------------------+ SFJ      Full                                                             +---------+---------------+---------+-----------+----------+-------------------+  FV Prox  Full                                                              +---------+---------------+---------+-----------+----------+-------------------+ FV Mid   Full                                                             +---------+---------------+---------+-----------+----------+-------------------+ FV DistalFull                                                             +---------+---------------+---------+-----------+----------+-------------------+ PFV      Full                                                             +---------+---------------+---------+-----------+----------+-------------------+ POP                     Yes      Yes                  patent by color and                                                       Doppler             +---------+---------------+---------+-----------+----------+-------------------+ PTV      Full                                                             +---------+---------------+---------+-----------+----------+-------------------+ PERO     Full                                                             +---------+---------------+---------+-----------+----------+-------------------+   +----+---------------+---------+-----------+----------+--------------+ LEFTCompressibilityPhasicitySpontaneityPropertiesThrombus Aging +----+---------------+---------+-----------+----------+--------------+ CFV Full           Yes      Yes                                 +----+---------------+---------+-----------+----------+--------------+     Summary: RIGHT: - There is no evidence of deep vein thrombosis in the lower extremity.  - No cystic structure found in the  popliteal fossa.  LEFT: - No evidence of common femoral vein obstruction.  *See table(s) above for measurements and observations.    Preliminary    DG Chest Portable 1 View  Result Date: 06/02/2022 CLINICAL DATA:  Altered mental status. EXAM: PORTABLE CHEST 1 VIEW COMPARISON:  Chest radiograph dated 08/14/2021.  FINDINGS: Background of emphysema and chronic interstitial coarsening. Diffuse hazy interstitial densities throughout the lungs, right greater left, new since the prior radiograph and may represent mild edema. Atypical pneumonia is not excluded clinical correlation is recommended. No consolidative changes. There is no pleural effusion pneumothorax. Stable cardiac silhouette. Atherosclerotic calcification of the aorta. No acute osseous pathology. IMPRESSION: Diffuse hazy interstitial densities throughout the lungs, right greater left may represent mild edema. Atypical pneumonia is not excluded. Electronically Signed   By: Anner Crete M.D.   On: 06/02/2022 00:13   MR HIP RIGHT W WO CONTRAST  Result Date: 06/01/2022 CLINICAL DATA:  Hip pain.  Osteonecrosis suspected. EXAM: MRI OF THE RIGHT HIP WITHOUT AND WITH CONTRAST TECHNIQUE: Multiplanar, multisequence MR imaging was performed both before and after administration of intravenous contrast. CONTRAST:  5.21m GADAVIST GADOBUTROL 1 MMOL/ML IV SOLN COMPARISON:  Pelvis and right hip radiographs 06/01/2022. FINDINGS: Despite efforts by the technologist and patient, moderate to high-grade motion artifact is present on today's exam and could not be eliminated. This reduces exam sensitivity and specificity. Bones: No acute fracture or avascular necrosis is seen within limitations of the motion artifact. Mild pubic symphysis joint space narrowing and peripheral osteophytosis. Articular cartilage and labrum Articular cartilage: Within the limitations of patient motion artifact, there are mild subchondral cystic changes within the superolateral aspect of the humeral head. Likely mild superior femoral head and acetabular cartilage thinning. Labrum: Motion artifact limits evaluation. Suspected anterior superior acetabular labrum degenerative tears. Joint or bursal effusion Joint effusion: Mild right hip joint effusion. No left hip joint effusion. Bursae: Mild fluid within  the right subacromial/subdeltoid bursa. Muscles and tendons Muscles and tendons: The origins of the bilateral common hamstring tendons and the rectus femoris tendons are intact. Within the limitations of motion artifact, no muscle tear is seen. Other findings Miscellaneous:   None. IMPRESSION: 1. Unfortunately, motion artifact mildly to markedly technically degrades this examination. 2. No definite avascular necrosis is seen. 3. Mild right hip joint effusion. Superior right acetabular subchondral degenerative cystic change with mild-to-moderate superior right femoroacetabular osteoarthritis. 4. Mild right subacromial/subdeltoid bursitis. Electronically Signed   By: RYvonne KendallM.D.   On: 06/01/2022 22:06   DG Hip Unilat W or Wo Pelvis 2-3 Views Right  Result Date: 06/01/2022 CLINICAL DATA:  Right hip pain EXAM: DG HIP (WITH OR WITHOUT PELVIS) 2-3V RIGHT COMPARISON:  07/13/2014 FINDINGS: Severe degenerative changes of the right hip joint, progressed from 2015. No evidence of fracture or dislocation. No erosion or periosteal elevation is seen. No appreciable soft tissue abnormality. IMPRESSION: Severe degenerative changes of the right hip, progressed from 2015. Electronically Signed   By: NDavina PokeD.O.   On: 06/01/2022 14:49     LOS: 1 day   SOren Binet MD  [facility]    To contact the attending provider between 7A-7P or the covering provider during after hours 7P-7A, please log into the web site [email or web address] and access using universal [place] password for that web site. If you do not have the password, please call the hospital operator.  06/03/2022, 10:56 AM

## 2022-06-04 DIAGNOSIS — D649 Anemia, unspecified: Secondary | ICD-10-CM | POA: Diagnosis not present

## 2022-06-04 DIAGNOSIS — R262 Difficulty in walking, not elsewhere classified: Secondary | ICD-10-CM | POA: Diagnosis not present

## 2022-06-04 DIAGNOSIS — D75839 Thrombocytosis, unspecified: Secondary | ICD-10-CM | POA: Diagnosis not present

## 2022-06-04 DIAGNOSIS — M25551 Pain in right hip: Secondary | ICD-10-CM | POA: Diagnosis not present

## 2022-06-04 DIAGNOSIS — M00851 Arthritis due to other bacteria, right hip: Secondary | ICD-10-CM | POA: Diagnosis not present

## 2022-06-04 DIAGNOSIS — Z7901 Long term (current) use of anticoagulants: Secondary | ICD-10-CM | POA: Diagnosis not present

## 2022-06-04 DIAGNOSIS — I2510 Atherosclerotic heart disease of native coronary artery without angina pectoris: Secondary | ICD-10-CM | POA: Diagnosis not present

## 2022-06-04 LAB — CBC
HCT: 22.2 % — ABNORMAL LOW (ref 36.0–46.0)
Hemoglobin: 6.8 g/dL — CL (ref 12.0–15.0)
MCH: 33.7 pg (ref 26.0–34.0)
MCHC: 30.6 g/dL (ref 30.0–36.0)
MCV: 109.9 fL — ABNORMAL HIGH (ref 80.0–100.0)
Platelets: 596 10*3/uL — ABNORMAL HIGH (ref 150–400)
RBC: 2.02 MIL/uL — ABNORMAL LOW (ref 3.87–5.11)
RDW: 20.9 % — ABNORMAL HIGH (ref 11.5–15.5)
WBC: 4.9 10*3/uL (ref 4.0–10.5)
nRBC: 0.4 % — ABNORMAL HIGH (ref 0.0–0.2)

## 2022-06-04 LAB — PROTIME-INR
INR: 1.7 — ABNORMAL HIGH (ref 0.8–1.2)
Prothrombin Time: 20 seconds — ABNORMAL HIGH (ref 11.4–15.2)

## 2022-06-04 LAB — ANA W/REFLEX IF POSITIVE: Anti Nuclear Antibody (ANA): NEGATIVE

## 2022-06-04 LAB — GLUCOSE, CAPILLARY
Glucose-Capillary: 110 mg/dL — ABNORMAL HIGH (ref 70–99)
Glucose-Capillary: 155 mg/dL — ABNORMAL HIGH (ref 70–99)
Glucose-Capillary: 109 mg/dL — ABNORMAL HIGH (ref 70–99)
Glucose-Capillary: 124 mg/dL — ABNORMAL HIGH (ref 70–99)

## 2022-06-04 LAB — PREPARE RBC (CROSSMATCH)

## 2022-06-04 LAB — ROCKY MTN SPOTTED FVR ABS PNL(IGG+IGM)
RMSF IgG: NEGATIVE
RMSF IgM: 0.3 index (ref 0.00–0.89)

## 2022-06-04 LAB — LYME DISEASE SEROLOGY W/REFLEX: Lyme Total Antibody EIA: NEGATIVE

## 2022-06-04 MED ORDER — DIPHENHYDRAMINE HCL 50 MG/ML IJ SOLN
25.0000 mg | Freq: Once | INTRAMUSCULAR | Status: DC
  Filled 2022-06-04: qty 1

## 2022-06-04 MED ORDER — WARFARIN SODIUM 5 MG PO TABS
5.0000 mg | ORAL_TABLET | ORAL | Status: AC
  Administered 2022-06-04: 5 mg via ORAL
  Filled 2022-06-04: qty 1

## 2022-06-04 MED ORDER — DICLOFENAC SODIUM 1 % EX GEL
2.0000 g | Freq: Four times a day (QID) | CUTANEOUS | Status: DC
  Administered 2022-06-04 – 2022-06-06 (×9): 2 g via TOPICAL
  Filled 2022-06-04: qty 100

## 2022-06-04 MED ORDER — ACETAMINOPHEN 325 MG PO TABS
650.0000 mg | ORAL_TABLET | Freq: Once | ORAL | Status: AC
  Administered 2022-06-04: 650 mg via ORAL
  Filled 2022-06-04: qty 2

## 2022-06-04 MED ORDER — SODIUM CHLORIDE 0.9% IV SOLUTION: Freq: Once | INTRAVENOUS | Status: AC

## 2022-06-04 MED ORDER — FUROSEMIDE 10 MG/ML IJ SOLN
20.0000 mg | Freq: Once | INTRAMUSCULAR | Status: AC
  Administered 2022-06-04: 20 mg via INTRAVENOUS
  Filled 2022-06-04: qty 2

## 2022-06-04 NOTE — Progress Notes (Signed)
Physical Therapy Treatment Patient Details Name: [name] [name] MRN: [id number] DOB: Apr 03, 1946 Today's Date: 06/04/2022   History of Present Illness Pt is a 76 y/o female admitted secondary to intractable R hip pain. 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 Workup for septic arthritis of R hip. PMH includes thrombocythemia (positive JAK2 mutation), CAD s/p PCI, DM-2, HTN,    PT Comments    Pt reporting improved pain today, although is having some in R knee and ankle. Pt ambulatory in hallway with use of RW, PT encouraged AD use for offweighting RLE and decreasing antalgic gait. Pt and family desire d/c to [facility] to return pt to PLOF, recommendation provided below.     Recommendations for follow up therapy are one component of a multi-disciplinary discharge planning process, led by the attending physician.  Recommendations may be updated based on patient status, additional functional criteria and insurance authorization.  Follow Up Recommendations  Skilled nursing-short term rehab (<3 hours/day) Can patient physically be transported by private vehicle: Yes   Assistance Recommended at Discharge Intermittent Supervision/Assistance  Patient can return home with the following A little help with walking and/or transfers;A little help with bathing/dressing/bathroom;Assistance with cooking/housework;Direct supervision/assist for financial management;Assist for transportation;Help with stairs or ramp for entrance   Equipment Recommendations  Rolling walker (2 wheels);BSC/3in1    Recommendations for Other Services       Precautions / Restrictions Precautions Precautions: Fall Restrictions Weight Bearing Restrictions: No     Mobility  Bed Mobility Overal bed mobility: Needs Assistance         Sit to supine: Min assist, HOB elevated   General bed mobility comments: assist for LEs,  positioning    Transfers Overall transfer level: Needs assistance Equipment used: None Transfers: Sit to/from Stand Sit to Stand: Min guard           General transfer comment: for safety    Ambulation/Gait Ambulation/Gait assistance: Min guard Gait Distance (Feet): 100 Feet Assistive device: Rolling walker (2 wheels) Gait Pattern/deviations: Step-through pattern, Decreased stride length, Antalgic, Decreased weight shift to right, Trunk flexed Gait velocity: decr     General Gait Details: for safety, cues for upright posture, placement in RW. R reverse trendelenburg gait noted, as well as increasing antalgic gait with further distance.   Stairs             Wheelchair Mobility    Modified Rankin (Stroke Patients Only)       Balance Overall balance assessment: Needs assistance Sitting-balance support: Bilateral upper extremity supported, Feet supported Sitting balance-Leahy Scale: Fair     Standing balance support: Bilateral upper extremity supported Standing balance-Leahy Scale: Fair                              Cognition Arousal/Alertness: Awake/alert Behavior During Therapy: WFL for tasks assessed/performed Overall Cognitive Status: Within Functional Limits for tasks assessed                                          Exercises General Exercises - Lower Extremity Hip Flexion/Marching: Both, AROM, 5 reps, Standing Mini-Sqauts: AROM, Both, 10 reps, Seated, Standing    General Comments        Pertinent Vitals/Pain Pain Assessment Pain Assessment: Faces Faces Pain Scale:  Hurts little more Pain Location: R hip, R ankle Pain Descriptors / Indicators: Grimacing, Guarding Pain Intervention(s): Limited activity within patient's tolerance, Monitored during session, Repositioned    Home Living                          Prior Function            PT Goals (current goals can now be found in the care plan  section) Acute Rehab PT Goals Patient Stated Goal: to decrease pain PT Goal Formulation: With patient Time For Goal Achievement: 06/16/22 Potential to Achieve Goals: Good Progress towards PT goals: Progressing toward goals    Frequency    Min 3X/week      PT Plan Current plan remains appropriate    Co-evaluation              AM-PAC PT "6 Clicks" Mobility   Outcome Measure  Help needed turning from your back to your side while in a flat bed without using bedrails?: A Little Help needed moving from lying on your back to sitting on the side of a flat bed without using bedrails?: A Little Help needed moving to and from a bed to a chair (including a wheelchair)?: A Little Help needed standing up from a chair using your arms (e.g., wheelchair or bedside chair)?: A Little Help needed to walk in hospital room?: A Little Help needed climbing 3-5 steps with a railing? : A Lot 6 Click Score: 17    End of Session   Activity Tolerance: Patient tolerated treatment well Patient left: in bed;with call bell/phone within reach;with family/visitor present Nurse Communication: Mobility status PT Visit Diagnosis: Unsteadiness on feet (R26.81);Muscle weakness (generalized) (M62.81);Difficulty in walking, not elsewhere classified (R26.2);Ataxic gait (R26.0)     Time: 3664-4034 PT Time Calculation (min) (ACUTE ONLY): 19 min  Charges:  $Gait Training: 8-22 mins                    Stacie Glaze, PT DPT Acute Rehabilitation Services Pager [phone]  Office [phone]    [place] 06/04/2022, 2:52 PM

## 2022-06-04 NOTE — Progress Notes (Signed)
[place] for Infectious Disease    Date of Admission:  06/01/2022   Total days of antibiotics 2 vanco/ceftriaxone   ID: [name] [name] is a 76 y.o. female with  oa vs septic arthritis of right hip Principal Problem:   Ambulatory dysfunction Active Problems:   CAD (coronary artery disease)   DM2 (diabetes mellitus, type 2) (HCC)   Chronic anemia   Essential thrombocythemia ([place])   Right hip pain   Delirium due to multiple etiologies, acute, hyperactive   Right hip joint effusion   Memory loss   Chronic anticoagulation   History of seizure   Hypoxia   Pressure injury of skin    Subjective: Patient reports less hip pain. Eating well. Receiving 1 u RBC due to anemia this morning  Medications:   acetaminophen  1,000 mg Oral Q8H   anagrelide  0.5 mg Oral Daily   diclofenac Sodium  2 g Topical QID   diphenhydrAMINE  25 mg Intravenous Once   hydroxyurea  500 mg Oral Daily   insulin aspart  0-15 Units Subcutaneous TID WC   insulin aspart  0-5 Units Subcutaneous QHS   levETIRAcetam  250 mg Oral BID   pantoprazole  40 mg Oral Daily   rosuvastatin  10 mg Oral QPM   Warfarin - Pharmacist Dosing Inpatient   Does not apply q1600    Objective: Vital signs in last 24 hours: Temp:  [97.8 F (36.6 C)-98.3 F (36.8 C)] 97.9 F (36.6 C) (08/02 1222) Pulse Rate:  [65-80] 71 (08/02 1222) Resp:  [16-18] 18 (08/02 1222) BP: (104-121)/(44-54) 104/44 (08/02 1222) SpO2:  [96 %-98 %] 97 % (08/02 1222)  Physical Exam  Constitutional:  oriented to person, place, and time. appears well-developed and well-nourished. No distress.  HENT: [place]/AT, PERRLA, no scleral icterus Mouth/Throat: Oropharynx is clear and moist. No oropharyngeal exudate.  Cardiovascular: Normal rate, regular rhythm and normal heart sounds. Exam reveals no gallop and no friction rub.  No murmur heard.  Pulmonary/Chest: Effort normal and breath sounds normal. No respiratory distress.  has no wheezes.  Neck =  supple, no nuchal rigidity Ext: no right hip pain with rom Lymphadenopathy: no cervical adenopathy. No axillary adenopathy Neurological: alert and oriented to person, place, and time.  Skin: Skin is warm and dry. No rash noted. No erythema.  Psychiatric: a normal mood and affect.  behavior is normal.    Lab Results Recent Labs    06/01/22 1355 06/03/22 0209 06/04/22 0505  WBC 6.1 6.0 4.9  HGB 7.9* 7.5* 6.8*  HCT 25.7* 24.0* 22.2*  NA 139 141  --   K 3.9 4.0  --   CL 108 108  --   CO2 25 26  --   BUN 16 13  --   CREATININE 0.89 0.80  --    Liver Panel Recent Labs    06/01/22 1355  PROT 5.7*  ALBUMIN 3.6  AST 20  ALT 11  ALKPHOS 34*  BILITOT 0.9   Sedimentation Rate Recent Labs    06/01/22 1355  ESRSEDRATE 14   C-Reactive Protein Recent Labs    06/01/22 1355 06/03/22 1022  CRP 1.4* 14.9*    Microbiology: 7/31 synovial fluid = cultures are still pending Studies/Results: DG FLUORO GUIDED NEEDLE PLC ASPIRATION/INJECTION LOC  Result Date: 06/02/2022 CLINICAL DATA:  Right hip pain. EXAM: RIGHT HIP INJECTION UNDER FLUOROSCOPY COMPARISON:  Right hip radiographs and MRI 06/01/2022 FLUOROSCOPY: Radiation Exposure Index (as provided by the fluoroscopic device): 3.10  mGy Kerma PROCEDURE: Telephone consent was obtained from the patient's daughter. Risks and benefits of the procedure were discussed. We were initially asked to perform a right hip aspiration, however MRI demonstrated only a very small joint effusion. A request was later made for a therapeutic intra-articular steroid injection. A time-out was performed prior to the start of the procedure. Overlying skin prepped with Betadine, draped in the usual sterile fashion, and infiltrated locally with 1% lidocaine. A 22 gauge spinal needle advanced to the lateral aspect of the right femoral head-neck junction. When the stylet was removed, there was spontaneous return of blood-tinged synovial fluid. Approximately 2 mL of this  fluid was collected and sent to the lab. Diagnostic injection of iodinated contrast demonstrated intra-articular spread without intravascular component. 80 mg Depo-Medrol and 5 ml Sensorcaine 0.5% were then administered. No immediate complication. IMPRESSION: Technically successful right hip injection under fluoroscopy. Read by: [name], NP Electronically Signed   By: Logan Bores M.D.   On: 06/02/2022 15:18     Assessment/Plan:  Possible septic arthritis of right hip = continue on vancomycin and ceftriaxone for the time being. If cultures remain negative, then consider to stop abtx and not commit to treatment. She had abn aspirate but borderline for septic arthritis vs. Pseudogout arthritis. For the time being will continue to follow up on cultures.   Fevers = no longer having fevers but is receiving tylenol. Consider stopping antipyretics in case it is masking fevers  Anemia = getting rbc transfusion today.  [facility] for Infectious Diseases Pager: [phone]  06/04/2022, 1:08 PM

## 2022-06-04 NOTE — Progress Notes (Signed)
[place] for Warfarin  Indication:  history of bilateral DVT in 2015  Patient Measurements: Height: '5\' 2"'$  (157.5 cm) Weight: 53.1 kg (117 lb) IBW/kg (Calculated) : 50.1  Vital Signs: Temp: 98.2 F (36.8 C) (08/02 0751) Temp Source: Oral (08/02 0751) BP: 116/45 (08/02 0751) Pulse Rate: 72 (08/02 0751)  Labs: Recent Labs    06/01/22 1355 06/02/22 0256 06/03/22 0209 06/04/22 0505  HGB 7.9*  --  7.5* 6.8*  HCT 25.7*  --  24.0* 22.2*  PLT 791*  --  624* 596*  LABPROT  --  36.4* 27.2* 20.0*  INR  --  3.7* 2.6* 1.7*  CREATININE 0.89  --  0.80  --   CKTOTAL 24*  --   --   --      Estimated Creatinine Clearance: 47.3 mL/min (by C-G formula based on SCr of 0.8 mg/dL).  Assessment: 76 yo female presented on 06/01/2022. Patient is on warfarin prior to admission for history of bilateral DVT in 2015. Patient on warfarin '5mg'$  Monday and Friday and 2.'5mg'$  all other days per Care Everywhere note from 05/13/22. Patient's family reported last dose PTA 7/29. Patient did not get a dose on 7/30 or 7/31. INR trending down quickley 3.7>2.6>1.7.  Hemoglobin low at 6.8 today, transfusing PRBC x 1. No bleeding noted.  INR is SUBtherapeutic today at 1.7.   Goal of Therapy:  INR 2-3 Monitor platelets by anticoagulation protocol: Yes   Plan:  Warfarin '5mg'$  PO x 1 today Monitor daily INR, CBC, clinical course, s/sx of bleed, PO intake/diet, Drug-Drug Interactions    Thank you for allowing [place] to participate in this patients care. Jens Som, PharmD 06/04/2022 9:29 AM  **Pharmacist phone directory can be found on [email or web address] listed under [place]**

## 2022-06-04 NOTE — TOC Progression Note (Signed)
Transition of Care [facility]) - Progression Note    Patient Details  Name: [name] [name] MRN: [id number] Date of Birth: 1946-03-21  Transition of [facility]) CM/SW [place], LCSW Phone Number: 06/04/2022, 1:15 PM  Clinical Narrative:    CSW met with patient's daughter, [name], in room. [facility] able to accept patient at discharge. CSW will keep them posted on if patient requires IV antibiotics at DC. Family will transport patient to SNF via car.    Expected Discharge Plan: [place] Barriers to Discharge: Continued Medical Work up  Expected Discharge Plan and Services Expected Discharge Plan: [place] In-house Referral: Clinical Social Work   Post Acute Care Choice: [place] Living arrangements for the past 2 months: Single Family Home                                       Social Determinants of Health (SDOH) Interventions    Readmission Risk Interventions     No data to display

## 2022-06-04 NOTE — Progress Notes (Addendum)
PROGRESS NOTE        PATIENT DETAILS Name: [name] [name] Age: 76 y.o. Sex: female Date of Birth: 09-27-1946 Admit Date: 06/01/2022 Admitting Physician [name] [name], MD ZOX:[id number], [name], MD  Brief Summary: Patient is a 76 y.o.  female with history of essential thrombocythemia (positive JAK2 mutation), CAD s/p PCI to LAD/LCx, DM-2, HTN, chronic cognitive dysfunction who presented with worsening intractable right hip pain.   Significant events: 7/30>> to ED with severe right hip pain-significantly confused after MRI.  Admit to [facility].  Unable to ambulate/bear weight due to pain. 7/31>> discussed with radiology-not enough effusion for diagnostic arthrocentesis.  Discussed with orthopedics [name], PA-C)-recommended intra-articular steroids.  IR reconsulted for intra-articular steroid injection-but during right hip injection during fluoroscopy-IR was able to get some synovial fluid out.  Significant studies: 7/30>> x-ray right hip/pelvis: Severe degenerative changes of the right hip-progressed from 2015. 7/30>> MRI right hip: No avascular necrosis-mild right hip joint effusion.  Mild to moderate superior right femoral acetabular osteoarthritis, right subacromial.  Mild right subacromial/subdeltoid bursitis. 7/30>> synovial fluid-cell count 23,600-no crystals.  Significant microbiology data: 7/30>> blood culture: Negative. 7/30>> urine culture: E. coli/Staph epidermidis (likely contamination) 7/31>> synovial fluid culture: No growth 8/01>> blood culture: Negative.  Procedures: 7/31>> fluoroscopic guided arthrocentesis and intra-articular steroid injection to right hip.  Consults: Phone consult-orthopedics Interventional radiology ID  Subjective: Feels better-less pain in her right hip.  Daughter at bedside.  Objective: Vitals: Blood pressure (!) 104/44, pulse 71, temperature 97.9 F (36.6 C), temperature source Oral, resp. rate 18, height  '5\' 2"'$  (1.575 m), weight 53.1 kg, SpO2 97 %.   Exam: Gen Exam:Alert awake-not in any distress HEENT:atraumatic, normocephalic Chest: B/L clear to auscultation anteriorly CVS:S1S2 regular Abdomen:soft non tender, non distended Extremities:no edema Neurology: Non focal Skin: no rash   Pertinent Labs/Radiology:    Latest Ref Rng & Units 06/04/2022    5:05 AM 06/03/2022    2:09 AM 06/01/2022    1:55 PM  CBC  WBC 4.0 - 10.5 K/uL 4.9  6.0  6.1   Hemoglobin 12.0 - 15.0 g/dL 6.8  7.5  7.9   Hematocrit 36.0 - 46.0 % 22.2  24.0  25.7   Platelets 150 - 400 K/uL 596  624  791     Lab Results  Component Value Date   NA 141 06/03/2022   K 4.0 06/03/2022   CL 108 06/03/2022   CO2 26 06/03/2022      Assessment/Plan: Fever-severe right hip pain: No further fever since starting IV antibiotics on 8/1.  Overall better-hardly any right hip pain today.  Overall picture suspicious for septic arthritis-however synovial fluid cell count was borderline elevated-and cultures negative so far.  Continue to follow cultures-continue to mobilize.  Appreciate infectious disease input.    Acute metabolic/toxic encephalopathy: Occurred after she was given sedatives for MRI on admission.  This has resolved-she is awake and alert and at baseline.  Given history of cognitive issues-expect some amount of delirium during hospitalization.  Maintain delirium precautions.    Seizure disorder:  Continue Keppra.  History of essential thrombocythemia: Continue anagrelide/Hydrea-follow CBC-resume outpatient follow-up with Dr. [name] postdischarge.  Macrocytic anemia: Worsening hemoglobin-likely due to acute illness.  Transfusing 1 unit of PRBC.  Repeat CBC in AM.  Fluctuating leukopenia: Due to thrombocythemia/bone marrow involvement.  History of VTE: INR slightly subtherapeutic-pharmacy following-continue  Coumadin.  If drops further-we can contemplate bridging Lovenox.  But with severity of anemia-we will allow INR to  gradually rise.  History of CAD: No anginal symptoms-follows with Dr. [name] statin/nitrates.  DM-2 (A1c 5.6 on 7/31): CBG stable with SSI.  Resume metformin on discharge.  Recent Labs    06/03/22 2124 06/04/22 0750 06/04/22 1225  GLUCAP 188* 110* 155*     GERD: Continue PPI  BMI: Estimated body mass index is 21.4 kg/m as calculated from the following:   Height as of this encounter: '5\' 2"'$  (1.575 m).   Weight as of this encounter: 53.1 kg.   Code status:   Code Status: Partial Code   DVT Prophylaxis: Coumadin with therapeutic INR.  Family Communication:Daughter at bedside   Disposition Plan: Status is: Observation The patient will require care spanning > 2 midnights and should be moved to inpatient because: Fever-improving right hip pain-concern for septic arthritis-awaiting final culture reports-we will need to be afebrile x48 hours before consideration of discharge.   Planned Discharge Destination:SNF  Diet: Diet Order             Diet heart healthy/carb modified Room service appropriate? Yes; Fluid consistency: Thin  Diet effective now                     Antimicrobial agents: Anti-infectives (From admission, onward)    Start     Dose/Rate Route Frequency Ordered Stop   06/04/22 1500  vancomycin (VANCOREADY) IVPB 750 mg/150 mL        750 mg 150 mL/hr over 60 Minutes Intravenous Every 24 hours 06/03/22 1333     06/03/22 1500  cefTRIAXone (ROCEPHIN) 2 g in sodium chloride 0.9 % 100 mL IVPB        2 g 200 mL/hr over 30 Minutes Intravenous Every 24 hours 06/03/22 1327     06/03/22 1500  vancomycin (VANCOCIN) IVPB 1000 mg/200 mL premix        1,000 mg 200 mL/hr over 60 Minutes Intravenous  Once 06/03/22 1329 06/03/22 1815        MEDICATIONS: Scheduled Meds:  acetaminophen  1,000 mg Oral Q8H   anagrelide  0.5 mg Oral Daily   diclofenac Sodium  2 g Topical QID   diphenhydrAMINE  25 mg Intravenous Once   hydroxyurea  500 mg Oral Daily    insulin aspart  0-15 Units Subcutaneous TID WC   insulin aspart  0-5 Units Subcutaneous QHS   levETIRAcetam  250 mg Oral BID   pantoprazole  40 mg Oral Daily   rosuvastatin  10 mg Oral QPM   Warfarin - Pharmacist Dosing Inpatient   Does not apply q1600   Continuous Infusions:  cefTRIAXone (ROCEPHIN)  IV 2 g (06/03/22 1547)   vancomycin      PRN Meds:.gadobutrol, morphine injection, ondansetron **OR** ondansetron (ZOFRAN) IV, oxyCODONE   I have personally reviewed following labs and imaging studies  LABORATORY DATA: CBC: Recent Labs  Lab 06/01/22 1355 06/03/22 0209 06/04/22 0505  WBC 6.1 6.0 4.9  NEUTROABS 5.3  --   --   HGB 7.9* 7.5* 6.8*  HCT 25.7* 24.0* 22.2*  MCV 110.8* 110.6* 109.9*  PLT 791* 624* 596*     Basic Metabolic Panel: Recent Labs  Lab 06/01/22 1355 06/03/22 0209  NA 139 141  K 3.9 4.0  CL 108 108  CO2 25 26  GLUCOSE 117* 158*  BUN 16 13  CREATININE 0.89 0.80  CALCIUM 8.8* 8.6*  MG  --  1.9     GFR: Estimated Creatinine Clearance: 47.3 mL/min (by C-G formula based on SCr of 0.8 mg/dL).  Liver Function Tests: Recent Labs  Lab 06/01/22 1355  AST 20  ALT 11  ALKPHOS 34*  BILITOT 0.9  PROT 5.7*  ALBUMIN 3.6    No results for input(s): "LIPASE", "AMYLASE" in the last 168 hours. No results for input(s): "AMMONIA" in the last 168 hours.  Coagulation Profile: Recent Labs  Lab 06/02/22 0256 06/03/22 0209 06/04/22 0505  INR 3.7* 2.6* 1.7*     Cardiac Enzymes: Recent Labs  Lab 06/01/22 1355  CKTOTAL 24*     BNP (last 3 results) No results for input(s): "PROBNP" in the last 8760 hours.  Lipid Profile: No results for input(s): "CHOL", "HDL", "LDLCALC", "TRIG", "CHOLHDL", "LDLDIRECT" in the last 72 hours.  Thyroid Function Tests: No results for input(s): "TSH", "T4TOTAL", "FREET4", "T3FREE", "THYROIDAB" in the last 72 hours.  Anemia Panel: No results for input(s): "VITAMINB12", "FOLATE", "FERRITIN", "TIBC", "IRON",  "RETICCTPCT" in the last 72 hours.  Urine analysis:    Component Value Date/Time   COLORURINE YELLOW 06/01/2022 2346   APPEARANCEUR CLEAR 06/01/2022 2346   LABSPEC 1.016 06/01/2022 2346   PHURINE 6.0 06/01/2022 2346   GLUCOSEU NEGATIVE 06/01/2022 2346   HGBUR NEGATIVE 06/01/2022 2346   BILIRUBINUR NEGATIVE 06/01/2022 2346   KETONESUR 5 (A) 06/01/2022 2346   PROTEINUR NEGATIVE 06/01/2022 2346   UROBILINOGEN 1.0 06/28/2012 1310   NITRITE NEGATIVE 06/01/2022 2346   LEUKOCYTESUR SMALL (A) 06/01/2022 2346    Sepsis Labs: Lactic Acid, Venous    Component Value Date/Time   LATICACIDVEN 1.0 06/01/2022 1755    MICROBIOLOGY: Recent Results (from the past 240 hour(s))  Blood culture (routine x 2)     Status: None (Preliminary result)   Collection Time: 06/01/22  3:40 PM   Specimen: BLOOD RIGHT FOREARM  Result Value Ref Range Status   Specimen Description BLOOD RIGHT FOREARM  Final   Special Requests   Final    BOTTLES DRAWN AEROBIC AND ANAEROBIC Blood Culture results may not be optimal due to an inadequate volume of blood received in culture bottles   Culture   Final    NO GROWTH 3 DAYS Performed at [facility], [place] [address] [place], [place] [address]    Report Status PENDING  Incomplete  Blood culture (routine x 2)     Status: None (Preliminary result)   Collection Time: 06/01/22  5:55 PM   Specimen: BLOOD  Result Value Ref Range Status   Specimen Description BLOOD LEFT ANTECUBITAL  Final   Special Requests   Final    BOTTLES DRAWN AEROBIC AND ANAEROBIC Blood Culture adequate volume   Culture   Final    NO GROWTH 3 DAYS Performed at [facility], [place] [address] [place], [place] [address]    Report Status PENDING  Incomplete  Urine Culture     Status: Abnormal   Collection Time: 06/01/22 11:03 PM   Specimen: Urine, Clean Catch  Result Value Ref Range Status   Specimen Description URINE, CLEAN CATCH  Final   Special Requests   Final    NONE Performed at  [facility], [place] [address], [place], [place] [address]    Culture (A)  Final    40,000 COLONIES/mL STAPHYLOCOCCUS EPIDERMIDIS 40,000 COLONIES/mL ESCHERICHIA COLI    Report Status 06/03/2022 FINAL  Final   Organism ID, Bacteria STAPHYLOCOCCUS EPIDERMIDIS (A)  Final   Organism ID, Bacteria  ESCHERICHIA COLI (A)  Final      Susceptibility   Escherichia coli - MIC*    AMPICILLIN 8 SENSITIVE Sensitive     CEFAZOLIN <=4 SENSITIVE Sensitive     CEFEPIME <=0.12 SENSITIVE Sensitive     CEFTRIAXONE <=0.25 SENSITIVE Sensitive     CIPROFLOXACIN <=0.25 SENSITIVE Sensitive     GENTAMICIN <=1 SENSITIVE Sensitive     IMIPENEM <=0.25 SENSITIVE Sensitive     NITROFURANTOIN <=16 SENSITIVE Sensitive     TRIMETH/SULFA <=20 SENSITIVE Sensitive     AMPICILLIN/SULBACTAM 4 SENSITIVE Sensitive     PIP/TAZO <=4 SENSITIVE Sensitive     * 40,000 COLONIES/mL ESCHERICHIA COLI   Staphylococcus epidermidis - MIC*    CIPROFLOXACIN <=0.5 SENSITIVE Sensitive     GENTAMICIN <=0.5 SENSITIVE Sensitive     NITROFURANTOIN <=16 SENSITIVE Sensitive     OXACILLIN >=4 RESISTANT Resistant     TETRACYCLINE <=1 SENSITIVE Sensitive     VANCOMYCIN 1 SENSITIVE Sensitive     TRIMETH/SULFA <=10 SENSITIVE Sensitive     CLINDAMYCIN <=0.25 SENSITIVE Sensitive     RIFAMPIN <=0.5 SENSITIVE Sensitive     Inducible Clindamycin NEGATIVE Sensitive     * 40,000 COLONIES/mL STAPHYLOCOCCUS EPIDERMIDIS  Anaerobic culture w Gram Stain     Status: None (Preliminary result)   Collection Time: 06/02/22  2:37 PM   Specimen: PATH Cytology Misc. fluid; Synovial Fluid  Result Value Ref Range Status   Specimen Description SYNOVIAL  Final   Special Requests RIGHT HIP  Final   Gram Stain   Final    ABUNDANT WBC PRESENT,BOTH PMN AND MONONUCLEAR NO ORGANISMS SEEN Performed at [facility], [address], [place], [place] [address]    Culture   Final    NO ANAEROBES ISOLATED; CULTURE IN PROGRESS FOR 5 DAYS   Report Status PENDING   Incomplete  Body fluid culture w Gram Stain     Status: None (Preliminary result)   Collection Time: 06/02/22  2:37 PM   Specimen: PATH Cytology Misc. fluid; Synovial Fluid  Result Value Ref Range Status   Specimen Description SYNOVIAL  Final   Special Requests RIGHT HIP  Final   Gram Stain   Final    FEW WBC PRESENT, PREDOMINANTLY MONONUCLEAR NO ORGANISMS SEEN    Culture   Final    NO GROWTH 2 DAYS Performed at [facility], [address], [place], [place] [address]    Report Status PENDING  Incomplete  Culture, blood (Routine X 2) w Reflex to ID Panel     Status: None (Preliminary result)   Collection Time: 06/03/22 10:21 AM   Specimen: BLOOD  Result Value Ref Range Status   Specimen Description BLOOD RIGHT ANTECUBITAL  Final   Special Requests   Final    BOTTLES DRAWN AEROBIC AND ANAEROBIC Blood Culture adequate volume   Culture   Final    NO GROWTH < 24 HOURS Performed at [facility], [place] [address] [place], [place] [address]    Report Status PENDING  Incomplete  Culture, blood (Routine X 2) w Reflex to ID Panel     Status: None (Preliminary result)   Collection Time: 06/03/22 10:23 AM   Specimen: BLOOD  Result Value Ref Range Status   Specimen Description BLOOD LEFT ANTECUBITAL  Final   Special Requests   Final    BOTTLES DRAWN AEROBIC AND ANAEROBIC Blood Culture adequate volume   Culture   Final    NO GROWTH < 24  HOURS Performed at [facility], [place] [address], [place],  [address]    Report Status PENDING  Incomplete    RADIOLOGY STUDIES/RESULTS: DG FLUORO GUIDED NEEDLE PLC ASPIRATION/INJECTION LOC  Result Date: 06/02/2022 CLINICAL DATA:  Right hip pain. EXAM: RIGHT HIP INJECTION UNDER FLUOROSCOPY COMPARISON:  Right hip radiographs and MRI 06/01/2022 FLUOROSCOPY: Radiation Exposure Index (as provided by the fluoroscopic device): 3.10 mGy Kerma PROCEDURE: Telephone consent was obtained from the patient's daughter. Risks and benefits of the  procedure were discussed. We were initially asked to perform a right hip aspiration, however MRI demonstrated only a very small joint effusion. A request was later made for a therapeutic intra-articular steroid injection. A time-out was performed prior to the start of the procedure. Overlying skin prepped with Betadine, draped in the usual sterile fashion, and infiltrated locally with 1% lidocaine. A 22 gauge spinal needle advanced to the lateral aspect of the right femoral head-neck junction. When the stylet was removed, there was spontaneous return of blood-tinged synovial fluid. Approximately 2 mL of this fluid was collected and sent to the lab. Diagnostic injection of iodinated contrast demonstrated intra-articular spread without intravascular component. 80 mg Depo-Medrol and 5 ml Sensorcaine 0.5% were then administered. No immediate complication. IMPRESSION: Technically successful right hip injection under fluoroscopy. Read by: [name], NP Electronically Signed   By: Logan Bores M.D.   On: 06/02/2022 15:18     LOS: 2 days   Oren Binet, MD  [facility]    To contact the attending provider between 7A-7P or the covering provider during after hours 7P-7A, please log into the web site [email or web address] and access using universal [place] password for that web site. If you do not have the password, please call the hospital operator.  06/04/2022, 2:20 PM

## 2022-06-05 ENCOUNTER — Inpatient Hospital Stay: Payer: Self-pay

## 2022-06-05 DIAGNOSIS — Z7901 Long term (current) use of anticoagulants: Secondary | ICD-10-CM | POA: Diagnosis not present

## 2022-06-05 DIAGNOSIS — R262 Difficulty in walking, not elsewhere classified: Secondary | ICD-10-CM | POA: Diagnosis not present

## 2022-06-05 DIAGNOSIS — M25551 Pain in right hip: Secondary | ICD-10-CM | POA: Diagnosis not present

## 2022-06-05 DIAGNOSIS — I2510 Atherosclerotic heart disease of native coronary artery without angina pectoris: Secondary | ICD-10-CM | POA: Diagnosis not present

## 2022-06-05 LAB — BPAM RBC
Blood Product Expiration Date: 202308292359
ISSUE DATE / TIME: 202308021125
Unit Type and Rh: 6200

## 2022-06-05 LAB — TYPE AND SCREEN
ABO/RH(D): A POS
Antibody Screen: NEGATIVE
Unit division: 0

## 2022-06-05 LAB — CBC
HCT: 27 % — ABNORMAL LOW (ref 36.0–46.0)
Hemoglobin: 8.8 g/dL — ABNORMAL LOW (ref 12.0–15.0)
MCH: 34.4 pg — ABNORMAL HIGH (ref 26.0–34.0)
MCHC: 32.6 g/dL (ref 30.0–36.0)
MCV: 105.5 fL — ABNORMAL HIGH (ref 80.0–100.0)
Platelets: 700 10*3/uL — ABNORMAL HIGH (ref 150–400)
RBC: 2.56 MIL/uL — ABNORMAL LOW (ref 3.87–5.11)
RDW: 21.1 % — ABNORMAL HIGH (ref 11.5–15.5)
WBC: 5.9 10*3/uL (ref 4.0–10.5)
nRBC: 0.3 % — ABNORMAL HIGH (ref 0.0–0.2)

## 2022-06-05 LAB — GLUCOSE, CAPILLARY
Glucose-Capillary: 132 mg/dL — ABNORMAL HIGH (ref 70–99)
Glucose-Capillary: 289 mg/dL — ABNORMAL HIGH (ref 70–99)
Glucose-Capillary: 72 mg/dL (ref 70–99)
Glucose-Capillary: 151 mg/dL — ABNORMAL HIGH (ref 70–99)

## 2022-06-05 LAB — PROTIME-INR
INR: 2 — ABNORMAL HIGH (ref 0.8–1.2)
Prothrombin Time: 22.6 seconds — ABNORMAL HIGH (ref 11.4–15.2)

## 2022-06-05 MED ORDER — SODIUM CHLORIDE 0.9% FLUSH: 10.0000 mL | INTRAVENOUS | Status: DC | PRN

## 2022-06-05 MED ORDER — CHLORHEXIDINE GLUCONATE CLOTH 2 % EX PADS
6.0000 | MEDICATED_PAD | Freq: Every day | CUTANEOUS | Status: DC
Start: 2022-06-05 — End: 2022-06-06
  Administered 2022-06-05 – 2022-06-06 (×2): 6 via TOPICAL

## 2022-06-05 MED ORDER — SODIUM CHLORIDE 0.9% FLUSH
10.0000 mL | Freq: Two times a day (BID) | INTRAVENOUS | Status: DC
  Administered 2022-06-05 – 2022-06-06 (×3): 10 mL

## 2022-06-05 MED ORDER — WARFARIN SODIUM 2.5 MG PO TABS
2.5000 mg | ORAL_TABLET | Freq: Once | ORAL | Status: AC
  Administered 2022-06-05: 2.5 mg via ORAL
  Filled 2022-06-05: qty 1

## 2022-06-05 NOTE — Plan of Care (Signed)
  Problem: Health Behavior/Discharge Planning: Goal: Ability to manage health-related needs will improve Outcome: Progressing   

## 2022-06-05 NOTE — Care Management Important Message (Signed)
Important Message  Patient Details  Name: [name] [name] MRN: [id number] Date of Birth: 1946/07/14   Medicare Important Message Given:  Yes     [name] 06/05/2022, 2:29 PM

## 2022-06-05 NOTE — Progress Notes (Signed)
ANTICOAGULATION CONSULT NOTE- follow-up Pharmacy Consult for Warfarin  Indication:  history of bilateral DVT in 2015  Patient Measurements: Height: '5\' 2"'$  (157.5 cm) Weight: 53.1 kg (117 lb) IBW/kg (Calculated) : 50.1  Vital Signs: Temp: 97.8 F (36.6 C) (08/03 0619) Temp Source: Oral (08/03 0619) BP: 140/57 (08/03 0619) Pulse Rate: 61 (08/03 0619)  Labs: Recent Labs    06/03/22 0209 06/04/22 0505 06/05/22 0505  HGB 7.5* 6.8*  --   HCT 24.0* 22.2*  --   PLT 624* 596*  --   LABPROT 27.2* 20.0* 22.6*  INR 2.6* 1.7* 2.0*  CREATININE 0.80  --   --      Estimated Creatinine Clearance: 47.3 mL/min (by C-G formula based on SCr of 0.8 mg/dL).  Assessment: 76 yo female presented on 06/01/2022. Patient is on warfarin prior to admission for history of bilateral DVT in 2015. Patient on warfarin '5mg'$  Monday and Friday and 2.'5mg'$  all other days per Care Everywhere note from 05/13/22. Patient's family reported last dose PTA 7/29. Patient did not get a dose on 7/30 or 7/31. INR trending down quickley 3.7>2.6>1.7.    06/24/2022 Hemoglobin low at 6.8 today, transfusing PRBC x 1. No bleeding noted  8/3 AM update: INR is therapeutic today at 2.0   Goal of Therapy:  INR 2-3 Monitor platelets by anticoagulation protocol: Yes   Plan:  Warfarin 2.'5mg'$  PO x 1 today (home dose) Monitor daily INR, CBC, clinical course, s/sx of bleed, PO intake/diet, Drug-Drug Interactions  Chandani Rogowski BS, PharmD, BCPS Clinical Pharmacist 06/05/2022 7:26 AM  Contact: [phone] after 3 PM  "Be curious, not judgmental..." -Jamal Maes

## 2022-06-05 NOTE — Progress Notes (Signed)
Peripherally Inserted Central Catheter Placement  The IV Nurse has discussed with the patient and/or persons authorized to consent for the patient, the purpose of this procedure and the potential benefits and risks involved with this procedure.  The benefits include less needle sticks, lab draws from the catheter, and the patient may be discharged home with the catheter. Risks include, but not limited to, infection, bleeding, blood clot (thrombus formation), and puncture of an artery; nerve damage and irregular heartbeat and possibility to perform a PICC exchange if needed/ordered by physician.  Alternatives to this procedure were also discussed.  Bard Power PICC patient education guide, fact sheet on infection prevention and patient information card has been provided to patient /or left at bedside.    PICC Placement Documentation  PICC Single Lumen 77/93/90 Right Basilic 30 cm 0 cm (Active)  Indication for Insertion or Continuance of Line Home intravenous therapies (PICC only) 06/05/22 1400  Exposed Catheter (cm) 0 cm 06/05/22 1400  Site Assessment Clean, Dry, Intact 06/05/22 1400  Line Status Flushed;Saline locked;Blood return noted 06/05/22 1400  Dressing Type Transparent;Securing device 06/05/22 1400  Dressing Status Antimicrobial disc in place;Clean, Dry, Intact 06/05/22 1400  Safety Lock Not Applicable 30/09/23 3007  Line Care Connections checked and tightened 06/05/22 1400  Dressing Intervention New dressing 06/05/22 1400  Dressing Change Due 06/12/22 06/05/22 1400       Holley Bouche Renee 06/05/2022, 2:34 PM

## 2022-06-05 NOTE — Progress Notes (Addendum)
[facility] for Infectious Disease    Date of Admission:  06/01/2022   Total days of antibiotics 3   ID: [name] [name] is a 76 y.o. female with  culture negative septic arthritis of right hip Principal Problem:   Ambulatory dysfunction Active Problems:   CAD (coronary artery disease)   DM2 (diabetes mellitus, type 2) (HCC)   Chronic anemia   Essential thrombocythemia (HCC)   Right hip pain   Delirium due to multiple etiologies, acute, hyperactive   Right hip joint effusion   Memory loss   Chronic anticoagulation   History of seizure   Hypoxia   Pressure injury of skin    Subjective: Remains afebrile, decrease pain to right hip  Medications:   acetaminophen  1,000 mg Oral Q8H   anagrelide  0.5 mg Oral Daily   Chlorhexidine Gluconate Cloth  6 each Topical Daily   diclofenac Sodium  2 g Topical QID   diphenhydrAMINE  25 mg Intravenous Once   hydroxyurea  500 mg Oral Daily   insulin aspart  0-15 Units Subcutaneous TID WC   insulin aspart  0-5 Units Subcutaneous QHS   levETIRAcetam  250 mg Oral BID   pantoprazole  40 mg Oral Daily   rosuvastatin  10 mg Oral QPM   sodium chloride flush  10-40 mL Intracatheter Q12H   warfarin  2.5 mg Oral ONCE-1600   Warfarin - Pharmacist Dosing Inpatient   Does not apply q1600    Objective: Vital signs in last 24 hours: Temp:  [97.4 F (36.3 C)-98.9 F (37.2 C)] 98.2 F (36.8 C) (08/03 1136) Pulse Rate:  [61-73] 73 (08/03 1136) Resp:  [14-16] 16 (08/03 1136) BP: (109-146)/(50-57) 122/54 (08/03 1136) SpO2:  [95 %-98 %] 98 % (08/03 1136) Physical Exam  Constitutional:  oriented to person, place, and time. appears well-developed and well-nourished. No distress.  HENT: [place]/AT, PERRLA, no scleral icterus Mouth/Throat: Oropharynx is clear and moist. No oropharyngeal exudate.  Cardiovascular: Normal rate, regular rhythm and normal heart sounds. Exam reveals no gallop and no friction rub.  No murmur heard.  Pulmonary/Chest:  Effort normal and breath sounds normal. No respiratory distress.  has no wheezes.  Neck = supple, no nuchal rigidity Abdominal: Soft. Bowel sounds are normal.  exhibits no distension. There is no tenderness.  Lymphadenopathy: no cervical adenopathy. No axillary adenopathy Neurological: alert and oriented to person, place, and time.  Skin: Skin is warm and dry. No rash noted. No erythema.  Psychiatric: a normal mood and affect.  behavior is normal.    Lab Results Recent Labs    06/03/22 0209 06/04/22 0505 06/05/22 0505  WBC 6.0 4.9 5.9  HGB 7.5* 6.8* 8.8*  HCT 24.0* 22.2* 27.0*  NA 141  --   --   K 4.0  --   --   CL 108  --   --   CO2 26  --   --   BUN 13  --   --   CREATININE 0.80  --   --    Lab Results  Component Value Date   ESRSEDRATE 14 06/01/2022    C-Reactive Protein Recent Labs    06/03/22 1022  CRP 14.9*    Microbiology: reviewed Studies/Results: [place] EKG SITE RITE  Result Date: 06/05/2022 If Site Rite image not attached, placement could not be confirmed due to current cardiac rhythm.    Assessment/Plan: 76yo F with history of new onset fever, right hip pain with arthrocentesis with high %  of neutrophils, hemathrosis 24K. - fevers and pain improved since initiation of abtx and supportive care - will plan to treat with 4 wk of IV vancomycin and ceftriaxone 2gm IV daily for presumed native right hip septic arthritis. Will check if daptomycin is covered (Which would be the preferred agent since less nephrotoxicity) - discussed plan with dr ghimire, planning to get picc line today. - will place opat orders in. -I will see her back in the ID clinic in 2 and 4 wk  [facility] Pager: [phone]  06/05/2022, 2:44 PM

## 2022-06-05 NOTE — TOC Progression Note (Signed)
Transition of Care [facility]) - Progression Note    Patient Details  Name: [name] [name] MRN: [id number] Date of Birth: 11-13-1945  Transition of Care [facility]) CM/SW [place], LCSW Phone Number: 06/05/2022, 3:48 PM  Clinical Narrative:    CSW awaiting return call from [facility] regarding if they can accept patient on IV Dapto.   Expected Discharge Plan: [place] Barriers to Discharge: Continued Medical Work up  Expected Discharge Plan and Services Expected Discharge Plan: [place] In-house Referral: Clinical Social Work   Post Acute Care Choice: [place] Living arrangements for the past 2 months: Single Family Home                                       Social Determinants of Health (SDOH) Interventions    Readmission Risk Interventions     No data to display

## 2022-06-05 NOTE — Progress Notes (Signed)
PROGRESS NOTE        PATIENT DETAILS Name: [name] [name] Age: 76 y.o. Sex: female Date of Birth: [birth year] Admit Date: 06/01/2022 Admitting Physician [name] [name], MD [id number], [name], MD  Brief Summary: Patient is a 76 y.o.  female with history of essential thrombocythemia (positive JAK2 mutation), CAD s/p PCI to LAD/LCx, DM-2, HTN, chronic cognitive dysfunction who presented with worsening intractable right hip pain.   Significant events: 7/30>> to ED with severe right hip pain-significantly confused after MRI.  Admit to [facility].  Unable to ambulate/bear weight due to pain. 7/31>> discussed with radiology-not enough effusion for diagnostic arthrocentesis.  Discussed with orthopedics [name], PA-C)-recommended intra-articular steroids.  IR reconsulted for intra-articular steroid injection-but during right hip injection during fluoroscopy-IR was able to get some synovial fluid out.  Significant studies: 7/30>> x-ray right hip/pelvis: Severe degenerative changes of the right hip-progressed from 2015. 7/30>> MRI right hip: No avascular necrosis-mild right hip joint effusion.  Mild to moderate superior right femoral acetabular osteoarthritis, right subacromial.  Mild right subacromial/subdeltoid bursitis. 7/30>> synovial fluid-cell count 23,600-no crystals.  Significant microbiology data: 7/30>> blood culture: Negative. 7/30>> urine culture: E. coli/Staph epidermidis (likely contamination) 7/31>> synovial fluid culture: No growth 8/01>> blood culture: Negative.  Procedures: 7/31>> fluoroscopic guided arthrocentesis and intra-articular steroid injection to right hip.  Consults: Phone consult-orthopedics Interventional radiology ID  Subjective: No pain in right hip   Objective: Vitals: Blood pressure (!) 122/54, pulse 73, temperature 98.2 F (36.8 C), temperature source Oral, resp. rate 16, height '5\' 2"'$  (1.575 m), weight 53.1 kg, SpO2 98  %.   Exam: Gen Exam:Alert awake-not in any distress HEENT:atraumatic, normocephalic Chest: B/L clear to auscultation anteriorly CVS:S1S2 regular Abdomen:soft non tender, non distended Extremities:no edema Neurology: Non focal Skin: no rash   Pertinent Labs/Radiology:    Latest Ref Rng & Units 06/05/2022    5:05 AM 06/04/2022    5:05 AM 06/03/2022    2:09 AM  CBC  WBC 4.0 - 10.5 K/uL 5.9  4.9  6.0   Hemoglobin 12.0 - 15.0 g/dL 8.8  6.8  C 7.5   Hematocrit 36.0 - 46.0 % 27.0  22.2  24.0   Platelets 150 - 400 K/uL 700  596  624     C Corrected result     Lab Results  Component Value Date   NA 141 06/03/2022   K 4.0 06/03/2022   CL 108 06/03/2022   CO2 26 06/03/2022      Assessment/Plan: Fever-severe right hip pain: No further fever since starting IV antibiotics on 8/1.  Overall much better-right hip pain has essentially resolved.  Overall picture suspicious for septic arthritis-however synovitic fluid cultures negative so far.    Acute metabolic/toxic encephalopathy: Occurred after she was given sedatives for MRI on admission.  This has resolved-she is awake and alert and at baseline.  Given history of cognitive issues-expect some amount of delirium during hospitalization.  Maintain delirium precautions.    Seizure disorder:  Continue Keppra.  History of essential thrombocythemia: Continue anagrelide/Hydrea-follow CBC-resume outpatient follow-up with Dr. [name] postdischarge.  Macrocytic anemia: Hemoglobin stable after 1 unit of PRBC transfusion in 8/2.  Follow CBC periodically.    Fluctuating leukopenia: Due to thrombocythemia/bone marrow involvement.  History of VTE: INR therapeutic-continue Coumadin per pharmacy.   History of CAD: No anginal symptoms-follows with Dr. [name] statin/nitrates.  DM-2 (A1c 5.6 on 7/31): CBG stable with SSI.  Resume metformin on discharge.  Recent Labs    06/04/22 2135 06/05/22 0807 06/05/22 1139  GLUCAP 124* 132* 289*      GERD: Continue PPI  BMI: Estimated body mass index is 21.4 kg/m as calculated from the following:   Height as of this encounter: '5\' 2"'$  (1.575 m).   Weight as of this encounter: 53.1 kg.   Code status:   Code Status: Partial Code   DVT Prophylaxis: Coumadin with therapeutic INR.  Family Communication: None at bedside.   Disposition Plan: Status is: Observation The patient will require care spanning > 2 midnights and should be moved to inpatient because: Fever-improving right hip pain-concern for septic arthritis-awaiting final culture reports-we will need to be afebrile x48 hours before consideration of discharge.   Planned Discharge Destination:SNF likely on 8/4.  Diet: Diet Order             Diet heart healthy/carb modified Room service appropriate? Yes; Fluid consistency: Thin  Diet effective now                     Antimicrobial agents: Anti-infectives (From admission, onward)    Start     Dose/Rate Route Frequency Ordered Stop   06/04/22 1500  vancomycin (VANCOREADY) IVPB 750 mg/150 mL        750 mg 150 mL/hr over 60 Minutes Intravenous Every 24 hours 06/03/22 1333     06/03/22 1500  cefTRIAXone (ROCEPHIN) 2 g in sodium chloride 0.9 % 100 mL IVPB        2 g 200 mL/hr over 30 Minutes Intravenous Every 24 hours 06/03/22 1327     06/03/22 1500  vancomycin (VANCOCIN) IVPB 1000 mg/200 mL premix        1,000 mg 200 mL/hr over 60 Minutes Intravenous  Once 06/03/22 1329 06/03/22 1815        MEDICATIONS: Scheduled Meds:  acetaminophen  1,000 mg Oral Q8H   anagrelide  0.5 mg Oral Daily   diclofenac Sodium  2 g Topical QID   diphenhydrAMINE  25 mg Intravenous Once   hydroxyurea  500 mg Oral Daily   insulin aspart  0-15 Units Subcutaneous TID WC   insulin aspart  0-5 Units Subcutaneous QHS   levETIRAcetam  250 mg Oral BID   pantoprazole  40 mg Oral Daily   rosuvastatin  10 mg Oral QPM   warfarin  2.5 mg Oral ONCE-1600   Warfarin - Pharmacist Dosing  Inpatient   Does not apply q1600   Continuous Infusions:  cefTRIAXone (ROCEPHIN)  IV 2 g (06/04/22 1438)   vancomycin 750 mg (06/04/22 1720)    PRN Meds:.gadobutrol, morphine injection, ondansetron **OR** ondansetron (ZOFRAN) IV, oxyCODONE   I have personally reviewed following labs and imaging studies  LABORATORY DATA: CBC: Recent Labs  Lab 06/01/22 1355 06/03/22 0209 06/04/22 0505 06/05/22 0505  WBC 6.1 6.0 4.9 5.9  NEUTROABS 5.3  --   --   --   HGB 7.9* 7.5* 6.8* 8.8*  HCT 25.7* 24.0* 22.2* 27.0*  MCV 110.8* 110.6* 109.9* 105.5*  PLT 791* 624* 596* 700*     Basic Metabolic Panel: Recent Labs  Lab 06/01/22 1355 06/03/22 0209  NA 139 141  K 3.9 4.0  CL 108 108  CO2 25 26  GLUCOSE 117* 158*  BUN 16 13  CREATININE 0.89 0.80  CALCIUM 8.8* 8.6*  MG  --  1.9     GFR: Estimated  Creatinine Clearance: 47.3 mL/min (by C-G formula based on SCr of 0.8 mg/dL).  Liver Function Tests: Recent Labs  Lab 06/01/22 1355  AST 20  ALT 11  ALKPHOS 34*  BILITOT 0.9  PROT 5.7*  ALBUMIN 3.6    No results for input(s): "LIPASE", "AMYLASE" in the last 168 hours. No results for input(s): "AMMONIA" in the last 168 hours.  Coagulation Profile: Recent Labs  Lab 06/02/22 0256 06/03/22 0209 06/04/22 0505 06/05/22 0505  INR 3.7* 2.6* 1.7* 2.0*     Cardiac Enzymes: Recent Labs  Lab 06/01/22 1355  CKTOTAL 24*     BNP (last 3 results) No results for input(s): "PROBNP" in the last 8760 hours.  Lipid Profile: No results for input(s): "CHOL", "HDL", "LDLCALC", "TRIG", "CHOLHDL", "LDLDIRECT" in the last 72 hours.  Thyroid Function Tests: No results for input(s): "TSH", "T4TOTAL", "FREET4", "T3FREE", "THYROIDAB" in the last 72 hours.  Anemia Panel: No results for input(s): "VITAMINB12", "FOLATE", "FERRITIN", "TIBC", "IRON", "RETICCTPCT" in the last 72 hours.  Urine analysis:    Component Value Date/Time   COLORURINE YELLOW 06/01/2022 2346   APPEARANCEUR CLEAR  06/01/2022 2346   LABSPEC 1.016 06/01/2022 2346   PHURINE 6.0 06/01/2022 2346   GLUCOSEU NEGATIVE 06/01/2022 2346   HGBUR NEGATIVE 06/01/2022 2346   BILIRUBINUR NEGATIVE 06/01/2022 2346   KETONESUR 5 (A) 06/01/2022 2346   PROTEINUR NEGATIVE 06/01/2022 2346   UROBILINOGEN 1.0 06/28/2012 1310   NITRITE NEGATIVE 06/01/2022 2346   LEUKOCYTESUR SMALL (A) 06/01/2022 2346    Sepsis Labs: Lactic Acid, Venous    Component Value Date/Time   LATICACIDVEN 1.0 06/01/2022 1755    MICROBIOLOGY: Recent Results (from the past 240 hour(s))  Blood culture (routine x 2)     Status: None (Preliminary result)   Collection Time: 06/01/22  3:40 PM   Specimen: BLOOD RIGHT FOREARM  Result Value Ref Range Status   Specimen Description BLOOD RIGHT FOREARM  Final   Special Requests   Final    BOTTLES DRAWN AEROBIC AND ANAEROBIC Blood Culture results may not be optimal due to an inadequate volume of blood received in culture bottles   Culture   Final    NO GROWTH 3 DAYS Performed at [facility], [place] [address], [place], [place] [address]    Report Status PENDING  Incomplete  Blood culture (routine x 2)     Status: None (Preliminary result)   Collection Time: 06/01/22  5:55 PM   Specimen: BLOOD  Result Value Ref Range Status   Specimen Description BLOOD LEFT ANTECUBITAL  Final   Special Requests   Final    BOTTLES DRAWN AEROBIC AND ANAEROBIC Blood Culture adequate volume   Culture   Final    NO GROWTH 3 DAYS Performed at [facility], [place] [address] [place], [place] [address]    Report Status PENDING  Incomplete  Urine Culture     Status: Abnormal   Collection Time: 06/01/22 11:03 PM   Specimen: Urine, Clean Catch  Result Value Ref Range Status   Specimen Description URINE, CLEAN CATCH  Final   Special Requests   Final    NONE Performed at [facility], [place] [address] [place], [place] [address]    Culture (A)  Final    40,000 COLONIES/mL STAPHYLOCOCCUS  EPIDERMIDIS 40,000 COLONIES/mL ESCHERICHIA COLI    Report Status 06/03/2022 FINAL  Final   Organism ID, Bacteria STAPHYLOCOCCUS EPIDERMIDIS (A)  Final   Organism ID, Bacteria ESCHERICHIA COLI (A)  Final      Susceptibility   Escherichia coli - MIC*    AMPICILLIN 8 SENSITIVE Sensitive     CEFAZOLIN <=4 SENSITIVE Sensitive     CEFEPIME <=0.12 SENSITIVE Sensitive     CEFTRIAXONE <=0.25 SENSITIVE Sensitive     CIPROFLOXACIN <=0.25 SENSITIVE Sensitive     GENTAMICIN <=1 SENSITIVE Sensitive     IMIPENEM <=0.25 SENSITIVE Sensitive     NITROFURANTOIN <=16 SENSITIVE Sensitive     TRIMETH/SULFA <=20 SENSITIVE Sensitive     AMPICILLIN/SULBACTAM 4 SENSITIVE Sensitive     PIP/TAZO <=4 SENSITIVE Sensitive     * 40,000 COLONIES/mL ESCHERICHIA COLI   Staphylococcus epidermidis - MIC*    CIPROFLOXACIN <=0.5 SENSITIVE Sensitive     GENTAMICIN <=0.5 SENSITIVE Sensitive     NITROFURANTOIN <=16 SENSITIVE Sensitive     OXACILLIN >=4 RESISTANT Resistant     TETRACYCLINE <=1 SENSITIVE Sensitive     VANCOMYCIN 1 SENSITIVE Sensitive     TRIMETH/SULFA <=10 SENSITIVE Sensitive     CLINDAMYCIN <=0.25 SENSITIVE Sensitive     RIFAMPIN <=0.5 SENSITIVE Sensitive     Inducible Clindamycin NEGATIVE Sensitive     * 40,000 COLONIES/mL STAPHYLOCOCCUS EPIDERMIDIS  Anaerobic culture w Gram Stain     Status: None (Preliminary result)   Collection Time: 06/02/22  2:37 PM   Specimen: PATH Cytology Misc. fluid; Synovial Fluid  Result Value Ref Range Status   Specimen Description SYNOVIAL  Final   Special Requests RIGHT HIP  Final   Gram Stain   Final    ABUNDANT WBC PRESENT,BOTH PMN AND MONONUCLEAR NO ORGANISMS SEEN Performed at [facility], [address], [place], [place] [address]    Culture   Final    NO ANAEROBES ISOLATED; CULTURE IN PROGRESS FOR 5 DAYS   Report Status PENDING  Incomplete  Body fluid culture w Gram Stain     Status: None (Preliminary result)   Collection Time: 06/02/22  2:37 PM    Specimen: PATH Cytology Misc. fluid; Synovial Fluid  Result Value Ref Range Status   Specimen Description SYNOVIAL  Final   Special Requests RIGHT HIP  Final   Gram Stain   Final    FEW WBC PRESENT, PREDOMINANTLY MONONUCLEAR NO ORGANISMS SEEN    Culture   Final    NO GROWTH 3 DAYS Performed at [facility], [address], [place], [place] [address]    Report Status PENDING  Incomplete  Culture, blood (Routine X 2) w Reflex to ID Panel     Status: None (Preliminary result)   Collection Time: 06/03/22 10:21 AM   Specimen: BLOOD  Result Value Ref Range Status   Specimen Description BLOOD RIGHT ANTECUBITAL  Final   Special Requests   Final    BOTTLES DRAWN AEROBIC AND ANAEROBIC Blood Culture adequate volume   Culture   Final    NO GROWTH < 24 HOURS Performed at [facility], [place] [address], [place], [place] [address]    Report Status PENDING  Incomplete  Culture, blood (Routine X 2) w Reflex to ID Panel     Status: None (Preliminary result)   Collection Time: 06/03/22 10:23 AM   Specimen: BLOOD  Result Value Ref Range Status   Specimen Description BLOOD LEFT ANTECUBITAL  Final   Special Requests   Final    BOTTLES DRAWN AEROBIC AND ANAEROBIC Blood Culture adequate volume   Culture   Final    NO GROWTH < 24 HOURS Performed at [facility], [place] [address], [place], [place] [address]    Report Status PENDING  Incomplete    RADIOLOGY STUDIES/RESULTS: No results found.   LOS: 3 days   Oren Binet, MD  [facility]    To contact the attending provider between 7A-7P or the covering provider during after hours 7P-7A, please log into the web site [email or web address] and access using universal [place] password for that web site. If you do not have the password, please call the hospital operator.  06/05/2022, 12:06 PM

## 2022-06-06 DIAGNOSIS — Z86718 Personal history of other venous thrombosis and embolism: Secondary | ICD-10-CM | POA: Diagnosis not present

## 2022-06-06 DIAGNOSIS — D471 Chronic myeloproliferative disease: Secondary | ICD-10-CM | POA: Diagnosis not present

## 2022-06-06 DIAGNOSIS — M009 Pyogenic arthritis, unspecified: Secondary | ICD-10-CM | POA: Diagnosis not present

## 2022-06-06 DIAGNOSIS — M25551 Pain in right hip: Secondary | ICD-10-CM | POA: Diagnosis not present

## 2022-06-06 DIAGNOSIS — G40909 Epilepsy, unspecified, not intractable, without status epilepticus: Secondary | ICD-10-CM | POA: Diagnosis not present

## 2022-06-06 DIAGNOSIS — G4089 Other seizures: Secondary | ICD-10-CM | POA: Diagnosis not present

## 2022-06-06 DIAGNOSIS — I82409 Acute embolism and thrombosis of unspecified deep veins of unspecified lower extremity: Secondary | ICD-10-CM | POA: Diagnosis not present

## 2022-06-06 DIAGNOSIS — R109 Unspecified abdominal pain: Secondary | ICD-10-CM | POA: Diagnosis not present

## 2022-06-06 DIAGNOSIS — K219 Gastro-esophageal reflux disease without esophagitis: Secondary | ICD-10-CM | POA: Diagnosis not present

## 2022-06-06 DIAGNOSIS — R262 Difficulty in walking, not elsewhere classified: Secondary | ICD-10-CM | POA: Diagnosis not present

## 2022-06-06 DIAGNOSIS — I10 Essential (primary) hypertension: Secondary | ICD-10-CM | POA: Diagnosis not present

## 2022-06-06 DIAGNOSIS — M160 Bilateral primary osteoarthritis of hip: Secondary | ICD-10-CM | POA: Diagnosis not present

## 2022-06-06 DIAGNOSIS — E119 Type 2 diabetes mellitus without complications: Secondary | ICD-10-CM | POA: Diagnosis not present

## 2022-06-06 DIAGNOSIS — R509 Fever, unspecified: Secondary | ICD-10-CM | POA: Diagnosis not present

## 2022-06-06 DIAGNOSIS — M6281 Muscle weakness (generalized): Secondary | ICD-10-CM | POA: Diagnosis not present

## 2022-06-06 DIAGNOSIS — M868X8 Other osteomyelitis, other site: Secondary | ICD-10-CM | POA: Diagnosis not present

## 2022-06-06 DIAGNOSIS — I252 Old myocardial infarction: Secondary | ICD-10-CM | POA: Diagnosis not present

## 2022-06-06 DIAGNOSIS — E785 Hyperlipidemia, unspecified: Secondary | ICD-10-CM | POA: Diagnosis not present

## 2022-06-06 DIAGNOSIS — G9341 Metabolic encephalopathy: Secondary | ICD-10-CM | POA: Diagnosis not present

## 2022-06-06 DIAGNOSIS — E118 Type 2 diabetes mellitus with unspecified complications: Secondary | ICD-10-CM | POA: Diagnosis not present

## 2022-06-06 DIAGNOSIS — M00251 Other streptococcal arthritis, right hip: Secondary | ICD-10-CM | POA: Diagnosis not present

## 2022-06-06 DIAGNOSIS — E782 Mixed hyperlipidemia: Secondary | ICD-10-CM | POA: Diagnosis not present

## 2022-06-06 DIAGNOSIS — I2510 Atherosclerotic heart disease of native coronary artery without angina pectoris: Secondary | ICD-10-CM | POA: Diagnosis not present

## 2022-06-06 DIAGNOSIS — R2681 Unsteadiness on feet: Secondary | ICD-10-CM | POA: Diagnosis not present

## 2022-06-06 DIAGNOSIS — D473 Essential (hemorrhagic) thrombocythemia: Secondary | ICD-10-CM | POA: Diagnosis not present

## 2022-06-06 DIAGNOSIS — D539 Nutritional anemia, unspecified: Secondary | ICD-10-CM | POA: Diagnosis not present

## 2022-06-06 DIAGNOSIS — M25451 Effusion, right hip: Secondary | ICD-10-CM | POA: Diagnosis not present

## 2022-06-06 DIAGNOSIS — D649 Anemia, unspecified: Secondary | ICD-10-CM | POA: Diagnosis not present

## 2022-06-06 DIAGNOSIS — D75839 Thrombocytosis, unspecified: Secondary | ICD-10-CM | POA: Diagnosis not present

## 2022-06-06 DIAGNOSIS — Z794 Long term (current) use of insulin: Secondary | ICD-10-CM | POA: Diagnosis not present

## 2022-06-06 LAB — CULTURE, BLOOD (ROUTINE X 2)
Culture: NO GROWTH
Culture: NO GROWTH
Special Requests: ADEQUATE

## 2022-06-06 LAB — BODY FLUID CULTURE W GRAM STAIN: Culture: NO GROWTH

## 2022-06-06 LAB — PROTIME-INR
INR: 2 — ABNORMAL HIGH (ref 0.8–1.2)
Prothrombin Time: 22 seconds — ABNORMAL HIGH (ref 11.4–15.2)

## 2022-06-06 LAB — EHRLICHIA ANTIBODY PANEL
E chaffeensis (HGE) Ab, IgG: NEGATIVE
E chaffeensis (HGE) Ab, IgM: NEGATIVE
E. Chaffeensis (HME) IgM Titer: NEGATIVE
E.Chaffeensis (HME) IgG: NEGATIVE

## 2022-06-06 LAB — GLUCOSE, CAPILLARY
Glucose-Capillary: 116 mg/dL — ABNORMAL HIGH (ref 70–99)
Glucose-Capillary: 199 mg/dL — ABNORMAL HIGH (ref 70–99)

## 2022-06-06 MED ORDER — DICLOFENAC SODIUM 1 % EX GEL: 2.0000 g | Freq: Four times a day (QID) | CUTANEOUS | Status: AC

## 2022-06-06 MED ORDER — SODIUM CHLORIDE 0.9 % IV SOLN
8.0000 mg/kg | Freq: Every day | INTRAVENOUS | Status: DC
  Administered 2022-06-06: 400 mg via INTRAVENOUS
  Filled 2022-06-06: qty 8

## 2022-06-06 MED ORDER — WARFARIN SODIUM 2.5 MG PO TABS
2.5000 mg | ORAL_TABLET | Freq: Once | ORAL | Status: DC
Start: 2022-06-06 — End: 2022-06-06

## 2022-06-06 MED ORDER — CEFTRIAXONE IV (FOR PTA / DISCHARGE USE ONLY): 2.0000 g | INTRAVENOUS | 0 refills | Status: AC

## 2022-06-06 MED ORDER — DAPTOMYCIN IV (FOR PTA / DISCHARGE USE ONLY): 400.0000 mg | INTRAVENOUS | 0 refills | Status: AC

## 2022-06-06 MED ORDER — INSULIN ASPART 100 UNIT/ML IJ SOLN: INTRAMUSCULAR | 11 refills | Status: AC

## 2022-06-06 MED ORDER — ACETAMINOPHEN 500 MG PO TABS: 1000.0000 mg | ORAL_TABLET | Freq: Three times a day (TID) | ORAL | 0 refills | Status: AC | PRN

## 2022-06-06 NOTE — Plan of Care (Signed)
  Problem: Education: Goal: Knowledge of General Education information will improve Description: Including pain rating scale, medication(s)/side effects and non-pharmacologic comfort measures Outcome: Adequate for Discharge   Problem: Health Behavior/Discharge Planning: Goal: Ability to manage health-related needs will improve Outcome: Adequate for Discharge   Problem: Clinical Measurements: Goal: Ability to maintain clinical measurements within normal limits will improve Outcome: Adequate for Discharge Goal: Will remain free from infection Outcome: Adequate for Discharge Goal: Diagnostic test results will improve Outcome: Adequate for Discharge Goal: Respiratory complications will improve Outcome: Adequate for Discharge Goal: Cardiovascular complication will be avoided Outcome: Adequate for Discharge   Problem: Activity: Goal: Risk for activity intolerance will decrease Outcome: Adequate for Discharge   Problem: Nutrition: Goal: Adequate nutrition will be maintained Outcome: Adequate for Discharge   Problem: Coping: Goal: Level of anxiety will decrease Outcome: Adequate for Discharge   Problem: Elimination: Goal: Will not experience complications related to bowel motility Outcome: Adequate for Discharge Goal: Will not experience complications related to urinary retention Outcome: Adequate for Discharge   Problem: Pain Managment: Goal: General experience of comfort will improve Outcome: Adequate for Discharge   Problem: Skin Integrity: Goal: Risk for impaired skin integrity will decrease Outcome: Adequate for Discharge   Problem: Education: Goal: Ability to describe self-care measures that may prevent or decrease complications (Diabetes Survival Skills Education) will improve Outcome: Adequate for Discharge Goal: Individualized Educational Video(s) Outcome: Adequate for Discharge   Problem: Coping: Goal: Ability to adjust to condition or change in health will  improve Outcome: Adequate for Discharge   Problem: Fluid Volume: Goal: Ability to maintain a balanced intake and output will improve Outcome: Adequate for Discharge   Problem: Health Behavior/Discharge Planning: Goal: Ability to identify and utilize available resources and services will improve Outcome: Adequate for Discharge Goal: Ability to manage health-related needs will improve Outcome: Adequate for Discharge   Problem: Metabolic: Goal: Ability to maintain appropriate glucose levels will improve Outcome: Adequate for Discharge   Problem: Nutritional: Goal: Maintenance of adequate nutrition will improve Outcome: Adequate for Discharge Goal: Progress toward achieving an optimal weight will improve Outcome: Adequate for Discharge   Problem: Tissue Perfusion: Goal: Adequacy of tissue perfusion will improve Outcome: Adequate for Discharge   Problem: Skin Integrity: Goal: Risk for impaired skin integrity will decrease Outcome: Adequate for Discharge

## 2022-06-06 NOTE — Progress Notes (Signed)
PHARMACY CONSULT NOTE FOR:  OUTPATIENT  PARENTERAL ANTIBIOTIC THERAPY (OPAT)  Indication: Septic hip arthritis Regimen: Daptomycin 400 mg IV every 24 hours and Rocephin 2g IV every 24 hours End date: 06/30/22  IV antibiotic discharge orders are pended. To discharging provider:  please sign these orders via discharge navigator,  Select New Orders & click on the button choice - Manage This Unsigned Work.    Thank you for allowing pharmacy to be a part of this patient's care.  Alycia Rossetti, PharmD, BCPS Infectious Diseases Clinical Pharmacist 06/06/2022 8:42 AM   **Pharmacist phone directory can now be found on [email or web address] ([email or web address]).  Listed under [place].

## 2022-06-06 NOTE — Progress Notes (Signed)
Patient refused CHG this morning stating it was too early.

## 2022-06-06 NOTE — Progress Notes (Signed)
Patient exited unit via wheel chair accompanied by daughter and volunteer transport personnel in no distress with all belongings.

## 2022-06-06 NOTE — Progress Notes (Signed)
ANTICOAGULATION CONSULT NOTE- follow-up Pharmacy Consult for Warfarin  Indication:  history of bilateral DVT in 2015  Patient Measurements: Height: '5\' 2"'$  (157.5 cm) Weight: 53.1 kg (117 lb) IBW/kg (Calculated) : 50.1 kg  Vital Signs: Temp: 98.5 F (36.9 C) (08/04 0810) Temp Source: Oral (08/04 0810) BP: 110/50 (08/04 0810) Pulse Rate: 70 (08/04 0810)  Labs: Recent Labs    06/04/22 0505 06/05/22 0505 06/06/22 0308  HGB 6.8* 8.8*  --   HCT 22.2* 27.0*  --   PLT 596* 700*  --   LABPROT 20.0* 22.6* 22.0*  INR 1.7* 2.0* 2.0*     Estimated Creatinine Clearance: 47.3 mL/min (by C-G formula based on SCr of 0.8 mg/dL).  Assessment: 76 yo female presented on 06/01/2022. Patient is on warfarin prior to admission for history of bilateral DVT in 2015. Patient on warfarin '5mg'$  Monday and Friday and 2.'5mg'$  all other days per Care Everywhere note from 05/13/22. Patient's family reported last dose PTA 7/29. Patient did not get a dose on 7/30 or 7/31. INR trending down quickley 3.7>2.6>1.7.    8/2 Hemoglobin low at 6.8 today, transfused PRBC x 1. No bleeding noted  8/3 AM update: INR is therapeutic today at 2.0   8/4 AM update: INR is therapeutic today at 2.0   Goal of Therapy:  INR 2-3 Monitor platelets by anticoagulation protocol: Yes   Plan:  Warfarin 2.'5mg'$  PO x 1 today (home dose) Monitor daily INR, CBC, clinical course, s/sx of bleed, PO intake/diet, Drug-Drug Interactions  Oluwatimilehin Balfour BS, PharmD, BCPS Clinical Pharmacist 06/06/2022 8:13 AM  Contact: [phone] after 3 PM  "Be curious, not judgmental..." -Jamal Maes

## 2022-06-06 NOTE — Discharge Summary (Addendum)
PATIENT DETAILS Name: [name] [name] Age: 76 y.o. Sex: female Date of Birth: 11-16-1945 MRN: [id number]. Admitting Physician: Jonetta Osgood, MD YBW:LSLHT, [name], MD  Admit Date: 06/01/2022 Discharge date: 06/06/2022  Recommendations for Outpatient Follow-up:  Follow up with PCP in 1-2 weeks Please obtain CMP/CBC in one week Please ensure follow-up infectious disease clinic Daptomycin/Rocephin x4 weeks-please fax weekly labs to infectious disease clinic. On Coumadin-check INR in the next 2-3 days then subsequently at the discretion of the attending MD at [facility].  Admitted From:  Home  Disposition: Skilled nursing facility   Discharge Condition: good  CODE STATUS:   Code Status: Partial Code   Diet recommendation:  Diet Order             Diet - low sodium heart healthy           Diet heart healthy/carb modified Room service appropriate? Yes; Fluid consistency: Thin  Diet effective now                    Brief Summary: Patient is a 76 y.o.  female with history of essential thrombocythemia (positive JAK2 mutation), CAD s/p PCI to LAD/LCx, DM-2, HTN, chronic cognitive dysfunction who presented with worsening intractable right hip pain.     Significant events: 7/30>> to ED with severe right hip pain-significantly confused after MRI.  Admit to [facility].  Unable to ambulate/bear weight due to pain. 7/31>> discussed with radiology-not enough effusion for diagnostic arthrocentesis.  Discussed with orthopedics [name], PA-C)-recommended intra-articular steroids.  IR reconsulted for intra-articular steroid injection-but during right hip injection during fluoroscopy-IR was able to get some synovial fluid out.   Significant studies: 7/30>> x-ray right hip/pelvis: Severe degenerative changes of the right hip-progressed from 2015. 7/30>> MRI right hip: No avascular necrosis-mild right hip joint effusion.  Mild to moderate superior right femoral acetabular osteoarthritis,  right subacromial.  Mild right subacromial/subdeltoid bursitis. 7/30>> synovial fluid-cell count 23,600-no crystals.   Significant microbiology data: 7/30>> blood culture: Negative. 7/30>> urine culture: E. coli/Staph epidermidis (likely contamination) 7/31>> synovial fluid culture: No growth 8/01>> blood culture: Negative.   Procedures: 7/31>> fluoroscopic guided arthrocentesis and intra-articular steroid injection to right hip.   Consults: Phone consult-orthopedics Interventional radiology ID    Brief Hospital Course: Fever-severe right hip pain: No further fever since starting IV antibiotics on 8/1.  Overall much better-right hip pain has essentially resolved.  Overall picture suspicious for culture-negative septic arthritis-however synovitic fluid cultures negative so far.  Evaluated by ID-recommendations are to continue daptomycin/Rocephin x4 weeks.   Acute metabolic/toxic encephalopathy: Occurred after she was given sedatives for MRI on admission.  This has resolved-she is awake and alert and at baseline.  Given history of cognitive issues-expect some amount of delirium during hospitalization.  Maintain delirium precautions.     Seizure disorder:  Continue Keppra.  History of essential thrombocythemia: Continue anagrelide/Hydrea-follow CBC-resume outpatient follow-up with Dr. [name] postdischarge.   Macrocytic anemia: Hemoglobin stable after 1 unit of PRBC transfusion in 8/2.  Follow CBC periodically.     Fluctuating leukopenia: Due to thrombocythemia/bone marrow involvement.   History of VTE: INR therapeutic-continue Coumadin-check INR frequently at SNF.  History of CAD: No anginal symptoms-follows with Dr. [name] statin/nitrates.   DM-2 (A1c 5.6 on 7/31): CBG stable with SSI.  Resume metformin on discharge.  GERD: Continue PPI   BMI: Estimated body mass index is 21.4 kg/m as calculated from the following:   Height as of this encounter: '5\' 2"'  (1.575 m).  Weight as of this encounter: 53.1 kg.      Pressure Ulcer: Pressure Injury 06/02/22 Buttocks Right Stage 1 -  Intact skin with non-blanchable redness of a localized area usually over a bony prominence. (Active)  06/02/22 1945  Location: Buttocks  Location Orientation: Right  Staging: Stage 1 -  Intact skin with non-blanchable redness of a localized area usually over a bony prominence.  Wound Description (Comments):   Present on Admission: Yes  Dressing Type Foam - Lift dressing to assess site every shift 06/06/22 0811   Discharge Diagnoses:  Principal Problem:   Ambulatory dysfunction Active Problems:   Right hip pain   Right hip joint effusion   Delirium due to multiple etiologies, acute, hyperactive   CAD (coronary artery disease)   DM2 (diabetes mellitus, type 2) (HCC)   Chronic anemia   Essential thrombocythemia ([place])   Memory loss   Chronic anticoagulation   History of seizure   Hypoxia   Pressure injury of skin   Discharge Instructions:  Activity:  As tolerated  Discharge Instructions     [facility] pharmacist to adjust dose for Vancomycin, Aminoglycosides and other anti-infective therapies as requested by physician.   Complete by: As directed    [facility] to provide Cath Flo 60m   Complete by: As directed    Administer for PICC line occlusion and as ordered by physician for other access device issues.   Anaphylaxis Kit: Provided to treat any anaphylactic reaction to the medication being provided to the patient if First Dose or when requested by physician   Complete by: As directed    Epinephrine 1678mml vial / amp: Administer 0.78m378m0.78ml57mubcutaneously once for moderate to severe anaphylaxis, nurse to call physician and pharmacy when reaction occurs and call 911 if needed for immediate care   Diphenhydramine 50mg17mIV vial: Administer 25-50mg 38mM PRN for first dose reaction, rash, itching, mild reaction, nurse to call physician and  pharmacy when reaction occurs   Sodium Chloride 0.9% NS 500ml I378mdminister if needed for hypovolemic blood pressure drop or as ordered by physician after call to physician with anaphylactic reaction   Call MD for:  extreme fatigue   Complete by: As directed    Call MD for:  persistant dizziness or light-headedness   Complete by: As directed    Call MD for:  redness, tenderness, or signs of infection (pain, swelling, redness, odor or green/yellow discharge around incision site)   Complete by: As directed    Call MD for:  temperature >100.4   Complete by: As directed    Change dressing on IV access line weekly and PRN   Complete by: As directed    Diet - low sodium heart healthy   Complete by: As directed    Discharge instructions   Complete by: As directed    Follow with Primary MD  Russo, [name] 1-2 weeks  Follow-up with infectious disease as instructed  Check CBGs before meals and at bedtime.  Please get a complete blood count and chemistry panel checked by your Primary MD at your next visit, and again as instructed by your Primary MD.  Get Medicines reviewed and adjusted: Please take all your medications with you for your next visit with your Primary MD  Laboratory/radiological data: Please request your Primary MD to go over all hospital tests and procedure/radiological results at the follow up, please ask your Primary MD to get all Hospital records sent to his/her office.  In some cases, they will be blood work, cultures and biopsy results pending at the time of your discharge. Please request that your primary care M.D. follows up on these results.  Also Note the following: If you experience worsening of your admission symptoms, develop shortness of breath, life threatening emergency, suicidal or homicidal thoughts you must seek medical attention immediately by calling 911 or calling your MD immediately  if symptoms less severe.  You must read complete  instructions/literature along with all the possible adverse reactions/side effects for all the Medicines you take and that have been prescribed to you. Take any new Medicines after you have completely understood and accpet all the possible adverse reactions/side effects.   Do not drive when taking Pain medications or sleeping medications (Benzodaizepines)  Do not take more than prescribed Pain, Sleep and Anxiety Medications. It is not advisable to combine anxiety,sleep and pain medications without talking with your primary care practitioner  Special Instructions: If you have smoked or chewed Tobacco  in the last 2 yrs please stop smoking, stop any regular Alcohol  and or any Recreational drug use.  Wear Seat belts while driving.  Please note: You were cared for by a hospitalist during your hospital stay. Once you are discharged, your primary care physician will handle any further medical issues. Please note that NO REFILLS for any discharge medications will be authorized once you are discharged, as it is imperative that you return to your primary care physician (or establish a relationship with a primary care physician if you do not have one) for your post hospital discharge needs so that they can reassess your need for medications and monitor your lab values.   Flush IV access with Sodium Chloride 0.9% and Heparin 10 units/ml or 100 units/ml   Complete by: As directed    Home infusion instructions - [facility]   Complete by: As directed    Instructions: Flush IV access with Sodium Chloride 0.9% and Heparin 10units/ml or 100units/ml   Change dressing on IV access line: Weekly and PRN   Instructions Cath Flo 71m: Administer for PICC Line occlusion and as ordered by physician for other access device   [facility] pharmacist to adjust dose for: Vancomycin, Aminoglycosides and other anti-infective therapies as requested by physician   Increase activity slowly   Complete by: As  directed    Method of administration may be changed at the discretion of home infusion pharmacist based upon assessment of the patient and/or caregiver's ability to self-administer the medication ordered   Complete by: As directed    No wound care   Complete by: As directed       Allergies as of 06/06/2022       Reactions   Ampicillin Hives, Swelling, Rash   Avelox [moxifloxacin Hcl In Nacl] Swelling   Bactrim [sulfamethoxazole-trimethoprim] Hives, Swelling, Rash   Ibuprofen Other (See Comments)   rash   Penicillins Other (See Comments)   rash   Diphenhydramine    Other reaction(s): rash   Sulfa Antibiotics    Other reaction(s): anaphylaxis   Codeine Rash   unknown   Latex Rash   unknown   Whey Rash   Regular dairy ok        Medication List     STOP taking these medications    prochlorperazine 10 MG tablet Commonly known as: COMPAZINE       TAKE these medications    acetaminophen 500 MG tablet Commonly known as: TYLENOL Take  2 tablets (1,000 mg total) by mouth every 8 (eight) hours as needed.   anagrelide 0.5 MG capsule Commonly known as: AGRYLIN Take 1 capsule (0.5 mg total) by mouth daily.   cefTRIAXone  IVPB Commonly known as: ROCEPHIN Inject 2 g into the vein daily for 24 days. Indication:  Septic hip arthritis First Dose: Yes Last Day of Therapy:  06/30/22 Labs - Once weekly:  CBC/D and BMP, Labs - Every other week:  ESR and CRP Method of administration: IV Push Pull PICC line at the completion of IV therapy Method of administration may be changed at the discretion of home infusion pharmacist based upon assessment of the patient and/or caregiver's ability to self-administer the medication ordered.   daptomycin  IVPB Commonly known as: CUBICIN Inject 400 mg into the vein daily for 24 days. Indication:  Septic hip arthritis First Dose: Yes Last Day of Therapy:  06/30/22 Labs - Once weekly:  CBC/D, BMP, and CPK Labs - Every other week:  ESR and  CRP Method of administration: IV Push Pull PICC line at the completion of IV therapy Method of administration may be changed at the discretion of home infusion pharmacist based upon assessment of the patient and/or caregiver's ability to self-administer the medication ordered.   diclofenac Sodium 1 % Gel Commonly known as: VOLTAREN Apply 2 g topically 4 (four) times daily.   fenofibrate micronized 200 MG capsule Commonly known as: LOFIBRA TAKE ONE CAPSULE BY MOUTH ONCE DAILY BEFORE BREAKFAST   ferrous sulfate 325 (65 FE) MG EC tablet Take 325 mg by mouth daily as needed (low iron).   Fish Oil + D3 1000-1000 MG-UNIT Caps Take 1 tablet by mouth 3 (three) times daily.   hydroxyurea 500 MG capsule Commonly known as: HYDREA TAKE 1 CAPSULE BY MOUTH ONCE DAILY. MAY TAKE WITH FOOD TO  MINIMIZE  GI  SIDE  EFFECTS.   insulin aspart 100 UNIT/ML injection Commonly known as: novoLOG 0-15 Units, Subcutaneous, 3 times daily with meals CBG < 70: Implement Hypoglycemia measures CBG 70 - 120: 0 units CBG 121 - 150: 2 units CBG 151 - 200: 3 units CBG 201 - 250: 5 units CBG 251 - 300: 8 units CBG 301 - 350: 11 units CBG 351 - 400: 15 units CBG > 400: call MD   levETIRAcetam 250 MG tablet Commonly known as: Keppra Take 1 tablet (250 mg total) by mouth 2 (two) times daily.   metFORMIN 500 MG tablet Commonly known as: GLUCOPHAGE Take 1 tablet by mouth daily.   nitroGLYCERIN 0.4 MG SL tablet Commonly known as: NITROSTAT Place 1 tablet (0.4 mg total) under the tongue every 5 (five) minutes as needed. For chest pain.   omeprazole 40 MG capsule Commonly known as: PRILOSEC Take 40 mg by mouth every evening.   rosuvastatin 20 MG tablet Commonly known as: CRESTOR Take 10 mg by mouth every evening.   warfarin 5 MG tablet Commonly known as: COUMADIN Take 5 mg by mouth See admin instructions. Take 1 tablet (5 mg) on Wednesdays and Fridays then take 1/2 tablet (2.5 mg) on Mondays, Tuesdays, Thursdays,  Saturdays and Sundays               Discharge Care Instructions  (From admission, onward)           Start     Ordered   06/06/22 0000  Change dressing on IV access line weekly and PRN  (Home infusion instructions - [facility] )  06/06/22 0931            Follow-up Information     [name], MD. Schedule an appointment as soon as possible for a visit in 1 week(s).   Specialty: Internal Medicine Contact information: [address] [place] [address] [phone]         Nahser, Wonda Cheng, MD Follow up in 1 month(s).   Specialty: Cardiology Contact information: [place] [address] [phone]         Curt Bears, MD Follow up in 2 week(s).   Specialty: Oncology Contact information: [place] [address] [phone]         Carlyle Basques, MD Follow up.   Specialty: Infectious Diseases Why: Office will call with date/time Contact information: [address] [place] [address] [phone]                Allergies  Allergen Reactions   Ampicillin Hives, Swelling and Rash   Avelox [Moxifloxacin Hcl In Nacl] Swelling   Bactrim [Sulfamethoxazole-Trimethoprim] Hives, Swelling and Rash   Ibuprofen Other (See Comments)    rash   Penicillins Other (See Comments)    rash   Diphenhydramine     Other reaction(s): rash   Sulfa Antibiotics     Other reaction(s): anaphylaxis   Codeine Rash    unknown   Latex Rash    unknown   Whey Rash    Regular dairy ok     Other Procedures/Studies: [place] EKG SITE RITE  Result Date: 06/05/2022 If Site Rite image not attached, placement could not be confirmed due to current cardiac rhythm.  VAS [place] LOWER EXTREMITY VENOUS (DVT) (ONLY [facility] & [facility])  Result Date: 06/03/2022  Lower Venous DVT Study Patient Name:  BIJAL SIGLIN  Date of Exam:   06/02/2022 Medical Rec #: [id number]         Accession #:     [id number] Date of Birth: Jun 23, 1946         Patient Gender: F Patient Age:   76 years Exam Location:  [facility] Procedure:      VAS [place] LOWER EXTREMITY VENOUS (DVT) Referring Phys: Judd Gaudier --------------------------------------------------------------------------------  Indications: Pain (hip and groin). Other Indications: Severe degenerative changes of the right hip. Comparison Study: Prior bilateral LEV done 07/07/14 indicating DVT in the                   bilateral lower extremities. Performing Technologist: Sharion Dove RVS  Examination Guidelines: A complete evaluation includes B-mode imaging, spectral Doppler, color Doppler, and power Doppler as needed of all accessible portions of each vessel. Bilateral testing is considered an integral part of a complete examination. Limited examinations for reoccurring indications may be performed as noted. The reflux portion of the exam is performed with the patient in reverse Trendelenburg.  +---------+---------------+---------+-----------+----------+-------------------+ RIGHT    CompressibilityPhasicitySpontaneityPropertiesThrombus Aging      +---------+---------------+---------+-----------+----------+-------------------+ CFV      Full           Yes      Yes                                      +---------+---------------+---------+-----------+----------+-------------------+ SFJ      Full                                                             +---------+---------------+---------+-----------+----------+-------------------+  FV Prox  Full                                                             +---------+---------------+---------+-----------+----------+-------------------+ FV Mid   Full                                                             +---------+---------------+---------+-----------+----------+-------------------+ FV DistalFull                                                              +---------+---------------+---------+-----------+----------+-------------------+ PFV      Full                                                             +---------+---------------+---------+-----------+----------+-------------------+ POP                     Yes      Yes                  patent by color and                                                       Doppler             +---------+---------------+---------+-----------+----------+-------------------+ PTV      Full                                                             +---------+---------------+---------+-----------+----------+-------------------+ PERO     Full                                                             +---------+---------------+---------+-----------+----------+-------------------+   +----+---------------+---------+-----------+----------+--------------+ LEFTCompressibilityPhasicitySpontaneityPropertiesThrombus Aging +----+---------------+---------+-----------+----------+--------------+ CFV Full           Yes      Yes                                 +----+---------------+---------+-----------+----------+--------------+     Summary: RIGHT: - There is no evidence of deep vein thrombosis in the lower extremity.  - No cystic structure found in the  popliteal fossa.  LEFT: - No evidence of common femoral vein obstruction.  *See table(s) above for measurements and observations. Electronically signed by Orlie Pollen on 06/03/2022 at 12:45:19 PM.    Final    DG FLUORO GUIDED NEEDLE PLC ASPIRATION/INJECTION LOC  Result Date: 06/02/2022 CLINICAL DATA:  Right hip pain. EXAM: RIGHT HIP INJECTION UNDER FLUOROSCOPY COMPARISON:  Right hip radiographs and MRI 06/01/2022 FLUOROSCOPY: Radiation Exposure Index (as provided by the fluoroscopic device): 3.10 mGy Kerma PROCEDURE: Telephone consent was obtained from the patient's daughter. Risks and benefits of the procedure were discussed. We were  initially asked to perform a right hip aspiration, however MRI demonstrated only a very small joint effusion. A request was later made for a therapeutic intra-articular steroid injection. A time-out was performed prior to the start of the procedure. Overlying skin prepped with Betadine, draped in the usual sterile fashion, and infiltrated locally with 1% lidocaine. A 22 gauge spinal needle advanced to the lateral aspect of the right femoral head-neck junction. When the stylet was removed, there was spontaneous return of blood-tinged synovial fluid. Approximately 2 mL of this fluid was collected and sent to the lab. Diagnostic injection of iodinated contrast demonstrated intra-articular spread without intravascular component. 80 mg Depo-Medrol and 5 ml Sensorcaine 0.5% were then administered. No immediate complication. IMPRESSION: Technically successful right hip injection under fluoroscopy. Read by: [name], NP Electronically Signed   By: Logan Bores M.D.   On: 06/02/2022 15:18   DG Chest Portable 1 View  Result Date: 06/02/2022 CLINICAL DATA:  Altered mental status. EXAM: PORTABLE CHEST 1 VIEW COMPARISON:  Chest radiograph dated 08/14/2021. FINDINGS: Background of emphysema and chronic interstitial coarsening. Diffuse hazy interstitial densities throughout the lungs, right greater left, new since the prior radiograph and may represent mild edema. Atypical pneumonia is not excluded clinical correlation is recommended. No consolidative changes. There is no pleural effusion pneumothorax. Stable cardiac silhouette. Atherosclerotic calcification of the aorta. No acute osseous pathology. IMPRESSION: Diffuse hazy interstitial densities throughout the lungs, right greater left may represent mild edema. Atypical pneumonia is not excluded. Electronically Signed   By: Anner Crete M.D.   On: 06/02/2022 00:13   MR HIP RIGHT W WO CONTRAST  Result Date: 06/01/2022 CLINICAL DATA:  Hip pain.  Osteonecrosis  suspected. EXAM: MRI OF THE RIGHT HIP WITHOUT AND WITH CONTRAST TECHNIQUE: Multiplanar, multisequence MR imaging was performed both before and after administration of intravenous contrast. CONTRAST:  5.23m GADAVIST GADOBUTROL 1 MMOL/ML IV SOLN COMPARISON:  Pelvis and right hip radiographs 06/01/2022. FINDINGS: Despite efforts by the technologist and patient, moderate to high-grade motion artifact is present on today's exam and could not be eliminated. This reduces exam sensitivity and specificity. Bones: No acute fracture or avascular necrosis is seen within limitations of the motion artifact. Mild pubic symphysis joint space narrowing and peripheral osteophytosis. Articular cartilage and labrum Articular cartilage: Within the limitations of patient motion artifact, there are mild subchondral cystic changes within the superolateral aspect of the humeral head. Likely mild superior femoral head and acetabular cartilage thinning. Labrum: Motion artifact limits evaluation. Suspected anterior superior acetabular labrum degenerative tears. Joint or bursal effusion Joint effusion: Mild right hip joint effusion. No left hip joint effusion. Bursae: Mild fluid within the right subacromial/subdeltoid bursa. Muscles and tendons Muscles and tendons: The origins of the bilateral common hamstring tendons and the rectus femoris tendons are intact. Within the limitations of motion artifact, no muscle tear is seen. Other findings Miscellaneous:   None. IMPRESSION: 1. Unfortunately,  motion artifact mildly to markedly technically degrades this examination. 2. No definite avascular necrosis is seen. 3. Mild right hip joint effusion. Superior right acetabular subchondral degenerative cystic change with mild-to-moderate superior right femoroacetabular osteoarthritis. 4. Mild right subacromial/subdeltoid bursitis. Electronically Signed   By: Yvonne Kendall M.D.   On: 06/01/2022 22:06   DG Hip Unilat W or Wo Pelvis 2-3 Views  Right  Result Date: 06/01/2022 CLINICAL DATA:  Right hip pain EXAM: DG HIP (WITH OR WITHOUT PELVIS) 2-3V RIGHT COMPARISON:  07/13/2014 FINDINGS: Severe degenerative changes of the right hip joint, progressed from 2015. No evidence of fracture or dislocation. No erosion or periosteal elevation is seen. No appreciable soft tissue abnormality. IMPRESSION: Severe degenerative changes of the right hip, progressed from 2015. Electronically Signed   By: Davina Poke D.O.   On: 06/01/2022 14:49     TODAY-DAY OF DISCHARGE:  Subjective:   [name] [name] today has no headache,no chest abdominal pain,no new weakness tingling or numbness, feels much better wants to go home today.  Objective:   Blood pressure (!) 110/50, pulse 70, temperature 98.5 F (36.9 C), temperature source Oral, resp. rate 18, height '5\' 2"'  (1.575 m), weight 53.1 kg, SpO2 98 %.  Intake/Output Summary (Last 24 hours) at 06/06/2022 0935 Last data filed at 06/06/2022 0300 Gross per 24 hour  Intake 10 ml  Output --  Net 10 ml   Filed Weights   06/01/22 1233  Weight: 53.1 kg    Exam: Awake Alert, Oriented *3, No new F.N deficits, Normal affect [place].AT,PERRAL Supple Neck,No JVD, No cervical lymphadenopathy appriciated.  Symmetrical Chest wall movement, Good air movement bilaterally, CTAB RRR,No Gallops,Rubs or new Murmurs, No Parasternal Heave +ve B.Sounds, Abd Soft, Non tender, No organomegaly appriciated, No rebound -guarding or rigidity. No Cyanosis, Clubbing or edema, No new Rash or bruise   PERTINENT RADIOLOGIC STUDIES: [place] EKG SITE RITE  Result Date: 06/05/2022 If Site Rite image not attached, placement could not be confirmed due to current cardiac rhythm.    PERTINENT LAB RESULTS: CBC: Recent Labs    06/04/22 0505 06/05/22 0505  WBC 4.9 5.9  HGB 6.8* 8.8*  HCT 22.2* 27.0*  PLT 596* 700*   CMET CMP     Component Value Date/Time   NA 141 06/03/2022 0209   NA 141 01/29/2021 1107   K 4.0 06/03/2022 0209    CL 108 06/03/2022 0209   CO2 26 06/03/2022 0209   GLUCOSE 158 (H) 06/03/2022 0209   BUN 13 06/03/2022 0209   BUN 17 01/29/2021 1107   CREATININE 0.80 06/03/2022 0209   CREATININE 0.79 05/20/2022 1325   CALCIUM 8.6 (L) 06/03/2022 0209   PROT 5.7 (L) 06/01/2022 1355   PROT 6.0 01/04/2020 1006   ALBUMIN 3.6 06/01/2022 1355   ALBUMIN 4.8 (H) 01/04/2020 1006   AST 20 06/01/2022 1355   AST 19 05/20/2022 1325   ALT 11 06/01/2022 1355   ALT 9 05/20/2022 1325   ALKPHOS 34 (L) 06/01/2022 1355   BILITOT 0.9 06/01/2022 1355   BILITOT 0.4 05/20/2022 1325   GFRNONAA >60 06/03/2022 0209   GFRNONAA >60 05/20/2022 1325   GFRAA >60 07/10/2020 1152    GFR Estimated Creatinine Clearance: 47.3 mL/min (by C-G formula based on SCr of 0.8 mg/dL). No results for input(s): "LIPASE", "AMYLASE" in the last 72 hours. No results for input(s): "CKTOTAL", "CKMB", "CKMBINDEX", "TROPONINI" in the last 72 hours. Invalid input(s): "POCBNP" No results for input(s): "DDIMER" in the last 72 hours. No results  for input(s): "HGBA1C" in the last 72 hours. No results for input(s): "CHOL", "HDL", "LDLCALC", "TRIG", "CHOLHDL", "LDLDIRECT" in the last 72 hours. No results for input(s): "TSH", "T4TOTAL", "T3FREE", "THYROIDAB" in the last 72 hours.  Invalid input(s): "FREET3" No results for input(s): "VITAMINB12", "FOLATE", "FERRITIN", "TIBC", "IRON", "RETICCTPCT" in the last 72 hours. Coags: Recent Labs    06/05/22 0505 06/06/22 0308  INR 2.0* 2.0*   Microbiology: Recent Results (from the past 240 hour(s))  Blood culture (routine x 2)     Status: None (Preliminary result)   Collection Time: 06/01/22  3:40 PM   Specimen: BLOOD RIGHT FOREARM  Result Value Ref Range Status   Specimen Description BLOOD RIGHT FOREARM  Final   Special Requests   Final    BOTTLES DRAWN AEROBIC AND ANAEROBIC Blood Culture results may not be optimal due to an inadequate volume of blood received in culture bottles   Culture   Final     NO GROWTH 4 DAYS Performed at [facility], [place] [address] [place], [place] [address]    Report Status PENDING  Incomplete  Blood culture (routine x 2)     Status: None (Preliminary result)   Collection Time: 06/01/22  5:55 PM   Specimen: BLOOD  Result Value Ref Range Status   Specimen Description BLOOD LEFT ANTECUBITAL  Final   Special Requests   Final    BOTTLES DRAWN AEROBIC AND ANAEROBIC Blood Culture adequate volume   Culture   Final    NO GROWTH 4 DAYS Performed at [facility], [place] [address] [place], [place] [address]    Report Status PENDING  Incomplete  Urine Culture     Status: Abnormal   Collection Time: 06/01/22 11:03 PM   Specimen: Urine, Clean Catch  Result Value Ref Range Status   Specimen Description URINE, CLEAN CATCH  Final   Special Requests   Final    NONE Performed at [facility], [place] [address], [place],  [address]    Culture (A)  Final    40,000 COLONIES/mL STAPHYLOCOCCUS EPIDERMIDIS 40,000 COLONIES/mL ESCHERICHIA COLI    Report Status 06/03/2022 FINAL  Final   Organism ID, Bacteria STAPHYLOCOCCUS EPIDERMIDIS (A)  Final   Organism ID, Bacteria ESCHERICHIA COLI (A)  Final      Susceptibility   Escherichia coli - MIC*    AMPICILLIN 8 SENSITIVE Sensitive     CEFAZOLIN <=4 SENSITIVE Sensitive     CEFEPIME <=0.12 SENSITIVE Sensitive     CEFTRIAXONE <=0.25 SENSITIVE Sensitive     CIPROFLOXACIN <=0.25 SENSITIVE Sensitive     GENTAMICIN <=1 SENSITIVE Sensitive     IMIPENEM <=0.25 SENSITIVE Sensitive     NITROFURANTOIN <=16 SENSITIVE Sensitive     TRIMETH/SULFA <=20 SENSITIVE Sensitive     AMPICILLIN/SULBACTAM 4 SENSITIVE Sensitive     PIP/TAZO <=4 SENSITIVE Sensitive     * 40,000 COLONIES/mL ESCHERICHIA COLI   Staphylococcus epidermidis - MIC*    CIPROFLOXACIN <=0.5 SENSITIVE Sensitive     GENTAMICIN <=0.5 SENSITIVE Sensitive     NITROFURANTOIN <=16 SENSITIVE Sensitive     OXACILLIN >=4 RESISTANT Resistant      TETRACYCLINE <=1 SENSITIVE Sensitive     VANCOMYCIN 1 SENSITIVE Sensitive     TRIMETH/SULFA <=10 SENSITIVE Sensitive     CLINDAMYCIN <=0.25 SENSITIVE Sensitive     RIFAMPIN <=0.5 SENSITIVE Sensitive     Inducible Clindamycin NEGATIVE Sensitive     * 40,000 COLONIES/mL STAPHYLOCOCCUS EPIDERMIDIS  Anaerobic culture w Gram  Stain     Status: None (Preliminary result)   Collection Time: 06/02/22  2:37 PM   Specimen: PATH Cytology Misc. fluid; Synovial Fluid  Result Value Ref Range Status   Specimen Description SYNOVIAL  Final   Special Requests RIGHT HIP  Final   Gram Stain   Final    ABUNDANT WBC PRESENT,BOTH PMN AND MONONUCLEAR NO ORGANISMS SEEN Performed at [facility], [address] [place], [place] [address]    Culture   Final    NO ANAEROBES ISOLATED; CULTURE IN PROGRESS FOR 5 DAYS   Report Status PENDING  Incomplete  Body fluid culture w Gram Stain     Status: None   Collection Time: 06/02/22  2:37 PM   Specimen: PATH Cytology Misc. fluid; Synovial Fluid  Result Value Ref Range Status   Specimen Description SYNOVIAL  Final   Special Requests RIGHT HIP  Final   Gram Stain   Final    FEW WBC PRESENT, PREDOMINANTLY MONONUCLEAR NO ORGANISMS SEEN    Culture   Final    NO GROWTH Performed at [facility], [address] [place], [place] [address]    Report Status 06/06/2022 FINAL  Final  Culture, blood (Routine X 2) w Reflex to ID Panel     Status: None (Preliminary result)   Collection Time: 06/03/22 10:21 AM   Specimen: BLOOD  Result Value Ref Range Status   Specimen Description BLOOD RIGHT ANTECUBITAL  Final   Special Requests   Final    BOTTLES DRAWN AEROBIC AND ANAEROBIC Blood Culture adequate volume   Culture   Final    NO GROWTH 2 DAYS Performed at [facility], [place] [address], [place], [place] [address]    Report Status PENDING  Incomplete  Culture, blood (Routine X 2) w Reflex to ID Panel     Status: None (Preliminary result)   Collection  Time: 06/03/22 10:23 AM   Specimen: BLOOD  Result Value Ref Range Status   Specimen Description BLOOD LEFT ANTECUBITAL  Final   Special Requests   Final    BOTTLES DRAWN AEROBIC AND ANAEROBIC Blood Culture adequate volume   Culture   Final    NO GROWTH 2 DAYS Performed at [facility], [place] [address] [place], [place] [address]    Report Status PENDING  Incomplete    FURTHER DISCHARGE INSTRUCTIONS:  Get Medicines reviewed and adjusted: Please take all your medications with you for your next visit with your Primary MD  Laboratory/radiological data: Please request your Primary MD to go over all hospital tests and procedure/radiological results at the follow up, please ask your Primary MD to get all Hospital records sent to his/her office.  In some cases, they will be blood work, cultures and biopsy results pending at the time of your discharge. Please request that your primary care M.D. goes through all the records of your hospital data and follows up on these results.  Also Note the following: If you experience worsening of your admission symptoms, develop shortness of breath, life threatening emergency, suicidal or homicidal thoughts you must seek medical attention immediately by calling 911 or calling your MD immediately  if symptoms less severe.  You must read complete instructions/literature along with all the possible adverse reactions/side effects for all the Medicines you take and that have been prescribed to you. Take any new Medicines after you have completely understood and accpet all the possible adverse reactions/side effects.   Do not  drive when taking Pain medications or sleeping medications (Benzodaizepines)  Do not take more than prescribed Pain, Sleep and Anxiety Medications. It is not advisable to combine anxiety,sleep and pain medications without talking with your primary care practitioner  Special Instructions: If you have smoked or chewed Tobacco  in the last  2 yrs please stop smoking, stop any regular Alcohol  and or any Recreational drug use.  Wear Seat belts while driving.  Please note: You were cared for by a hospitalist during your hospital stay. Once you are discharged, your primary care physician will handle any further medical issues. Please note that NO REFILLS for any discharge medications will be authorized once you are discharged, as it is imperative that you return to your primary care physician (or establish a relationship with a primary care physician if you do not have one) for your post hospital discharge needs so that they can reassess your need for medications and monitor your lab values.  Total Time spent coordinating discharge including counseling, education and face to face time equals greater than 30 minutes.  SignedOren Binet 06/06/2022 9:35 AM

## 2022-06-06 NOTE — TOC Progression Note (Signed)
Transition of Care [facility]) - Progression Note    Patient Details  Name: MAGDALENE TARDIFF MRN: [id number] Date of Birth: Dec 21, 1945  Transition of Care [facility]) CM/SW [place], LCSW Phone Number: 06/06/2022, 8:45 AM  Clinical Narrative:    [facility] patient on IV Dapto. CSW made them aware of patient's dosing schedule today and will plan for discharge afterwards.    Expected Discharge Plan: [place] Barriers to Discharge: Continued Medical Work up  Expected Discharge Plan and Services Expected Discharge Plan: [place] In-house Referral: Clinical Social Work   Post Acute Care Choice: [place] Living arrangements for the past 2 months: Single Family Home                                       Social Determinants of Health (SDOH) Interventions    Readmission Risk Interventions     No data to display

## 2022-06-06 NOTE — Progress Notes (Signed)
Occupational Therapy Treatment Patient Details Name: [name] [name] MRN: [id number] DOB: 1946-06-26 Today's Date: 06/06/2022   History of present illness Pt is a 76 y/o female admitted secondary to intractable R hip pain. 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 Workup for septic arthritis of R hip. PMH includes thrombocythemia (positive JAK2 mutation), CAD s/p PCI, DM-2, HTN,   OT comments  Pt. Seen for skilled OT session.  Reports R hip/leg feeling good today.  Able to complete seated LB dressing task with utilization of energy conservation strategies and safe tech. To avoid bending and leaning forward.  Agree with current d/c recommendations and pt. Will cont. To benefit from skilled therapies prior to home.     Recommendations for follow up therapy are one component of a multi-disciplinary discharge planning process, led by the attending physician.  Recommendations may be updated based on patient status, additional functional criteria and insurance authorization.    Follow Up Recommendations  Skilled nursing-short term rehab (<3 hours/day)    Assistance Recommended at Discharge Frequent or constant Supervision/Assistance  Patient can return home with the following  A lot of help with walking and/or transfers;A lot of help with bathing/dressing/bathroom   Equipment Recommendations  BSC/3in1    Recommendations for Other Services      Precautions / Restrictions Precautions Precautions: Fall       Mobility Bed Mobility                    Transfers                         Balance                                           ADL either performed or assessed with clinical judgement   ADL Overall ADL's : Needs assistance/impaired                     Lower Body Dressing: Set up;Sitting/lateral leans Lower Body Dressing Details (indicate cue  type and reason): able to demonstrate safety with crossing each leg over knee for don/doff lb clothing.   Toilet Transfer Details (indicate cue type and reason): declined need to go but reports she has been ambulating to/from b.room                Extremity/Trunk Assessment              Vision       Perception     Praxis      Cognition Arousal/Alertness: Awake/alert Behavior During Therapy: WFL for tasks assessed/performed Overall Cognitive Status: Within Functional Limits for tasks assessed                                          Exercises      Shoulder Instructions       General Comments  LOVES her 2 dogs at home and can not wait to get to them!!!    Pertinent Vitals/ Pain       Pain Assessment Pain Assessment: No/denies pain  Home Living  Prior Functioning/Environment              Frequency  Min 2X/week        Progress Toward Goals  OT Goals(current goals can now be found in the care plan section)  Progress towards OT goals: Progressing toward goals     Plan Discharge plan remains appropriate    Co-evaluation                 AM-PAC OT "6 Clicks" Daily Activity     Outcome Measure   Help from another person eating meals?: A Little Help from another person taking care of personal grooming?: A Little Help from another person toileting, which includes using toliet, bedpan, or urinal?: A Lot Help from another person bathing (including washing, rinsing, drying)?: A Lot Help from another person to put on and taking off regular upper body clothing?: A Little Help from another person to put on and taking off regular lower body clothing?: A Lot 6 Click Score: 15    End of Session    OT Visit Diagnosis: Unsteadiness on feet (R26.81);Other abnormalities of gait and mobility (R26.89);Muscle weakness (generalized) (M62.81);Pain Pain - Right/Left: Right Pain -  part of body: Leg   Activity Tolerance Patient tolerated treatment well   Patient Left in chair;with call bell/phone within reach   Nurse Communication          Time: 6503-5465 OT Time Calculation (min): 17 min  Charges: OT General Charges $OT Visit: 1 Visit OT Treatments $Self Care/Home Management : 8-22 mins  Sonia Baller, COTA/L Acute Rehabilitation [phone]   Tanya Nones 06/06/2022, 12:36 PM

## 2022-06-06 NOTE — TOC Transition Note (Signed)
Transition of Care [facility]) - CM/SW Discharge Note   Patient Details  Name: [name] [name] MRN: [id number] Date of Birth: 1946/07/24  [facility] of [facility] CM/SW Contact:  Benard Halsted, LCSW Phone Number: 06/06/2022, 10:55 AM   Clinical Narrative:    Patient will DC to: [facility] Anticipated DC date: 06/06/22 Family notified: Daughter, Charity fundraiser by: Family by car after last dose of abx   Per MD patient ready for DC to [facility]. RN to call report prior to discharge [phone], ask for Kathrene Alu, room 150). RN, patient, patient's family, and facility notified of DC. Discharge Summary and FL2 sent to facility.   CSW will sign off for now as social work intervention is no longer needed. Please consult [place] again if new needs arise.     Final next level of care: Skilled Nursing Facility Barriers to Discharge: Barriers Resolved   Patient Goals and CMS Choice Patient states their goals for this hospitalization and ongoing recovery are:: Rehab CMS Medicare.gov Compare Post Acute Care list provided to:: Patient Represenative (must comment) Choice offered to / list presented to : Patient, Adult Children  Discharge Placement                       Discharge Plan and Services In-house Referral: Clinical Social Work   Post Acute Care Choice: [place]                               Social Determinants of Health (SDOH) Interventions     Readmission Risk Interventions     No data to display

## 2022-06-07 LAB — ANAEROBIC CULTURE W GRAM STAIN

## 2022-06-08 LAB — CULTURE, BLOOD (ROUTINE X 2)
Culture: NO GROWTH
Culture: NO GROWTH
Special Requests: ADEQUATE
Special Requests: ADEQUATE

## 2022-06-09 DIAGNOSIS — G40909 Epilepsy, unspecified, not intractable, without status epilepticus: Secondary | ICD-10-CM | POA: Diagnosis not present

## 2022-06-09 DIAGNOSIS — K219 Gastro-esophageal reflux disease without esophagitis: Secondary | ICD-10-CM | POA: Diagnosis not present

## 2022-06-09 DIAGNOSIS — E118 Type 2 diabetes mellitus with unspecified complications: Secondary | ICD-10-CM | POA: Diagnosis not present

## 2022-06-09 DIAGNOSIS — Z794 Long term (current) use of insulin: Secondary | ICD-10-CM | POA: Diagnosis not present

## 2022-06-09 DIAGNOSIS — D75839 Thrombocytosis, unspecified: Secondary | ICD-10-CM | POA: Diagnosis not present

## 2022-06-09 DIAGNOSIS — I2510 Atherosclerotic heart disease of native coronary artery without angina pectoris: Secondary | ICD-10-CM | POA: Diagnosis not present

## 2022-06-09 DIAGNOSIS — Z86718 Personal history of other venous thrombosis and embolism: Secondary | ICD-10-CM | POA: Diagnosis not present

## 2022-06-09 DIAGNOSIS — D539 Nutritional anemia, unspecified: Secondary | ICD-10-CM | POA: Diagnosis not present

## 2022-06-11 DIAGNOSIS — G4089 Other seizures: Secondary | ICD-10-CM | POA: Diagnosis not present

## 2022-06-11 DIAGNOSIS — I252 Old myocardial infarction: Secondary | ICD-10-CM | POA: Diagnosis not present

## 2022-06-11 DIAGNOSIS — M868X8 Other osteomyelitis, other site: Secondary | ICD-10-CM | POA: Diagnosis not present

## 2022-06-11 DIAGNOSIS — D649 Anemia, unspecified: Secondary | ICD-10-CM | POA: Diagnosis not present

## 2022-06-11 DIAGNOSIS — I2510 Atherosclerotic heart disease of native coronary artery without angina pectoris: Secondary | ICD-10-CM | POA: Diagnosis not present

## 2022-06-11 DIAGNOSIS — I10 Essential (primary) hypertension: Secondary | ICD-10-CM | POA: Diagnosis not present

## 2022-06-11 DIAGNOSIS — E119 Type 2 diabetes mellitus without complications: Secondary | ICD-10-CM | POA: Diagnosis not present

## 2022-06-11 DIAGNOSIS — I82409 Acute embolism and thrombosis of unspecified deep veins of unspecified lower extremity: Secondary | ICD-10-CM | POA: Diagnosis not present

## 2022-06-11 DIAGNOSIS — M6281 Muscle weakness (generalized): Secondary | ICD-10-CM | POA: Diagnosis not present

## 2022-06-11 DIAGNOSIS — E782 Mixed hyperlipidemia: Secondary | ICD-10-CM | POA: Diagnosis not present

## 2022-06-16 ENCOUNTER — Telehealth: Payer: Self-pay

## 2022-06-16 NOTE — Telephone Encounter (Signed)
Spoke with [name] and relayed Stephanie's recommendation for further evaluation in the ED.   [name] reports that the rehab facility has now ordered a CXR, blood cultures, additional labs, a UA, and MRI.   [name] to work on Building services engineer transported to [facility] emergency department.   Beryle Flock, RN

## 2022-06-16 NOTE — Telephone Encounter (Signed)
Patient's daughter, [name], called with concerns regarding her mother. Fraser Din is currently at [facility] facility in [place], [place]. She is on dapto and ceftriaxone for septic arthritis of the right hip.   She has been experiencing fevers the last two nights (102 and 103) along with confusion and SBP on the lower end. [name] states Pat's normal SBP is in the 140s and is now in the low 100s.   Patient is on Hydrea with subsequent immunosuppression. [name] reports patient's WBC are normally 2.3-3 and is currently 8. Platelets are also elevated at 711.   Infection has been in the right hip, but Fraser Din is now complaining of left hip pain. COVID test was negative.   [name] is aware that Dr. Baxter Flattery is out right now and was advised to speak with either Janene Madeira, NP or Dr. Tommy Medal.   [name] is worried that Fraser Din is becoming septic. We discussed that fever with confusion and soft BP could indicate worsening infection and that patient may need to go to the emergency room for further evaluation. [name] to reach out to the facility to see about transportation to the ED.   Lisabeth Pick, NP at facility [phone].  Beryle Flock, RN

## 2022-06-18 DIAGNOSIS — E119 Type 2 diabetes mellitus without complications: Secondary | ICD-10-CM | POA: Diagnosis not present

## 2022-06-18 DIAGNOSIS — M868X8 Other osteomyelitis, other site: Secondary | ICD-10-CM | POA: Diagnosis not present

## 2022-06-18 DIAGNOSIS — I252 Old myocardial infarction: Secondary | ICD-10-CM | POA: Diagnosis not present

## 2022-06-18 DIAGNOSIS — I10 Essential (primary) hypertension: Secondary | ICD-10-CM | POA: Diagnosis not present

## 2022-06-18 DIAGNOSIS — I2510 Atherosclerotic heart disease of native coronary artery without angina pectoris: Secondary | ICD-10-CM | POA: Diagnosis not present

## 2022-06-18 DIAGNOSIS — D649 Anemia, unspecified: Secondary | ICD-10-CM | POA: Diagnosis not present

## 2022-06-18 DIAGNOSIS — M6281 Muscle weakness (generalized): Secondary | ICD-10-CM | POA: Diagnosis not present

## 2022-06-18 DIAGNOSIS — G4089 Other seizures: Secondary | ICD-10-CM | POA: Diagnosis not present

## 2022-06-18 DIAGNOSIS — E782 Mixed hyperlipidemia: Secondary | ICD-10-CM | POA: Diagnosis not present

## 2022-06-18 DIAGNOSIS — I82409 Acute embolism and thrombosis of unspecified deep veins of unspecified lower extremity: Secondary | ICD-10-CM | POA: Diagnosis not present

## 2022-06-19 DIAGNOSIS — M160 Bilateral primary osteoarthritis of hip: Secondary | ICD-10-CM | POA: Diagnosis not present

## 2022-06-20 ENCOUNTER — Other Ambulatory Visit: Payer: Self-pay

## 2022-06-20 ENCOUNTER — Encounter: Payer: Self-pay | Admitting: Infectious Diseases

## 2022-06-20 ENCOUNTER — Ambulatory Visit (INDEPENDENT_AMBULATORY_CARE_PROVIDER_SITE_OTHER): Payer: Medicare Other | Admitting: Infectious Diseases

## 2022-06-20 DIAGNOSIS — R509 Fever, unspecified: Secondary | ICD-10-CM

## 2022-06-20 DIAGNOSIS — M25451 Effusion, right hip: Secondary | ICD-10-CM | POA: Diagnosis not present

## 2022-06-20 NOTE — Progress Notes (Signed)
Patient: [name] [name]  DOB: [birth year] MRN: [id number] PCP: [name], MD  Referring Provider:    VIRTUAL CARE ENCOUNTER  I connected with [name] [name] on 06/20/22 at 10:00 AM EDT by TELEPHONE and verified that I am speaking with the correct person using two identifiers.   I discussed the limitations, risks, security and privacy concerns of performing an evaluation and management service by telephone and the availability of in person appointments. I also discussed with the patient that there may be a patient responsible charge related to this service. The patient expressed understanding and agreed to proceed.  Patient Location: SNF in [place], [place]  Other Participants:   Provider Location: [facility] Office    Subjective:    CC: Hospital follow up Rt hip infection.  Wants to go home.     HPI: [name] [name] is a 76 y.o. female with hx of idiopathic thrombocytosis with new onset right hip pain that was 10/10 in severity with fevers. She had noticed swelling to the inguinal canal with throbbing prior to admission. WBC 6.0 w/ 81% N (baseline 3.5K). Her imaging showed mild right hip joint effusion with right sbub-acromial and subdeltoid bursitis and mild to moderate superior right femoraacetabular OA. Underwent aspiration She underwent aspiration and synovial fluid count is bloody, no crystals, 23,6000 cells with 99%N.   I called to speak with her daughter, [name] [name] prior to call with [name] [name] in [place], [place]. 8/14 - [name] [name] called to report fevers that started on 8/12 up to 103 F with trending down of SBP to the low 100s and confusion. COVID test was obtained which was negative. After we made recommendations for further work up [employer] tells me that blood and urine cultures were negative for infection, CXR was unremarkable and the left hip xray was stable only with osteoarthritis. She had FU with Ortho yesterday and released for PRN follow up there per [name] [name]'s  understanding. Fortunately [name] [name] has remained fever free since the weekend, mentation is back to baseline. Ambulation is improving. She is very much looking forward to going home in 10 days.  (9 min)  She is ready to get home to her 2 dogs. She says that the pain has resolved in the leg, arm is quite painful from a lab draw. That  PICC line is "pinchy" from the dressing and outer part of the tube. Does not recall having any fevers earlier this week. Appetite has been "ok" - not great food choices. She has been trying to encourage herself with food choices at home.   Spoke with facility nursing team - faxed results:    ROS   Past Medical History:  Diagnosis Date   Anemia    Coronary artery disease    s/p stent to LAD and LCx   DM II (diabetes mellitus, type II), controlled ([place])    DVT (deep venous thrombosis) ([facility])    Elevated LFTs    Hyperlipidemia    Hypertension    Memory loss    Myocardial infarction ([place])    7 yrs ago    Outpatient Medications Prior to Visit  Medication Sig Dispense Refill   acetaminophen (TYLENOL) 500 MG tablet Take 2 tablets (1,000 mg total) by mouth every 8 (eight) hours as needed. 30 tablet 0   anagrelide (AGRYLIN) 0.5 MG capsule Take 1 capsule (0.5 mg total) by mouth daily. 30 capsule 2   cefTRIAXone (ROCEPHIN) IVPB Inject 2 g into the vein daily for 24  days. Indication:  Septic hip arthritis First Dose: Yes Last Day of Therapy:  06/30/22 Labs - Once weekly:  CBC/D and BMP, Labs - Every other week:  ESR and CRP Method of administration: IV Push Pull PICC line at the completion of IV therapy Method of administration may be changed at the discretion of home infusion pharmacist based upon assessment of the patient and/or caregiver's ability to self-administer the medication ordered. 24 Units 0   daptomycin (CUBICIN) IVPB Inject 400 mg into the vein daily for 24 days. Indication:  Septic hip arthritis First Dose: Yes Last Day of Therapy:  06/30/22 Labs -  Once weekly:  CBC/D, BMP, and CPK Labs - Every other week:  ESR and CRP Method of administration: IV Push Pull PICC line at the completion of IV therapy Method of administration may be changed at the discretion of home infusion pharmacist based upon assessment of the patient and/or caregiver's ability to self-administer the medication ordered. 24 Units 0   diclofenac Sodium (VOLTAREN) 1 % GEL Apply 2 g topically 4 (four) times daily.     fenofibrate micronized (LOFIBRA) 200 MG capsule TAKE ONE CAPSULE BY MOUTH ONCE DAILY BEFORE BREAKFAST 90 capsule 2   ferrous sulfate 325 (65 FE) MG EC tablet Take 325 mg by mouth daily as needed (low iron).     Fish Oil-Cholecalciferol (FISH OIL + D3) 1000-1000 MG-UNIT CAPS Take 1 tablet by mouth 3 (three) times daily.     hydroxyurea (HYDREA) 500 MG capsule TAKE 1 CAPSULE BY MOUTH ONCE DAILY. MAY TAKE WITH FOOD TO  MINIMIZE  GI  SIDE  EFFECTS. 90 capsule 2   insulin aspart (NOVOLOG) 100 UNIT/ML injection 0-15 Units, Subcutaneous, 3 times daily with meals CBG < 70: Implement Hypoglycemia measures CBG 70 - 120: 0 units CBG 121 - 150: 2 units CBG 151 - 200: 3 units CBG 201 - 250: 5 units CBG 251 - 300: 8 units CBG 301 - 350: 11 units CBG 351 - 400: 15 units CBG > 400: call MD 10 mL 11   levETIRAcetam (KEPPRA) 250 MG tablet Take 1 tablet (250 mg total) by mouth 2 (two) times daily. 180 tablet 4   metFORMIN (GLUCOPHAGE) 500 MG tablet Take 1 tablet by mouth daily.     nitroGLYCERIN (NITROSTAT) 0.4 MG SL tablet Place 1 tablet (0.4 mg total) under the tongue every 5 (five) minutes as needed. For chest pain. 25 tablet 0   omeprazole (PRILOSEC) 40 MG capsule Take 40 mg by mouth every evening.     rosuvastatin (CRESTOR) 20 MG tablet Take 10 mg by mouth every evening.     warfarin (COUMADIN) 5 MG tablet Take 5 mg by mouth See admin instructions. Take 1 tablet (5 mg) on Wednesdays and Fridays then take 1/2 tablet (2.5 mg) on Mondays, Tuesdays, Thursdays, Saturdays and Sundays      No facility-administered medications prior to visit.     Allergies  Allergen Reactions   Ampicillin Hives, Swelling and Rash   Avelox [Moxifloxacin Hcl In Nacl] Swelling   Bactrim [Sulfamethoxazole-Trimethoprim] Hives, Swelling and Rash   Ibuprofen Other (See Comments)    rash   Penicillins Other (See Comments)    rash   Diphenhydramine     Other reaction(s): rash   Sulfa Antibiotics     Other reaction(s): anaphylaxis   Codeine Rash    unknown   Latex Rash    unknown   Whey Rash    Regular dairy ok    Social  History   Tobacco Use   Smoking status: Never   Smokeless tobacco: Never  Vaping Use   Vaping Use: Never used  Substance Use Topics   Alcohol use: Yes    Comment: social    Drug use: No    Family History  Problem Relation Age of Onset   Heart attack Mother        X2   Aneurysm Mother        AAA   Stroke Mother    Heart disease Mother    Heart attack Father    Heart disease Father    Cirrhosis Father    Osteoarthritis Sister    Dementia Sister     Objective:  There were no vitals filed for this visit. There is no height or weight on file to calculate BMI.  Physical Exam  Lab Results: Lab Results  Component Value Date   WBC 5.9 06/05/2022   HGB 8.8 (L) 06/05/2022   HCT 27.0 (L) 06/05/2022   MCV 105.5 (H) 06/05/2022   PLT 700 (H) 06/05/2022    Lab Results  Component Value Date   CREATININE 0.80 06/03/2022   BUN 13 06/03/2022   NA 141 06/03/2022   K 4.0 06/03/2022   CL 108 06/03/2022   CO2 26 06/03/2022    Lab Results  Component Value Date   ALT 11 06/01/2022   AST 20 06/01/2022   ALKPHOS 34 (L) 06/01/2022   BILITOT 0.9 06/01/2022     Assessment & Plan:   Problem List Items Addressed This Visit       Unprioritized   Right hip joint effusion    Currently undergoing treatment for presumed culture negative septic arthritis of the native right hip. Aspiration without growth of any organism, WBC 23,000 99%N in medication  induced-immunocompromised host. She is doing well on IV daptomycin and ceftriaxone infusions and has not had any adverse reactions to her knowledge. There is no pain in the hip joint which she is thankful for. FU with orthopedist specialist yesterday did not yield any concerns and no surgery needed.   I placed a call to SNF and awaiting monitoring lab work but clinically it sounds as if she is doing well.   Will continue current plan with D/C of PICC 8/28 prior to leaving SNF. I have also arranged a FU with Dr. Baxter Flattery 8/29 and coordinated with her daughter [name] [name] for in person eval to ensure no further oral treatment recommended for ongoing treatment.       Fever    Fever free since 8/14 and sounds to be doing well. Blood cultures have yielded no growth preliminarily. CXR and urine was negative. Most importantly [name] [name] is feeling better and doing well. Unclear as to the cause but they will continue to follow this closely.       Follow Up Instructions: As above   I discussed the assessment and treatment plan with the patient. The patient was provided an opportunity to ask questions and all were answered. The patient agreed with the plan and demonstrated an understanding of the instructions.   The patient was advised to call back or seek an in-person evaluation if the symptoms worsen or if the condition fails to improve as anticipated.  I provided 32 minutes of non-face-to-face time during this encounter including multiple calls to family members and facility to coordinate care and follow up.    [name] Madeira, MSN, NP-C [facility] [email or web address] Pager:  [phone] Office: [place]: [phone]

## 2022-06-20 NOTE — Assessment & Plan Note (Signed)
Fever free since 8/14 and sounds to be doing well. Blood cultures have yielded no growth preliminarily. CXR and urine was negative. Most importantly [name] [name] is feeling better and doing well. Unclear as to the cause but they will continue to follow this closely.

## 2022-06-20 NOTE — Assessment & Plan Note (Signed)
Currently undergoing treatment for presumed culture negative septic arthritis of the native right hip. Aspiration without growth of any organism, WBC 23,000 99%N in medication induced-immunocompromised host. She is doing well on IV daptomycin and ceftriaxone infusions and has not had any adverse reactions to her knowledge. There is no pain in the hip joint which she is thankful for. FU with orthopedist specialist yesterday did not yield any concerns and no surgery needed.   I placed a call to SNF and awaiting monitoring lab work but clinically it sounds as if she is doing well.   Will continue current plan with D/C of PICC 8/28 prior to leaving SNF. I have also arranged a FU with Dr. Baxter Flattery 8/29 and coordinated with her daughter [name] for in person eval to ensure no further oral treatment recommended for ongoing treatment.

## 2022-06-25 NOTE — Progress Notes (Signed)
OPAT labs reviewed from SNF -   06/13/22:  WBC 7.1 (NE# wnl 5.4K) HGB 8.2 PLT 711 ESR 42 Cr 0.75 K 5.6 (this was redrawn since and pending) CK 24 CRP(HS) 1.97    06/17/22: Blood cultures no growth, preliminary at 48h x 2 WBC 6.0 Hgb 7.7 PLT 732 ESR 67 Cr 0.84 K 5.0

## 2022-06-26 ENCOUNTER — Inpatient Hospital Stay: Payer: No Typology Code available for payment source

## 2022-06-26 ENCOUNTER — Inpatient Hospital Stay: Payer: No Typology Code available for payment source | Attending: Internal Medicine

## 2022-06-26 ENCOUNTER — Inpatient Hospital Stay (HOSPITAL_BASED_OUTPATIENT_CLINIC_OR_DEPARTMENT_OTHER): Payer: No Typology Code available for payment source | Admitting: Internal Medicine

## 2022-06-26 ENCOUNTER — Other Ambulatory Visit: Payer: Self-pay | Admitting: Medical Oncology

## 2022-06-26 ENCOUNTER — Other Ambulatory Visit: Payer: Self-pay

## 2022-06-26 VITALS — BP 103/43 | HR 61 | Temp 97.7°F | Resp 15 | Wt 118.5 lb

## 2022-06-26 DIAGNOSIS — D75839 Thrombocytosis, unspecified: Secondary | ICD-10-CM | POA: Diagnosis not present

## 2022-06-26 DIAGNOSIS — D473 Essential (hemorrhagic) thrombocythemia: Secondary | ICD-10-CM

## 2022-06-26 DIAGNOSIS — D471 Chronic myeloproliferative disease: Secondary | ICD-10-CM | POA: Insufficient documentation

## 2022-06-26 DIAGNOSIS — E119 Type 2 diabetes mellitus without complications: Secondary | ICD-10-CM | POA: Diagnosis not present

## 2022-06-26 DIAGNOSIS — D649 Anemia, unspecified: Secondary | ICD-10-CM | POA: Diagnosis not present

## 2022-06-26 DIAGNOSIS — I10 Essential (primary) hypertension: Secondary | ICD-10-CM | POA: Insufficient documentation

## 2022-06-26 DIAGNOSIS — D508 Other iron deficiency anemias: Secondary | ICD-10-CM

## 2022-06-26 LAB — CBC WITH DIFFERENTIAL (CANCER CENTER ONLY)
Abs Immature Granulocytes: 0.09 10*3/uL — ABNORMAL HIGH (ref 0.00–0.07)
Basophils Absolute: 0.1 10*3/uL (ref 0.0–0.1)
Basophils Relative: 1 %
Eosinophils Absolute: 0.1 10*3/uL (ref 0.0–0.5)
Eosinophils Relative: 1 %
HCT: 23.8 % — ABNORMAL LOW (ref 36.0–46.0)
Hemoglobin: 7.5 g/dL — ABNORMAL LOW (ref 12.0–15.0)
Immature Granulocytes: 1 %
Lymphocytes Relative: 9 %
Lymphs Abs: 0.7 10*3/uL (ref 0.7–4.0)
MCH: 32.9 pg (ref 26.0–34.0)
MCHC: 31.5 g/dL (ref 30.0–36.0)
MCV: 104.4 fL — ABNORMAL HIGH (ref 80.0–100.0)
Monocytes Absolute: 0.2 10*3/uL (ref 0.1–1.0)
Monocytes Relative: 3 %
Neutro Abs: 6.4 10*3/uL (ref 1.7–7.7)
Neutrophils Relative %: 85 %
Platelet Count: 887 10*3/uL — ABNORMAL HIGH (ref 150–400)
RBC: 2.28 MIL/uL — ABNORMAL LOW (ref 3.87–5.11)
RDW: 20.5 % — ABNORMAL HIGH (ref 11.5–15.5)
WBC Count: 7.6 10*3/uL (ref 4.0–10.5)
nRBC: 0 % (ref 0.0–0.2)

## 2022-06-26 LAB — CMP (CANCER CENTER ONLY)
ALT: 9 U/L (ref 0–44)
AST: 16 U/L (ref 15–41)
Albumin: 3.7 g/dL (ref 3.5–5.0)
Alkaline Phosphatase: 62 U/L (ref 38–126)
Anion gap: 7 (ref 5–15)
BUN: 10 mg/dL (ref 8–23)
CO2: 28 mmol/L (ref 22–32)
Calcium: 9 mg/dL (ref 8.9–10.3)
Chloride: 103 mmol/L (ref 98–111)
Creatinine: 0.68 mg/dL (ref 0.44–1.00)
GFR, Estimated: >60 mL/min (ref >60)
Glucose, Bld: 168 mg/dL — ABNORMAL HIGH (ref 70–99)
Potassium: 4.6 mmol/L (ref 3.5–5.1)
Sodium: 138 mmol/L (ref 135–145)
Total Bilirubin: 0.3 mg/dL (ref 0.3–1.2)
Total Protein: 6 g/dL — ABNORMAL LOW (ref 6.5–8.1)

## 2022-06-26 LAB — LACTATE DEHYDROGENASE: LDH: 474 U/L — ABNORMAL HIGH (ref 98–192)

## 2022-06-26 NOTE — Progress Notes (Signed)
[place] [phone]   [phone]  OFFICE PROGRESS NOTE  [name], MD [place] [address]  DIAGNOSIS: Essential thrombocythemia with positive JAK2 mutation [id number] diagnosed in October 2020.  PRIOR THERAPY: None  CURRENT THERAPY: Hydroxyurea 1000 mg p.o. daily in addition to anagrelide 0.5 mg p.o. daily.   INTERVAL HISTORY: [name] [name] 76 y.o. female returns to the clinic today for follow-up visit accompanied by her daughter [name].  The patient continues to complain of increasing fatigue and weakness.  She was recently diagnosed with septic arthritis and currently on treatment with antibiotics with Rocephin and daptomycin.  She has a PICC line and currently receiving her treatment in a skilled nursing facility.  She denied having any current chest pain, shortness of breath, cough or hemoptysis.  She has no nausea, vomiting, diarrhea or constipation.  She has no headache or visual changes.  She has been on her treatment with hydroxyurea but likely only 500 mg p.o. daily when she went to the facility.  She also on anagrelide 0.5 mg p.o. daily.  She is here today for evaluation and repeat blood work.   MEDICAL HISTORY: Past Medical History:  Diagnosis Date   Anemia    Coronary artery disease    s/p stent to LAD and LCx   DM II (diabetes mellitus, type II), controlled ([facility])    DVT (deep venous thrombosis) ([facility])    Elevated LFTs    Hyperlipidemia    Hypertension    Memory loss    Myocardial infarction ([place])    7 yrs ago    ALLERGIES:  is allergic to ampicillin, avelox [moxifloxacin hcl in nacl], bactrim [sulfamethoxazole-trimethoprim], ibuprofen, penicillins, diphenhydramine, sulfa antibiotics, codeine, latex, and whey.  MEDICATIONS:  Current Outpatient Medications  Medication Sig Dispense Refill   acetaminophen (TYLENOL) 500 MG tablet Take 2 tablets (1,000 mg total) by mouth every 8 (eight) hours as needed. 30  tablet 0   anagrelide (AGRYLIN) 0.5 MG capsule Take 1 capsule (0.5 mg total) by mouth daily. 30 capsule 2   cefTRIAXone (ROCEPHIN) IVPB Inject 2 g into the vein daily for 24 days. Indication:  Septic hip arthritis First Dose: Yes Last Day of Therapy:  06/30/22 Labs - Once weekly:  CBC/D and BMP, Labs - Every other week:  ESR and CRP Method of administration: IV Push Pull PICC line at the completion of IV therapy Method of administration may be changed at the discretion of home infusion pharmacist based upon assessment of the patient and/or caregiver's ability to self-administer the medication ordered. 24 Units 0   daptomycin (CUBICIN) IVPB Inject 400 mg into the vein daily for 24 days. Indication:  Septic hip arthritis First Dose: Yes Last Day of Therapy:  06/30/22 Labs - Once weekly:  CBC/D, BMP, and CPK Labs - Every other week:  ESR and CRP Method of administration: IV Push Pull PICC line at the completion of IV therapy Method of administration may be changed at the discretion of home infusion pharmacist based upon assessment of the patient and/or caregiver's ability to self-administer the medication ordered. 24 Units 0   diclofenac Sodium (VOLTAREN) 1 % GEL Apply 2 g topically 4 (four) times daily.     fenofibrate micronized (LOFIBRA) 200 MG capsule TAKE ONE CAPSULE BY MOUTH ONCE DAILY BEFORE BREAKFAST 90 capsule 2   ferrous sulfate 325 (65 FE) MG EC tablet Take 325 mg by mouth daily as needed (low iron).  Fish Oil-Cholecalciferol (FISH OIL + D3) 1000-1000 MG-UNIT CAPS Take 1 tablet by mouth 3 (three) times daily.     hydroxyurea (HYDREA) 500 MG capsule TAKE 1 CAPSULE BY MOUTH ONCE DAILY. MAY TAKE WITH FOOD TO  MINIMIZE  GI  SIDE  EFFECTS. 90 capsule 2   insulin aspart (NOVOLOG) 100 UNIT/ML injection 0-15 Units, Subcutaneous, 3 times daily with meals CBG < 70: Implement Hypoglycemia measures CBG 70 - 120: 0 units CBG 121 - 150: 2 units CBG 151 - 200: 3 units CBG 201 - 250: 5 units CBG 251  - 300: 8 units CBG 301 - 350: 11 units CBG 351 - 400: 15 units CBG > 400: call MD 10 mL 11   levETIRAcetam (KEPPRA) 250 MG tablet Take 1 tablet (250 mg total) by mouth 2 (two) times daily. 180 tablet 4   metFORMIN (GLUCOPHAGE) 500 MG tablet Take 1 tablet by mouth daily.     nitroGLYCERIN (NITROSTAT) 0.4 MG SL tablet Place 1 tablet (0.4 mg total) under the tongue every 5 (five) minutes as needed. For chest pain. 25 tablet 0   omeprazole (PRILOSEC) 40 MG capsule Take 40 mg by mouth every evening.     rosuvastatin (CRESTOR) 20 MG tablet Take 10 mg by mouth every evening.     warfarin (COUMADIN) 5 MG tablet Take 5 mg by mouth See admin instructions. Take 1 tablet (5 mg) on Wednesdays and Fridays then take 1/2 tablet (2.5 mg) on Mondays, Tuesdays, Thursdays, Saturdays and Sundays     No current facility-administered medications for this visit.    SURGICAL HISTORY:  Past Surgical History:  Procedure Laterality Date   CATARACT EXTRACTION     CESAREAN SECTION  1983   CORONARY ANGIOPLASTY WITH STENT PLACEMENT  04/26/2003   NORMAL. EF 65%   I & D EXTREMITY  06/22/2012   Procedure: IRRIGATION AND DEBRIDEMENT EXTREMITY;  Surgeon: Tennis Must, MD;  Location: [facility];  Service: Orthopedics;  Laterality: Left;   I & D EXTREMITY  06/29/2012   Procedure: IRRIGATION AND DEBRIDEMENT EXTREMITY;  Surgeon: Tennis Must, MD;  Location: [facility];  Service: Orthopedics;  Laterality: Left;   OPEN REDUCTION INTERNAL FIXATION (ORIF) DISTAL RADIAL FRACTURE Right 02/04/2018   Procedure: OPEN REDUCTION INTERNAL FIXATION (ORIF) RIGHT DISTAL RADIAL FRACTURE;  Surgeon: Leanora Cover, MD;  Location: [facility];  Service: Orthopedics;  Laterality: Right;    REVIEW OF SYSTEMS:  A comprehensive review of systems was negative except for: Constitutional: positive for fatigue   PHYSICAL EXAMINATION: General appearance: alert, cooperative, fatigued, and no distress Head: Normocephalic, without obvious abnormality,  atraumatic Neck: no adenopathy, no JVD, supple, symmetrical, trachea midline, and thyroid not enlarged, symmetric, no tenderness/mass/nodules Lymph nodes: Cervical, supraclavicular, and axillary nodes normal. Resp: clear to auscultation bilaterally Back: symmetric, no curvature. ROM normal. No CVA tenderness. Cardio: regular rate and rhythm, S1, S2 normal, no murmur, click, rub or gallop GI: soft, non-tender; bowel sounds normal; no masses,  no organomegaly Extremities: extremities normal, atraumatic, no cyanosis or edema  ECOG PERFORMANCE STATUS: 1 - Symptomatic but completely ambulatory  Blood pressure (!) 103/43, pulse 61, temperature 97.7 F (36.5 C), temperature source Oral, resp. rate 15, weight 118 lb 8 oz (53.8 kg), SpO2 98 %.  LABORATORY DATA: Lab Results  Component Value Date   WBC 7.6 06/26/2022   HGB 7.5 (L) 06/26/2022   HCT 23.8 (L) 06/26/2022   MCV 104.4 (H) 06/26/2022   PLT 887 (H) 06/26/2022  Chemistry      Component Value Date/Time   NA 138 06/26/2022 1049   NA 141 01/29/2021 1107   K 4.6 06/26/2022 1049   CL 103 06/26/2022 1049   CO2 28 06/26/2022 1049   BUN 10 06/26/2022 1049   BUN 17 01/29/2021 1107   CREATININE 0.68 06/26/2022 1049      Component Value Date/Time   CALCIUM 9.0 06/26/2022 1049   ALKPHOS 62 06/26/2022 1049   AST 16 06/26/2022 1049   ALT 9 06/26/2022 1049   BILITOT 0.3 06/26/2022 1049       RADIOGRAPHIC STUDIES: [place] EKG SITE RITE  Result Date: 06/05/2022 If Site Rite image not attached, placement could not be confirmed due to current cardiac rhythm.  VAS [place] LOWER EXTREMITY VENOUS (DVT) (ONLY [facility] & [facility])  Result Date: 06/03/2022  Lower Venous DVT Study Patient Name:  NYARA CAPELL  Date of Exam:   06/02/2022 Medical Rec #: [id number]         Accession #:    [id number] Date of Birth: December 21, 1945         Patient Gender: F Patient Age:   17 years Exam Location:  [facility] Procedure:      VAS [place] LOWER EXTREMITY VENOUS (DVT)  Referring Phys: Judd Gaudier --------------------------------------------------------------------------------  Indications: Pain (hip and groin). Other Indications: Severe degenerative changes of the right hip. Comparison Study: Prior bilateral LEV done 07/07/14 indicating DVT in the                   bilateral lower extremities. Performing Technologist: Sharion Dove RVS  Examination Guidelines: A complete evaluation includes B-mode imaging, spectral Doppler, color Doppler, and power Doppler as needed of all accessible portions of each vessel. Bilateral testing is considered an integral part of a complete examination. Limited examinations for reoccurring indications may be performed as noted. The reflux portion of the exam is performed with the patient in reverse Trendelenburg.  +---------+---------------+---------+-----------+----------+-------------------+ RIGHT    CompressibilityPhasicitySpontaneityPropertiesThrombus Aging      +---------+---------------+---------+-----------+----------+-------------------+ CFV      Full           Yes      Yes                                      +---------+---------------+---------+-----------+----------+-------------------+ SFJ      Full                                                             +---------+---------------+---------+-----------+----------+-------------------+ FV Prox  Full                                                             +---------+---------------+---------+-----------+----------+-------------------+ FV Mid   Full                                                             +---------+---------------+---------+-----------+----------+-------------------+  FV DistalFull                                                             +---------+---------------+---------+-----------+----------+-------------------+ PFV      Full                                                              +---------+---------------+---------+-----------+----------+-------------------+ POP                     Yes      Yes                  patent by color and                                                       Doppler             +---------+---------------+---------+-----------+----------+-------------------+ PTV      Full                                                             +---------+---------------+---------+-----------+----------+-------------------+ PERO     Full                                                             +---------+---------------+---------+-----------+----------+-------------------+   +----+---------------+---------+-----------+----------+--------------+ LEFTCompressibilityPhasicitySpontaneityPropertiesThrombus Aging +----+---------------+---------+-----------+----------+--------------+ CFV Full           Yes      Yes                                 +----+---------------+---------+-----------+----------+--------------+     Summary: RIGHT: - There is no evidence of deep vein thrombosis in the lower extremity.  - No cystic structure found in the popliteal fossa.  LEFT: - No evidence of common femoral vein obstruction.  *See table(s) above for measurements and observations. Electronically signed by Orlie Pollen on 06/03/2022 at 12:45:19 PM.    Final    DG FLUORO GUIDED NEEDLE PLC ASPIRATION/INJECTION LOC  Result Date: 06/02/2022 CLINICAL DATA:  Right hip pain. EXAM: RIGHT HIP INJECTION UNDER FLUOROSCOPY COMPARISON:  Right hip radiographs and MRI 06/01/2022 FLUOROSCOPY: Radiation Exposure Index (as provided by the fluoroscopic device): 3.10 mGy Kerma PROCEDURE: Telephone consent was obtained from the patient's daughter. Risks and benefits of the procedure were discussed. We were initially asked to perform a right hip aspiration, however MRI demonstrated only a very small joint effusion. A request was later made for a therapeutic intra-articular  steroid injection. A time-out  was performed prior to the start of the procedure. Overlying skin prepped with Betadine, draped in the usual sterile fashion, and infiltrated locally with 1% lidocaine. A 22 gauge spinal needle advanced to the lateral aspect of the right femoral head-neck junction. When the stylet was removed, there was spontaneous return of blood-tinged synovial fluid. Approximately 2 mL of this fluid was collected and sent to the lab. Diagnostic injection of iodinated contrast demonstrated intra-articular spread without intravascular component. 80 mg Depo-Medrol and 5 ml Sensorcaine 0.5% were then administered. No immediate complication. IMPRESSION: Technically successful right hip injection under fluoroscopy. Read by: [name], NP Electronically Signed   By: Logan Bores M.D.   On: 06/02/2022 15:18   DG Chest Portable 1 View  Result Date: 06/02/2022 CLINICAL DATA:  Altered mental status. EXAM: PORTABLE CHEST 1 VIEW COMPARISON:  Chest radiograph dated 08/14/2021. FINDINGS: Background of emphysema and chronic interstitial coarsening. Diffuse hazy interstitial densities throughout the lungs, right greater left, new since the prior radiograph and may represent mild edema. Atypical pneumonia is not excluded clinical correlation is recommended. No consolidative changes. There is no pleural effusion pneumothorax. Stable cardiac silhouette. Atherosclerotic calcification of the aorta. No acute osseous pathology. IMPRESSION: Diffuse hazy interstitial densities throughout the lungs, right greater left may represent mild edema. Atypical pneumonia is not excluded. Electronically Signed   By: Anner Crete M.D.   On: 06/02/2022 00:13   MR HIP RIGHT W WO CONTRAST  Result Date: 06/01/2022 CLINICAL DATA:  Hip pain.  Osteonecrosis suspected. EXAM: MRI OF THE RIGHT HIP WITHOUT AND WITH CONTRAST TECHNIQUE: Multiplanar, multisequence MR imaging was performed both before and after administration of  intravenous contrast. CONTRAST:  5.26m GADAVIST GADOBUTROL 1 MMOL/ML IV SOLN COMPARISON:  Pelvis and right hip radiographs 06/01/2022. FINDINGS: Despite efforts by the technologist and patient, moderate to high-grade motion artifact is present on today's exam and could not be eliminated. This reduces exam sensitivity and specificity. Bones: No acute fracture or avascular necrosis is seen within limitations of the motion artifact. Mild pubic symphysis joint space narrowing and peripheral osteophytosis. Articular cartilage and labrum Articular cartilage: Within the limitations of patient motion artifact, there are mild subchondral cystic changes within the superolateral aspect of the humeral head. Likely mild superior femoral head and acetabular cartilage thinning. Labrum: Motion artifact limits evaluation. Suspected anterior superior acetabular labrum degenerative tears. Joint or bursal effusion Joint effusion: Mild right hip joint effusion. No left hip joint effusion. Bursae: Mild fluid within the right subacromial/subdeltoid bursa. Muscles and tendons Muscles and tendons: The origins of the bilateral common hamstring tendons and the rectus femoris tendons are intact. Within the limitations of motion artifact, no muscle tear is seen. Other findings Miscellaneous:   None. IMPRESSION: 1. Unfortunately, motion artifact mildly to markedly technically degrades this examination. 2. No definite avascular necrosis is seen. 3. Mild right hip joint effusion. Superior right acetabular subchondral degenerative cystic change with mild-to-moderate superior right femoroacetabular osteoarthritis. 4. Mild right subacromial/subdeltoid bursitis. Electronically Signed   By: RYvonne KendallM.D.   On: 06/01/2022 22:06   DG Hip Unilat W or Wo Pelvis 2-3 Views Right  Result Date: 06/01/2022 CLINICAL DATA:  Right hip pain EXAM: DG HIP (WITH OR WITHOUT PELVIS) 2-3V RIGHT COMPARISON:  07/13/2014 FINDINGS: Severe degenerative changes of the  right hip joint, progressed from 2015. No evidence of fracture or dislocation. No erosion or periosteal elevation is seen. No appreciable soft tissue abnormality. IMPRESSION: Severe degenerative changes of the right hip, progressed from 2015. Electronically  Signed   By: Davina Poke D.O.   On: 06/01/2022 14:49    ASSESSMENT AND PLAN: This is a very pleasant 76 years old white female recently diagnosed with essential thrombocythemia with positive JAK2 mutation.  The patient also has anemia and leukocytosis secondary to the myeloproliferative disorder. The patient was recently on hydroxyurea 500 mg p.o. daily in addition to anagrelide 0.5 mg p.o. daily. Repeat CBC today showed persistent anemia with hemoglobin of 7.5 and platelets count increased to 887,000. I recommended for the patient to increase her dose of hydroxyurea to 1000 mg p.o. daily in addition to anagrelide 0.5 mg p.o. daily. For the anemia, we will arrange for the patient to receive 2 units of PRBCs transfusion in the next few days. I will see her back for follow-up visit in 1 months for evaluation and repeat blood work. She was advised to call immediately if she has any concerning symptoms in the interval. The patient voices understanding of current disease status and treatment options and is in agreement with the current care plan.  All questions were answered. The patient knows to call the clinic with any problems, questions or concerns. We can certainly see the patient much sooner if necessary.  Disclaimer: This note was dictated with voice recognition software. Similar sounding words can inadvertently be transcribed and may not be corrected upon review.

## 2022-06-27 ENCOUNTER — Telehealth: Payer: Self-pay

## 2022-06-27 ENCOUNTER — Inpatient Hospital Stay: Payer: No Typology Code available for payment source

## 2022-06-27 LAB — PREPARE RBC (CROSSMATCH)

## 2022-06-30 ENCOUNTER — Telehealth: Payer: Self-pay | Admitting: Cardiovascular Disease

## 2022-06-30 LAB — TYPE AND SCREEN
ABO/RH(D): A POS
Antibody Screen: NEGATIVE
Unit division: 0

## 2022-06-30 LAB — BPAM RBC
Blood Product Expiration Date: 202309202359
Unit Type and Rh: 6200

## 2022-06-30 NOTE — Telephone Encounter (Signed)
  Pt's daughter called, she would like to inform Dr. Acie Fredrickson that pt passed away last 12-Feb-2023. She wanted to thank Dr. Acie Fredrickson for the care he provided to the pt.

## 2022-06-30 NOTE — Telephone Encounter (Signed)
Spoke with daughter to let her know will forward message to Dr Acie Fredrickson  and condolences was given .Adonis Housekeeper

## 2022-07-01 ENCOUNTER — Inpatient Hospital Stay: Payer: Medicare Other | Admitting: Internal Medicine

## 2022-07-04 NOTE — Telephone Encounter (Signed)
Call received from pts daughter advising pt passed away this morning. I have cancelled her appts here and extended our condolences to the family.

## 2022-07-04 DEATH — deceased

## 2022-07-29 ENCOUNTER — Ambulatory Visit: Payer: Medicare Other | Admitting: Internal Medicine

## 2022-07-29 ENCOUNTER — Other Ambulatory Visit: Payer: Medicare Other

## 2022-10-16 ENCOUNTER — Telehealth: Payer: Self-pay | Admitting: Cardiovascular Disease

## 2022-10-16 NOTE — Telephone Encounter (Signed)
[name] [name] with Release point from [employer] called stating their medical release form is missing some information and they need to annotate it. Please advise    Reference number: [id number]

## 2022-10-16 NOTE — Telephone Encounter (Signed)
Will route this message to our HIM dept to further assist with this matter.

## 2022-11-22 IMAGING — MG MM DIGITAL SCREENING BILAT W/ TOMO AND CAD
8 series · 9 of 24 positions shown · non-contrast
Comparison: Previous exam(s).

CLINICAL DATA: Screening.

EXAM:
DIGITAL SCREENING BILATERAL MAMMOGRAM WITH TOMOSYNTHESIS AND CAD
TECHNIQUE: Bilateral screening digital craniocaudal and mediolateral oblique
mammograms were obtained. Bilateral screening digital breast
tomosynthesis was performed. The images were evaluated with
computer-aided detection.

[L MLO synth-2D]
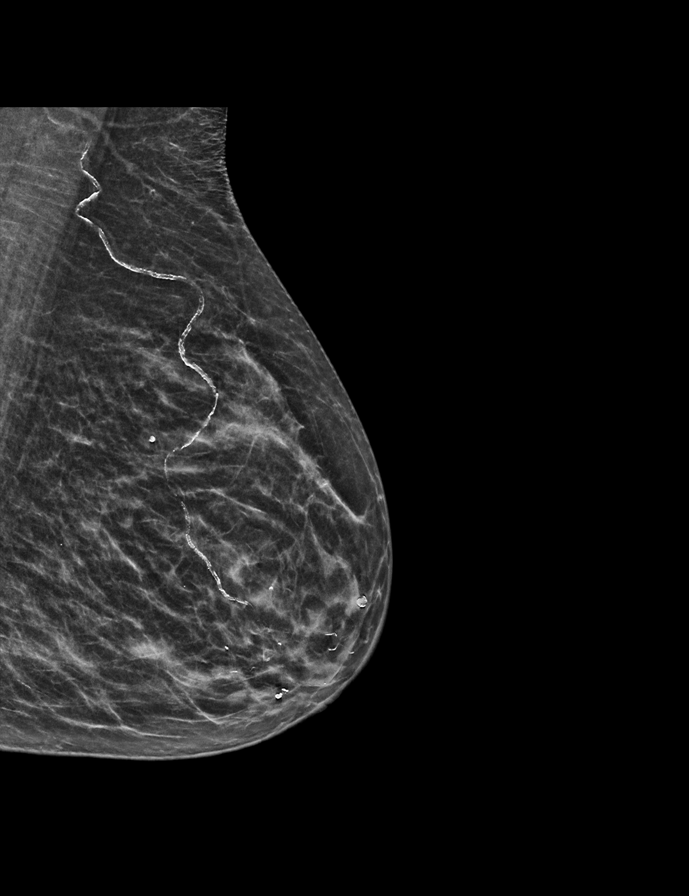

[R MLO synth-2D]
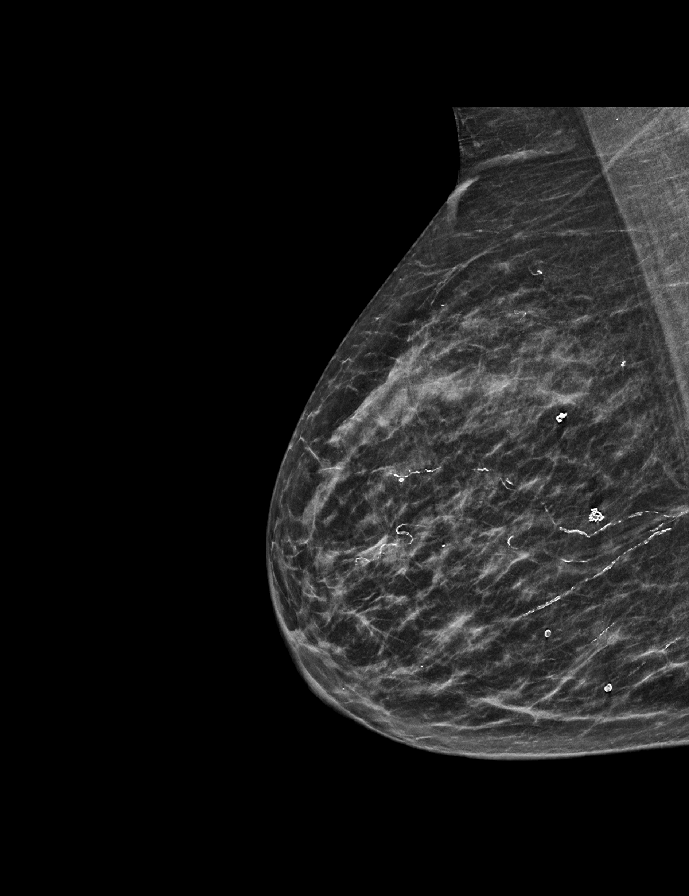

[L CC synth-2D]
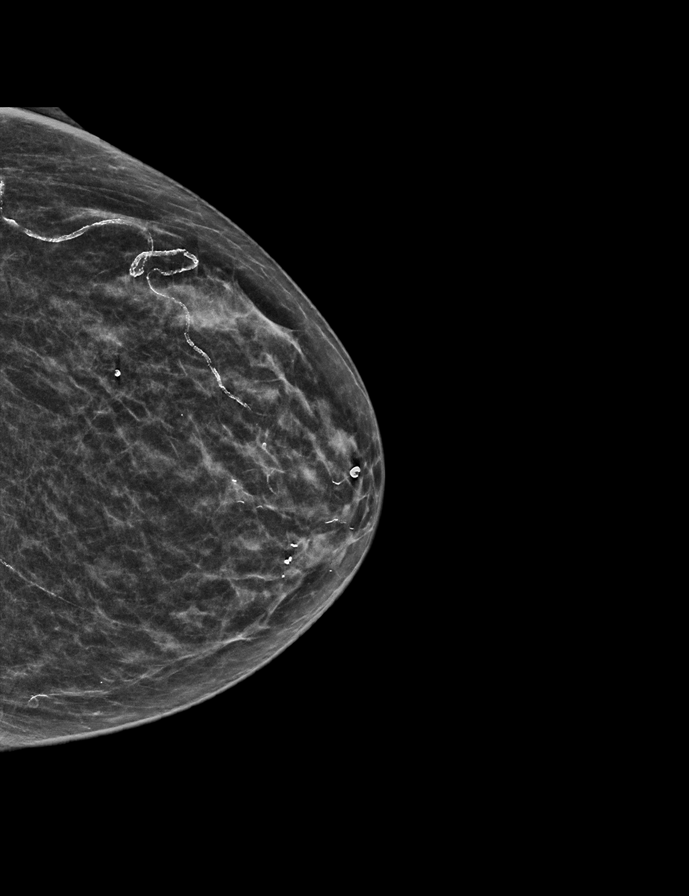

[R CC synth-2D]
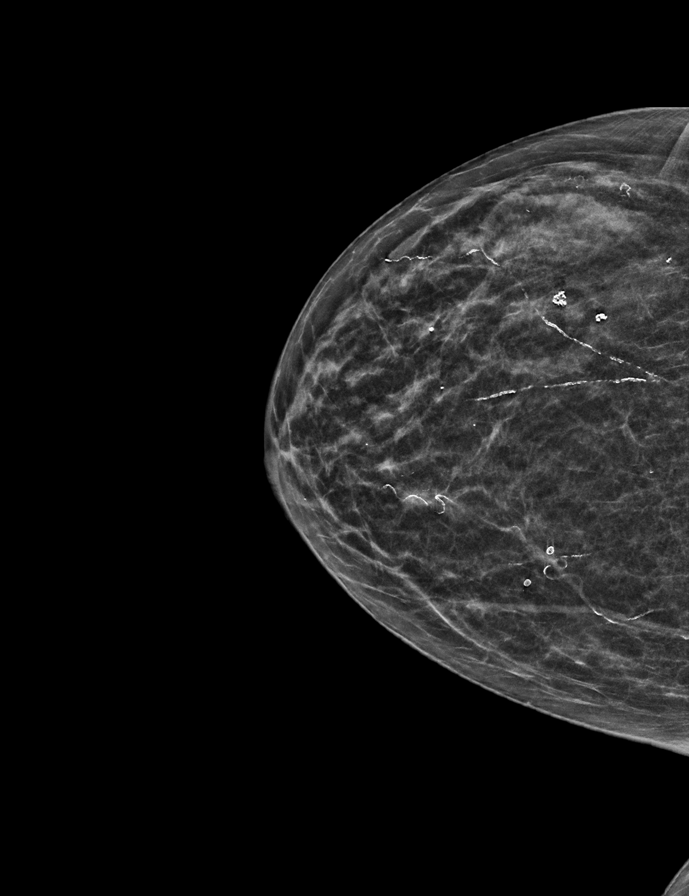

[L MLO tomo · 2 of 45 frames shown]
[frame 15/45]
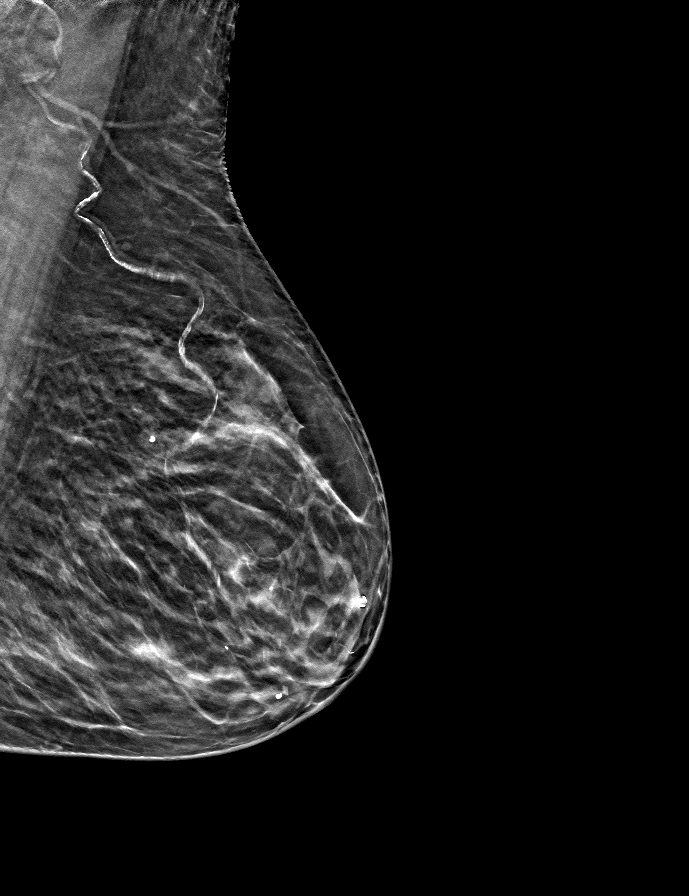
[frame 23/45]
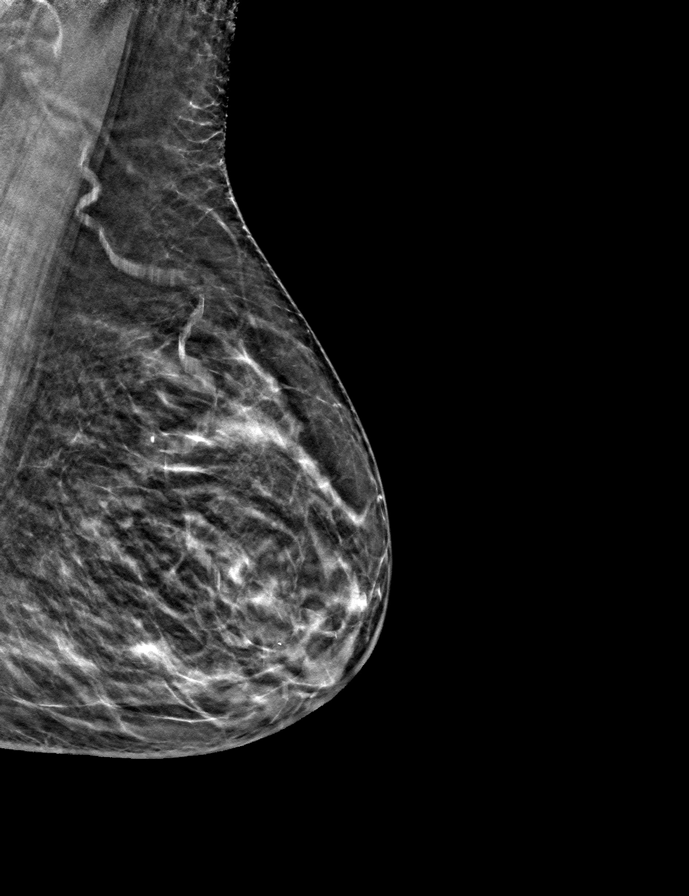

[R MLO tomo · tomo slice 23/44.0]
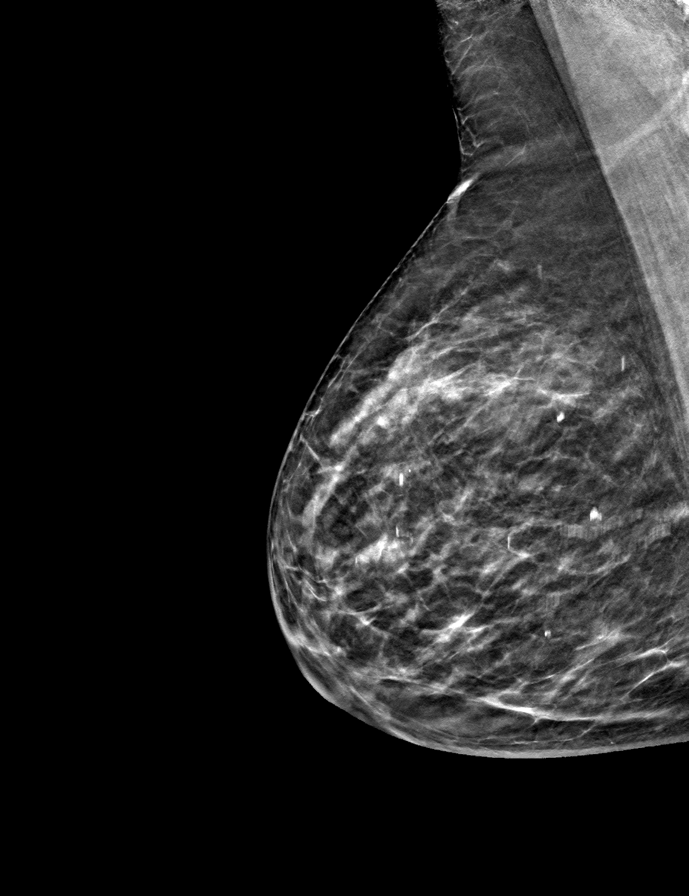

[R CC tomo · tomo slice 22/43.0]
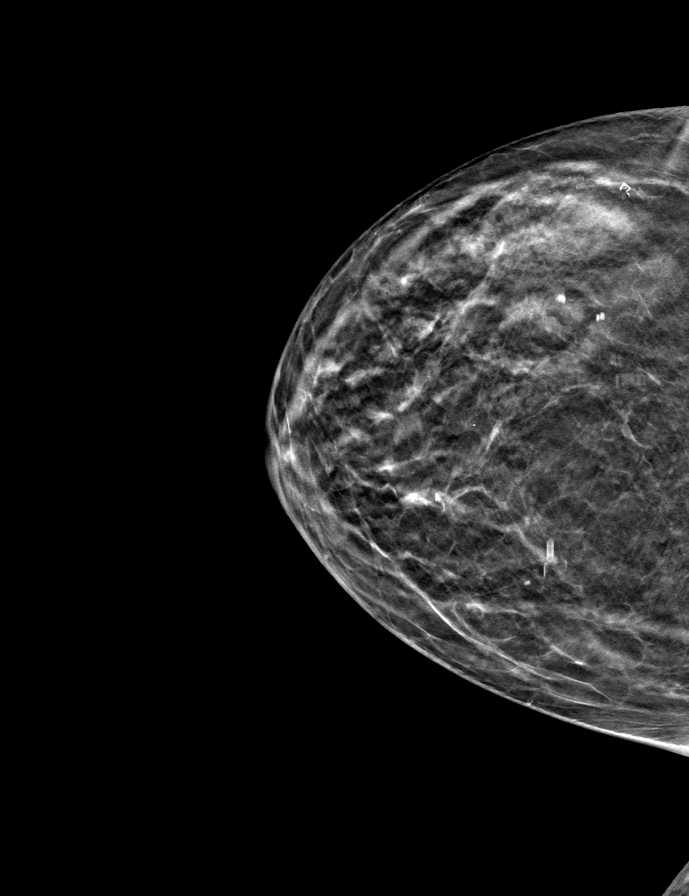

[L CC tomo · tomo slice 23/46.0]
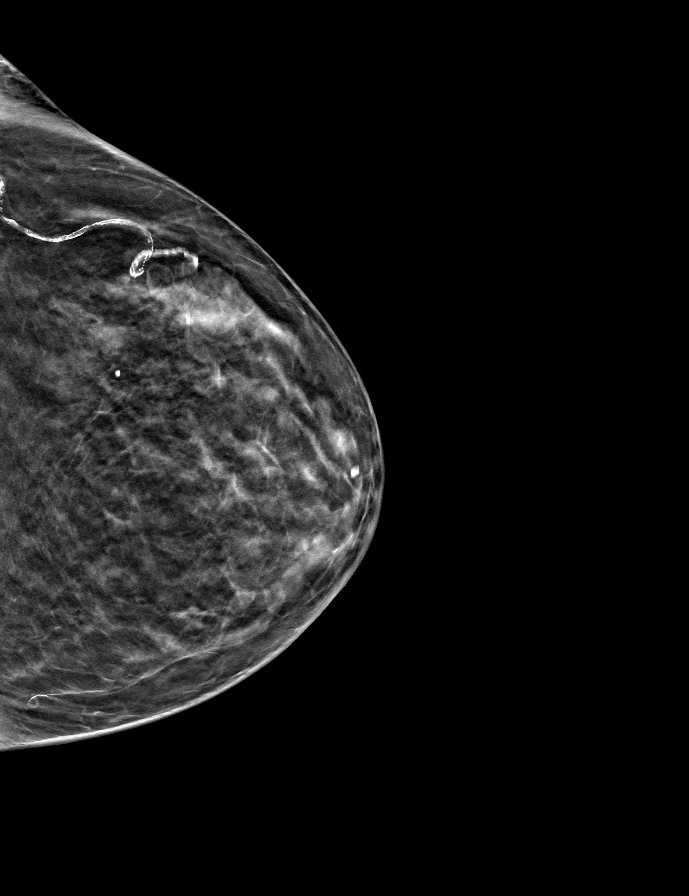

[9 of 24 positions shown; findings below may reference images not displayed]

ACR Breast Density Category c: The breast tissue is heterogeneously
dense, which may obscure small masses.
FINDINGS: There are no findings suspicious for malignancy. The images were
evaluated with computer-aided detection.
IMPRESSION: No mammographic evidence of malignancy. A result letter of this
screening mammogram will be mailed directly to the patient.

RECOMMENDATION:
Screening mammogram in one year. (Code:T4-5-GWO)

BI-RADS CATEGORY  1: Negative.

## 2023-01-15 ENCOUNTER — Ambulatory Visit: Payer: Medicare Other | Admitting: Neurology
